# Patient Record
Sex: Male | Born: 1960 | Race: Black or African American | Hispanic: No | Marital: Single | Smoking: Former smoker
Health system: Southern US, Community
[De-identification: ages and names within clinical notes are randomized; demographics above are authoritative.]

## PROBLEM LIST (undated history)

## (undated) DIAGNOSIS — A159 Respiratory tuberculosis unspecified: Secondary | ICD-10-CM

## (undated) DIAGNOSIS — M1990 Unspecified osteoarthritis, unspecified site: Secondary | ICD-10-CM

## (undated) DIAGNOSIS — E669 Obesity, unspecified: Secondary | ICD-10-CM

## (undated) DIAGNOSIS — F17200 Nicotine dependence, unspecified, uncomplicated: Secondary | ICD-10-CM

## (undated) DIAGNOSIS — F1910 Other psychoactive substance abuse, uncomplicated: Secondary | ICD-10-CM

## (undated) DIAGNOSIS — G709 Myoneural disorder, unspecified: Secondary | ICD-10-CM

## (undated) DIAGNOSIS — R51 Headache: Secondary | ICD-10-CM

## (undated) DIAGNOSIS — E785 Hyperlipidemia, unspecified: Secondary | ICD-10-CM

## (undated) DIAGNOSIS — G63 Polyneuropathy in diseases classified elsewhere: Secondary | ICD-10-CM

## (undated) DIAGNOSIS — J45909 Unspecified asthma, uncomplicated: Secondary | ICD-10-CM

## (undated) DIAGNOSIS — I10 Essential (primary) hypertension: Secondary | ICD-10-CM

## (undated) DIAGNOSIS — K219 Gastro-esophageal reflux disease without esophagitis: Secondary | ICD-10-CM

## (undated) DIAGNOSIS — M6530 Trigger finger, unspecified finger: Secondary | ICD-10-CM

## (undated) HISTORY — DX: Myoneural disorder, unspecified: G70.9

## (undated) HISTORY — DX: Hyperlipidemia, unspecified: E78.5

## (undated) HISTORY — DX: Respiratory tuberculosis unspecified: A15.9

## (undated) HISTORY — DX: Other psychoactive substance abuse, uncomplicated: F19.10

## (undated) HISTORY — DX: Polyneuropathy in diseases classified elsewhere: G63

---

## 1999-07-16 ENCOUNTER — Encounter: Admission: RE | Admit: 1999-07-16 | Discharge: 1999-07-16 | Payer: Self-pay | Admitting: Family Medicine

## 1999-08-07 ENCOUNTER — Encounter: Admission: RE | Admit: 1999-08-07 | Discharge: 1999-08-07 | Payer: Self-pay | Admitting: Sports Medicine

## 1999-08-13 ENCOUNTER — Ambulatory Visit (HOSPITAL_COMMUNITY): Admission: RE | Admit: 1999-08-13 | Discharge: 1999-08-13 | Payer: Self-pay | Admitting: *Deleted

## 1999-08-13 ENCOUNTER — Encounter: Payer: Self-pay | Admitting: *Deleted

## 2004-11-03 ENCOUNTER — Emergency Department (HOSPITAL_COMMUNITY): Admission: EM | Admit: 2004-11-03 | Discharge: 2004-11-03 | Payer: Self-pay | Admitting: Family Medicine

## 2005-03-09 ENCOUNTER — Inpatient Hospital Stay (HOSPITAL_COMMUNITY): Admission: AC | Admit: 2005-03-09 | Discharge: 2005-03-12 | Payer: Self-pay

## 2006-03-11 ENCOUNTER — Emergency Department (HOSPITAL_COMMUNITY): Admission: EM | Admit: 2006-03-11 | Discharge: 2006-03-11 | Payer: Self-pay | Admitting: Family Medicine

## 2007-03-27 ENCOUNTER — Emergency Department (HOSPITAL_COMMUNITY): Admission: EM | Admit: 2007-03-27 | Discharge: 2007-03-27 | Payer: Self-pay | Admitting: Family Medicine

## 2007-03-31 ENCOUNTER — Emergency Department (HOSPITAL_COMMUNITY): Admission: EM | Admit: 2007-03-31 | Discharge: 2007-03-31 | Payer: Self-pay | Admitting: Emergency Medicine

## 2007-05-04 ENCOUNTER — Ambulatory Visit: Payer: Self-pay | Admitting: Family Medicine

## 2007-05-04 ENCOUNTER — Encounter (INDEPENDENT_AMBULATORY_CARE_PROVIDER_SITE_OTHER): Payer: Self-pay | Admitting: Nurse Practitioner

## 2007-05-05 ENCOUNTER — Encounter: Payer: Self-pay | Admitting: Nurse Practitioner

## 2007-05-05 ENCOUNTER — Encounter (INDEPENDENT_AMBULATORY_CARE_PROVIDER_SITE_OTHER): Payer: Self-pay | Admitting: Nurse Practitioner

## 2007-05-05 DIAGNOSIS — Z87448 Personal history of other diseases of urinary system: Secondary | ICD-10-CM | POA: Insufficient documentation

## 2007-05-05 DIAGNOSIS — S6980XA Other specified injuries of unspecified wrist, hand and finger(s), initial encounter: Secondary | ICD-10-CM | POA: Insufficient documentation

## 2007-05-05 DIAGNOSIS — S6990XA Unspecified injury of unspecified wrist, hand and finger(s), initial encounter: Secondary | ICD-10-CM | POA: Insufficient documentation

## 2007-05-05 DIAGNOSIS — K219 Gastro-esophageal reflux disease without esophagitis: Secondary | ICD-10-CM

## 2007-05-05 DIAGNOSIS — M6530 Trigger finger, unspecified finger: Secondary | ICD-10-CM

## 2007-05-05 HISTORY — DX: Gastro-esophageal reflux disease without esophagitis: K21.9

## 2007-05-05 HISTORY — DX: Trigger finger, unspecified finger: M65.30

## 2007-05-05 LAB — CONVERTED CEMR LAB
ALT: 18 units/L (ref 0–53)
AST: 17 units/L (ref 0–37)
BUN: 11 mg/dL (ref 6–23)
CO2: 26 meq/L (ref 19–32)
Eosinophils Absolute: 0.2 10*3/uL (ref 0.0–0.7)
Eosinophils Relative: 2 % (ref 0–5)
Glucose, Bld: 111 mg/dL — ABNORMAL HIGH (ref 70–99)
MCHC: 30.5 g/dL (ref 30.0–36.0)
MCV: 92.6 fL (ref 78.0–100.0)
Monocytes Absolute: 0.9 10*3/uL — ABNORMAL HIGH (ref 0.2–0.7)
Neutro Abs: 4.2 10*3/uL (ref 1.7–7.7)
Neutrophils Relative %: 48 % (ref 43–77)
Platelets: 264 10*3/uL (ref 150–400)
RDW: 15.8 % — ABNORMAL HIGH (ref 11.5–14.0)
Total Bilirubin: 0.5 mg/dL (ref 0.3–1.2)
Total Protein: 7.8 g/dL (ref 6.0–8.3)

## 2007-08-20 ENCOUNTER — Ambulatory Visit: Payer: Self-pay | Admitting: *Deleted

## 2008-06-16 ENCOUNTER — Telehealth (INDEPENDENT_AMBULATORY_CARE_PROVIDER_SITE_OTHER): Payer: Self-pay | Admitting: Nurse Practitioner

## 2008-09-07 ENCOUNTER — Ambulatory Visit: Payer: Self-pay | Admitting: Nurse Practitioner

## 2008-09-15 DIAGNOSIS — E119 Type 2 diabetes mellitus without complications: Secondary | ICD-10-CM | POA: Insufficient documentation

## 2008-09-20 ENCOUNTER — Ambulatory Visit: Payer: Self-pay | Admitting: Nurse Practitioner

## 2008-11-04 ENCOUNTER — Ambulatory Visit: Payer: Self-pay | Admitting: Family Medicine

## 2008-11-29 ENCOUNTER — Telehealth (INDEPENDENT_AMBULATORY_CARE_PROVIDER_SITE_OTHER): Payer: Self-pay | Admitting: *Deleted

## 2008-12-02 ENCOUNTER — Encounter (INDEPENDENT_AMBULATORY_CARE_PROVIDER_SITE_OTHER): Payer: Self-pay | Admitting: *Deleted

## 2008-12-12 ENCOUNTER — Telehealth (INDEPENDENT_AMBULATORY_CARE_PROVIDER_SITE_OTHER): Payer: Self-pay | Admitting: Nurse Practitioner

## 2008-12-23 ENCOUNTER — Ambulatory Visit: Payer: Self-pay | Admitting: Nurse Practitioner

## 2008-12-23 DIAGNOSIS — E669 Obesity, unspecified: Secondary | ICD-10-CM

## 2008-12-23 DIAGNOSIS — J45909 Unspecified asthma, uncomplicated: Secondary | ICD-10-CM | POA: Insufficient documentation

## 2008-12-23 DIAGNOSIS — F17200 Nicotine dependence, unspecified, uncomplicated: Secondary | ICD-10-CM

## 2008-12-23 HISTORY — DX: Unspecified asthma, uncomplicated: J45.909

## 2008-12-23 HISTORY — DX: Nicotine dependence, unspecified, uncomplicated: F17.200

## 2008-12-23 LAB — CONVERTED CEMR LAB: Blood Glucose, Fingerstick: 111

## 2009-01-05 ENCOUNTER — Telehealth (INDEPENDENT_AMBULATORY_CARE_PROVIDER_SITE_OTHER): Payer: Self-pay | Admitting: Nurse Practitioner

## 2009-01-27 ENCOUNTER — Ambulatory Visit: Payer: Self-pay | Admitting: Nurse Practitioner

## 2009-01-27 LAB — CONVERTED CEMR LAB
Blood Glucose, AC Bkfst: 91 mg/dL
Glucose, Urine, Semiquant: NEGATIVE
Hgb A1c MFr Bld: 6.9 %
Specific Gravity, Urine: 1.015

## 2009-01-30 ENCOUNTER — Encounter (INDEPENDENT_AMBULATORY_CARE_PROVIDER_SITE_OTHER): Payer: Self-pay | Admitting: Nurse Practitioner

## 2009-01-30 DIAGNOSIS — E785 Hyperlipidemia, unspecified: Secondary | ICD-10-CM | POA: Insufficient documentation

## 2009-01-30 LAB — CONVERTED CEMR LAB
Cholesterol: 159 mg/dL (ref 0–200)
LDL Cholesterol: 98 mg/dL (ref 0–99)
Microalb, Ur: 0.77 mg/dL (ref 0.00–1.89)
PSA: 0.33 ng/mL (ref 0.10–4.00)
Total CHOL/HDL Ratio: 3.6

## 2009-02-21 ENCOUNTER — Telehealth (INDEPENDENT_AMBULATORY_CARE_PROVIDER_SITE_OTHER): Payer: Self-pay | Admitting: Nurse Practitioner

## 2009-02-24 ENCOUNTER — Telehealth (INDEPENDENT_AMBULATORY_CARE_PROVIDER_SITE_OTHER): Payer: Self-pay | Admitting: Nurse Practitioner

## 2009-06-28 ENCOUNTER — Observation Stay (HOSPITAL_COMMUNITY): Admission: EM | Admit: 2009-06-28 | Discharge: 2009-06-29 | Payer: Self-pay | Admitting: Emergency Medicine

## 2009-06-28 ENCOUNTER — Emergency Department (HOSPITAL_COMMUNITY): Admission: EM | Admit: 2009-06-28 | Discharge: 2009-06-28 | Payer: Self-pay | Admitting: Emergency Medicine

## 2009-06-28 ENCOUNTER — Ambulatory Visit: Payer: Self-pay | Admitting: Cardiovascular Disease

## 2009-06-28 ENCOUNTER — Telehealth (INDEPENDENT_AMBULATORY_CARE_PROVIDER_SITE_OTHER): Payer: Self-pay | Admitting: Nurse Practitioner

## 2009-06-29 ENCOUNTER — Encounter (INDEPENDENT_AMBULATORY_CARE_PROVIDER_SITE_OTHER): Payer: Self-pay | Admitting: Emergency Medicine

## 2010-01-17 ENCOUNTER — Emergency Department (HOSPITAL_COMMUNITY): Admission: EM | Admit: 2010-01-17 | Discharge: 2010-01-17 | Payer: Self-pay | Admitting: Emergency Medicine

## 2010-02-07 ENCOUNTER — Ambulatory Visit: Payer: Self-pay | Admitting: Nurse Practitioner

## 2010-02-07 DIAGNOSIS — R51 Headache: Secondary | ICD-10-CM | POA: Insufficient documentation

## 2010-02-07 DIAGNOSIS — R519 Headache, unspecified: Secondary | ICD-10-CM | POA: Insufficient documentation

## 2010-02-07 DIAGNOSIS — K029 Dental caries, unspecified: Secondary | ICD-10-CM | POA: Insufficient documentation

## 2010-02-07 LAB — CONVERTED CEMR LAB
AST: 14 units/L (ref 0–37)
Albumin: 3.7 g/dL (ref 3.5–5.2)
Basophils Absolute: 0 10*3/uL (ref 0.0–0.1)
Basophils Relative: 0 % (ref 0–1)
Blood Glucose, Fingerstick: 121
Chloride: 107 meq/L (ref 96–112)
Glucose, Bld: 83 mg/dL (ref 70–99)
HCT: 41.2 % (ref 39.0–52.0)
Hgb A1c MFr Bld: 6.2 %
LDL Cholesterol: 69 mg/dL (ref 0–99)
Lymphocytes Relative: 35 % (ref 12–46)
Monocytes Absolute: 1.1 10*3/uL — ABNORMAL HIGH (ref 0.1–1.0)
PSA: 0.22 ng/mL (ref 0.10–4.00)
Potassium: 3.7 meq/L (ref 3.5–5.3)
Rapid HIV Screen: NEGATIVE
Sed Rate: 15 mm/hr (ref 0–16)
TSH: 0.714 microintl units/mL (ref 0.350–4.500)
Total CHOL/HDL Ratio: 2.8
Triglycerides: 137 mg/dL (ref <150)
VLDL: 27 mg/dL (ref 0–40)
WBC: 9.7 10*3/uL (ref 4.0–10.5)

## 2010-02-08 ENCOUNTER — Encounter (INDEPENDENT_AMBULATORY_CARE_PROVIDER_SITE_OTHER): Payer: Self-pay | Admitting: Nurse Practitioner

## 2010-03-02 ENCOUNTER — Ambulatory Visit: Payer: Self-pay | Admitting: Nurse Practitioner

## 2010-03-02 DIAGNOSIS — L989 Disorder of the skin and subcutaneous tissue, unspecified: Secondary | ICD-10-CM | POA: Insufficient documentation

## 2010-03-24 ENCOUNTER — Emergency Department (HOSPITAL_COMMUNITY): Admission: EM | Admit: 2010-03-24 | Discharge: 2010-03-24 | Payer: Self-pay | Admitting: Family Medicine

## 2010-05-07 ENCOUNTER — Encounter (INDEPENDENT_AMBULATORY_CARE_PROVIDER_SITE_OTHER): Payer: Self-pay | Admitting: Nurse Practitioner

## 2010-05-07 ENCOUNTER — Telehealth (INDEPENDENT_AMBULATORY_CARE_PROVIDER_SITE_OTHER): Payer: Self-pay | Admitting: Nurse Practitioner

## 2010-06-06 ENCOUNTER — Ambulatory Visit: Payer: Self-pay | Admitting: Nurse Practitioner

## 2010-06-06 LAB — CONVERTED CEMR LAB
Glucose, Urine, Semiquant: NEGATIVE
Hgb A1c MFr Bld: 6 %
Protein, U semiquant: NEGATIVE
Specific Gravity, Urine: 1.02
pH: 5

## 2010-09-05 ENCOUNTER — Telehealth (INDEPENDENT_AMBULATORY_CARE_PROVIDER_SITE_OTHER): Payer: Self-pay | Admitting: Nurse Practitioner

## 2010-09-05 ENCOUNTER — Telehealth (INDEPENDENT_AMBULATORY_CARE_PROVIDER_SITE_OTHER): Payer: Self-pay | Admitting: *Deleted

## 2010-10-14 LAB — CONVERTED CEMR LAB
ALT: 11 units/L (ref 0–53)
AST: 11 units/L (ref 0–37)
Alkaline Phosphatase: 109 units/L (ref 39–117)
Basophils Absolute: 0 10*3/uL (ref 0.0–0.1)
Basophils Relative: 0 % (ref 0–1)
Chloride: 100 meq/L (ref 96–112)
Hgb A1c MFr Bld: 7.1 % — ABNORMAL HIGH (ref 4.6–6.1)
Lymphs Abs: 4.4 10*3/uL — ABNORMAL HIGH (ref 0.7–4.0)
MCHC: 32 g/dL (ref 30.0–36.0)
Monocytes Relative: 9 % (ref 3–12)
Total Bilirubin: 0.5 mg/dL (ref 0.3–1.2)

## 2010-10-16 NOTE — Letter (Signed)
Summary: *HSN Results Follow up  [facility]  [address] [place], [place] [address]   Phone: [phone]  Fax: [phone]      02/08/2010   [facility] RAWL [address] [place], [place]  [address]   Dear  Mr. Lucan Carranco,                            ____S.Drinkard,FNP   ____D. Gore,FNP       ____B. McPherson,MD   ____V. Rankins,MD    ____E. Mulberry,MD    _X___N. Daphine Deutscher, FNP  ____D. Reche Dixon, MD    ____K. Philipp Deputy, MD    ____Other     This letter is to inform you that your recent test(s):  _______Pap Smear    ___X____Lab Test     _______X-ray    ___X____ is within acceptable limits  _______ requires a medication change  _______ requires a follow-up lab visit  _______ requires a follow-up visit with your provider   Comments: labs done during recent office visit are normal       _________________________________________________________ If you have any questions, please contact our office [phone].                    Sincerely,    Lehman Prom FNP [facility]

## 2010-10-16 NOTE — Letter (Signed)
Summary: *Referral Letter  [facility]  [address] [place], [place] [address]   Phone: [phone]  Fax: [phone]    05/07/2010  [name] [address] [place], [place]  [address]  Phone: [phone]  To whom it may concern:  See below for Miguel Brady's medical history.  Of note, is reactive airway disease which may be triggered by prolonged and continuous physical activity.  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) 13)  INJURY NOS, FINGER (ICD-959.5) 14)  TRIGGER FINGER (ICD-727.03)   Current Medications: 1)  LISINOPRIL 20 MG  TABS (LISINOPRIL) 1 tablet by mouth daily 2)  PROTONIX 40 MG  TBEC (PANTOPRAZOLE SODIUM) 1 tablet by mouth daily 3)  METFORMIN HCL 500 MG TABS (METFORMIN HCL) 1 tablet by mouth two times a day with meals 4)  PROAIR HFA 108 (90 BASE) MCG/ACT AERS (ALBUTEROL SULFATE) 2 puffs every 6 hours as needed for shortness of breath 5)  PRAVASTATIN SODIUM 10 MG TABS (PRAVASTATIN SODIUM) HOLD 6)  ASPIRIN EC LOW STRENGTH 81 MG TBEC (ASPIRIN) One tablet by mouth daily for circulation 7)  NAPROXEN 500 MG TABS (NAPROXEN) One tablet by mouth two times a day as needed for headache  Please contact us if you have any further questions or need additional information.  Sincerely,    Lehman Prom FNP [facility]

## 2010-10-16 NOTE — Assessment & Plan Note (Signed)
Summary: Complete Physical Exam   Vital Signs:  Patient profile:   50 year old male Weight:      256.9 pounds Temp:     97.9 degrees F oral Pulse rate:   88 / minute Pulse rhythm:   regular Resp:     20 per minute BP sitting:   122 / 88  (left arm) Cuff size:   large  Vitals Entered By: Levon Hedger (June 06, 2010 3:55 PM) CC: CPE....pt has increased his HCTZ twice a day., Lipid Management, Abdominal Pain Pain Assessment Patient in pain? no      CBG Result 124 CBG Device ID B  Does patient need assistance? Functional Status Self care Ambulation Normal Comments pt did not bring medication, but did bring medication list   CC:  CPE....pt has increased his HCTZ twice a day., Lipid Management, and Abdominal Pain.  History of Present Illness:  Pt into the office for a complete physical exam  PT DID NOT BRING HIS MEDICATIONS INTO THE OFFICE - advised pt to bring to the office pt reports that he is taking HCTZ although it was d/c in 02/2010.  Pt reports "I feel better when I take 2 tablets daily.  Optho - no recent eye exam. Pt does wear glasses  Dental - no recent dental exam  Dyspepsia History:      He has no alarm features of dyspepsia including no history of melena, hematochezia, dysphagia, persistent vomiting, or involuntary weight loss > 5%.  There is a prior history of GERD.  The patient does not have a prior history of documented ulcer disease.  The dominant symptom is heartburn or acid reflux.  An H-2 blocker medication is not currently being taken.    Lipid Management History:      Positive NCEP/ATP III risk factors include male age 17 years old or older, diabetes, current tobacco user, and hypertension.  Negative NCEP/ATP III risk factors include no ASHD (atherosclerotic heart disease), no prior stroke/TIA, no peripheral vascular disease, and no history of aortic aneurysm.        The patient states that he does not know about the "Therapeutic Lifestyle  Change" diet.  Comments include: Pt is not taking the pravachol as ordered.  he reports some side effects.    Diabetic Foot Exam Foot Inspection Is there a history of a foot ulcer?              No Is there a foot ulcer now?              No Can the patient see the bottom of their feet?          No Are the shoes appropriate in style and fit?          Yes Is there swelling or an abnormal foot shape?          No Are the toenails long?                No Are the toenails thick?                No Are the toenails ingrown?              No Is there heavy callous build-up?              No Is there pain in the calf muscle (Intermittent claudication) when walking?    NoIs there a claw toe deformity?  No Is there elevated skin temperature?            No Is there limited ankle dorsiflexion?            No Is there foot or ankle muscle weakness?            No  Diabetic Foot Care Education Patient educated on appropriate care of diabetic feet.  Pulse Check          Right Foot          Left Foot Dorsalis Pedis:        normal            normal     Habits & Providers  Alcohol-Tobacco-Diet     Alcohol drinks/day: 0     Alcohol Counseling: not indicated; patient does not drink     Tobacco Status: current     Tobacco Counseling: to quit use of tobacco products     Cigarette Packs/Day: 0.5     Year Started: age 30  Exercise-Depression-Behavior     Does Patient Exercise: no     Drug Use: never  Allergies (verified): 1)  ! * Clonidine 2)  ! Penicillin 3)  ! * Bee Stings 4)  ! * Wasps 5)  ! * Shellfish  Review of Systems General:  Denies fever. Eyes:  Denies blurring. ENT:  Denies earache. CV:  Denies chest pain or discomfort. Resp:  Denies cough. GI:  Complains of abdominal pain. GU:  Denies discharge. MS:  Denies joint pain. Derm:  Denies dryness. Neuro:  Denies headaches. Psych:  Denies anxiety and depression. Endo:  Denies excessive urination.  Physical  Exam  General:  alert.   Head:  normocephalic.   Eyes:  glasses left strabismus Ears:  bil tm with bony landmarks present Nose:  no nasal discharge.   Mouth:  fair dentition.   Neck:  supple.   Chest Wall:  no masses.   Breasts:  no gynecomastia.   Lungs:  normal breath sounds.   Heart:  normal rate and regular rhythm.   Abdomen:  soft, non-tender, and normal bowel sounds.   Rectal:  no external abnormalities.   Prostate:  no gland enlargement.   Msk:  up to the exam table Pulses:  R radial normal and L radial normal.   Extremities:  no edema Neurologic:  alert & oriented X3.   Skin:  color normal.   Psych:  Oriented X3.     Impression & Recommendations:  Problem # 1:  PHYSICAL EXAMINATION (ICD-V70.0) rectal and prostate normal advised pt to get an eye exam labs done on previous visit Orders: Hemoccult Guaiac-1 spec.(in office) ([address]) UA Dipstick w/o Micro (manual) ([address])  Problem # 2:  TOBACCO ABUSE (ICD-305.1) advised cessation  Problem # 3:  DIABETES MELLITUS (ICD-250.00)  His updated medication list for this problem includes:    Lisinopril 20 Mg Tabs (Lisinopril) .[name]... 1 tablet by mouth daily    Metformin Hcl 500 Mg Tabs (Metformin hcl) .[name]... 1 tablet by mouth two times a day with meals    Aspirin Ec Low Strength 81 Mg Tbec (Aspirin) ..... One tablet by mouth daily for circulation  Orders: EKG w/ Interpretation (93000) Capillary Blood Glucose/CBG (82948) Hemoglobin A1C (83036) Capillary Blood Glucose/CBG (60454)  Problem # 4:  HYPERLIPIDEMIA (ICD-272.4)  His updated medication list for this problem includes:    Pravastatin Sodium 10 Mg Tabs (Pravastatin sodium) ..... Hold  Complete Medication List: 1)  Lisinopril 20 Mg Tabs (  Lisinopril) .[name].. 1 tablet by mouth daily 2)  Protonix 40 Mg Tbec (Pantoprazole sodium) .[name].. 1 tablet by mouth daily 3)  Metformin Hcl 500 Mg Tabs (Metformin hcl) .[name].. 1 tablet by mouth two times a day with meals 4)  Proair Hfa 108  (90 Base) Mcg/act Aers (Albuterol sulfate) .... 2 puffs every 6 hours as needed for shortness of breath 5)  Pravastatin Sodium 10 Mg Tabs (Pravastatin sodium) .... Hold 6)  Aspirin Ec Low Strength 81 Mg Tbec (Aspirin) .... One tablet by mouth daily for circulation 7)  Naproxen 500 Mg Tabs (Naproxen) .... One tablet by mouth two times a day as needed for headache  Lipid Assessment/Plan:      Based on NCEP/ATP III, the patient's risk factor category is "history of diabetes".  The patient's lipid goals are as follows: Total cholesterol goal is 200; LDL cholesterol goal is 100; HDL cholesterol goal is 40; Triglyceride goal is 150.     Patient Instructions: 1)  Follow up in 1 month for flu vaccine 2)  Follow up with n.martin,fnp for diabetes. 3)  Your medications will be sent to the pharmacy Prescriptions: METFORMIN HCL 500 MG TABS (METFORMIN HCL) 1 tablet by mouth two times a day with meals  #60 x 5   Entered and Authorized by:   Lehman Prom FNP   Signed by:   Lehman Prom FNP on 06/06/2010   Method used:   Faxed to ...       [facility] (retail)       [address] [place], [place]  [address]       Ph: [phone]       Fax: [phone]   RxID:   [id number]   Laboratory Results   Urine Tests  Date/Time Received: June 06, 2010 4:07 PM   Routine Urinalysis   Color: lt. yellow Appearance: Clear Glucose: negative   (Normal Range: Negative) Bilirubin: negative   (Normal Range: Negative) Ketone: negative   (Normal Range: Negative) Spec. Gravity: 1.020   (Normal Range: 1.003-1.035) Blood: small   (Normal Range: Negative) pH: 5.0   (Normal Range: 5.0-8.0) Protein: negative   (Normal Range: Negative) Urobilinogen: 0.2   (Normal Range: 0-1) Nitrite: negative   (Normal Range: Negative) Leukocyte Esterace: negative   (Normal Range: Negative)     Blood Tests   Date/Time Received: June 06, 2010 5:03 PM   HGBA1C:  6.0%   (Normal Range: Non-Diabetic - 3-6%   Control Diabetic - 6-8%) CBG Random:: 124    Stool - Occult Blood Hemmoccult #1: negative Date: 06/07/2010    Laboratory Results   Urine Tests    Routine Urinalysis   Color: lt. yellow Appearance: Clear Glucose: negative   (Normal Range: Negative) Bilirubin: negative   (Normal Range: Negative) Ketone: negative   (Normal Range: Negative) Spec. Gravity: 1.020   (Normal Range: 1.003-1.035) Blood: small   (Normal Range: Negative) pH: 5.0   (Normal Range: 5.0-8.0) Protein: negative   (Normal Range: Negative) Urobilinogen: 0.2   (Normal Range: 0-1) Nitrite: negative   (Normal Range: Negative) Leukocyte Esterace: negative   (Normal Range: Negative)     Blood Tests     HGBA1C: 6.0%   (Normal Range: Non-Diabetic - 3-6%   Control Diabetic - 6-8%) CBG Random:: 124mg /dL

## 2010-10-16 NOTE — Progress Notes (Signed)
Summary: physical limitations letter for work  Phone Note Call from Patient   Summary of Call: NEED A NOTE STATING HIS MEDICAL HISTORY FOR SCHOOL WILL HAVE TO JOIN [employer] AND THEY NEED TO KNOW HE GETS SHORT OF BREATH///NEED BEFORE WED. PHONE [phone]   IIF HE DODESN'T GET A NOTE BY WED ,HE LOSES ALL HIS MONEY Initial call taken by: Arta Bruce,  May 07, 2010 3:49 PM  Follow-up for Phone Call        forward to N. Daphine Deutscher, fnp Follow-up by: Levon Hedger,  May 07, 2010 4:39 PM  Additional Follow-up for Phone Call Additional follow up Details #1::        letter stating pt's medical history in basket Additional Follow-up by: Lehman Prom FNP,  May 07, 2010 5:12 PM    Additional Follow-up for Phone Call Additional follow up Details #2::    Pt. notified letter ready to be picked up. Gaylyn Cheers RN  May 08, 2010 12:48 PM

## 2010-10-16 NOTE — Assessment & Plan Note (Signed)
Summary: Headaches   Vital Signs:  Patient profile:   50 year old male Weight:      256.5 pounds BMI:     36.67 Temp:     97.9 degrees F oral Pulse rate:   83 / minute Pulse rhythm:   regular Resp:     20 per minute BP sitting:   133 / 94  (left arm) Cuff size:   large  Vitals Entered By: Levon Hedger (March 02, 2010 2:15 PM) CC: pt has bumps on lower abdomen x 8 months...pt is complaining of cramps in his legs and feet, Lipid Management, Headache, Hypertension Management Is Patient Diabetic? Yes Pain Assessment Patient in pain? no      CBG Result 99 CBG Device ID B  Does patient need assistance? Functional Status Self care Ambulation Normal   CC:  pt has bumps on lower abdomen x 8 months...pt is complaining of cramps in his legs and feet, Lipid Management, Headache, and Hypertension Management.  History of Present Illness:  Pt into the office for  4 week f/u - headache Pt reports that his headache have improved Pt was given naprosyn as needed for headache.   Pt did not decrease his tobacco abuse. No episodes of headaches as described in last visit   Abominal problems - Sores present on his stomach for the past 8 months Pt admits that he spontaneously bursts that makes them spontanously report  Headache HPI:      The location of the headaches are bilateral.  Headache quality is sharp (knife-like).         Hypertension History:      Pt STOPPED taking HCTZ due to cramping.        Positive major cardiovascular risk factors include male age 45 years old or older, diabetes, hyperlipidemia, hypertension, and current tobacco user.        Further assessment for target organ damage reveals no history of ASHD, stroke/TIA, or peripheral vascular disease.    Lipid Management History:      Positive NCEP/ATP III risk factors include male age 38 years old or older, diabetes, current tobacco user, and hypertension.  Negative NCEP/ATP III risk factors include no ASHD  (atherosclerotic heart disease), no prior stroke/TIA, no peripheral vascular disease, and no history of aortic aneurysm.        The patient states that he does not know about the "Therapeutic Lifestyle Change" diet.  Adjunctive measures started by the patient include ASA.  Comments include: Pt is NOT taking medications as ordered.       Habits & Providers  Alcohol-Tobacco-Diet     Alcohol drinks/day: 0     Alcohol Counseling: not indicated; patient does not drink     Tobacco Status: current     Tobacco Counseling: to quit use of tobacco products     Cigarette Packs/Day: 0.5     Year Started: age 26  Exercise-Depression-Behavior     Does Patient Exercise: no     Drug Use: never  Allergies: 1)  ! * Clonidine 2)  ! Penicillin 3)  ! * Bee Stings 4)  ! * Wasps 5)  ! * Shellfish  Social History: Packs/Day:  0.5  Review of Systems CV:  Denies chest pain or discomfort. Resp:  Denies cough. GI:  Denies abdominal pain, nausea, and vomiting. GU:  Complains of erectile dysfunction. Derm:  Complains of lesion(s).  Physical Exam  General:  alert.   Head:  normocephalic.   Lungs:  normal breath  sounds.   Heart:  normal rate and regular rhythm.   Abdomen:  normal bowel sounds.   Msk:  up to the  Neurologic:  alert & oriented X3.   Skin:  abdomen  - under pannus, multiple hyperpigmented lesions Psych:  Oriented X3.    Diabetes Management Exam:    Foot Exam (with socks and/or shoes not present):       Sensory-Monofilament:          Left foot: normal          Right foot: normal   Impression & Recommendations:  Problem # 1:  HEADACHE (ICD-784.0) Headaches have improved His updated medication list for this problem includes:    Aspirin Ec Low Strength 81 Mg Tbec (Aspirin) ..... One tablet by mouth daily for circulation    Naproxen 500 Mg Tabs (Naproxen) ..... One tablet by mouth two times a day as needed for headache  Problem # 2:  TOBACCO ABUSE (ICD-305.1) advised pt to  quit smoking  Problem # 3:  HYPERLIPIDEMIA (ICD-272.4) no current medications, stable His updated medication list for this problem includes:    Pravastatin Sodium 10 Mg Tabs (Pravastatin sodium) ..... Hold  Problem # 4:  DIABETES MELLITUS (ICD-250.00) controlled at this time His updated medication list for this problem includes:    Lisinopril 20 Mg Tabs (Lisinopril) .[name]... 1 tablet by mouth daily    Metformin Hcl 500 Mg Tabs (Metformin hcl) .[name]... 1 tablet by mouth two times a day with meals    Aspirin Ec Low Strength 81 Mg Tbec (Aspirin) ..... One tablet by mouth daily for circulation  Orders: Capillary Blood Glucose/CBG (08657)  Problem # 5:  SKIN LESION (ICD-709.9) advised pt to apply hibiclens to affected area  Complete Medication List: 1)  Lisinopril 20 Mg Tabs (Lisinopril) .[name].. 1 tablet by mouth daily 2)  Protonix 40 Mg Tbec (Pantoprazole sodium) .[name].. 1 tablet by mouth daily 3)  Metformin Hcl 500 Mg Tabs (Metformin hcl) .[name].. 1 tablet by mouth two times a day with meals 4)  Proair Hfa 108 (90 Base) Mcg/act Aers (Albuterol sulfate) .... 2 puffs every 6 hours as needed for shortness of breath 5)  Pravastatin Sodium 10 Mg Tabs (Pravastatin sodium) .... Hold 6)  Aspirin Ec Low Strength 81 Mg Tbec (Aspirin) .... One tablet by mouth daily for circulation 7)  Naproxen 500 Mg Tabs (Naproxen) .... One tablet by mouth two times a day as needed for headache 8)  Hibiclens 4 % Liqd (Chlorhexidine gluconate) .... One application topically three weekly to affected 9)  Bactroban 2 % Oint (Mupirocin) .... One application topically two times a day to affected area  Hypertension Assessment/Plan:      The patient's hypertensive risk group is category C: Target organ damage and/or diabetes.  His calculated 10 year risk of coronary heart disease is 11 %.  Today's blood pressure is 133/94.  His blood pressure goal is < 130/80.  Lipid Assessment/Plan:      Based on NCEP/ATP III, the patient's risk  factor category is "history of diabetes".  The patient's lipid goals are as follows: Total cholesterol goal is 200; LDL cholesterol goal is 100; HDL cholesterol goal is 40; Triglyceride goal is 150.    Patient Instructions: 1)  Keep up efforts to quit smoking. 2)  Headaches - glad headaches are improved 3)  Sores on stomach - most likely from overgrowth staph 4)  Use the HIbilens to affected area - apply and let sit for 5  minutes then rinse. 5)  Apply bactroban ointment to affected areas two times a day  6)  Apply warm compresses to the area when inflammed - DO NOT POP Prescriptions: BACTROBAN 2 % OINT (MUPIROCIN) One application topically two times a day to affected area  #60gm x 1   Entered and Authorized by:   Lehman Prom FNP   Signed by:   Lehman Prom FNP on 03/02/2010   Method used:   Faxed to ...       [facility] - [facility] (retail)       [address] [place], [place]  [address]       Ph: [phone]       Fax: [phone]   RxID:   [id number] HIBICLENS 4 % LIQD (CHLORHEXIDINE GLUCONATE) One application topically three weekly to affected  #259ml x 0   Entered and Authorized by:   Lehman Prom FNP   Signed by:   Lehman Prom FNP on 03/02/2010   Method used:   Faxed to ...       [facility] - [facility] (retail)       [address] [place], [place]  [address]       Ph: [phone]       Fax: [phone]   RxID:   [id number]   Last LDL:                                                 69 (02/07/2010 9:35:00 PM)        Diabetic Foot Exam    10-g (5.07) Semmes-Weinstein Monofilament Test Performed by: Levon Hedger          Right Foot          Left Foot Visual Inspection               Test Control      normal         normal Site 1         normal         normal Site 2         normal         normal Site 3         normal         normal Site 4         normal          normal Site 5         normal         normal Site 6         normal         normal Site 7         normal         normal Site 8         normal         normal Site 9         normal         normal Site 10         normal         normal  Impression      normal         normal  Prevention & Chronic Care Immunizations   Influenza vaccine:  advised yearly flu vaccine in the fall  (12/23/2008)    Tetanus booster: 01/27/2009: Tdap    Pneumococcal vaccine: Pneumovax  (12/23/2008)  Other Screening   PSA: 0.22  (02/07/2010)   Smoking status: current  (03/02/2010)  Diabetes Mellitus   HgbA1C: 6.2  (02/07/2010)    Eye exam: Not documented    Foot exam: yes  (03/02/2010)   High risk foot: No  (01/27/2009)   Foot care education: Done  (02/07/2010)    Urine microalbumin/creatinine ratio: Not documented  Lipids   Total Cholesterol: 148  (02/07/2010)   LDL: 69  (02/07/2010)   LDL Direct: Not documented   HDL: 52  (02/07/2010)   Triglycerides: 137  (02/07/2010)    SGOT (AST): 14  (02/07/2010)   SGPT (ALT): 12  (02/07/2010)   Alkaline phosphatase: 89  (02/07/2010)   Total bilirubin: 0.4  (02/07/2010)  Self-Management Support :    Diabetes self-management support: Not documented    Lipid self-management support: Not documented    Laboratory Results  Comments: Pt unable to void  Blood Tests     CBG Random:: 99mg /dL

## 2010-10-16 NOTE — Assessment & Plan Note (Signed)
Summary: Diabetes/HTN   Vital Signs:  Patient profile:   50 year old male Height:      70.25 inches Weight:      256.8 pounds BMI:     36.72 BSA:     2.33 Temp:     97.4 degrees F oral Pulse rate:   78 / minute Pulse rhythm:   regular Resp:     20 per minute BP sitting:   136 / 91  (left arm) Cuff size:   large  Vitals Entered By: Levon Hedger (Feb 07, 2010 8:37 AM)  Nutrition Counseling: Patient's BMI is greater than 25 and therefore counseled on weight management options. CC: Lipid Management, Headache CBG Result 121 CBG Device ID B  Does patient need assistance? Functional Status Self care Ambulation Normal   CC:  Lipid Management and Headache.  History of Present Illness:  Pt into the office with complaints of headache Current headaches started 2 weeks ago. Noted that during 2 separate occsasions of intimacy and climax he got a shooting headache in the center of his head, lasted for about 5 minute then resolved. Vision is not affected during the episodes.  Currently wears glasses but no recent eye exam +dizziness Rest resolves the symptoms Also notices that symptoms occur when he gets mad   Obesity - pt has lost 13 pounds since his last visit pt takes a nutrition class at school and he is learning lots about nutrition He is also taking fitness class in school  Social - pt was still in school and plans to graduate next semester.   Diabetes Management History:      The patient is a 50 years old male who comes in for evaluation of DM Type 2.  He states lack of understanding of dietary principles and is not following his diet appropriately.  No sensory loss is reported.  Self foot exams are not being performed.  He is not checking home blood sugars.  He says that he is not exercising regularly.        Hypoglycemic symptoms are not occurring.  No hyperglycemic symptoms are reported.    Headache HPI:      The location of the headaches are bilateral.  Headache  quality is sharp (knife-like).        Positive alarm features include change in severity from prior H/A's.        Additional history: wears glasses but no recent eye exam.    Lipid Management History:      Positive NCEP/ATP III risk factors include male age 23 years old or older, diabetes, current tobacco user, and hypertension.  Negative NCEP/ATP III risk factors include no ASHD (atherosclerotic heart disease) and no prior stroke/TIA.        The patient states that he does not know about the "Therapeutic Lifestyle Change" diet.  The patient does not know about adjunctive measures for cholesterol lowering.  Comments include: Pt is not taking his cholesterol medications .  The patient denies any symptoms to suggest myopathy or liver disease.       Habits & Providers  Alcohol-Tobacco-Diet     Alcohol drinks/day: 0     Alcohol Counseling: not indicated; patient does not drink     Tobacco Status: current     Tobacco Counseling: to quit use of tobacco products     Year Started: age 8  Exercise-Depression-Behavior     Does Patient Exercise: no     Drug Use: never  Allergies: 1)  ! *  Clonidine 2)  ! Penicillin 3)  ! * Bee Stings 4)  ! * Wasps 5)  ! * Shellfish  Review of Systems General:  mouth pain. CV:  Denies chest pain or discomfort. Resp:  Denies cough. GI:  Denies abdominal pain, nausea, and vomiting. Neuro:  Complains of headaches.  Physical Exam  General:  alert.  obese Head:  normocephalic.   Eyes:  glasses Mouth:  left lower molar and upper molar broken and carried teeth Lungs:  normal breath sounds.   Heart:  normal rate and regular rhythm.   Abdomen:  obese Msk:  up to the exam table Neurologic:  alert & oriented X3.   Skin:  color normal.   Psych:  Oriented X3.    Diabetes Management Exam:       Nails:          Left foot: normal          Right foot: normal   Impression & Recommendations:  Problem # 1:  DIABETES MELLITUS (ICD-250.00) well  controlled continue current meds will add ASA to regimen His updated medication list for this problem includes:    Lisinopril 20 Mg Tabs (Lisinopril) .[name]... 1 tablet by mouth daily    Metformin Hcl 500 Mg Tabs (Metformin hcl) .[name]... 1 tablet by mouth two times a day with meals    Aspirin Ec Low Strength 81 Mg Tbec (Aspirin) ..... One tablet by mouth daily for circulation  Orders: Capillary Blood Glucose/CBG (51025) T-Lipid Profile (85277-82423) T-Comprehensive Metabolic Panel [phone]) T-PSA (00867-61950) T-CBC w/Diff (93267-12458) T-TSH (09983-38250)  Problem # 2:  OBESITY (ICD-278.00)  pt has lost 13 pounds since his last visit advised to keep up the good work at weight loss efforts Orders: Rapid HIV  (92370)  Problem # 3:  HYPERLIPIDEMIA (ICD-272.4) pt is not taking cholesterol meds advised pt that he will likely still need cholesterol meds but will check today His updated medication list for this problem includes:    Pravastatin Sodium 10 Mg Tabs (Pravastatin sodium) ..... Hold  Orders: Hemoglobin A1C (83036)  Problem # 4:  TOBACCO ABUSE (ICD-305.1) advised cessation The following medications were removed from the medication list:    Chantix 1 Mg Tabs (Varenicline tartrate) .[name]... 1 tablet by mouth two times a day  Problem # 5:  DENTAL CARIES (ICD-521.00) pt needs a dental appt will treat with clindamycin  Problem # 6:  HEADACHE (ICD-784.0)  ? tension will order nsaid pt advised to quit smoking His updated medication list for this problem includes:    Aspirin Ec Low Strength 81 Mg Tbec (Aspirin) ..... One tablet by mouth daily for circulation    Naproxen 500 Mg Tabs (Naproxen) ..... One tablet by mouth two times a day as needed for headache  Orders: T-Sed Rate (Automated) (53976-73419)  Complete Medication List: 1)  Hydrochlorothiazide 50 Mg Tabs (hydrochlorothiazide)  .... Take one-half by mouth daily 2)  Lisinopril 20 Mg Tabs (Lisinopril) .[name].. 1 tablet by  mouth daily 3)  Protonix 40 Mg Tbec (Pantoprazole sodium) .[name].. 1 tablet by mouth daily 4)  Metformin Hcl 500 Mg Tabs (Metformin hcl) .[name].. 1 tablet by mouth two times a day with meals 5)  Proair Hfa 108 (90 Base) Mcg/act Aers (Albuterol sulfate) .... 2 puffs every 6 hours as needed for shortness of breath 6)  Pravastatin Sodium 10 Mg Tabs (Pravastatin sodium) .... Hold 7)  Clindamycin Hcl 300 Mg Caps (Clindamycin hcl) .... One capsule by mouth four times per day for infection  8)  Aspirin Ec Low Strength 81 Mg Tbec (Aspirin) .... One tablet by mouth daily for circulation 9)  Naproxen 500 Mg Tabs (Naproxen) .... One tablet by mouth two times a day as needed for headache  Diabetes Management Assessment/Plan:      The following lipid goals have been established for the patient: Total cholesterol goal of 200; LDL cholesterol goal of 100; HDL cholesterol goal of 40; Triglyceride goal of 150.  His blood pressure goal is < 130/80.    Lipid Assessment/Plan:      Based on NCEP/ATP III, the patient's risk factor category is "history of diabetes".  The patient's lipid goals are as follows: Total cholesterol goal is 200; LDL cholesterol goal is 100; HDL cholesterol goal is 40; Triglyceride goal is 150.    Diabetic Foot Exam Foot Inspection Is there a history of a foot ulcer?              Yes Is there a foot ulcer now?              No Can the patient see the bottom of their feet?          No Are the shoes appropriate in style and fit?          Yes Is there swelling or an abnormal foot shape?          No Are the toenails long?                No Are the toenails thick?                No Are the toenails ingrown?              No Is there heavy callous build-up?              No Is there pain in the calf muscle (Intermittent claudication) when walking?    NoIs there a claw toe deformity?              No Is there elevated skin temperature?            No Is there limited ankle dorsiflexion?            No Is  there foot or ankle muscle weakness?            No  Diabetic Foot Care Education Patient educated on appropriate care of diabetic feet.  Pulse Check          Right Foot          Left Foot Dorsalis Pedis:        normal            normal   Patient Instructions: 1)  Diabetes - well control 2)  continue current meds 3)  High blood pressure - remember to avoid salty foods 4)  Cholesterol - will recheck labs today.  If still elevated will need to consider another medication 5)  Headache - may be due to constriction of vessels in head 6)  -Decrease smoking 7)  -Start enteric coated baby aspirin daily 8)  -will see what cholesterol level is and adjust medications if needed 9)  Take naprosyn 500mg  by mouth two times a day as needed for headache 10)  Tooth pain - you need to schedule a dental appt to get teeth removed as this may cause abscess. 11)  Will send clindamycin (since you have an allergy to penicillin you can't take amoxil) to [facility] 12)  Follow up in 1 month for headache. 13)  You ill need u/a, microalbumin, prostate and rectal exam Prescriptions: NAPROXEN 500 MG TABS (NAPROXEN) One tablet by mouth two times a day as needed for headache  #50 x 0   Entered and Authorized by:   Lehman Prom FNP   Signed by:   Lehman Prom FNP on 02/07/2010   Method used:   Faxed to ...       [facility] (retail)       [address] [place], [place]  [address]       Ph: [phone]       Fax: [phone]   RxID:   [id number] CLINDAMYCIN HCL 300 MG CAPS (CLINDAMYCIN HCL) One capsule by mouth four times per day for infection  #40 x 0   Entered and Authorized by:   Lehman Prom FNP   Signed by:   Lehman Prom FNP on 02/07/2010   Method used:   Faxed to ...       [facility] (retail)       [address] [place], [place]  [address]       Ph: [phone]       Fax:  [phone]   RxID:   [id number]   Laboratory Results   Blood Tests   Date/Time Received: Feb 07, 2010 9:15 AM   HGBA1C: 6.2%   (Normal Range: Non-Diabetic - 3-6%   Control Diabetic - 6-8%) CBG Random:: 121mg /dL  Date/Time Received: Feb 07, 2010   Other Tests  Rapid HIV: negative

## 2010-10-18 NOTE — Progress Notes (Signed)
Summary: meds refill  Phone Note Call from Patient   Summary of Call: please call patien he did't get his medicine orange card exp. he wants to to if you can faxed to [facility] [address]  please call him at [phone] Initial call taken by: Domenic Polite,  September 05, 2010 3:54 PM     Appended Document: Needs generic meds Prescriptions: RANITIDINE HCL 150 MG TABS (RANITIDINE HCL) One tablet by mouth two times a day for stomach  #60 x 3   Entered and Authorized by:   Lehman Prom FNP   Signed by:   Lehman Prom FNP on 09/05/2010   Method used:   Printed then faxed to ...       [facility] [address] [phone]* (retail)       [address]       [place], [place]  [address]       Ph: [phone]       Fax: [phone]   RxID:   [id number]  Pt. called [place] [facility] and was told his card expired in September -- unable to get meds at that pharmacy.  Refills cancelled at [facility] pharmacy.  Needs Protonix changed to something generic on the $4.00 list at [facility] since he cannot afford more than that.  Dutch Quint RN  September 05, 2010 4:09 PM  Rx printed and in basket - unsure what [facility] to use n.martin,fnp September 05, 2010  4:53 PM  Pt. advised of new Rx - faxed to [facility] [address] per pt. request.  Dutch Quint RN  September 06, 2010 11:52 AM     Appended Document: meds refill Lisinopril also sent to [facility] on [address]  Dutch Quint RN  September 06, 2010 4:33 PM

## 2010-10-18 NOTE — Progress Notes (Signed)
Summary: NEEDIS REFILLS ON LISINOPRIL & PROTONIX  Phone Note Call from Patient Call back at Home Phone [phone]   Reason for Call: Refill Medication Summary of Call: Miguel Brady pt. Miguel Brady called and says that he needs a refill on his Lisinopril and Protonix to be called into [facility]. His card has expired and is going to call and make a recert appt. and he is going to ask the pharm. if he can get his refills with them.  Initial call taken by: Leodis Rains,  September 05, 2010 2:30 PM  Follow-up for Phone Call        Pt. notified of refills completed -- advised to make eligibility appointment as soon as possible.  Dutch Quint RN  September 05, 2010 3:35 PM     Prescriptions: PROTONIX 40 MG  TBEC (PANTOPRAZOLE SODIUM) 1 tablet by mouth daily  #30 x 3   Entered by:   Dutch Quint RN   Authorized by:   Lehman Prom FNP   Signed by:   Dutch Quint RN on 09/05/2010   Method used:   Faxed to ...       [facility] (retail)       [address] [place], [place]  [address]       Ph: [phone]       Fax: [phone]   RxID:   [id number] LISINOPRIL 20 MG  TABS (LISINOPRIL) 1 tablet by mouth daily  #30 x 3   Entered by:   Dutch Quint RN   Authorized by:   Lehman Prom FNP   Signed by:   Dutch Quint RN on 09/05/2010   Method used:   Faxed to ...       [facility] (retail)       [address] [place], [place]  [address]       Ph: [phone]       Fax: [phone]   RxID:   [id number]

## 2010-12-20 LAB — RAPID URINE DRUG SCREEN, HOSP PERFORMED
Amphetamines: NOT DETECTED
Barbiturates: NOT DETECTED
Cocaine: NOT DETECTED
Opiates: POSITIVE — AB
Tetrahydrocannabinol: NOT DETECTED

## 2010-12-20 LAB — DIFFERENTIAL
Basophils Absolute: 0 10*3/uL (ref 0.0–0.1)
Basophils Relative: 0 % (ref 0–1)
Eosinophils Absolute: 0.1 10*3/uL (ref 0.0–0.7)
Eosinophils Relative: 1 % (ref 0–5)
Lymphs Abs: 3.4 10*3/uL (ref 0.7–4.0)
Monocytes Relative: 8 % (ref 3–12)
Neutro Abs: 9.3 10*3/uL — ABNORMAL HIGH (ref 1.7–7.7)

## 2010-12-20 LAB — BASIC METABOLIC PANEL
BUN: 11 mg/dL (ref 6–23)
CO2: 31 mEq/L (ref 19–32)
GFR calc Af Amer: >60 mL/min (ref >60)
Glucose, Bld: 114 mg/dL — ABNORMAL HIGH (ref 70–99)
Potassium: 3.4 mEq/L — ABNORMAL LOW (ref 3.5–5.1)
Sodium: 137 mEq/L (ref 135–145)

## 2010-12-20 LAB — CK TOTAL AND CKMB (NOT AT ARMC)
CK, MB: 1.6 ng/mL (ref 0.3–4.0)
Relative Index: 0.8 (ref 0.0–2.5)
Total CK: 199 U/L (ref 7–232)
Total CK: 212 U/L (ref 7–232)

## 2010-12-20 LAB — CBC
MCHC: 33.2 g/dL (ref 30.0–36.0)
Platelets: 245 10*3/uL (ref 150–400)
RDW: 16.2 % — ABNORMAL HIGH (ref 11.5–15.5)

## 2010-12-20 LAB — POCT CARDIAC MARKERS: Myoglobin, poc: 178 ng/mL (ref 12–200)

## 2010-12-20 LAB — BRAIN NATRIURETIC PEPTIDE: Pro B Natriuretic peptide (BNP): <30 pg/mL (ref 0.0–100.0)
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
## 2011-08-02 ENCOUNTER — Emergency Department (HOSPITAL_COMMUNITY): Payer: Self-pay

## 2011-08-02 ENCOUNTER — Encounter: Payer: Self-pay | Admitting: Emergency Medicine

## 2011-08-02 ENCOUNTER — Emergency Department (HOSPITAL_COMMUNITY)
Admission: EM | Admit: 2011-08-02 | Discharge: 2011-08-02 | Disposition: A | Discharge status: Home / Self Care | Payer: Self-pay | Attending: Emergency Medicine | Admitting: Emergency Medicine

## 2011-08-02 DIAGNOSIS — E119 Type 2 diabetes mellitus without complications: Secondary | ICD-10-CM | POA: Insufficient documentation

## 2011-08-02 DIAGNOSIS — E669 Obesity, unspecified: Secondary | ICD-10-CM | POA: Insufficient documentation

## 2011-08-02 DIAGNOSIS — R209 Unspecified disturbances of skin sensation: Secondary | ICD-10-CM | POA: Insufficient documentation

## 2011-08-02 DIAGNOSIS — S92919A Unspecified fracture of unspecified toe(s), initial encounter for closed fracture: Secondary | ICD-10-CM | POA: Insufficient documentation

## 2011-08-02 DIAGNOSIS — I10 Essential (primary) hypertension: Secondary | ICD-10-CM | POA: Insufficient documentation

## 2011-08-02 DIAGNOSIS — W208XXA Other cause of strike by thrown, projected or falling object, initial encounter: Secondary | ICD-10-CM | POA: Insufficient documentation

## 2011-08-02 DIAGNOSIS — M79609 Pain in unspecified limb: Secondary | ICD-10-CM | POA: Insufficient documentation

## 2011-08-02 HISTORY — DX: Obesity, unspecified: E66.9

## 2011-08-02 HISTORY — DX: Essential (primary) hypertension: I10

## 2011-08-02 LAB — GLUCOSE, CAPILLARY: Glucose-Capillary: 97 mg/dL (ref 70–99)

## 2011-08-02 MED ORDER — IBUPROFEN 800 MG PO TABS: 800.0000 mg | ORAL_TABLET | Freq: Three times a day (TID) | ORAL | Status: AC

## 2011-08-02 MED ORDER — OXYCODONE-ACETAMINOPHEN 5-325 MG PO TABS
1.0000 | ORAL_TABLET | Freq: Once | ORAL | Status: AC
  Administered 2011-08-02: 1 via ORAL
  Filled 2011-08-02: qty 1

## 2011-08-02 MED ORDER — HYDROCODONE-ACETAMINOPHEN 5-325 MG PO TABS: 1.0000 | ORAL_TABLET | Freq: Four times a day (QID) | ORAL | Status: AC | PRN

## 2011-08-02 MED ORDER — DIPHENHYDRAMINE HCL 25 MG PO CAPS
50.0000 mg | ORAL_CAPSULE | Freq: Once | ORAL | Status: AC
  Administered 2011-08-02: 50 mg via ORAL
  Filled 2011-08-02: qty 2

## 2011-08-02 NOTE — ED Provider Notes (Signed)
Medical screening examination/treatment/procedure(s) were performed by non-physician practitioner and as supervising physician I was immediately available for consultation/collaboration.  Ethelda Chick, MD 08/02/11 [phone]

## 2011-08-02 NOTE — Progress Notes (Signed)
Orthopedic Tech Progress Note Patient Details:  Miguel Brady [birth year] [id number]  Other Ortho Devices Type of Ortho Device: Buddy tape;Postop boot   Shawnie Pons 08/02/2011, 9:40 AM

## 2011-08-02 NOTE — ED Notes (Signed)
Ortho on the way to buddy tape toes and bring post op boot

## 2011-08-02 NOTE — ED Notes (Signed)
cbg 97 

## 2011-08-02 NOTE — ED Notes (Signed)
PT. REPORTS BILATERAL FEET NUMBNESS / PAINFUL X1 WEEK  - STATES  DROPPED A REFRIGERATOR ON IT. AMBULATORY.

## 2011-08-02 NOTE — ED Notes (Signed)
Pt complaining of itching due to the pain medications given, thinks that he is having a allergic reaction.  No rash or redness noted, assured pt of such.  Pt given benadryl to help with the itching.

## 2011-08-02 NOTE — ED Provider Notes (Signed)
History     CSN: [id number] Arrival date & time: 08/02/2011  6:38 AM   First MD Initiated Contact with Patient 08/02/11 [phone]      Chief Complaint  Patient presents with  . Numbness   HPI Patient presents to emergency room with complaint of bilateral foot pain and left hand pain. Reports that he was attempting to move a refrigerator when it dropped on his feet. Patient denies any other injuries. Patient denies hitting his head. Patient denies any loss of consciousness. Patient denies seeking any medical attention. Patient reports that he came in today because the pain had not improved. Patient states that he feels that he is on bilateral feet numbness. States that he has had this from his diabetes in the past. Patient has a CBG of 97. No other complaints at this time.  Past Medical History  Diagnosis Date  . Hypertension   . Diabetes mellitus   . Obesity     History reviewed. No pertinent past surgical history.  No family history on file.  History  Substance Use Topics  . Smoking status: Current Everyday Smoker  . Smokeless tobacco: Not on file  . Alcohol Use: Yes     OCCASIONAL      Review of Systems  Constitutional: Negative for fever, chills, diaphoresis and appetite change.  HENT: Negative for neck pain.   Eyes: Negative for photophobia and visual disturbance.  Respiratory: Negative for cough, chest tightness and shortness of breath.   Cardiovascular: Negative for chest pain.  Gastrointestinal: Negative for nausea, vomiting and abdominal pain.  Genitourinary: Negative for flank pain.  Musculoskeletal: Negative for back pain and gait problem.  Skin: Negative for color change, rash and wound.  Neurological: Negative for weakness.  All other systems reviewed and are negative.    Allergies  Penicillins  Home Medications   Current Outpatient Rx  Name Route Sig Dispense Refill  . ASPIRIN EC 81 MG PO TBEC Oral Take 81 mg by mouth daily.      [name] METFORMIN HCL 500  MG PO TABS Oral Take 500 mg by mouth 2 (two) times daily with a meal.      . OVER THE COUNTER MEDICATION  [employer] antacid     . LISINOPRIL 40 MG PO TABS Oral Take 40 mg by mouth daily.        BP 159/91  Pulse 72  Temp(Src) 97.9 F (36.6 C) (Oral)  Resp 15  SpO2 98%  Physical Exam  Nursing note and vitals reviewed. Constitutional: He is oriented to person, place, and time. He appears well-developed and well-nourished.  HENT:  Head: Normocephalic and atraumatic.  Eyes: EOM are normal. Pupils are equal, round, and reactive to light.  Neck: Normal range of motion. Neck supple. No JVD present. No tracheal deviation present.  Cardiovascular: Normal rate, regular rhythm and normal heart sounds.  Exam reveals no gallop and no friction rub.   No murmur heard. Pulmonary/Chest: Effort normal and breath sounds normal. No stridor. No respiratory distress. He has no wheezes. He has no rales. He exhibits no tenderness.  Abdominal: Soft. Bowel sounds are normal. There is no tenderness.  Musculoskeletal: Normal range of motion. He exhibits tenderness. He exhibits no edema.       Tenderness to palpation to the first right toe DIP. Dorsalis pedis and tibialis posterior pulses intact no proximal fifth metatarsal tenderness. No lateral or medial malleolus tenderness. Minimal tenderness of the left foot.   Lymphadenopathy:    He has  no cervical adenopathy.  Neurological: He is alert and oriented to person, place, and time.  Skin: Skin is warm and dry. No rash noted. No erythema. No pallor.  Psychiatric: He has a normal mood and affect. Judgment and thought content normal.    ED Course  Procedures (including critical care time)  Patient seen and evaluated.  VSS reviewed. . Nursing notes reviewed. Discussed with attending physician. Initial testing ordered. Will monitor the patient closely. They agree with the treatment plan and diagnosis.   9:37 AM Patient seen and re-evaluated. Resting  comfortably. VSS stable. NAD. Patient notified of testing results. Stated agreement and understanding. Patient stated understanding to treatment plan and diagnosis. Post op shoe applied for nondisplaced fracture of first distal phalanx of right firs toe.   Results for orders placed during the hospital encounter of 08/02/11  GLUCOSE, CAPILLARY      Component Value Range   Glucose-Capillary 97  70 - 99 (mg/dL)   Dg Hand Complete Left  08/02/2011  *RADIOLOGY REPORT*  Clinical Data: Heavy object fell on hand with pain in the fingers  LEFT HAND - COMPLETE 3+ VIEW  Comparison: None.  Findings: No acute fracture is seen.  Alignment is normal.  Joint spaces appear normal.  IMPRESSION: Negative.  Original Report Authenticated By: Juline Patch, M.D.   Dg Foot Complete Left  08/02/2011  *RADIOLOGY REPORT*  Clinical Data: Trauma, pain  LEFT FOOT - COMPLETE 3+ VIEW  Comparison: 08/02/2011  Findings: Normal alignment.  No fracture or radiographic swelling. No foreign body identified.  Preserved joint spaces.  IMPRESSION: No acute osseous finding  Original Report Authenticated By: Judie Petit. Ruel Favors, M.D.   Dg Foot Complete Right  08/02/2011  *RADIOLOGY REPORT*  Clinical Data: Heavy object fell on foot with pain in the toes  RIGHT FOOT COMPLETE - 3+ VIEW  Comparison: None.  Findings: Tarsal - metatarsal alignment is normal. There is a nondisplaced fracture through the base of the distal phalanx of the right great toe laterally.  Joint spaces appear normal.  IMPRESSION:   Nondisplaced fracture of the base of the distal phalanx of the right great toe.  Original Report Authenticated By: Juline Patch, M.D.    MDM  Nondisplaced fracture of fifth DIP        Demetrius Charity, PA 08/02/11 [phone]

## 2012-04-02 ENCOUNTER — Emergency Department (HOSPITAL_COMMUNITY): Payer: No Typology Code available for payment source

## 2012-04-02 ENCOUNTER — Inpatient Hospital Stay (HOSPITAL_COMMUNITY)
Admission: EM | Admit: 2012-04-02 | Discharge: 2012-04-22 | DRG: 956 | Disposition: A | Discharge status: Rehabilitation Facility | Payer: No Typology Code available for payment source | Attending: General Surgery | Admitting: General Surgery

## 2012-04-02 ENCOUNTER — Encounter (HOSPITAL_COMMUNITY): Payer: Self-pay | Admitting: Radiology

## 2012-04-02 DIAGNOSIS — J9600 Acute respiratory failure, unspecified whether with hypoxia or hypercapnia: Secondary | ICD-10-CM | POA: Diagnosis present

## 2012-04-02 DIAGNOSIS — R4182 Altered mental status, unspecified: Secondary | ICD-10-CM

## 2012-04-02 DIAGNOSIS — Y849 Medical procedure, unspecified as the cause of abnormal reaction of the patient, or of later complication, without mention of misadventure at the time of the procedure: Secondary | ICD-10-CM | POA: Diagnosis not present

## 2012-04-02 DIAGNOSIS — S020XXB Fracture of vault of skull, initial encounter for open fracture: Secondary | ICD-10-CM | POA: Diagnosis present

## 2012-04-02 DIAGNOSIS — D638 Anemia in other chronic diseases classified elsewhere: Secondary | ICD-10-CM | POA: Diagnosis present

## 2012-04-02 DIAGNOSIS — I10 Essential (primary) hypertension: Secondary | ICD-10-CM | POA: Diagnosis present

## 2012-04-02 DIAGNOSIS — E119 Type 2 diabetes mellitus without complications: Secondary | ICD-10-CM | POA: Diagnosis present

## 2012-04-02 DIAGNOSIS — Z79899 Other long term (current) drug therapy: Secondary | ICD-10-CM

## 2012-04-02 DIAGNOSIS — J942 Hemothorax: Secondary | ICD-10-CM

## 2012-04-02 DIAGNOSIS — Z6841 Body Mass Index (BMI) 40.0 and over, adult: Secondary | ICD-10-CM

## 2012-04-02 DIAGNOSIS — S2249XA Multiple fractures of ribs, unspecified side, initial encounter for closed fracture: Secondary | ICD-10-CM | POA: Diagnosis present

## 2012-04-02 DIAGNOSIS — B9689 Other specified bacterial agents as the cause of diseases classified elsewhere: Secondary | ICD-10-CM | POA: Diagnosis not present

## 2012-04-02 DIAGNOSIS — S7292XB Unspecified fracture of left femur, initial encounter for open fracture type I or II: Secondary | ICD-10-CM

## 2012-04-02 DIAGNOSIS — S0191XA Laceration without foreign body of unspecified part of head, initial encounter: Secondary | ICD-10-CM

## 2012-04-02 DIAGNOSIS — Y998 Other external cause status: Secondary | ICD-10-CM

## 2012-04-02 DIAGNOSIS — J95851 Ventilator associated pneumonia: Secondary | ICD-10-CM | POA: Diagnosis not present

## 2012-04-02 DIAGNOSIS — S82009A Unspecified fracture of unspecified patella, initial encounter for closed fracture: Secondary | ICD-10-CM | POA: Diagnosis present

## 2012-04-02 DIAGNOSIS — S27329A Contusion of lung, unspecified, initial encounter: Secondary | ICD-10-CM | POA: Diagnosis present

## 2012-04-02 DIAGNOSIS — S82409B Unspecified fracture of shaft of unspecified fibula, initial encounter for open fracture type I or II: Secondary | ICD-10-CM | POA: Diagnosis present

## 2012-04-02 DIAGNOSIS — S0291XB Unspecified fracture of skull, initial encounter for open fracture: Secondary | ICD-10-CM

## 2012-04-02 DIAGNOSIS — S32009A Unspecified fracture of unspecified lumbar vertebra, initial encounter for closed fracture: Secondary | ICD-10-CM

## 2012-04-02 DIAGNOSIS — S272XXA Traumatic hemopneumothorax, initial encounter: Secondary | ICD-10-CM | POA: Diagnosis present

## 2012-04-02 DIAGNOSIS — S82202B Unspecified fracture of shaft of left tibia, initial encounter for open fracture type I or II: Secondary | ICD-10-CM

## 2012-04-02 DIAGNOSIS — S2239XA Fracture of one rib, unspecified side, initial encounter for closed fracture: Secondary | ICD-10-CM

## 2012-04-02 DIAGNOSIS — S82209B Unspecified fracture of shaft of unspecified tibia, initial encounter for open fracture type I or II: Secondary | ICD-10-CM | POA: Diagnosis present

## 2012-04-02 DIAGNOSIS — S329XXA Fracture of unspecified parts of lumbosacral spine and pelvis, initial encounter for closed fracture: Secondary | ICD-10-CM

## 2012-04-02 DIAGNOSIS — I498 Other specified cardiac arrhythmias: Secondary | ICD-10-CM | POA: Diagnosis not present

## 2012-04-02 DIAGNOSIS — S7292XA Unspecified fracture of left femur, initial encounter for closed fracture: Secondary | ICD-10-CM

## 2012-04-02 DIAGNOSIS — S32509A Unspecified fracture of unspecified pubis, initial encounter for closed fracture: Secondary | ICD-10-CM | POA: Diagnosis present

## 2012-04-02 DIAGNOSIS — Y92410 Unspecified street and highway as the place of occurrence of the external cause: Secondary | ICD-10-CM

## 2012-04-02 DIAGNOSIS — J9819 Other pulmonary collapse: Secondary | ICD-10-CM | POA: Diagnosis present

## 2012-04-02 DIAGNOSIS — S020XXA Fracture of vault of skull, initial encounter for closed fracture: Secondary | ICD-10-CM

## 2012-04-02 DIAGNOSIS — D62 Acute posthemorrhagic anemia: Secondary | ICD-10-CM | POA: Diagnosis present

## 2012-04-02 DIAGNOSIS — S72009B Fracture of unspecified part of neck of unspecified femur, initial encounter for open fracture type I or II: Principal | ICD-10-CM | POA: Diagnosis present

## 2012-04-02 HISTORY — DX: Headache: R51

## 2012-04-02 LAB — CBC
HCT: 38.6 % — ABNORMAL LOW (ref 39.0–52.0)
MCV: 87.1 fL (ref 78.0–100.0)
RDW: 15 % (ref 11.5–15.5)
WBC: 12.5 10*3/uL — ABNORMAL HIGH (ref 4.0–10.5)

## 2012-04-02 LAB — PROTIME-INR: INR: 1.22 (ref 0.00–1.49)

## 2012-04-02 LAB — POCT I-STAT, CHEM 8
BUN: 9 mg/dL (ref 6–23)
Chloride: 106 mEq/L (ref 96–112)
Creatinine, Ser: 1.4 mg/dL — ABNORMAL HIGH (ref 0.50–1.35)
Sodium: 142 mEq/L (ref 135–145)

## 2012-04-02 LAB — COMPREHENSIVE METABOLIC PANEL
ALT: 51 U/L (ref 0–53)
Albumin: 3.3 g/dL — ABNORMAL LOW (ref 3.5–5.2)
Alkaline Phosphatase: 104 U/L (ref 39–117)
BUN: 9 mg/dL (ref 6–23)
Potassium: 3.3 mEq/L — ABNORMAL LOW (ref 3.5–5.1)
Sodium: 139 mEq/L (ref 135–145)
Total Protein: 7.6 g/dL (ref 6.0–8.3)

## 2012-04-02 LAB — ABO/RH: ABO/RH(D): A POS

## 2012-04-02 MED ORDER — LIDOCAINE HCL (CARDIAC) 20 MG/ML IV SOLN
INTRAVENOUS | Status: AC | PRN
  Administered 2012-04-02: 100 mg via INTRAVENOUS

## 2012-04-02 MED ORDER — ETOMIDATE 2 MG/ML IV SOLN
INTRAVENOUS | Status: AC | PRN
  Administered 2012-04-02: 30 mg via INTRAVENOUS

## 2012-04-02 MED ORDER — SUCCINYLCHOLINE CHLORIDE 20 MG/ML IJ SOLN
INTRAMUSCULAR | Status: AC
  Filled 2012-04-02: qty 10

## 2012-04-02 MED ORDER — LIDOCAINE HCL (CARDIAC) 20 MG/ML IV SOLN
INTRAVENOUS | Status: AC
  Filled 2012-04-02: qty 5

## 2012-04-02 MED ORDER — CEFAZOLIN SODIUM 1-5 GM-% IV SOLN
1.0000 g | Freq: Once | INTRAVENOUS | Status: AC
  Administered 2012-04-03: 1 g via INTRAVENOUS
  Administered 2012-04-03: 2 g via INTRAVENOUS
  Filled 2012-04-02: qty 50

## 2012-04-02 MED ORDER — VECURONIUM BROMIDE 10 MG IV SOLR
INTRAVENOUS | Status: AC
  Filled 2012-04-02: qty 20

## 2012-04-02 MED ORDER — MIDAZOLAM HCL 2 MG/2ML IJ SOLN
INTRAMUSCULAR | Status: AC
  Filled 2012-04-02: qty 2

## 2012-04-02 MED ORDER — ETOMIDATE 2 MG/ML IV SOLN
INTRAVENOUS | Status: AC
  Filled 2012-04-02: qty 20

## 2012-04-02 MED ORDER — TETANUS-DIPHTH-ACELL PERTUSSIS 5-2.5-18.5 LF-MCG/0.5 IM SUSP
0.5000 mL | Freq: Once | INTRAMUSCULAR | Status: AC
  Administered 2012-04-03: 0.5 mL via INTRAMUSCULAR
  Filled 2012-04-02: qty 0.5

## 2012-04-02 MED ORDER — SODIUM CHLORIDE 0.9 % IV SOLN
INTRAVENOUS | Status: AC | PRN
  Administered 2012-04-02 (×3): 1000 mL/h via INTRAVENOUS

## 2012-04-02 MED ORDER — ROCURONIUM BROMIDE 50 MG/5ML IV SOLN
INTRAVENOUS | Status: AC
  Filled 2012-04-02: qty 2

## 2012-04-02 MED ORDER — SUCCINYLCHOLINE CHLORIDE 20 MG/ML IJ SOLN
INTRAMUSCULAR | Status: AC | PRN
  Administered 2012-04-02: 150 mg via INTRAVENOUS

## 2012-04-02 MED ORDER — ROCURONIUM BROMIDE 50 MG/5ML IV SOLN
INTRAVENOUS | Status: AC | PRN
  Administered 2012-04-02: 100 mg via INTRAVENOUS
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
## 2012-04-03 ENCOUNTER — Encounter (HOSPITAL_COMMUNITY): Payer: Self-pay | Admitting: Emergency Medicine

## 2012-04-03 ENCOUNTER — Inpatient Hospital Stay (HOSPITAL_COMMUNITY): Payer: No Typology Code available for payment source

## 2012-04-03 ENCOUNTER — Emergency Department (HOSPITAL_COMMUNITY): Payer: No Typology Code available for payment source | Admitting: Certified Registered"

## 2012-04-03 ENCOUNTER — Encounter (HOSPITAL_COMMUNITY): Admission: EM | Disposition: A | Discharge status: Rehabilitation Facility | Payer: Self-pay | Source: Home / Self Care

## 2012-04-03 ENCOUNTER — Encounter (HOSPITAL_COMMUNITY): Payer: Self-pay | Admitting: Certified Registered"

## 2012-04-03 DIAGNOSIS — S329XXB Fracture of unspecified parts of lumbosacral spine and pelvis, initial encounter for open fracture: Secondary | ICD-10-CM

## 2012-04-03 DIAGNOSIS — S32009A Unspecified fracture of unspecified lumbar vertebra, initial encounter for closed fracture: Secondary | ICD-10-CM

## 2012-04-03 DIAGNOSIS — J942 Hemothorax: Secondary | ICD-10-CM

## 2012-04-03 DIAGNOSIS — S82009A Unspecified fracture of unspecified patella, initial encounter for closed fracture: Secondary | ICD-10-CM

## 2012-04-03 DIAGNOSIS — J95821 Acute postprocedural respiratory failure: Secondary | ICD-10-CM

## 2012-04-03 DIAGNOSIS — S329XXA Fracture of unspecified parts of lumbosacral spine and pelvis, initial encounter for closed fracture: Secondary | ICD-10-CM | POA: Insufficient documentation

## 2012-04-03 DIAGNOSIS — S271XXA Traumatic hemothorax, initial encounter: Secondary | ICD-10-CM

## 2012-04-03 DIAGNOSIS — S7290XB Unspecified fracture of unspecified femur, initial encounter for open fracture type I or II: Secondary | ICD-10-CM

## 2012-04-03 DIAGNOSIS — S82202B Unspecified fracture of shaft of left tibia, initial encounter for open fracture type I or II: Secondary | ICD-10-CM

## 2012-04-03 DIAGNOSIS — S7292XB Unspecified fracture of left femur, initial encounter for open fracture type I or II: Secondary | ICD-10-CM

## 2012-04-03 DIAGNOSIS — S82209B Unspecified fracture of shaft of unspecified tibia, initial encounter for open fracture type I or II: Secondary | ICD-10-CM

## 2012-04-03 DIAGNOSIS — T794XXA Traumatic shock, initial encounter: Secondary | ICD-10-CM

## 2012-04-03 DIAGNOSIS — S2249XA Multiple fractures of ribs, unspecified side, initial encounter for closed fracture: Secondary | ICD-10-CM

## 2012-04-03 DIAGNOSIS — D62 Acute posthemorrhagic anemia: Secondary | ICD-10-CM

## 2012-04-03 DIAGNOSIS — S0280XB Fracture of other specified skull and facial bones, unspecified side, initial encounter for open fracture: Secondary | ICD-10-CM

## 2012-04-03 DIAGNOSIS — S82409B Unspecified fracture of shaft of unspecified fibula, initial encounter for open fracture type I or II: Secondary | ICD-10-CM

## 2012-04-03 DIAGNOSIS — S020XXA Fracture of vault of skull, initial encounter for closed fracture: Secondary | ICD-10-CM

## 2012-04-03 HISTORY — PX: INCISION AND DRAINAGE OF WOUND: SHX1803

## 2012-04-03 HISTORY — PX: EXTERNAL FIXATION LEG: SHX1549

## 2012-04-03 HISTORY — PX: CHEST TUBE INSERTION: SHX231

## 2012-04-03 HISTORY — PX: EXTERNAL FIXATION PELVIS: SHX1551

## 2012-04-03 LAB — POCT I-STAT 3, ART BLOOD GAS (G3+)
Bicarbonate: 16.3 mEq/L — ABNORMAL LOW (ref 20.0–24.0)
Bicarbonate: 23.5 meq/L (ref 20.0–24.0)
Patient temperature: 35.9
Patient temperature: 98.7
TCO2: 24 mmol/L (ref 0–100)
pCO2 arterial: 42.3 mmHg (ref 35.0–45.0)
pCO2 arterial: 42.9 mmHg (ref 35.0–45.0)
pH, Arterial: 7.189 — CL (ref 7.350–7.450)
pH, Arterial: 7.313 — ABNORMAL LOW (ref 7.350–7.450)
pH, Arterial: 7.349 — ABNORMAL LOW (ref 7.350–7.450)
pO2, Arterial: 115 mmHg — ABNORMAL HIGH (ref 80.0–100.0)

## 2012-04-03 LAB — POCT I-STAT 7, (LYTES, BLD GAS, ICA,H+H)
Acid-base deficit: 10 mmol/L — ABNORMAL HIGH (ref 0.0–2.0)
Acid-base deficit: 7 mmol/L — ABNORMAL HIGH (ref 0.0–2.0)
Bicarbonate: 17.1 meq/L — ABNORMAL LOW (ref 20.0–24.0)
Bicarbonate: 18.6 meq/L — ABNORMAL LOW (ref 20.0–24.0)
Calcium, Ion: 1.01 mmol/L — ABNORMAL LOW (ref 1.12–1.23)
Calcium, Ion: 0.95 mmol/L — ABNORMAL LOW (ref 1.12–1.23)
Calcium, Ion: 0.99 mmol/L — ABNORMAL LOW (ref 1.12–1.23)
HCT: 25 % — ABNORMAL LOW (ref 39.0–52.0)
HCT: 28 % — ABNORMAL LOW (ref 39.0–52.0)
HCT: 27 % — ABNORMAL LOW (ref 39.0–52.0)
Hemoglobin: 8.5 g/dL — ABNORMAL LOW (ref 13.0–17.0)
Hemoglobin: 9.2 g/dL — ABNORMAL LOW (ref 13.0–17.0)
O2 Saturation: 99 %
Patient temperature: 35.7
Sodium: 146 meq/L — ABNORMAL HIGH (ref 135–145)
pCO2 arterial: 40.7 mmHg (ref 35.0–45.0)
pCO2 arterial: 43.9 mmHg (ref 35.0–45.0)
pH, Arterial: 7.224 — ABNORMAL LOW (ref 7.350–7.450)
pH, Arterial: 7.241 — ABNORMAL LOW (ref 7.350–7.450)
pO2, Arterial: 125 mmHg — ABNORMAL HIGH (ref 80.0–100.0)
pO2, Arterial: 133 mmHg — ABNORMAL HIGH (ref 80.0–100.0)
pO2, Arterial: 135 mmHg — ABNORMAL HIGH (ref 80.0–100.0)

## 2012-04-03 LAB — POCT I-STAT 4, (NA,K, GLUC, HGB,HCT)
HCT: 29 % — ABNORMAL LOW (ref 39.0–52.0)
Sodium: 147 meq/L — ABNORMAL HIGH (ref 135–145)

## 2012-04-03 LAB — PREPARE FRESH FROZEN PLASMA: Unit division: 0

## 2012-04-03 LAB — CBC WITH DIFFERENTIAL/PLATELET
Basophils Absolute: 0 10*3/uL (ref 0.0–0.1)
Basophils Relative: 0 % (ref 0–1)
Eosinophils Absolute: 0 10*3/uL (ref 0.0–0.7)
Eosinophils Relative: 0 % (ref 0–5)
HCT: 23.4 % — ABNORMAL LOW (ref 39.0–52.0)
MCHC: 34.2 g/dL (ref 30.0–36.0)
MCV: 84.8 fL (ref 78.0–100.0)
Monocytes Absolute: 1 10*3/uL (ref 0.1–1.0)
Neutro Abs: 5 10*3/uL (ref 1.7–7.7)
RDW: 14.9 % (ref 11.5–15.5)

## 2012-04-03 LAB — COMPREHENSIVE METABOLIC PANEL
ALT: 39 U/L (ref 0–53)
AST: 107 U/L — ABNORMAL HIGH (ref 0–37)
CO2: 22 mEq/L (ref 19–32)
Calcium: 6.6 mg/dL — ABNORMAL LOW (ref 8.4–10.5)
GFR calc non Af Amer: >90 mL/min (ref >90)
Sodium: 145 mEq/L (ref 135–145)
Total Protein: 4.6 g/dL — ABNORMAL LOW (ref 6.0–8.3)

## 2012-04-03 LAB — URINALYSIS, MICROSCOPIC ONLY
Ketones, ur: NEGATIVE mg/dL
Leukocytes, UA: NEGATIVE
Nitrite: NEGATIVE
Protein, ur: NEGATIVE mg/dL
Specific Gravity, Urine: 1.01 (ref 1.005–1.030)

## 2012-04-03 LAB — MRSA PCR SCREENING: MRSA by PCR: POSITIVE — AB

## 2012-04-03 LAB — POCT I-STAT, CHEM 8
Creatinine, Ser: 1 mg/dL (ref 0.50–1.35)
Hemoglobin: 8.2 g/dL — ABNORMAL LOW (ref 13.0–17.0)
Sodium: 145 meq/L (ref 135–145)
TCO2: 22 mmol/L (ref 0–100)

## 2012-04-03 LAB — PROTIME-INR: INR: 1.44 (ref 0.00–1.49)

## 2012-04-03 LAB — CBC
MCH: 29.4 pg (ref 26.0–34.0)
Platelets: 84 10*3/uL — ABNORMAL LOW (ref 150–400)
RBC: 3.13 MIL/uL — ABNORMAL LOW (ref 4.22–5.81)

## 2012-04-03 SURGERY — EXTERNAL FIXATION, PELVIS
Anesthesia: General | Site: Scalp | Wound class: Dirty or Infected

## 2012-04-03 MED ORDER — MIDAZOLAM HCL 5 MG/5ML IJ SOLN
INTRAMUSCULAR | Status: AC | PRN
  Administered 2012-04-02 – 2012-04-03 (×2): 2 mg via INTRAVENOUS

## 2012-04-03 MED ORDER — CHLORHEXIDINE GLUCONATE 0.12 % MT SOLN
15.0000 mL | Freq: Two times a day (BID) | OROMUCOSAL | Status: DC
  Administered 2012-04-03 – 2012-04-22 (×39): 15 mL via OROMUCOSAL
  Filled 2012-04-03 (×41): qty 15

## 2012-04-03 MED ORDER — MUPIROCIN 2 % EX OINT
1.0000 "application " | TOPICAL_OINTMENT | Freq: Two times a day (BID) | CUTANEOUS | Status: AC
  Administered 2012-04-03 – 2012-04-07 (×9): 1 via NASAL
  Filled 2012-04-03: qty 22

## 2012-04-03 MED ORDER — SODIUM CHLORIDE 0.9 % IR SOLN
Status: DC | PRN
  Administered 2012-04-03: 12000 mL

## 2012-04-03 MED ORDER — MIDAZOLAM BOLUS VIA INFUSION
1.0000 mg | INTRAVENOUS | Status: DC | PRN
  Administered 2012-04-10 – 2012-04-11 (×2): 2 mg via INTRAVENOUS
  Filled 2012-04-03 (×2): qty 2

## 2012-04-03 MED ORDER — PHENYLEPHRINE HCL 10 MG/ML IJ SOLN
INTRAMUSCULAR | Status: DC | PRN
  Administered 2012-04-03 (×3): 80 ug via INTRAVENOUS
  Administered 2012-04-03: 160 ug via INTRAVENOUS
  Administered 2012-04-03: 80 ug via INTRAVENOUS

## 2012-04-03 MED ORDER — IOHEXOL 300 MG/ML  SOLN: 50.0000 mL | Freq: Once | INTRAMUSCULAR | Status: AC | PRN

## 2012-04-03 MED ORDER — FENTANYL CITRATE 0.05 MG/ML IJ SOLN
INTRAMUSCULAR | Status: AC
  Administered 2012-04-03: 100 ug
  Filled 2012-04-03: qty 2

## 2012-04-03 MED ORDER — BACITRACIN ZINC 500 UNIT/GM EX OINT
TOPICAL_OINTMENT | CUTANEOUS | Status: DC | PRN
  Administered 2012-04-03: 1 via TOPICAL

## 2012-04-03 MED ORDER — SODIUM CHLORIDE 0.9 % IV SOLN
INTRAVENOUS | Status: DC | PRN
  Administered 2012-04-03: 01:00:00 via INTRAVENOUS

## 2012-04-03 MED ORDER — ONDANSETRON HCL 4 MG/2ML IJ SOLN: 4.0000 mg | Freq: Four times a day (QID) | INTRAMUSCULAR | Status: DC | PRN

## 2012-04-03 MED ORDER — IOHEXOL 300 MG/ML  SOLN
100.0000 mL | Freq: Once | INTRAMUSCULAR | Status: AC | PRN
  Administered 2012-04-03: 100 mL via INTRAVENOUS

## 2012-04-03 MED ORDER — CHLORHEXIDINE GLUCONATE CLOTH 2 % EX PADS
6.0000 | MEDICATED_PAD | Freq: Every day | CUTANEOUS | Status: AC
  Administered 2012-04-03 – 2012-04-07 (×5): 6 via TOPICAL

## 2012-04-03 MED ORDER — PHENYLEPHRINE HCL 10 MG/ML IJ SOLN
20.0000 mg | INTRAVENOUS | Status: DC | PRN
  Administered 2012-04-03: 50 ug/min via INTRAVENOUS

## 2012-04-03 MED ORDER — SODIUM CHLORIDE 0.9 % IR SOLN
Status: DC | PRN
  Administered 2012-04-03: 03:00:00

## 2012-04-03 MED ORDER — SUFENTANIL CITRATE 50 MCG/ML IV SOLN
INTRAVENOUS | Status: DC | PRN
  Administered 2012-04-03: 10 ug via INTRAVENOUS

## 2012-04-03 MED ORDER — 0.9 % SODIUM CHLORIDE (POUR BTL) OPTIME
TOPICAL | Status: DC | PRN
  Administered 2012-04-03: 1000 mL

## 2012-04-03 MED ORDER — CHLORHEXIDINE GLUCONATE 0.12 % MT SOLN
OROMUCOSAL | Status: AC
  Administered 2012-04-03: 15 mL via OROMUCOSAL
  Filled 2012-04-03: qty 15

## 2012-04-03 MED ORDER — PANTOPRAZOLE SODIUM 40 MG PO TBEC
40.0000 mg | DELAYED_RELEASE_TABLET | Freq: Every day | ORAL | Status: DC
  Administered 2012-04-22: 40 mg via ORAL
  Filled 2012-04-03 (×3): qty 1

## 2012-04-03 MED ORDER — POTASSIUM CHLORIDE IN NACL 20-0.9 MEQ/L-% IV SOLN
INTRAVENOUS | Status: DC
  Administered 2012-04-03 – 2012-04-04 (×4): via INTRAVENOUS
  Administered 2012-04-05: 100 mL via INTRAVENOUS
  Filled 2012-04-03 (×11): qty 1000

## 2012-04-03 MED ORDER — MIDAZOLAM HCL 2 MG/2ML IJ SOLN
INTRAMUSCULAR | Status: AC
  Administered 2012-04-03: 2 mg via INTRAVENOUS
  Filled 2012-04-03: qty 2

## 2012-04-03 MED ORDER — ONDANSETRON HCL 4 MG PO TABS: 4.0000 mg | ORAL_TABLET | Freq: Four times a day (QID) | ORAL | Status: DC | PRN

## 2012-04-03 MED ORDER — HETASTARCH-ELECTROLYTES 6 % IV SOLN
INTRAVENOUS | Status: DC | PRN
  Administered 2012-04-03: 04:00:00 via INTRAVENOUS

## 2012-04-03 MED ORDER — SODIUM BICARBONATE 8.4 % IV SOLN
INTRAVENOUS | Status: DC | PRN
  Administered 2012-04-03 (×2): 50 meq via INTRAVENOUS

## 2012-04-03 MED ORDER — CEFAZOLIN SODIUM 1-5 GM-% IV SOLN
1.0000 g | Freq: Three times a day (TID) | INTRAVENOUS | Status: AC
  Administered 2012-04-03 – 2012-04-07 (×11): 1 g via INTRAVENOUS
  Filled 2012-04-03 (×13): qty 50

## 2012-04-03 MED ORDER — VECURONIUM BROMIDE 10 MG IV SOLR
INTRAVENOUS | Status: AC
  Administered 2012-04-03: 10 mg
  Filled 2012-04-03: qty 10

## 2012-04-03 MED ORDER — CEFAZOLIN SODIUM 1-5 GM-% IV SOLN
1.0000 g | Freq: Three times a day (TID) | INTRAVENOUS | Status: DC
  Administered 2012-04-03: 1 g via INTRAVENOUS
  Filled 2012-04-03 (×3): qty 50

## 2012-04-03 MED ORDER — PANTOPRAZOLE SODIUM 40 MG IV SOLR
40.0000 mg | Freq: Every day | INTRAVENOUS | Status: DC
  Administered 2012-04-03 – 2012-04-20 (×17): 40 mg via INTRAVENOUS
  Filled 2012-04-03 (×24): qty 40

## 2012-04-03 MED ORDER — BIOTENE DRY MOUTH MT LIQD
15.0000 mL | Freq: Four times a day (QID) | OROMUCOSAL | Status: DC
  Administered 2012-04-03 – 2012-04-22 (×72): 15 mL via OROMUCOSAL

## 2012-04-03 MED ORDER — FENTANYL BOLUS VIA INFUSION
50.0000 ug | Freq: Four times a day (QID) | INTRAVENOUS | Status: DC | PRN
  Administered 2012-04-10 – 2012-04-11 (×2): 100 ug via INTRAVENOUS
  Filled 2012-04-03: qty 100

## 2012-04-03 MED ORDER — ROCURONIUM BROMIDE 100 MG/10ML IV SOLN
INTRAVENOUS | Status: DC | PRN
  Administered 2012-04-03: 50 mg via INTRAVENOUS

## 2012-04-03 MED ORDER — SODIUM CHLORIDE 0.9 % IV SOLN
2.0000 mg/h | INTRAVENOUS | Status: DC
  Administered 2012-04-03: 5 mg/h via INTRAVENOUS
  Administered 2012-04-03: 4 mg/h via INTRAVENOUS
  Administered 2012-04-04 – 2012-04-05 (×3): 6 mg/h via INTRAVENOUS
  Administered 2012-04-06: 10 mg/h via INTRAVENOUS
  Administered 2012-04-06: 8 mg/h via INTRAVENOUS
  Administered 2012-04-06: 10 mg/h via INTRAVENOUS
  Administered 2012-04-06: 5 mg/h via INTRAVENOUS
  Administered 2012-04-07: 8 mg/h via INTRAVENOUS
  Administered 2012-04-07: 10 mg/h via INTRAVENOUS
  Administered 2012-04-08 (×2): 5 mg/h via INTRAVENOUS
  Administered 2012-04-08 (×2): 10 mg/h via INTRAVENOUS
  Administered 2012-04-08 – 2012-04-10 (×5): 7 mg/h via INTRAVENOUS
  Administered 2012-04-10: 2 mg/h via INTRAVENOUS
  Administered 2012-04-10 (×2): 5 mg/h via INTRAVENOUS
  Administered 2012-04-11 (×4): 7 mg/h via INTRAVENOUS
  Administered 2012-04-12 (×2): 9 mg/h via INTRAVENOUS
  Filled 2012-04-03 (×31): qty 10

## 2012-04-03 MED ORDER — LACTATED RINGERS IV SOLN
INTRAVENOUS | Status: DC | PRN
  Administered 2012-04-03: 02:00:00 via INTRAVENOUS

## 2012-04-03 MED ORDER — SODIUM CHLORIDE 0.9 % IV SOLN
50.0000 ug/h | INTRAVENOUS | Status: DC
  Administered 2012-04-03 (×2): 150 ug/h via INTRAVENOUS
  Administered 2012-04-04: 200 ug/h via INTRAVENOUS
  Administered 2012-04-05: 150 ug/h via INTRAVENOUS
  Administered 2012-04-06 (×2): 300 ug/h via INTRAVENOUS
  Administered 2012-04-07: 400 ug/h via INTRAVENOUS
  Administered 2012-04-07: 300 ug/h via INTRAVENOUS
  Administered 2012-04-08: 400 ug/h via INTRAVENOUS
  Administered 2012-04-08 – 2012-04-09 (×3): 200 ug/h via INTRAVENOUS
  Administered 2012-04-10: 150 ug/h via INTRAVENOUS
  Administered 2012-04-10: 200 ug/h via INTRAVENOUS
  Administered 2012-04-11: 250 ug/h via INTRAVENOUS
  Administered 2012-04-11: 200 ug/h via INTRAVENOUS
  Administered 2012-04-12 – 2012-04-13 (×4): 350 ug/h via INTRAVENOUS
  Administered 2012-04-13: 400 ug/h via INTRAVENOUS
  Administered 2012-04-13 – 2012-04-15 (×5): 350 ug/h via INTRAVENOUS
  Administered 2012-04-15: 75 ug/h via INTRAVENOUS
  Administered 2012-04-16 – 2012-04-17 (×4): 300 ug/h via INTRAVENOUS
  Administered 2012-04-17 – 2012-04-18 (×2): 225 ug/h via INTRAVENOUS
  Administered 2012-04-18: 300 ug/h via INTRAVENOUS
  Filled 2012-04-03 (×44): qty 50

## 2012-04-03 MED ORDER — DEXTROSE 5 % IV SOLN
700.0000 mg | INTRAVENOUS | Status: AC
  Administered 2012-04-03 – 2012-04-04 (×2): 700 mg via INTRAVENOUS
  Filled 2012-04-03 (×2): qty 17.5

## 2012-04-03 SURGICAL SUPPLY — 76 items
BANDAGE ELASTIC 4 VELCRO ST LF (GAUZE/BANDAGES/DRESSINGS) ×7 IMPLANT
BANDAGE ELASTIC 6 VELCRO ST LF (GAUZE/BANDAGES/DRESSINGS) ×7 IMPLANT
BANDAGE GAUZE ELAST BULKY 4 IN (GAUZE/BANDAGES/DRESSINGS) ×7 IMPLANT
BIT DRILL CANN LRG QC 5X300 (BIT) ×2 IMPLANT
BLADE SURG 15 STRL LF DISP TIS (BLADE) ×6 IMPLANT
BLADE SURG 15 STRL SS (BLADE) ×7
BLADE SURG ROTATE 9660 (MISCELLANEOUS) IMPLANT
BNDG COHESIVE 6X5 TAN STRL LF (GAUZE/BANDAGES/DRESSINGS) ×7 IMPLANT
CARBON FIBER ROD ×6 IMPLANT
CARBON ROD ×4 IMPLANT
CARBON ROD 650 ×2 IMPLANT
CATH THORACIC 28FR (CATHETERS) ×2 IMPLANT
CATH TROCAR 28FR (CATHETERS) ×2 IMPLANT
CLAMP LG COMBINATION (Clamp) ×14 IMPLANT
CLAMP LG MULTI PIN (Clamp) ×4 IMPLANT
CLOTH BEACON ORANGE TIMEOUT ST (SAFETY) ×7 IMPLANT
CUFF TOURNIQUET SINGLE 34IN LL (TOURNIQUET CUFF) IMPLANT
CUFF TOURNIQUET SINGLE 44IN (TOURNIQUET CUFF) IMPLANT
DRAPE C-ARM 42X72 X-RAY (DRAPES) ×9 IMPLANT
DRAPE LAPAROTOMY TRNSV 102X78 (DRAPE) ×4 IMPLANT
DRAPE ORTHO SPLIT 77X108 STRL (DRAPES) ×14
DRAPE PROXIMA HALF (DRAPES) ×16 IMPLANT
DRAPE SURG ORHT 6 SPLT 77X108 (DRAPES) ×12 IMPLANT
DRAPE U-SHAPE 47X51 STRL (DRAPES) ×7 IMPLANT
DRESSING TELFA 8X3 (GAUZE/BANDAGES/DRESSINGS) ×2 IMPLANT
DURAPREP 26ML APPLICATOR (WOUND CARE) ×7 IMPLANT
ELECT REM PT RETURN 9FT ADLT (ELECTROSURGICAL) ×7
ELECTRODE REM PT RTRN 9FT ADLT (ELECTROSURGICAL) ×6 IMPLANT
GAUZE XEROFORM 1X8 LF (GAUZE/BANDAGES/DRESSINGS) ×7 IMPLANT
GLOVE BIOGEL PI IND STRL 8 (GLOVE) ×12 IMPLANT
GLOVE BIOGEL PI INDICATOR 8 (GLOVE) ×2
GLOVE ECLIPSE 7.5 STRL STRAW (GLOVE) ×14 IMPLANT
GOWN PREVENTION PLUS XLARGE (GOWN DISPOSABLE) ×7 IMPLANT
GOWN SRG XL XLNG 56XLVL 4 (GOWN DISPOSABLE) ×6 IMPLANT
GOWN STRL NON-REIN LRG LVL3 (GOWN DISPOSABLE) ×7 IMPLANT
GOWN STRL NON-REIN XL XLG LVL4 (GOWN DISPOSABLE) ×7
GOWN STRL REIN XL XLG (GOWN DISPOSABLE) ×6 IMPLANT
GUIDEWIRE THREADED 2.8 (WIRE) ×6 IMPLANT
HANDPIECE INTERPULSE COAX TIP (DISPOSABLE) ×14
KIT BASIN OR (CUSTOM PROCEDURE TRAY) ×7 IMPLANT
KIT ROOM TURNOVER OR (KITS) ×7 IMPLANT
LARGE COMBINATION CLAMP ×14 IMPLANT
LARGE MULTI PIN CLAMP ×4 IMPLANT
MANIFOLD NEPTUNE II (INSTRUMENTS) ×7 IMPLANT
NEEDLE 22X1 1/2 (OR ONLY) (NEEDLE) ×7 IMPLANT
NS IRRIG 1000ML POUR BTL (IV SOLUTION) ×7 IMPLANT
PACK GENERAL/GYN (CUSTOM PROCEDURE TRAY) ×11 IMPLANT
PAD ARMBOARD 7.5X6 YLW CONV (MISCELLANEOUS) ×14 IMPLANT
ROD CARBON FIBER 650MM (Rod) ×2 IMPLANT
ROD CRBN FBR LRG EX-FX 11X200 (Rod) ×4 IMPLANT
ROD CRBN FBR LRG EX-FX 11X350 (Rod) ×2 IMPLANT
ROD CRBN FBR LRG EX-FX 11X400 (Rod) ×4 IMPLANT
SCREW CANN 32 THRD/100 7.3 (Screw) ×2 IMPLANT
SCREW CANN 32 THRD/95 7.3 (Screw) ×2 IMPLANT
SCREW CANN THREADED 7.3X85 (Screw) ×2 IMPLANT
SCREW SCHNZ SD 5.0 60 THRD/150 (Screw) ×4 IMPLANT
SCREW SCHNZ SD 5.0 80 THRD/200 (Screw) ×5 IMPLANT
SCRW SCHANZ SD 5.0 60 THRD/150 (Screw) ×28 IMPLANT
SCRW SCHANZ SD 5.0 80 THRD/200 (Screw) ×35 IMPLANT
SET HNDPC FAN SPRY TIP SCT (DISPOSABLE) ×2 IMPLANT
SHANTZ PIN 5X150 ×8 IMPLANT
SHANTZ PIN5X200 ×10 IMPLANT
SPONGE GAUZE 4X4 12PLY (GAUZE/BANDAGES/DRESSINGS) ×12 IMPLANT
STAPLER VISISTAT 35W (STAPLE) ×7 IMPLANT
STOCKINETTE IMPERVIOUS LG (DRAPES) ×7 IMPLANT
SUT ETHILON 2 0 FS 18 (SUTURE) ×4 IMPLANT
SUT ETHILON 4 0 PS 2 18 (SUTURE) ×4 IMPLANT
SUT VIC AB 0 CTB1 27 (SUTURE) ×5 IMPLANT
SUT VIC AB 2-0 CTB1 (SUTURE) ×5 IMPLANT
SUT VICRYL 2 0 J607H (SUTURE) ×2 IMPLANT
SYR CONTROL 10ML LL (SYRINGE) ×7 IMPLANT
TAPE HY-TAPE 1X5Y PINK NS LF (GAUZE/BANDAGES/DRESSINGS) ×2 IMPLANT
TOWEL OR 17X24 6PK STRL BLUE (TOWEL DISPOSABLE) ×7 IMPLANT
TOWEL OR 17X26 10 PK STRL BLUE (TOWEL DISPOSABLE) ×7 IMPLANT
VAC CANISTER ×2 IMPLANT
WATER STERILE IRR 1000ML POUR (IV SOLUTION) ×7 IMPLANT
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
## 2012-04-03 NOTE — Progress Notes (Signed)
Patient's mouth swabbed with chorhexidine gluconate and subglottic suction port hooked up to LIWS upon arrival to unit.

## 2012-04-03 NOTE — Progress Notes (Signed)
ANTIBIOTIC CONSULT NOTE - INITIAL  Pharmacy Consult for Gentamicin Indication: open fracture prophylaxis  Allergies  Allergen Reactions  . Shellfish Allergy Anaphylaxis    Patient Measurements: Height: 5\' 11"  (180.3 cm) Weight: 301 lb 9.4 oz (136.8 kg) IBW/kg (Calculated) : 75.3  Adjusted Body Weight: 100 kg  Vital Signs: Temp: 99.9 F (37.7 C) (07/19 1000) Temp src: Axillary (07/18 2223) BP: 142/84 mmHg (07/19 1000) Pulse Rate: 116  (07/19 1000) Intake/Output from previous day: 07/18 0701 - 07/19 0700 In: 4882 [I.V.:3000; Blood:1302; NG/GT:30; IV Piggyback:550] Out: 1535 [Urine:1485; Drains:50] Intake/Output from this shift: Total I/O In: 152.1 [I.V.:139.6; Blood:12.5] Out: 290 [Urine:265; Chest Tube:25]  Labs:  [facility] 04/03/12 0602 04/03/12 0550 04/03/12 0430 04/02/12 2234 04/02/12 2225  WBC -- 9.8 -- -- 12.5*  HGB 8.2* 9.2* 9.2* -- --  PLT -- 84* -- -- 219  LABCREA -- -- -- -- --  CREATININE 1.00 0.98 -- 1.40* --   Estimated Creatinine Clearance: 124.9 ml/min (by C-G formula based on Cr of 1). No results found for this basename: VANCOTROUGH:2,VANCOPEAK:2,VANCORANDOM:2,GENTTROUGH:2,GENTPEAK:2,GENTRANDOM:2,TOBRATROUGH:2,TOBRAPEAK:2,TOBRARND:2,AMIKACINPEAK:2,AMIKACINTROU:2,AMIKACIN:2, in the last 72 hours   Microbiology: Recent Results (from the past 720 hour(s))  MRSA PCR SCREENING     Status: Abnormal   Collection Time   04/03/12  5:45 AM      Component Value Range Status Comment   MRSA by PCR POSITIVE (*) NEGATIVE Final     Medical History: Past Medical History  Diagnosis Date  . Hypertension   . Diabetes mellitus   . Headache     Medications:  Anti-infectives     Start     Dose/Rate Route Frequency Ordered Stop   04/03/12 1400   ceFAZolin (ANCEF) IVPB 1 g/50 mL premix        1 g 100 mL/hr over 30 Minutes Intravenous 3 times per day 04/03/12 0930 04/07/12 1359   04/03/12 0600   ceFAZolin (ANCEF) IVPB 1 g/50 mL premix  Status:  Discontinued       1 g 100 mL/hr over 30 Minutes Intravenous 3 times per day 04/03/12 0550 04/03/12 0930   04/03/12 0249   polymyxin B 500,000 Units, bacitracin 50,000 Units in sodium chloride irrigation 0.9 % 500 mL irrigation  Status:  Discontinued          As needed 04/03/12 0249 04/03/12 0528   04/02/12 2315   ceFAZolin (ANCEF) IVPB 1 g/50 mL premix        1 g 100 mL/hr over 30 Minutes Intravenous  Once 04/02/12 2304 04/03/12 0125         Assessment: Open Fracture:  To broaden antimicrobial coverage with Gentamicin x 48 hours.  Will use an adjusted body weight for dosing due to obesity and will use extended-interval dosing to lessen the risk of nephrotoxicity.  Given he is critically ill and has received contrast this admission will monitor closely for any potential for nephrotoxicity.  Goal of Therapy:  Appropriate Gentamicin dosing for renal function  Plan:  Gentamicin 700mg  IV q24h x 2 doses Check random Gentamicin level in 8 hours and plot on [place] nomogram to evaluate if dosage adjustment is needed.  Estella Husk, Pharm.D., BCPS Clinical Pharmacist  Phone [phone] Pager [phone] 04/03/2012, 10:31 AM
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
## 2012-04-03 NOTE — ED Notes (Signed)
Once mother arrived, asked if pt on any medicines or allergic to any meds; mother states pt is on BP and DM medications and is allergic to shellfish; notified CRNA when arrived to OR d/t pt receiving contrast dye
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
## 2012-04-03 NOTE — Anesthesia Postprocedure Evaluation (Signed)
  Anesthesia Post-op Note  Patient: Miguel Brady  Procedure(s) Performed: Procedure(s) (LRB): EXTERNAL FIXATION PELVIS () EXTERNAL FIXATION LEG (Left) CHEST TUBE INSERTION (Left) IRRIGATION AND DEBRIDEMENT WOUND (N/A) SCALP LACERATION REPAIR (N/A)  Patient Location: SICU  Anesthesia Type: General  Level of Consciousness: sedated and unresponsive  Airway and Oxygen Therapy: Patient remains intubated per anesthesia plan  Post-op Pain: none  Post-op Assessment: Post-op Vital signs reviewed, Patient's Cardiovascular Status Stable and Respiratory Function Stable  Post-op Vital Signs: Reviewed and stable  Complications: No apparent anesthesia complications
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
## 2012-04-03 NOTE — Procedures (Signed)
Successful IVC FILTER PLACEMENT VIA RT FEMORAL VEIN NO COMP STABLE FULL REPORT IN PACS
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
## 2012-04-03 NOTE — Brief Op Note (Signed)
04/02/2012 - 04/03/2012  5:30 AM  PATIENT:  [name]  51 y.o. male  PRE-OPERATIVE DIAGNOSIS:  Open Tib/Fib Fracture- left, Pelvic fracture, Scalp Laceration.  POST-OPERATIVE DIAGNOSIS:  Open Tib/Fib Fracture- left, Pelvic fracture, Scalp Laceration.  PROCEDURE:  Procedure(s) (LRB): EXTERNAL FIXATION PELVIS () EXTERNAL FIXATION LEG (Left) femur External fixation left tibia fracture ORIF left femoral neck fracture I&D open grade 3A femur I&D open grade 3A tibia Application of medium wound vac  CHEST TUBE INSERTION (Left) IRRIGATION AND DEBRIDEMENT WOUND (N/A) SCALP LACERATION REPAIR (N/A)  SURGEON:  Surgeon(s) and Role: Panel 1:    * Budd Palmer, MD - Primary  *Harvie Junior, MD - Assisting     Panel 2:    * Liz Malady, MD - Primary  Panel 3:    * Clydene Fake, MD - Primary  PHYSICIAN ASSISTANT: Shirl Harris  ANESTHESIA:   general  EBL:  Total I/O In: 4302 [I.V.:2500; Blood:1302; IV Piggyback:500] Out: 610 [Urine:610]  BLOOD ADMINISTERED: 2 uPRBC, 2 FFP  DRAINS: wound vac   LOCAL MEDICATIONS USED:  NONE  SPECIMEN:  No Specimen  DISPOSITION OF SPECIMEN:  N/A  COUNTS:  YES  TOURNIQUET:  * No tourniquets in log *  DICTATION: .Other Dictation: Dictation Number [id number]  PLAN OF CARE: Admit to inpatient   PATIENT DISPOSITION:  ICU, serious   Delay start of Pharmacological VTE agent (>24hrs) due to surgical blood loss or risk of bleeding: no
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
## 2012-04-03 NOTE — Initial Assessments (Signed)
I saw and evaluated the patient, reviewed the resident's note and I agree with the findings and plan.   CRITICAL CARE Performed by: Nelia Shi   Total critical care time: 30 min  Critical care time was exclusive of separately billable procedures and treating other patients.  Critical care was necessary to treat or prevent imminent or life-threatening deterioration.  Critical care was time spent personally by me on the following activities: development of treatment plan with patient and/or surrogate as well as nursing, discussions with consultants, evaluation of patient's response to treatment, examination of patient, obtaining history from patient or surrogate, ordering and performing treatments and interventions, ordering and review of laboratory studies, ordering and review of radiographic studies, pulse oximetry and re-evaluation of patient's condition.

## 2012-04-03 NOTE — Procedures (Signed)
Bronchoscopy Procedure Note Miguel Brady [id number] 1961/08/28  Procedure: Bronchoscopy Indications: Diagnostic evaluation of the airways, Remove secretions and RUL collapse on AM CXR  Procedure Details Consent: Unable to obtain consent because of emergent medical necessity. Time Out: Verified patient identification, verified procedure, site/side was marked, verified correct patient position, special equipment/implants available, medications/allergies/relevent history reviewed, required imaging and test results available.  Performed  In preparation for procedure, patient was given 100% FiO2, bronchoscope lubricated and paralyzed and sedated. Sedation: Benzodiazepines, Muscle relaxants and fentanyl  Airway entered and the following bronchi were examined: RUL, RML, RLL, LUL, LLL and Bronchi.   Procedures performed: Brushings performed Bronchoscope removed.  , patient placed back on FIO2 60%  Evaluation Hemodynamic Status: BP stable throughout; O2 sats: stable throughout and 100% Patient's Current Condition: stable Specimens:  None Complications: No apparent complications Patient did tolerate procedure well. Will repeat CXR at noon to assess patency of RUL airway.  Cherylynn Ridges 04/03/2012
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
## 2012-04-03 NOTE — OR Nursing (Signed)
Times for procedure are as follows: Chest tube from 0125 to 0135, Scalp laceration repair/I&D from 0151 to 0205, and External fixation from 0212 to the time noted on the chart for procedure end.
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
## 2012-04-03 NOTE — Progress Notes (Signed)
Follow up - Trauma and Critical Care  Patient Details:    Miguel Brady is an 51 y.o. male.  Lines/tubes : AIRWAYS 7.5 mm (Active)  Secured at (cm) 23 cm 04/02/2012 12:00 AM     Airway 7.5 mm (Active)  Secured at (cm) 21 cm 04/03/2012  7:51 AM  Measured From Lips 04/03/2012  7:51 AM  Secured Location Right 04/03/2012  7:51 AM  Secured By [employer] 04/03/2012  7:51 AM  Tube Holder Repositioned Yes 04/03/2012  7:51 AM  Site Condition Erythema 04/03/2012 12:34 AM     CVC Double Lumen 04/03/12 Left Subclavian (Active)  Site Assessment Clean;Dry;Intact 04/03/2012  6:00 AM  Proximal Lumen Status Infusing 04/03/2012  6:00 AM  Distal Lumen Status Capped (Central line) 04/03/2012  6:00 AM  Dressing Type Transparent;Occlusive 04/03/2012  6:00 AM  Dressing Status Clean;Dry;Intact 04/03/2012  6:00 AM     Arterial Line 04/03/12 Right Radial (Active)  Site Assessment Clean;Dry;Intact 04/03/2012  6:00 AM  Line Status Pulsatile blood flow 04/03/2012  6:00 AM  Art Line Waveform Appropriate 04/03/2012  6:00 AM  Art Line Interventions Zeroed and calibrated;Leveled 04/03/2012  6:00 AM  Color/Movement/Sensation Capillary refill less than 3 sec 04/03/2012  6:00 AM  Dressing Type Transparent;Occlusive;Non-occlusive 04/03/2012  6:00 AM  Dressing Status Clean;Dry;Intact 04/03/2012  6:00 AM     Negative Pressure Wound Therapy Leg Left (Active)  Output (mL) 50 mL 04/03/2012  7:00 AM     NG/OG Tube Orogastric 16 Fr. Center mouth (Active)  Placement Verification Auscultation 04/03/2012  6:00 AM  Site Assessment Clean;Dry;Intact 04/03/2012  6:00 AM  Status Irrigated;Suction-low intermittent 04/03/2012  6:00 AM  Drainage Appearance Bile 04/03/2012  6:00 AM  Intake (mL) 30 mL 04/03/2012  6:00 AM     Urethral Catheter Non-latex;Temperature probe (Active)  Indication for Insertion or Continuance of Catheter Urinary output monitoring 04/03/2012  6:00 AM  Site Assessment Clean;Intact 04/03/2012  6:00 AM  Collection  Container Leg bag 04/03/2012  6:00 AM  Securement Method Leg strap 04/03/2012  6:00 AM  Output (mL) 175 mL 04/03/2012  8:00 AM    Microbiology/Sepsis markers: Results for orders placed during the hospital encounter of 04/02/12  MRSA PCR SCREENING     Status: Abnormal   Collection Time   04/03/12  5:45 AM      Component Value Range Status Comment   MRSA by PCR POSITIVE (*) NEGATIVE Final     Anti-infectives:  Anti-infectives     Start     Dose/Rate Route Frequency Ordered Stop   04/03/12 0600   ceFAZolin (ANCEF) IVPB 1 g/50 mL premix        1 g 100 mL/hr over 30 Minutes Intravenous 3 times per day 04/03/12 0550 04/05/12 0559   04/03/12 0249   polymyxin B 500,000 Units, bacitracin 50,000 Units in sodium chloride irrigation 0.9 % 500 mL irrigation  Status:  Discontinued          As needed 04/03/12 0249 04/03/12 0528   04/02/12 2315   ceFAZolin (ANCEF) IVPB 1 g/50 mL premix        1 g 100 mL/hr over 30 Minutes Intravenous  Once 04/02/12 2304 04/03/12 0125          Best Practice/Protocols:  Has neither PAS hose or pharmacological prophylaxis Continous Sedation may need ARDS protocol soon   Sats are okay, but airway pressures are high  Consults: Treatment Team:  Harvie Junior, MD Clydene Fake, MD Budd Palmer, MD  Events:  Subjective:    Overnight Issues: Just got to the ICU this AM about 6  Objective:  Vital signs for last 24 hours: Temp:  [96.4 F (35.8 C)-98.8 F (37.1 C)] 98.8 F (37.1 C) (07/19 0800) Pulse Rate:  [94-131] 124  (07/19 0800) Resp:  [14-32] 19  (07/19 0800) BP: (67-179)/(30-107) 122/72 mmHg (07/19 0800) SpO2:  [77 %-100 %] 100 % (07/19 0800) Arterial Line BP: (143-228)/(70-111) 146/71 mmHg (07/19 0800) FiO2 (%):  [60 %-100 %] 60.1 % (07/19 0800)  Hemodynamic parameters for last 24 hours:    Intake/Output from previous day: 07/18 0701 - 07/19 0700 In: 4882 [I.V.:3000; Blood:1302; NG/GT:30; IV Piggyback:550] Out: 1535 [Urine:1485;  Drains:50]  Intake/Output this shift: Total I/O In: 152.1 [I.V.:139.6; Blood:12.5] Out: 175 [Urine:175]  Vent settings for last 24 hours: Vent Mode:  [-] PRVC FiO2 (%):  [60 %-100 %] 60.1 % Set Rate:  [18 bmp-22 bmp] 20 bmp Vt Set:  [600 mL] 600 mL PEEP:  [5 cmH20-8 cmH20] 8 cmH20 Plateau Pressure:  [33 cmH20-35 cmH20] 35 cmH20  Physical Exam:  General: On ventilator, sedated, not moving at all Neuro: RASS -3 or deeper Resp: diminished breath sounds bilaterally, LLL and RLL and wheezes bilaterally, LUL and RUL CVS: Tachycardia GI: hypoactive BS Extremities: no edema, no erythema, pulses WNL and External pelvic stabilizer, Spanning external fixator of the LLE  Results for orders placed during the hospital encounter of 04/02/12 (from the past 24 hour(s))  TYPE AND SCREEN     Status: Normal (Preliminary result)   Collection Time   04/02/12 10:25 PM      Component Value Range   ABO/RH(D) A POS     Antibody Screen NEG     Sample Expiration 04/05/2012     Unit Number [id number]     Blood Component Type RED CELLS,LR     Unit division 00     Status of Unit ISSUED     Unit tag comment VERBAL ORDERS PER DR BEATON     Transfusion Status OK TO TRANSFUSE     Crossmatch Result COMPATIBLE     Unit Number [id number]     Blood Component Type RED CELLS,LR     Unit division 00     Status of Unit ISSUED     Unit tag comment VERBAL ORDERS PER DR BEATON     Transfusion Status OK TO TRANSFUSE     Crossmatch Result COMPATIBLE     Unit Number [id number]     Blood Component Type RED CELLS,LR     Unit division 00     Status of Unit ISSUED     Unit tag comment VERBAL ORDERS PER DR BEATON     Transfusion Status OK TO TRANSFUSE     Crossmatch Result COMPATIBLE     Unit Number [id number]     Blood Component Type RED CELLS,LR     Unit division 00     Status of Unit ISSUED     Unit tag comment VERBAL ORDERS PER DR BEATON     Transfusion Status OK TO TRANSFUSE     Crossmatch Result COMPATIBLE      Unit Number [id number]     Blood Component Type RED CELLS,LR     Unit division 00     Status of Unit ISSUED     Transfusion Status OK TO TRANSFUSE     Crossmatch Result Compatible     Unit Number [id number]     Blood Component Type RED CELLS,LR  Unit division 00     Status of Unit ISSUED     Transfusion Status OK TO TRANSFUSE     Crossmatch Result Compatible     Unit Number [id number]     Blood Component Type RED CELLS,LR     Unit division 00     Status of Unit REL FROM [facility]     Transfusion Status OK TO TRANSFUSE     Crossmatch Result Compatible     Unit Number [id number]     Blood Component Type RED CELLS,LR     Unit division 00     Status of Unit ALLOCATED     Transfusion Status OK TO TRANSFUSE     Crossmatch Result Compatible     Unit Number [id number]     Blood Component Type RED CELLS,LR     Unit division 00     Status of Unit ALLOCATED     Transfusion Status OK TO TRANSFUSE     Crossmatch Result Compatible     Unit Number [id number]     Blood Component Type RED CELLS,LR     Unit division 00     Status of Unit ALLOCATED     Transfusion Status OK TO TRANSFUSE     Crossmatch Result Compatible    COMPREHENSIVE METABOLIC PANEL     Status: Abnormal   Collection Time   04/02/12 10:25 PM      Component Value Range   Sodium 139  135 - 145 mEq/L   Potassium 3.3 (*) 3.5 - 5.1 mEq/L   Chloride 103  96 - 112 mEq/L   CO2 21  19 - 32 mEq/L   Glucose, Bld 146 (*) 70 - 99 mg/dL   BUN 9  6 - 23 mg/dL   Creatinine, Ser 0.10  0.50 - 1.35 mg/dL   Calcium 8.4  8.4 - 27.2 mg/dL   Total Protein 7.6  6.0 - 8.3 g/dL   Albumin 3.3 (*) 3.5 - 5.2 g/dL   AST 86 (*) 0 - 37 U/L   ALT 51  0 - 53 U/L   Alkaline Phosphatase 104  39 - 117 U/L   Total Bilirubin 0.4  0.3 - 1.2 mg/dL   GFR calc non Af Amer 81 (*) >90 mL/min   GFR calc Af Amer >90  >90 mL/min  CBC     Status: Abnormal   Collection Time   04/02/12 10:25 PM      Component Value Range   WBC 12.5 (*) 4.0 - 10.5 K/uL   RBC 4.43   4.22 - 5.81 MIL/uL   Hemoglobin 12.6 (*) 13.0 - 17.0 g/dL   HCT 53.6 (*) 64.4 - 03.4 %   MCV 87.1  78.0 - 100.0 fL   MCH 28.4  26.0 - 34.0 pg   MCHC 32.6  30.0 - 36.0 g/dL   RDW 74.2  59.5 - 63.8 %   Platelets 219  150 - 400 K/uL  PROTIME-INR     Status: Abnormal   Collection Time   04/02/12 10:25 PM      Component Value Range   Prothrombin Time 15.7 (*) 11.6 - 15.2 seconds   INR 1.22  0.00 - 1.49  ABO/RH     Status: Normal   Collection Time   04/02/12 10:25 PM      Component Value Range   ABO/RH(D) A POS    LACTIC ACID, PLASMA     Status: Abnormal   Collection Time   04/02/12 10:29 PM  Component Value Range   Lactic Acid, Venous 3.8 (*) 0.5 - 2.2 mmol/L  POCT I-STAT, CHEM 8     Status: Abnormal   Collection Time   04/02/12 10:34 PM      Component Value Range   Sodium 142  135 - 145 mEq/L   Potassium 3.4 (*) 3.5 - 5.1 mEq/L   Chloride 106  96 - 112 mEq/L   BUN 9  6 - 23 mg/dL   Creatinine, Ser 1.61 (*) 0.50 - 1.35 mg/dL   Glucose, Bld 096 (*) 70 - 99 mg/dL   Calcium, Ion 0.45 (*) 1.12 - 1.23 mmol/L   TCO2 21  0 - 100 mmol/L   Hemoglobin 14.3  13.0 - 17.0 g/dL   HCT 40.9  81.1 - 91.4 %  PREPARE FRESH FROZEN PLASMA     Status: Normal (Preliminary result)   Collection Time   04/02/12 11:14 PM      Component Value Range   Unit Number [id number]     Blood Component Type THAWED PLASMA     Unit division 00     Status of Unit ISSUED     Transfusion Status OK TO TRANSFUSE     Unit Number [id number]     Blood Component Type THAWED PLASMA     Unit division 00     Status of Unit ISSUED     Transfusion Status OK TO TRANSFUSE    POCT I-STAT 3, BLOOD GAS (G3+)     Status: Abnormal   Collection Time   04/03/12 12:20 AM      Component Value Range   pH, Arterial 7.189 (*) 7.350 - 7.450   pCO2 arterial 42.9  35.0 - 45.0 mmHg   pO2, Arterial 72.0 (*) 80.0 - 100.0 mmHg   Bicarbonate 16.3 (*) 20.0 - 24.0 mEq/L   TCO2 18  0 - 100 mmol/L   O2 Saturation 90.0     Acid-base deficit  11.0 (*) 0.0 - 2.0 mmol/L   Patient temperature 98.7 F     Collection site RADIAL, ALLEN'S TEST ACCEPTABLE     Drawn by RT     Sample type ARTERIAL     Comment NOTIFIED PHYSICIAN    PREPARE FRESH FROZEN PLASMA     Status: Normal (Preliminary result)   Collection Time   04/03/12 12:30 AM      Component Value Range   Unit Number [id number]     Blood Component Type THAWED PLASMA     Unit division 00     Status of Unit ALLOCATED     Transfusion Status OK TO TRANSFUSE     Unit Number [id number]     Blood Component Type THAWED PLASMA     Unit division 00     Status of Unit ALLOCATED     Transfusion Status OK TO TRANSFUSE     Unit Number [id number]     Blood Component Type THAWED PLASMA     Unit division 00     Status of Unit ISSUED     Transfusion Status OK TO TRANSFUSE     Unit Number [id number]     Blood Component Type THAWED PLASMA     Unit division 00     Status of Unit ISSUED     Transfusion Status OK TO TRANSFUSE    POCT I-STAT 7, (LYTES, BLD GAS, ICA,H+H)     Status: Abnormal   Collection Time   04/03/12  2:27 AM      Component Value  Range   pH, Arterial 7.224 (*) 7.350 - 7.450   pCO2 arterial 40.7  35.0 - 45.0 mmHg   pO2, Arterial 125.0 (*) 80.0 - 100.0 mmHg   Bicarbonate 17.1 (*) 20.0 - 24.0 mEq/L   TCO2 18  0 - 100 mmol/L   O2 Saturation 98.0     Acid-base deficit 10.0 (*) 0.0 - 2.0 mmol/L   Sodium 144  135 - 145 mEq/L   Potassium 3.0 (*) 3.5 - 5.1 mEq/L   Calcium, Ion 1.01 (*) 1.12 - 1.23 mmol/L   HCT 25.0 (*) 39.0 - 52.0 %   Hemoglobin 8.5 (*) 13.0 - 17.0 g/dL   Patient temperature 21.3 C     Sample type ARTERIAL    POCT I-STAT 4, (NA,K, GLUC, HGB,HCT)     Status: Abnormal   Collection Time   04/03/12  3:35 AM      Component Value Range   Sodium 147 (*) 135 - 145 mEq/L   Potassium 3.2 (*) 3.5 - 5.1 mEq/L   Glucose, Bld 149 (*) 70 - 99 mg/dL   HCT 08.6 (*) 57.8 - 46.9 %   Hemoglobin 9.9 (*) 13.0 - 17.0 g/dL  POCT I-STAT 7, (LYTES, BLD GAS, ICA,H+H)     Status:  Abnormal   Collection Time   04/03/12  3:49 AM      Component Value Range   pH, Arterial 7.229 (*) 7.350 - 7.450   pCO2 arterial 43.9  35.0 - 45.0 mmHg   pO2, Arterial 133.0 (*) 80.0 - 100.0 mmHg   Bicarbonate 18.6 (*) 20.0 - 24.0 mEq/L   TCO2 20  0 - 100 mmol/L   O2 Saturation 99.0     Acid-base deficit 9.0 (*) 0.0 - 2.0 mmol/L   Sodium 145  135 - 145 mEq/L   Potassium 3.0 (*) 3.5 - 5.1 mEq/L   Calcium, Ion 0.95 (*) 1.12 - 1.23 mmol/L   HCT 28.0 (*) 39.0 - 52.0 %   Hemoglobin 9.5 (*) 13.0 - 17.0 g/dL   Patient temperature 62.9 C     Sample type ARTERIAL    LACTIC ACID, PLASMA     Status: Abnormal   Collection Time   04/03/12  3:55 AM      Component Value Range   Lactic Acid, Venous 6.1 (*) 0.5 - 2.2 mmol/L  POCT I-STAT 7, (LYTES, BLD GAS, ICA,H+H)     Status: Abnormal   Collection Time   04/03/12  4:30 AM      Component Value Range   pH, Arterial 7.241 (*) 7.350 - 7.450   pCO2 arterial 47.4 (*) 35.0 - 45.0 mmHg   pO2, Arterial 135.0 (*) 80.0 - 100.0 mmHg   Bicarbonate 20.7  20.0 - 24.0 mEq/L   TCO2 22  0 - 100 mmol/L   O2 Saturation 99.0     Acid-base deficit 7.0 (*) 0.0 - 2.0 mmol/L   Sodium 146 (*) 135 - 145 mEq/L   Potassium 3.2 (*) 3.5 - 5.1 mEq/L   Calcium, Ion 0.99 (*) 1.12 - 1.23 mmol/L   HCT 27.0 (*) 39.0 - 52.0 %   Hemoglobin 9.2 (*) 13.0 - 17.0 g/dL   Patient temperature 52.8 C     Sample type ARTERIAL    PREPARE FRESH FROZEN PLASMA     Status: Normal (Preliminary result)   Collection Time   04/03/12  5:12 AM      Component Value Range   Unit Number [id number]     Blood Component Type  THAWED PLASMA     Unit division 00     Status of Unit ISSUED     Transfusion Status OK TO TRANSFUSE     Unit Number [id number]     Blood Component Type THAWED PLASMA     Unit division 00     Status of Unit ISSUED     Transfusion Status OK TO TRANSFUSE    MRSA PCR SCREENING     Status: Abnormal   Collection Time   04/03/12  5:45 AM      Component Value Range   MRSA by PCR  POSITIVE (*) NEGATIVE  CBC     Status: Abnormal   Collection Time   04/03/12  5:50 AM      Component Value Range   WBC 9.8  4.0 - 10.5 K/uL   RBC 3.13 (*) 4.22 - 5.81 MIL/uL   Hemoglobin 9.2 (*) 13.0 - 17.0 g/dL   HCT 40.9 (*) 81.1 - 91.4 %   MCV 85.6  78.0 - 100.0 fL   MCH 29.4  26.0 - 34.0 pg   MCHC 34.3  30.0 - 36.0 g/dL   RDW 78.2  95.6 - 21.3 %   Platelets 84 (*) 150 - 400 K/uL  COMPREHENSIVE METABOLIC PANEL     Status: Abnormal   Collection Time   04/03/12  5:50 AM      Component Value Range   Sodium 145  135 - 145 mEq/L   Potassium 3.2 (*) 3.5 - 5.1 mEq/L   Chloride 110  96 - 112 mEq/L   CO2 22  19 - 32 mEq/L   Glucose, Bld 138 (*) 70 - 99 mg/dL   BUN 9  6 - 23 mg/dL   Creatinine, Ser 0.86  0.50 - 1.35 mg/dL   Calcium 6.6 (*) 8.4 - 10.5 mg/dL   Total Protein 4.6 (*) 6.0 - 8.3 g/dL   Albumin 2.2 (*) 3.5 - 5.2 g/dL   AST 578 (*) 0 - 37 U/L   ALT 39  0 - 53 U/L   Alkaline Phosphatase 57  39 - 117 U/L   Total Bilirubin 0.6  0.3 - 1.2 mg/dL   GFR calc non Af Amer >90  >90 mL/min   GFR calc Af Amer >90  >90 mL/min  APTT     Status: Normal   Collection Time   04/03/12  5:50 AM      Component Value Range   aPTT 31  24 - 37 seconds  PROTIME-INR     Status: Abnormal   Collection Time   04/03/12  5:50 AM      Component Value Range   Prothrombin Time 17.8 (*) 11.6 - 15.2 seconds   INR 1.44  0.00 - 1.49  POCT I-STAT 3, BLOOD GAS (G3+)     Status: Abnormal   Collection Time   04/03/12  5:55 AM      Component Value Range   pH, Arterial 7.313 (*) 7.350 - 7.450   pCO2 arterial 44.0  35.0 - 45.0 mmHg   pO2, Arterial 139.0 (*) 80.0 - 100.0 mmHg   Bicarbonate 22.6  20.0 - 24.0 mEq/L   TCO2 24  0 - 100 mmol/L   O2 Saturation 99.0     Acid-base deficit 4.0 (*) 0.0 - 2.0 mmol/L   Patient temperature 35.9 C     Collection site ARTERIAL LINE     Drawn by Nurse     Sample type ARTERIAL    POCT I-STAT, CHEM  8     Status: Abnormal   Collection Time   04/03/12  6:02 AM       Component Value Range   Sodium 145  135 - 145 mEq/L   Potassium 3.3 (*) 3.5 - 5.1 mEq/L   Chloride 109  96 - 112 mEq/L   BUN 8  6 - 23 mg/dL   Creatinine, Ser 4.09  0.50 - 1.35 mg/dL   Glucose, Bld 811 (*) 70 - 99 mg/dL   Calcium, Ion 9.14 (*) 1.12 - 1.23 mmol/L   TCO2 22  0 - 100 mmol/L   Hemoglobin 8.2 (*) 13.0 - 17.0 g/dL   HCT 78.2 (*) 95.6 - 21.3 %  POCT I-STAT 3, BLOOD GAS (G3+)     Status: Abnormal   Collection Time   04/03/12  6:59 AM      Component Value Range   pH, Arterial 7.349 (*) 7.350 - 7.450   pCO2 arterial 42.3  35.0 - 45.0 mmHg   pO2, Arterial 115.0 (*) 80.0 - 100.0 mmHg   Bicarbonate 23.5  20.0 - 24.0 mEq/L   TCO2 25  0 - 100 mmol/L   O2 Saturation 98.0     Acid-base deficit 2.0  0.0 - 2.0 mmol/L   Patient temperature 36.3 C     Collection site ARTERIAL LINE     Drawn by Nurse     Sample type ARTERIAL    URINALYSIS, WITH MICROSCOPIC     Status: Abnormal   Collection Time   04/03/12  7:04 AM      Component Value Range   Color, Urine YELLOW  YELLOW   APPearance CLEAR  CLEAR   Specific Gravity, Urine 1.010  1.005 - 1.030   pH 6.5  5.0 - 8.0   Glucose, UA NEGATIVE  NEGATIVE mg/dL   Hgb urine dipstick LARGE (*) NEGATIVE   Bilirubin Urine NEGATIVE  NEGATIVE   Ketones, ur NEGATIVE  NEGATIVE mg/dL   Protein, ur NEGATIVE  NEGATIVE mg/dL   Urobilinogen, UA 0.2  0.0 - 1.0 mg/dL   Nitrite NEGATIVE  NEGATIVE   Leukocytes, UA NEGATIVE  NEGATIVE   WBC, UA 0-2  <3 WBC/hpf   RBC / HPF 11-20  <3 RBC/hpf   Bacteria, UA RARE  RARE   Squamous Epithelial / LPF RARE  RARE     Assessment/Plan:   NEURO  Altered Mental Status:  sedation   Plan: Try to increase sedation because the patient appears to be hypertensive because of being on the vent.   PULM  Atelectasis/collapse (focal and RUL) Lung Trauma (left and with contusion of lung) and Pneumothorax (traumatic)   Plan: Needs bronchoscopy this AM because of collapsed RUL  CARDIO  Sinus Tachycardia   Plan:  Physiologic, needs volume resuscitation.  RENAL  Adequated urine output, but will need to monitor CVP to know if more blood or fluid is necessary.   Plan: CHeck CVP, check Hgb  GI  Soft with hypoactive bowel sounds.   Plan: No plans for enteral nutrition currently.  ID  Had open fractures, on prohylactic antibiotic currently   Plan: Continue antibiotics and add Gentamicin.  HEME  Anemia acute blood loss anemia)   Plan: Hgb 8.2, no blood to be given currently.  Will monitor.  Platelets low.  ENDO No specific issues   Plan: No changes  Global Issues  Multiple injuries orthopedically that are being addressed.  Had mucous plugging this AM based on CXR.  Needs DVT prophylaxis.  Cannot give  Lovenox currently.      LOS: 1 day   Additional comments:I reviewed the patient's new clinical lab test results. cbc/abg/bmet and I reviewed the patients new imaging test results. cxr  Critical Care Total Time*: 45 Minutes  Clary Boulais O 04/03/2012  *Care during the described time interval was provided by me and/or other providers on the critical care team.  I have reviewed this patient's available data, including medical history, events of note, physical examination and test results as part of my evaluation.

## 2012-04-03 NOTE — Progress Notes (Signed)
UR complete 
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
## 2012-04-03 NOTE — Transfer of Care (Signed)
Immediate Anesthesia Transfer of Care Note  Patient: Miguel Brady  Procedure(s) Performed: Procedure(s) (LRB): EXTERNAL FIXATION PELVIS () EXTERNAL FIXATION LEG (Left) CHEST TUBE INSERTION (Left) IRRIGATION AND DEBRIDEMENT WOUND (N/A) SCALP LACERATION REPAIR (N/A)  Patient Location: SICU  Anesthesia Type: General  Level of Consciousness: sedated, unresponsive and Patient remains intubated per anesthesia plan  Airway & Oxygen Therapy: Patient remains intubated per anesthesia plan and Patient placed on Ventilator (see vital sign flow sheet for setting)  Post-op Assessment: Report given to SICU RN and Post -op Vital signs reviewed and stable  Post vital signs: Reviewed and stable  Complications: No apparent anesthesia complications
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
## 2012-04-04 ENCOUNTER — Inpatient Hospital Stay (HOSPITAL_COMMUNITY): Payer: No Typology Code available for payment source

## 2012-04-04 LAB — BLOOD GAS, ARTERIAL
Acid-Base Excess: 0.8 mmol/L (ref 0.0–2.0)
Drawn by: 34513
FIO2: 0.4 %
MECHVT: 600 mL
O2 Saturation: 97.6 %
PEEP: 8 cmH2O
Patient temperature: 98.6
RATE: 20 resp/min

## 2012-04-04 LAB — COMPREHENSIVE METABOLIC PANEL
CO2: 24 mEq/L (ref 19–32)
Calcium: 6.5 mg/dL — ABNORMAL LOW (ref 8.4–10.5)
Creatinine, Ser: 1.47 mg/dL — ABNORMAL HIGH (ref 0.50–1.35)
GFR calc Af Amer: 63 mL/min — ABNORMAL LOW (ref >90)
GFR calc non Af Amer: 54 mL/min — ABNORMAL LOW (ref >90)
Glucose, Bld: 130 mg/dL — ABNORMAL HIGH (ref 70–99)

## 2012-04-04 LAB — APTT: aPTT: 36 seconds (ref 24–37)

## 2012-04-04 LAB — PREPARE FRESH FROZEN PLASMA
Unit division: 0
Unit division: 0
Unit division: 0
Unit division: 0

## 2012-04-04 LAB — CBC WITH DIFFERENTIAL/PLATELET
Hemoglobin: 7.3 g/dL — ABNORMAL LOW (ref 13.0–17.0)
Lymphocytes Relative: 19 % (ref 12–46)
Lymphs Abs: 2 10*3/uL (ref 0.7–4.0)
MCH: 28.9 pg (ref 26.0–34.0)
Monocytes Relative: 11 % (ref 3–12)
Neutrophils Relative %: 70 % (ref 43–77)
Platelets: 73 10*3/uL — ABNORMAL LOW (ref 150–400)
RBC: 2.53 MIL/uL — ABNORMAL LOW (ref 4.22–5.81)
WBC: 10.9 10*3/uL — ABNORMAL HIGH (ref 4.0–10.5)
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
## 2012-04-05 ENCOUNTER — Inpatient Hospital Stay (HOSPITAL_COMMUNITY): Payer: No Typology Code available for payment source

## 2012-04-05 LAB — CBC
HCT: 23.4 % — ABNORMAL LOW (ref 39.0–52.0)
MCH: 28.7 pg (ref 26.0–34.0)
MCHC: 33.8 g/dL (ref 30.0–36.0)
MCV: 85.1 fL (ref 78.0–100.0)
Platelets: 74 10*3/uL — ABNORMAL LOW (ref 150–400)
RDW: 15.9 % — ABNORMAL HIGH (ref 11.5–15.5)
WBC: 11.9 10*3/uL — ABNORMAL HIGH (ref 4.0–10.5)

## 2012-04-05 LAB — BASIC METABOLIC PANEL
BUN: 18 mg/dL (ref 6–23)
Calcium: 6.7 mg/dL — ABNORMAL LOW (ref 8.4–10.5)
Chloride: 116 mEq/L — ABNORMAL HIGH (ref 96–112)
Creatinine, Ser: 1.46 mg/dL — ABNORMAL HIGH (ref 0.50–1.35)
GFR calc Af Amer: 63 mL/min — ABNORMAL LOW (ref >90)
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
## 2012-04-06 ENCOUNTER — Inpatient Hospital Stay (HOSPITAL_COMMUNITY): Payer: No Typology Code available for payment source

## 2012-04-06 LAB — TYPE AND SCREEN
Unit division: 0
Unit division: 0
Unit division: 0
Unit division: 0
Unit division: 0

## 2012-04-06 LAB — PREPARE PLATELET PHERESIS: Unit division: 0

## 2012-04-06 LAB — BASIC METABOLIC PANEL
CO2: 23 mEq/L (ref 19–32)
Calcium: 7 mg/dL — ABNORMAL LOW (ref 8.4–10.5)
Chloride: 114 mEq/L — ABNORMAL HIGH (ref 96–112)
Creatinine, Ser: 1.21 mg/dL (ref 0.50–1.35)
GFR calc Af Amer: 79 mL/min — ABNORMAL LOW (ref >90)
Sodium: 147 mEq/L — ABNORMAL HIGH (ref 135–145)

## 2012-04-06 LAB — CBC
MCV: 85.5 fL (ref 78.0–100.0)
Platelets: 97 10*3/uL — ABNORMAL LOW (ref 150–400)
RBC: 3.03 MIL/uL — ABNORMAL LOW (ref 4.22–5.81)
RDW: 16.6 % — ABNORMAL HIGH (ref 11.5–15.5)
WBC: 13.1 10*3/uL — ABNORMAL HIGH (ref 4.0–10.5)

## 2012-04-06 MED ORDER — CEFAZOLIN SODIUM 10 G IJ SOLR
3.0000 g | Freq: Once | INTRAMUSCULAR | Status: AC
  Administered 2012-04-06 – 2012-04-07 (×2): 3 g via INTRAVENOUS
  Administered 2012-04-07: 2 g via INTRAVENOUS
  Filled 2012-04-06: qty 3000

## 2012-04-06 MED ORDER — VECURONIUM BROMIDE 10 MG IV SOLR
INTRAVENOUS | Status: AC
  Filled 2012-04-06: qty 10

## 2012-04-06 MED ORDER — ALBUTEROL SULFATE HFA 108 (90 BASE) MCG/ACT IN AERS
6.0000 | INHALATION_SPRAY | Freq: Four times a day (QID) | RESPIRATORY_TRACT | Status: DC
  Administered 2012-04-06 – 2012-04-19 (×51): 6 via RESPIRATORY_TRACT
  Filled 2012-04-06 (×5): qty 6.7

## 2012-04-06 MED ORDER — ALBUTEROL SULFATE HFA 108 (90 BASE) MCG/ACT IN AERS
6.0000 | INHALATION_SPRAY | Freq: Four times a day (QID) | RESPIRATORY_TRACT | Status: DC
  Filled 2012-04-06: qty 6.7

## 2012-04-06 MED ORDER — PIVOT 1.5 CAL PO LIQD
1000.0000 mL | ORAL | Status: DC
Start: 2012-04-06 — End: 2012-04-08
  Administered 2012-04-06 – 2012-04-07 (×2): 1000 mL
  Filled 2012-04-06 (×2): qty 1000

## 2012-04-06 MED ORDER — KCL IN DEXTROSE-NACL 10-5-0.45 MEQ/L-%-% IV SOLN
INTRAVENOUS | Status: DC
  Administered 2012-04-06: 09:00:00 via INTRAVENOUS
  Administered 2012-04-06: 100 mL via INTRAVENOUS
  Administered 2012-04-08 – 2012-04-11 (×3): via INTRAVENOUS
  Administered 2012-04-13 – 2012-04-15 (×2): 20 mL/h via INTRAVENOUS
  Administered 2012-04-18: 03:00:00 via INTRAVENOUS
  Filled 2012-04-06 (×15): qty 1000

## 2012-04-06 MED ORDER — FUROSEMIDE 10 MG/ML IJ SOLN
20.0000 mg | Freq: Once | INTRAMUSCULAR | Status: AC
  Administered 2012-04-06: 20 mg via INTRAVENOUS
  Filled 2012-04-06: qty 2

## 2012-04-06 MED ORDER — FUROSEMIDE 10 MG/ML IJ SOLN
20.0000 mg | Freq: Once | INTRAMUSCULAR | Status: AC
  Administered 2012-04-06: 20 mg via INTRAVENOUS
  Filled 2012-04-06 (×2): qty 2
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
## 2012-04-06 NOTE — Progress Notes (Signed)
Returned pt from CT.CT trip done with no complications. Pt tol well. Pt remains on same full support settings PRVC/RR20/VT 600/Peep 8/40%.
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
## 2012-04-06 NOTE — Progress Notes (Signed)
Follow up - Trauma and Critical Care  Patient Details:    Miguel Brady is an 51 y.o. male.  Lines/tubes : AIRWAYS 7.5 mm (Active)  Secured at (cm) 23 cm 04/02/2012 12:00 AM     Airway 7.5 mm (Active)  Secured at (cm) 21 cm 04/06/2012  3:30 AM  Measured From Lips 04/06/2012  3:30 AM  Secured Location Right 04/06/2012  3:30 AM  Secured By [employer] 04/06/2012  3:30 AM  Tube Holder Repositioned Yes 04/06/2012  3:30 AM  Cuff Pressure (cm H2O) 22 cm H2O 04/06/2012  3:30 AM  Site Condition Erythema 04/03/2012 12:34 AM     CVC Double Lumen 04/03/12 Left Subclavian (Active)  Site Assessment Clean;Intact;Dry 04/05/2012  8:00 AM  Proximal Lumen Status Infusing 04/05/2012  2:00 AM  Distal Lumen Status Infusing 04/05/2012  2:00 AM  Dressing Type Transparent;Occlusive 04/05/2012  8:00 AM  Dressing Status Clean;Dry;Intact 04/05/2012  8:00 AM     Arterial Line 04/03/12 Right Radial (Active)  Site Assessment Clean;Dry;Intact 04/05/2012  8:00 PM  Line Status Pulsatile blood flow 04/05/2012  8:00 PM  Art Line Waveform Appropriate 04/05/2012  8:00 PM  Art Line Interventions Connections checked and tightened 04/05/2012  8:00 PM  Color/Movement/Sensation Capillary refill less than 3 sec 04/05/2012  8:00 PM  Dressing Type Transparent;Occlusive;Non-occlusive 04/05/2012  8:00 PM  Dressing Status Clean;Dry;Intact 04/05/2012  8:00 PM     Chest Tube 1 Left Pleural (Active)  Suction -20 cm H2O 04/05/2012  8:00 PM  Chest Tube Air Leak None 04/05/2012  8:00 PM  Patency Intervention Tip/tilt 04/05/2012  8:00 PM  Drainage Description Serosanguineous 04/05/2012  8:00 PM  Dressing Status Clean;Dry;Intact 04/05/2012  8:00 PM  Dressing Intervention New dressing 04/03/2012  4:00 PM  Site Assessment Clean;Dry;Intact 04/05/2012  8:00 PM  Surrounding Skin Unable to view 04/05/2012  8:00 PM  Output (mL) 25 mL 04/06/2012  4:00 AM     Negative Pressure Wound Therapy Leg Left (Active)  Dressing Status Intact 04/03/2012  8:00  AM  Drainage Amount Minimal 04/03/2012  8:00 AM  Drainage Description Serosanguineous 04/05/2012  8:00 PM  Output (mL) 25 mL 04/06/2012  4:00 AM     NG/OG Tube Orogastric 16 Fr. Center mouth (Active)  Placement Verification Auscultation 04/05/2012  8:00 PM  Site Assessment Clean;Dry;Intact 04/05/2012  8:00 PM  Status Irrigated;Suction-low intermittent 04/05/2012  8:00 PM  Drainage Appearance Bile 04/05/2012  8:00 PM  Intake (mL) 20 mL 04/05/2012 12:00 PM  Output (mL) 150 mL 04/06/2012  6:26 AM     Urethral Catheter Non-latex;Temperature probe (Active)  Indication for Insertion or Continuance of Catheter Urinary output monitoring 04/05/2012  8:00 PM  Site Assessment Clean;Intact 04/05/2012  8:00 PM  Collection Container Standard drainage bag 04/05/2012  8:00 PM  Securement Method Leg strap 04/05/2012  8:00 PM  Urinary Catheter Interventions Unclamped 04/05/2012  8:00 PM  Output (mL) 50 mL 04/06/2012  7:00 AM    Microbiology/Sepsis markers: Results for orders placed during the hospital encounter of 04/02/12  MRSA PCR SCREENING     Status: Abnormal   Collection Time   04/03/12  5:45 AM      Component Value Range Status Comment   MRSA by PCR POSITIVE (*) NEGATIVE Final     Anti-infectives:  Anti-infectives     Start     Dose/Rate Route Frequency Ordered Stop   04/03/12 1400   ceFAZolin (ANCEF) IVPB 1 g/50 mL premix        1 g 100  mL/hr over 30 Minutes Intravenous 3 times per day 04/03/12 0930 04/07/12 1359   04/03/12 1200   gentamicin (GARAMYCIN) 700 mg in dextrose 5 % 100 mL IVPB        700 mg 117.5 mL/hr over 60 Minutes Intravenous Every 24 hours 04/03/12 1035 04/04/12 1340   04/03/12 0600   ceFAZolin (ANCEF) IVPB 1 g/50 mL premix  Status:  Discontinued        1 g 100 mL/hr over 30 Minutes Intravenous 3 times per day 04/03/12 0550 04/03/12 0930   04/03/12 0249   polymyxin B 500,000 Units, bacitracin 50,000 Units in sodium chloride irrigation 0.9 % 500 mL irrigation  Status:  Discontinued           As needed 04/03/12 0249 04/03/12 0528   04/02/12 2315   ceFAZolin (ANCEF) IVPB 1 g/50 mL premix        1 g 100 mL/hr over 30 Minutes Intravenous  Once 04/02/12 2304 04/03/12 0125          Best Practice/Protocols:  VTE Prophylaxis: Mechanical GI Prophylaxis: Proton Pump Inhibitor Continous Sedation  Consults: Treatment Team:  Harvie Junior, MD Clydene Fake, MD Budd Palmer, MD    Events:  Subjective:    Overnight Issues: No specific issues overnight.  Objective:  Vital signs for last 24 hours: Temp:  [98.2 F (36.8 C)-99.7 F (37.6 C)] 98.4 F (36.9 C) (07/22 0700) Pulse Rate:  [100-124] 100  (07/22 0700) Resp:  [0-21] 19  (07/22 0700) BP: (118-169)/(70-104) 120/71 mmHg (07/22 0700) SpO2:  [98 %-100 %] 100 % (07/22 0700) Arterial Line BP: (123-195)/(68-98) 128/75 mmHg (07/22 0700) FiO2 (%):  [39.8 %-40.2 %] 40 % (07/22 0700)  Hemodynamic parameters for last 24 hours: CVP:  [8 mmHg-9 mmHg] 9 mmHg  Intake/Output from previous day: 07/21 0701 - 07/22 0700 In: 2435.3 [I.V.:1675.3; Blood:620; NG/GT:40; IV Piggyback:100] Out: 1240 [Urine:890; Emesis/NG output:190; Drains:50; Chest Tube:110]  Intake/Output this shift:    Vent settings for last 24 hours: Vent Mode:  [-] PRVC FiO2 (%):  [39.8 %-40.2 %] 40 % Set Rate:  [20 bmp] 20 bmp Vt Set:  [600 mL] 600 mL PEEP:  [8 cmH20] 8 cmH20 Plateau Pressure:  [30 cmH20-35 cmH20] 35 cmH20  Physical Exam:  General: Peak airway pressures are increased, but patient is not fighting the ventilator Neuro: RASS -2 and Will grimaces and open eyes to tactile stimulus Resp: diminished breath sounds bilaterally, LLL and RLL and wheezes apex - bilateral and bilaterally CVS: Mild tachycardia GI: hypoactive BS and soft, not getting any nutrition. Extremities: Splinted and wrapped in Ace.  External fixator in place on the pelvis and LLE  Results for orders placed during the hospital encounter of 04/02/12 (from the  past 24 hour(s))  PREPARE PLATELET PHERESIS     Status: Normal   Collection Time   04/05/12 10:33 AM      Component Value Range   Unit Number [id number]     Blood Component Type PLTPHER LR1     Unit division 00     Status of Unit ISSUED,FINAL     Transfusion Status OK TO TRANSFUSE    CBC     Status: Abnormal   Collection Time   04/06/12  4:00 AM      Component Value Range   WBC 13.1 (*) 4.0 - 10.5 K/uL   RBC 3.03 (*) 4.22 - 5.81 MIL/uL   Hemoglobin 8.8 (*) 13.0 - 17.0 g/dL   HCT 86.5 (*)  39.0 - 52.0 %   MCV 85.5  78.0 - 100.0 fL   MCH 29.0  26.0 - 34.0 pg   MCHC 34.0  30.0 - 36.0 g/dL   RDW 09.8 (*) 11.9 - 14.7 %   Platelets 97 (*) 150 - 400 K/uL  BASIC METABOLIC PANEL     Status: Abnormal   Collection Time   04/06/12  4:00 AM      Component Value Range   Sodium 147 (*) 135 - 145 mEq/L   Potassium 4.0  3.5 - 5.1 mEq/L   Chloride 114 (*) 96 - 112 mEq/L   CO2 23  19 - 32 mEq/L   Glucose, Bld 93  70 - 99 mg/dL   BUN 15  6 - 23 mg/dL   Creatinine, Ser 8.29  0.50 - 1.35 mg/dL   Calcium 7.0 (*) 8.4 - 10.5 mg/dL   GFR calc non Af Amer 68 (*) >90 mL/min   GFR calc Af Amer 79 (*) >90 mL/min     Assessment/Plan:   NEURO  Altered Mental Status:  sedation   Plan: Continue sedation.  PULM  Atelectasis/collapse (focal and in the RUL, same as previously)   Plan: Bronchoscopy again today to try to open up RUL  CARDIO  Sinus Tachycardia   Plan: No specific treatment  RENAL  Oliguria (etiology unknown)   Plan: Scheduled to get lasix today at 10 although creatinine is up to 1.27  GI  No specific abnormality, but not getting any enteral nutrition   Plan: Start trickle tube feedings  ID  No specific infection noted, but will get pulmonary BAL today.   Plan: Bronchoscopy today with BAL  HEME  Anemia acute blood loss anemia and anemia of critical illness) Leukocytosis (neutrophilia)   Plan: Track blood loss, look for infectious source.  ENDO No specific issues   Plan: CPM    Global Issues  The patient has low grade fevers, increased WBC.  Possible sources for infection are his left subclavian central line and pulmonary.  Will DC central line and have PICC placed, and will scope patient and send BAL.  Due to go to the OR tomorrow.  No bronchodilators have been given.  Will start albuterol for wheezing, and it may assist [place] in opening up his RUL     LOS: 4 days   Additional comments:I reviewed the patient's new clinical lab test results. cbc/bmet and I reviewed the patients new imaging test results. cxr  Critical Care Total Time*: 45 Minutes  Daniyal Tabor O 04/06/2012  *Care during the described time interval was provided by me and/or other providers on the critical care team.  I have reviewed this patient's available data, including medical history, events of note, physical examination and test results as part of my evaluation.

## 2012-04-06 NOTE — Progress Notes (Addendum)
Nutrition Consult/Follow-up  Intervention:    Pivot 1.5 formula at 20 ml/hr per MD order  Once medically appropriate, increase by 10 ml every 4 hours to goal rate of 40 ml/hr with Prostat liquid protein 30 ml 3 times daily via tube to provide 1740 kcals (67% of estimated kcals), 135 gm protein (90% of estimated protein needs), 729 ml of free water RD to follow for nutrition care plan  Assessment:   RD consult for trickle feeding initiation. Pivot 1.5 formula ordered at 20 ml/hr. Initial assessment completed 7/19. Patient remains intubated & sedated. OGT in place. Fentanyl drip. For OR tomorrow.  Diet Order:  NPO  Meds: Scheduled Meds:   . albuterol  6 puff Inhalation Q6H  . antiseptic oral rinse  15 mL Mouth Rinse QID  .  ceFAZolin (ANCEF) IV  3 g Intravenous Once  .  ceFAZolin (ANCEF) IV  1 g Intravenous Q8H  . chlorhexidine  15 mL Mouth Rinse BID  . Chlorhexidine Gluconate Cloth  6 each Topical Q0600  . furosemide  20 mg Intravenous Once  . furosemide  20 mg Intravenous Once  . mupirocin ointment  1 application Nasal BID  . pantoprazole  40 mg Oral Q1200   Or  . pantoprazole (PROTONIX) IV  40 mg Intravenous Q1200  . DISCONTD: albuterol  6 puff Inhalation Q6H   Continuous Infusions:   . dextrose 5 % and 0.45 % NaCl with KCl 10 mEq/L 100 mL/hr at 04/06/12 1200  . feeding supplement (PIVOT 1.5 CAL) 1,000 mL (04/06/12 1129)  . fentaNYL infusion INTRAVENOUS 150 mcg/hr (04/06/12 1200)  . midazolam (VERSED) infusion 5 mg/hr (04/06/12 1200)  . DISCONTD: 0.9 % NaCl with KCl 20 mEq / L 100 mL/hr at 04/06/12 0700   PRN Meds:.fentaNYL, midazolam, ondansetron (ZOFRAN) IV, ondansetron  Labs:  CMP     Component Value Date/Time   NA 147* 04/06/2012 0400   K 4.0 04/06/2012 0400   CL 114* 04/06/2012 0400   CO2 23 04/06/2012 0400   GLUCOSE 93 04/06/2012 0400   BUN 15 04/06/2012 0400   CREATININE 1.21 04/06/2012 0400   CALCIUM 7.0* 04/06/2012 0400   PROT 4.9* 04/04/2012 0400   ALBUMIN 2.1*  04/04/2012 0400   AST 118* 04/04/2012 0400   ALT 34 04/04/2012 0400   ALKPHOS 53 04/04/2012 0400   BILITOT 0.9 04/04/2012 0400   GFRNONAA 68* 04/06/2012 0400   GFRAA 79* 04/06/2012 0400     Intake/Output Summary (Last 24 hours) at 04/06/12 1218 Last data filed at 04/06/12 1200  Gross per 24 hour  Intake 2491.16 ml  Output   1340 ml  Net 1151.16 ml    Weight Status:  141.1 kg (7/21) -- trending up  Re-estimated needs:  2600 kcals, 150-160 gm protein  Nutrition Dx:  Inadequate Oral Intake, ongoing  Goal: EN to provide 60-70% of estimated calorie needs (22-25 kcals/kg ideal body weight) and >/= 90% of estimated protein needs, based on ASPEN guidelines for permissive underfeeding in critically ill obese individuals, progressing  Monitor:  EN regimen & tolerance, respiratory status, weight, labs, I/O's  K[place]land Hun, RD, LDN Pager #: [phone] After-Hours Pager #: [phone]

## 2012-04-06 NOTE — Progress Notes (Signed)
No wean with high Plat. MD aware, bronch scheduled at 1000

## 2012-04-07 ENCOUNTER — Inpatient Hospital Stay (HOSPITAL_COMMUNITY): Payer: No Typology Code available for payment source

## 2012-04-07 ENCOUNTER — Encounter (HOSPITAL_COMMUNITY): Payer: Self-pay | Admitting: Anesthesiology

## 2012-04-07 ENCOUNTER — Inpatient Hospital Stay (HOSPITAL_COMMUNITY): Payer: No Typology Code available for payment source | Admitting: Anesthesiology

## 2012-04-07 ENCOUNTER — Encounter (HOSPITAL_COMMUNITY): Admission: EM | Disposition: A | Discharge status: Rehabilitation Facility | Payer: Self-pay | Source: Home / Self Care

## 2012-04-07 HISTORY — PX: ORIF PELVIC FRACTURE: SHX2128

## 2012-04-07 HISTORY — PX: FLEXIBLE BRONCHOSCOPY: SHX5094

## 2012-04-07 HISTORY — PX: ORIF PATELLA: SHX5033

## 2012-04-07 HISTORY — PX: TIBIA IM NAIL INSERTION: SHX2516

## 2012-04-07 HISTORY — PX: FEMUR IM NAIL: SHX1597

## 2012-04-07 LAB — CBC
Hemoglobin: 9 g/dL — ABNORMAL LOW (ref 13.0–17.0)
MCHC: 34.1 g/dL (ref 30.0–36.0)
Platelets: 84 10*3/uL — ABNORMAL LOW (ref 150–400)

## 2012-04-07 LAB — CBC WITH DIFFERENTIAL/PLATELET
Basophils Relative: 0 % (ref 0–1)
Eosinophils Absolute: 0.2 10*3/uL (ref 0.0–0.7)
MCH: 27.8 pg (ref 26.0–34.0)
MCHC: 33.8 g/dL (ref 30.0–36.0)
Monocytes Relative: 11 % (ref 3–12)
Neutrophils Relative %: 67 % (ref 43–77)
Platelets: 99 10*3/uL — ABNORMAL LOW (ref 150–400)

## 2012-04-07 LAB — BASIC METABOLIC PANEL
BUN: 14 mg/dL (ref 6–23)
Calcium: 7.6 mg/dL — ABNORMAL LOW (ref 8.4–10.5)
Creatinine, Ser: 1.16 mg/dL (ref 0.50–1.35)
GFR calc non Af Amer: 72 mL/min — ABNORMAL LOW (ref >90)
Glucose, Bld: 129 mg/dL — ABNORMAL HIGH (ref 70–99)

## 2012-04-07 LAB — POCT I-STAT 3, ART BLOOD GAS (G3+)
O2 Saturation: 94 %
O2 Saturation: 92 %
pCO2 arterial: 54.1 mmHg — ABNORMAL HIGH (ref 35.0–45.0)
pCO2 arterial: 37.3 mmHg (ref 35.0–45.0)
pH, Arterial: 7.306 — ABNORMAL LOW (ref 7.350–7.450)
pH, Arterial: 7.424 (ref 7.350–7.450)

## 2012-04-07 SURGERY — INSERTION, INTRAMEDULLARY ROD, TIBIA
Anesthesia: General | Site: Pelvis | Wound class: Clean

## 2012-04-07 MED ORDER — LACTATED RINGERS IV SOLN
INTRAVENOUS | Status: DC | PRN
  Administered 2012-04-07 (×3): via INTRAVENOUS

## 2012-04-07 MED ORDER — LABETALOL HCL 5 MG/ML IV SOLN: 20.0000 mg | Freq: Once | INTRAVENOUS | Status: DC

## 2012-04-07 MED ORDER — ROCURONIUM BROMIDE 100 MG/10ML IV SOLN
INTRAVENOUS | Status: DC | PRN
  Administered 2012-04-07: 50 mg via INTRAVENOUS

## 2012-04-07 MED ORDER — PROPOFOL 10 MG/ML IV EMUL
INTRAVENOUS | Status: DC | PRN
  Administered 2012-04-07: 50 mg via INTRAVENOUS

## 2012-04-07 MED ORDER — SODIUM CHLORIDE 0.9 % IR SOLN
Status: DC | PRN
  Administered 2012-04-07: 6000 mL

## 2012-04-07 MED ORDER — ALBUTEROL SULFATE HFA 108 (90 BASE) MCG/ACT IN AERS
INHALATION_SPRAY | RESPIRATORY_TRACT | Status: DC | PRN
  Administered 2012-04-07: 5 via RESPIRATORY_TRACT
  Administered 2012-04-07: 2 via RESPIRATORY_TRACT

## 2012-04-07 MED ORDER — MIDAZOLAM HCL 5 MG/5ML IJ SOLN
INTRAMUSCULAR | Status: DC | PRN
  Administered 2012-04-07: 2 mg via INTRAVENOUS

## 2012-04-07 MED ORDER — 0.9 % SODIUM CHLORIDE (POUR BTL) OPTIME
TOPICAL | Status: DC | PRN
  Administered 2012-04-07: 1000 mL

## 2012-04-07 MED ORDER — FENTANYL CITRATE 0.05 MG/ML IJ SOLN
INTRAMUSCULAR | Status: DC | PRN
  Administered 2012-04-07 (×2): 100 ug via INTRAVENOUS
  Administered 2012-04-07: 50 ug via INTRAVENOUS

## 2012-04-07 MED ORDER — HETASTARCH-ELECTROLYTES 6 % IV SOLN
INTRAVENOUS | Status: DC | PRN
  Administered 2012-04-07: 15:00:00 via INTRAVENOUS

## 2012-04-07 MED ORDER — VECURONIUM BROMIDE 10 MG IV SOLR
INTRAVENOUS | Status: DC | PRN
  Administered 2012-04-07 (×3): 5 mg via INTRAVENOUS
  Administered 2012-04-07: 2 mg via INTRAVENOUS
  Administered 2012-04-07: 4 mg via INTRAVENOUS
  Administered 2012-04-07: 5 mg via INTRAVENOUS
  Administered 2012-04-07: 2 mg via INTRAVENOUS

## 2012-04-07 MED ORDER — LABETALOL HCL 5 MG/ML IV SOLN
20.0000 mg | Freq: Once | INTRAVENOUS | Status: AC
  Administered 2012-04-07: 20 mg via INTRAVENOUS
  Filled 2012-04-07: qty 4

## 2012-04-07 MED ORDER — CEFAZOLIN SODIUM-DEXTROSE 2-3 GM-% IV SOLR
2.0000 g | Freq: Three times a day (TID) | INTRAVENOUS | Status: AC
  Administered 2012-04-07 – 2012-04-08 (×3): 2 g via INTRAVENOUS
  Filled 2012-04-07 (×3): qty 50

## 2012-04-07 MED ORDER — CEFAZOLIN SODIUM 1-5 GM-% IV SOLN
INTRAVENOUS | Status: AC
  Filled 2012-04-07: qty 150

## 2012-04-07 SURGICAL SUPPLY — 142 items
4.3X365MM CALIBRATED DRILL BIT ×1 IMPLANT
500ML WOUND VAC CANISTER ×1 IMPLANT
5x48mm screw (Screw) ×1 IMPLANT
BANDAGE ELASTIC 4 VELCRO ST LF (GAUZE/BANDAGES/DRESSINGS) ×5 IMPLANT
BANDAGE ELASTIC 6 VELCRO ST LF (GAUZE/BANDAGES/DRESSINGS) ×5 IMPLANT
BANDAGE GAUZE ELAST BULKY 4 IN (GAUZE/BANDAGES/DRESSINGS) ×10 IMPLANT
BIT DRILL 2.5X2.75 QC CALB (BIT) ×1 IMPLANT
BIT DRILL 3.8X6 NS (BIT) ×1 IMPLANT
BIT DRILL 4.4 NS (BIT) ×1 IMPLANT
BIT DRILL AO MATTA 2.5MX230M (BIT) IMPLANT
BIT DRILL CROWE PNT TWST 4.5MM (DRILL) IMPLANT
BLADE SURG 10 STRL SS (BLADE) ×8 IMPLANT
BLADE SURG ROTATE 9660 (MISCELLANEOUS) ×5 IMPLANT
BNDG COHESIVE 4X5 TAN STRL (GAUZE/BANDAGES/DRESSINGS) ×5 IMPLANT
BNDG COHESIVE 6X5 TAN STRL LF (GAUZE/BANDAGES/DRESSINGS) IMPLANT
BRUSH SCRUB DISP (MISCELLANEOUS) ×14 IMPLANT
CLOTH BEACON ORANGE TIMEOUT ST (SAFETY) ×5 IMPLANT
COVER SURGICAL LIGHT HANDLE (MISCELLANEOUS) ×10 IMPLANT
CUFF TOURNIQUET SINGLE 34IN LL (TOURNIQUET CUFF) IMPLANT
CUFF TOURNIQUET SINGLE 44IN (TOURNIQUET CUFF) IMPLANT
DECANTER SPIKE VIAL GLASS SM (MISCELLANEOUS) IMPLANT
DRAIN CHANNEL 15F RND FF W/TCR (WOUND CARE) ×4 IMPLANT
DRAPE C-ARM 42X72 X-RAY (DRAPES) ×6 IMPLANT
DRAPE C-ARMOR (DRAPES) ×10 IMPLANT
DRAPE INCISE IOBAN 66X45 STRL (DRAPES) ×10 IMPLANT
DRAPE ORTHO SPLIT 77X108 STRL (DRAPES) ×20
DRAPE PROXIMA HALF (DRAPES) IMPLANT
DRAPE STERI IOBAN 125X83 (DRAPES) ×5 IMPLANT
DRAPE SURG ORHT 6 SPLT 77X108 (DRAPES) ×8 IMPLANT
DRAPE U-SHAPE 47X51 STRL (DRAPES) ×10 IMPLANT
DRILL BIT AO MATTA 2.5MX230M (BIT) ×5
DRILL CROWE POINT TWIST 4.5MM (DRILL) ×5
DRSG ADAPTIC 3X8 NADH LF (GAUZE/BANDAGES/DRESSINGS) ×5 IMPLANT
DRSG EMULSION OIL 3X3 NADH (GAUZE/BANDAGES/DRESSINGS) ×5 IMPLANT
DRSG MEPILEX BORDER 4X4 (GAUZE/BANDAGES/DRESSINGS) ×9 IMPLANT
DRSG MEPILEX BORDER 4X8 (GAUZE/BANDAGES/DRESSINGS) ×5 IMPLANT
DRSG MEPITEL 4X7.2 (GAUZE/BANDAGES/DRESSINGS) ×1 IMPLANT
DRSG PAD ABDOMINAL 8X10 ST (GAUZE/BANDAGES/DRESSINGS) ×10 IMPLANT
DRSG VAC ATS SM SENSATRAC (GAUZE/BANDAGES/DRESSINGS) ×1 IMPLANT
ELECT BLADE 6.5 EXT (BLADE) ×1 IMPLANT
ELECT CAUTERY BLADE 6.4 (BLADE) ×2 IMPLANT
ELECT REM PT RETURN 9FT ADLT (ELECTROSURGICAL) ×5
ELECTRODE REM PT RTRN 9FT ADLT (ELECTROSURGICAL) ×4 IMPLANT
EVACUATOR 1/8 PVC DRAIN (DRAIN) IMPLANT
EVACUATOR SILICONE 100CC (DRAIN) ×4 IMPLANT
GLOVE BIO SURGEON STRL SZ 6.5 (GLOVE) ×1 IMPLANT
GLOVE BIO SURGEON STRL SZ7 (GLOVE) ×1 IMPLANT
GLOVE BIO SURGEON STRL SZ7.5 (GLOVE) ×7 IMPLANT
GLOVE BIO SURGEON STRL SZ8 (GLOVE) ×7 IMPLANT
GLOVE BIO SURGEON STRL SZ8.5 (GLOVE) ×5 IMPLANT
GLOVE BIOGEL PI IND STRL 7.0 (GLOVE) IMPLANT
GLOVE BIOGEL PI IND STRL 7.5 (GLOVE) ×4 IMPLANT
GLOVE BIOGEL PI IND STRL 8 (GLOVE) ×4 IMPLANT
GLOVE BIOGEL PI INDICATOR 7.0 (GLOVE) ×2
GLOVE BIOGEL PI INDICATOR 7.5 (GLOVE) ×2
GLOVE BIOGEL PI INDICATOR 8 (GLOVE) ×2
GOWN PREVENTION PLUS XLARGE (GOWN DISPOSABLE) ×6 IMPLANT
GOWN PREVENTION PLUS XXLARGE (GOWN DISPOSABLE) ×10 IMPLANT
GOWN STRL NON-REIN LRG LVL3 (GOWN DISPOSABLE) ×12 IMPLANT
GUIDEPIN 2.8 X 300 THRD TIP OIC ×2 IMPLANT
GUIDEPIN 3.2X17.5 THRD DISP (PIN) ×1 IMPLANT
GUIDEWIRE BALL NOSE 80CM (WIRE) ×1 IMPLANT
GUIDEWIRE BEAD TIP (WIRE) ×1 IMPLANT
HANDPIECE INTERPULSE COAX TIP (DISPOSABLE) ×5
KIT BASIN OR (CUSTOM PROCEDURE TRAY) ×5 IMPLANT
KIT INFUSE LRG II (Orthopedic Implant) ×1 IMPLANT
KIT ROOM TURNOVER OR (KITS) ×5 IMPLANT
MANIFOLD NEPTUNE II (INSTRUMENTS) ×5 IMPLANT
NAIL FEM RETRO 12X420 (Nail) ×1 IMPLANT
NAIL TIBIAL 10X39M (Nail) ×1 IMPLANT
NEEDLE 22X1 1/2 (OR ONLY) (NEEDLE) IMPLANT
NS IRRIG 1000ML POUR BTL (IV SOLUTION) ×10 IMPLANT
PACK GENERAL/GYN (CUSTOM PROCEDURE TRAY) ×5 IMPLANT
PACK ORTHO EXTREMITY (CUSTOM PROCEDURE TRAY) ×4 IMPLANT
PACK TOTAL JOINT (CUSTOM PROCEDURE TRAY) ×5 IMPLANT
PAD ARMBOARD 7.5X6 YLW CONV (MISCELLANEOUS) ×10 IMPLANT
PAD CAST 4YDX4 CTTN HI CHSV (CAST SUPPLIES) ×8 IMPLANT
PADDING CAST COTTON 4X4 STRL (CAST SUPPLIES) ×10
PASSER SUT SWANSON 36MM LOOP (INSTRUMENTS) IMPLANT
PLATE ACE 3.5MM 4HOLE (Plate) ×1 IMPLANT
PLATE SYMPHYSIS 92.5M 6H (Plate) ×1 IMPLANT
SCREW ACECAP 36MM (Screw) ×2 IMPLANT
SCREW ACECAP 38MM (Screw) ×1 IMPLANT
SCREW ACECAP 40MM (Screw) ×1 IMPLANT
SCREW ACECAP 44MM (Screw) ×1 IMPLANT
SCREW CANN 7.3X100 32MM THRD (Screw) ×1 IMPLANT
SCREW CANN 7.3X65 32MM THRD (Screw) ×1 IMPLANT
SCREW CANN 7.3X95 32MM THRD (Screw) ×1 IMPLANT
SCREW CORT TI DBL LEAD 5X40 (Screw) ×2 IMPLANT
SCREW CORT TI DBL LEAD 5X60 (Screw) ×1 IMPLANT
SCREW CORT TI DBL LEAD 5X65 (Screw) ×1 IMPLANT
SCREW CORT TI DBLE LEAD 5X54 (Screw) ×1 IMPLANT
SCREW CORTEX ST MATTA 3.5X24 (Screw) ×1 IMPLANT
SCREW CORTEX ST MATTA 3.5X30MM (Screw) ×1 IMPLANT
SCREW CORTEX ST MATTA 3.5X34MM (Screw) ×2 IMPLANT
SCREW CORTEX ST MATTA 3.5X45MM (Screw) ×1 IMPLANT
SCREW CORTEX ST MATTA 3.5X50MM (Screw) ×1 IMPLANT
SCREW CORTICAL 3.5MM  10MM (Screw) ×1 IMPLANT
SCREW CORTICAL 3.5MM  12MM (Screw) ×1 IMPLANT
SCREW CORTICAL 3.5MM 10MM (Screw) IMPLANT
SCREW CORTICAL 3.5MM 12MM (Screw) IMPLANT
SCREW CORTICAL 3.5MM 24MM (Screw) ×1 IMPLANT
SCREW PROXIMAL DEPUY (Screw) ×10 IMPLANT
SCREW PRXML FT 60X5.5XNS LF (Screw) IMPLANT
SCREW PRXML FT 65X5.5XNS CORT (Screw) IMPLANT
SET HNDPC FAN SPRY TIP SCT (DISPOSABLE) IMPLANT
SPONGE GAUZE 4X4 12PLY (GAUZE/BANDAGES/DRESSINGS) ×10 IMPLANT
SPONGE LAP 18X18 X RAY DECT (DISPOSABLE) ×2 IMPLANT
SPONGE SCRUB IODOPHOR (GAUZE/BANDAGES/DRESSINGS) ×5 IMPLANT
STAPLER VISISTAT 35W (STAPLE) ×6 IMPLANT
STOCKINETTE IMPERVIOUS LG (DRAPES) ×2 IMPLANT
STRIP CLOSURE SKIN 1/2X4 (GAUZE/BANDAGES/DRESSINGS) ×5 IMPLANT
SUCTION FRAZIER TIP 10 FR DISP (SUCTIONS) ×5 IMPLANT
SUT ETHILON 1 TP 1 60 (SUTURE) ×1 IMPLANT
SUT ETHILON 2 0 FS 18 (SUTURE) ×5 IMPLANT
SUT ETHILON 3 0 PS 1 (SUTURE) ×9 IMPLANT
SUT FIBERWIRE #2 38 T-5 BLUE (SUTURE) ×5
SUT FIBERWIRE #5 38 CONV NDL (SUTURE) ×5
SUT PDS AB 0 CT 36 (SUTURE) ×2 IMPLANT
SUT PDS AB 2-0 CT1 27 (SUTURE) ×2 IMPLANT
SUT PROLENE 3 0 PS 2 (SUTURE) IMPLANT
SUT STEEL 5 V 56 M (SUTURE) ×4 IMPLANT
SUT VIC AB 0 CT1 27 (SUTURE) ×5
SUT VIC AB 0 CT1 27XBRD ANBCTR (SUTURE) ×16 IMPLANT
SUT VIC AB 1 CT1 18XCR BRD 8 (SUTURE) ×8 IMPLANT
SUT VIC AB 1 CT1 27 (SUTURE)
SUT VIC AB 1 CT1 27XBRD ANBCTR (SUTURE) ×4 IMPLANT
SUT VIC AB 1 CT1 8-18 (SUTURE) ×5
SUT VIC AB 2-0 CT1 27 (SUTURE) ×5
SUT VIC AB 2-0 CT1 TAPERPNT 27 (SUTURE) ×16 IMPLANT
SUT VIC AB 2-0 CT3 27 (SUTURE) IMPLANT
SUTURE FIBERWR #2 38 T-5 BLUE (SUTURE) IMPLANT
SUTURE FIBERWR #5 38 CONV NDL (SUTURE) IMPLANT
SYR CONTROL 10ML LL (SYRINGE) IMPLANT
TOWEL OR 17X24 6PK STRL BLUE (TOWEL DISPOSABLE) ×5 IMPLANT
TOWEL OR 17X26 10 PK STRL BLUE (TOWEL DISPOSABLE) ×10 IMPLANT
TRAY FOLEY CATH 14FR (SET/KITS/TRAYS/PACK) ×4 IMPLANT
TUBE CONNECTING 12X1/4 (SUCTIONS) ×4 IMPLANT
UNDERPAD 30X30 INCONTINENT (UNDERPADS AND DIAPERS) ×6 IMPLANT
WASHER OIC 13MM 6 PACK (Screw) ×2 IMPLANT
WATER STERILE IRR 1000ML POUR (IV SOLUTION) ×20 IMPLANT
YANKAUER SUCT BULB TIP NO VENT (SUCTIONS) ×4 IMPLANT

## 2012-04-07 NOTE — Progress Notes (Signed)
UR complete 

## 2012-04-07 NOTE — Progress Notes (Signed)
Pt going to OR this morning, all proper suctioning done prior to going.
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
## 2012-04-07 NOTE — Progress Notes (Signed)
Patient has remained in the OR all day.

## 2012-04-07 NOTE — Transfer of Care (Signed)
Immediate Anesthesia Transfer of Care Note  Patient: Miguel Brady  Procedure(s) Performed: Procedure(s) (LRB): INTRAMEDULLARY (IM) NAIL TIBIAL (Left) INTRAMEDULLARY (IM) NAIL FEMORAL (Left) OPEN REDUCTION INTERNAL (ORIF) FIXATION PATELLA (Left) OPEN REDUCTION INTERNAL FIXATION (ORIF) PELVIC FRACTURE (N/A) FLEXIBLE BRONCHOSCOPY ()  Patient Location: SICU  Anesthesia Type: General  Level of Consciousness: sedated and unresponsive  Airway & Oxygen Therapy: Patient remains intubated per anesthesia plan  Post-op Assessment: Report given to PACU RN and Post -op Vital signs reviewed and stable  Post vital signs: Reviewed and stable  Complications: No apparent anesthesia complications

## 2012-04-07 NOTE — Anesthesia Postprocedure Evaluation (Signed)
  Anesthesia Post-op Note  Patient: Miguel Brady  Procedure(s) Performed: Procedure(s) (LRB): INTRAMEDULLARY (IM) NAIL TIBIAL (Left) INTRAMEDULLARY (IM) NAIL FEMORAL (Left) OPEN REDUCTION INTERNAL (ORIF) FIXATION PATELLA (Left) OPEN REDUCTION INTERNAL FIXATION (ORIF) PELVIC FRACTURE (N/A) FLEXIBLE BRONCHOSCOPY ()  Patient Location: SICU  Anesthesia Type: General  Level of Consciousness: Patient remains intubated per anesthesia plan  Airway and Oxygen Therapy: Patient remains intubated per anesthesia plan  Post-op Pain: mild  Post-op Assessment: Post-op Vital signs reviewed  Post-op Vital Signs: Reviewed  Complications: No apparent anesthesia complications

## 2012-04-07 NOTE — Anesthesia Preprocedure Evaluation (Addendum)
Anesthesia Evaluation  Patient identified by MRN, date of birth, ID band Patient unresponsive    Reviewed: Unable to perform ROS - Chart review only  Airway       Dental   Pulmonary          Cardiovascular     Neuro/Psych    GI/Hepatic   Endo/Other    Renal/GU      Musculoskeletal   Abdominal   Peds  Hematology   Anesthesia Other Findings   Reproductive/Obstetrics                          Anesthesia Physical Anesthesia Plan  ASA: IV  Anesthesia Plan: General   Post-op Pain Management:    Induction:   Airway Management Planned: Oral ETT  Additional Equipment: Arterial line  Intra-op Plan:   Post-operative Plan: Post-operative intubation/ventilation  Informed Consent:   Plan Discussed with:   Anesthesia Plan Comments:         Anesthesia Quick Evaluation

## 2012-04-07 NOTE — Preoperative (Signed)
Beta Blockers   Reason not to administer Beta Blockers:Not Applicable 

## 2012-04-07 NOTE — Brief Op Note (Signed)
04/02/2012 - 04/07/2012  5:40 PM  PATIENT:  Miguel Brady  51 y.o. male  PRE-OPERATIVE DIAGNOSIS:  OPEN LEFT TIBIA FRACTURE, OPEN LEFT FEMORAL SHAFT FRACTURE, LEFT PATELLA FRACTURE, PELVIC RING FRACTURE  POST-OPERATIVE DIAGNOSIS:  open left tibia/femur fractures, left patella fracture, pelvis fracture  PROCEDURE:  Procedure(s) (LRB): INTRAMEDULLARY (IM) NAIL TIBIAL (Left) w [employer] Versanail 10 x statically locked INTRAMEDULLARY (IM) NAIL FEMORAL (Left) retrograde w [employer] [place] 12 x OPEN REDUCTION INTERNAL (ORIF) FIXATION PATELLA (Left) OPEN REDUCTION INTERNAL FIXATION (ORIF) PELVIC FRACTURE (N/A) Removal external fixator I&D open tibia I&D open femur FLEXIBLE BRONCHOSCOPY ()  SURGEON:  Surgeon(s) and Role: Panel 1:    * Budd Palmer, MD - Primary  Panel 2:    * Liz Malady, MD - Primary  PHYSICIAN ASSISTANT: Montez Morita, [facility]  ASSISTANTS: student  ANESTHESIA:   general  EBL:  Total I/O In: 2280 [I.V.:2280] Out: 435 [Urine:310; Blood:125]  BLOOD ADMINISTERED:none  DRAINS: none   LOCAL MEDICATIONS USED:  NONE  SPECIMEN:  No Specimen  DISPOSITION OF SPECIMEN:  N/A  COUNTS:  YES  TOURNIQUET:  * No tourniquets in log *  DICTATION: .Other Dictation: Dictation Number [id number]  PLAN OF CARE: Admit to inpatient   PATIENT DISPOSITION: ICU, hemodynamically stable   Delay start of Pharmacological VTE agent (>24hrs) due to surgical blood loss or risk of bleeding: no

## 2012-04-08 ENCOUNTER — Encounter (HOSPITAL_COMMUNITY): Payer: Self-pay | Admitting: Orthopedic Surgery

## 2012-04-08 ENCOUNTER — Inpatient Hospital Stay (HOSPITAL_COMMUNITY): Payer: No Typology Code available for payment source

## 2012-04-08 LAB — POCT I-STAT 7, (LYTES, BLD GAS, ICA,H+H)
Acid-Base Excess: 2 mmol/L (ref 0.0–2.0)
Bicarbonate: 27.4 meq/L — ABNORMAL HIGH (ref 20.0–24.0)
Calcium, Ion: 1.07 mmol/L — ABNORMAL LOW (ref 1.12–1.23)
HCT: 25 % — ABNORMAL LOW (ref 39.0–52.0)
Hemoglobin: 8.5 g/dL — ABNORMAL LOW (ref 13.0–17.0)
Sodium: 147 meq/L — ABNORMAL HIGH (ref 135–145)
pH, Arterial: 7.417 (ref 7.350–7.450)
pO2, Arterial: 197 mmHg — ABNORMAL HIGH (ref 80.0–100.0)

## 2012-04-08 LAB — BASIC METABOLIC PANEL
CO2: 25 mEq/L (ref 19–32)
Chloride: 111 mEq/L (ref 96–112)
Creatinine, Ser: 1.06 mg/dL (ref 0.50–1.35)
Potassium: 3.8 mEq/L (ref 3.5–5.1)
Sodium: 145 mEq/L (ref 135–145)

## 2012-04-08 LAB — POCT I-STAT 3, ART BLOOD GAS (G3+)
Acid-Base Excess: 2 mmol/L (ref 0.0–2.0)
Bicarbonate: 27 meq/L — ABNORMAL HIGH (ref 20.0–24.0)
Bicarbonate: 27.9 meq/L — ABNORMAL HIGH (ref 20.0–24.0)
TCO2: 28 mmol/L (ref 0–100)
pCO2 arterial: 50.9 mmHg — ABNORMAL HIGH (ref 35.0–45.0)
pH, Arterial: 7.334 — ABNORMAL LOW (ref 7.350–7.450)
pH, Arterial: 7.372 (ref 7.350–7.450)
pO2, Arterial: 102 mmHg — ABNORMAL HIGH (ref 80.0–100.0)

## 2012-04-08 LAB — GLUCOSE, CAPILLARY: Glucose-Capillary: 141 mg/dL — ABNORMAL HIGH (ref 70–99)

## 2012-04-08 LAB — POCT I-STAT 4, (NA,K, GLUC, HGB,HCT)
HCT: 29 % — ABNORMAL LOW (ref 39.0–52.0)
Hemoglobin: 9.9 g/dL — ABNORMAL LOW (ref 13.0–17.0)
Sodium: 146 meq/L — ABNORMAL HIGH (ref 135–145)

## 2012-04-08 LAB — CBC
MCV: 83.8 fL (ref 78.0–100.0)
Platelets: 93 10*3/uL — ABNORMAL LOW (ref 150–400)
RBC: 3.2 MIL/uL — ABNORMAL LOW (ref 4.22–5.81)
WBC: 11.1 10*3/uL — ABNORMAL HIGH (ref 4.0–10.5)

## 2012-04-08 LAB — POCT I-STAT GLUCOSE: Glucose, Bld: 112 mg/dL — ABNORMAL HIGH (ref 70–99)

## 2012-04-08 MED ORDER — PIVOT 1.5 CAL PO LIQD
1000.0000 mL | ORAL | Status: DC
  Administered 2012-04-10 – 2012-04-19 (×8): 1000 mL
  Filled 2012-04-08 (×16): qty 1000

## 2012-04-08 MED ORDER — INSULIN GLARGINE 100 UNIT/ML ~~LOC~~ SOLN
5.0000 [IU] | Freq: Every day | SUBCUTANEOUS | Status: DC
  Administered 2012-04-08 – 2012-04-15 (×8): 5 [IU] via SUBCUTANEOUS

## 2012-04-08 MED ORDER — INSULIN ASPART 100 UNIT/ML ~~LOC~~ SOLN
0.0000 [IU] | SUBCUTANEOUS | Status: DC
  Administered 2012-04-08 – 2012-04-15 (×29): 1 [IU] via SUBCUTANEOUS
  Administered 2012-04-15: 2 [IU] via SUBCUTANEOUS
  Administered 2012-04-15 (×3): 1 [IU] via SUBCUTANEOUS
  Administered 2012-04-15 – 2012-04-16 (×3): 2 [IU] via SUBCUTANEOUS
  Administered 2012-04-16 – 2012-04-17 (×6): 1 [IU] via SUBCUTANEOUS
  Administered 2012-04-17: 2 [IU] via SUBCUTANEOUS
  Administered 2012-04-17: 1 [IU] via SUBCUTANEOUS
  Administered 2012-04-17: 2 [IU] via SUBCUTANEOUS
  Administered 2012-04-18 (×2): 1 [IU] via SUBCUTANEOUS
  Administered 2012-04-18: 2 [IU] via SUBCUTANEOUS
  Administered 2012-04-18 – 2012-04-19 (×5): 1 [IU] via SUBCUTANEOUS
  Administered 2012-04-19 – 2012-04-20 (×2): via SUBCUTANEOUS
  Administered 2012-04-20: 1 [IU] via SUBCUTANEOUS
  Administered 2012-04-21: 2 [IU] via SUBCUTANEOUS
  Administered 2012-04-21: 1 [IU] via SUBCUTANEOUS

## 2012-04-08 MED ORDER — SODIUM CHLORIDE 0.9 % IJ SOLN
10.0000 mL | Freq: Two times a day (BID) | INTRAMUSCULAR | Status: DC
  Administered 2012-04-10 – 2012-04-12 (×3): 10 mL
  Administered 2012-04-13: 50 mL
  Administered 2012-04-15: 30 mL
  Administered 2012-04-16 – 2012-04-19 (×6): 10 mL
  Administered 2012-04-20: 20 mL
  Administered 2012-04-21 – 2012-04-22 (×3): 10 mL

## 2012-04-08 MED ORDER — PRO-STAT SUGAR FREE PO LIQD
30.0000 mL | Freq: Three times a day (TID) | ORAL | Status: DC
  Administered 2012-04-08 – 2012-04-19 (×34): 30 mL
  Filled 2012-04-08 (×42): qty 30

## 2012-04-08 MED ORDER — SODIUM CHLORIDE 0.9 % IJ SOLN: 10.0000 mL | INTRAMUSCULAR | Status: DC | PRN

## 2012-04-08 MED ORDER — ADULT MULTIVITAMIN LIQUID CH
5.0000 mL | Freq: Every day | ORAL | Status: DC
  Administered 2012-04-08 – 2012-04-19 (×12): 5 mL
  Filled 2012-04-08 (×14): qty 5

## 2012-04-08 NOTE — Progress Notes (Addendum)
Nutrition Follow-up  Intervention:    Pivot 1.5 formula at goal rate of 40 ml/hr; add Prostat liquid protein 30 ml 3 times daily via tube to provide 1740 total kcals (67% of estimated kcals), 135 gm protein (90% of estimated protein needs), 729 ml of free water  Liquid MVI daily via tube RD to follow for nutrition care plan  Assessment:   Patient is currently intubated on ventilator support.  MV: 12.1 Temp: 37.4 C  Pivot 1.5 formula infusing at 20 ml/hr via OGT, orders per MD to advance to goal rate of 40 ml/hr. S/p several procedures 7/23:  INTRAMEDULLARY (IM) NAIL TIBIAL (Left)  INTRAMEDULLARY (IM) NAIL FEMORAL (Left)  OPEN REDUCTION INTERNAL (ORIF) FIXATION PATELLA (Left)  OPEN REDUCTION INTERNAL FIXATION (ORIF) PELVIC FRACTURE  Removal external fixator  I&D open tibia  I&D open femur  FLEXIBLE BRONCHOSCOPY   Diet Order:  NPO  Meds: Scheduled Meds:   . albuterol  6 puff Inhalation Q6H  . antiseptic oral rinse  15 mL Mouth Rinse QID  .  ceFAZolin (ANCEF) IV  3 g Intravenous Once  .  ceFAZolin (ANCEF) IV  1 g Intravenous Q8H  .  ceFAZolin (ANCEF) IV  2 g Intravenous Q8H  . chlorhexidine  15 mL Mouth Rinse BID  . insulin aspart  0-9 Units Subcutaneous Q4H  . insulin glargine  5 Units Subcutaneous QHS  . labetalol  20 mg Intravenous Once  . labetalol  20 mg Intravenous Once  . mupirocin ointment  1 application Nasal BID  . pantoprazole  40 mg Oral Q1200   Or  . pantoprazole (PROTONIX) IV  40 mg Intravenous Q1200  . sodium chloride  10-40 mL Intracatheter Q12H   Continuous Infusions:   . dextrose 5 % and 0.45 % NaCl with KCl 10 mEq/L 75 mL/hr at 04/08/12 1000  . feeding supplement (PIVOT 1.5 CAL) 1,000 mL (04/08/12 0900)  . fentaNYL infusion INTRAVENOUS 200 mcg/hr (04/08/12 1013)  . midazolam (VERSED) infusion 5 mg/hr (04/08/12 1000)  . DISCONTD: feeding supplement (PIVOT 1.5 CAL) 1,000 mL (04/07/12 1946)   PRN Meds:.fentaNYL, midazolam, ondansetron (ZOFRAN) IV,  ondansetron, sodium chloride, DISCONTD: 0.9 % irrigation (POUR BTL), DISCONTD: sodium chloride irrigation  Labs:  CMP     Component Value Date/Time   NA 145 04/08/2012 0455   K 3.8 04/08/2012 0455   CL 111 04/08/2012 0455   CO2 25 04/08/2012 0455   GLUCOSE 126* 04/08/2012 0455   BUN 12 04/08/2012 0455   CREATININE 1.06 04/08/2012 0455   CALCIUM 7.0* 04/08/2012 0455   PROT 4.9* 04/04/2012 0400   ALBUMIN 2.1* 04/04/2012 0400   AST 118* 04/04/2012 0400   ALT 34 04/04/2012 0400   ALKPHOS 53 04/04/2012 0400   BILITOT 0.9 04/04/2012 0400   GFRNONAA 80* 04/08/2012 0455   GFRAA >90 04/08/2012 0455     Intake/Output Summary (Last 24 hours) at 04/08/12 1023 Last data filed at 04/08/12 1000  Gross per 24 hour  Intake 5042.76 ml  Output   2505 ml  Net 2537.76 ml    Weight Status:  143.3 kg (7/23) -- trending up  Re-estimated needs:  2600 kcals, 150-160 gm protein  Nutrition Dx: Inadequate Oral Intake, ongoing   Goal: EN to provide 60-70% of estimated calorie needs (22-25 kcals/kg ideal body weight) and >/= 90% of estimated protein needs, based on ASPEN guidelines for permissive underfeeding in critically ill obese individuals, progressing  Monitor: EN regimen & tolerance, respiratory status, weight, labs, I/O's   Miguel Brady  Miguel Brady, RD, LDN Pager #: [phone] After-Hours Pager #: [phone]
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
## 2012-04-08 NOTE — Progress Notes (Signed)
UR completed 

## 2012-04-08 NOTE — Progress Notes (Signed)
Orthopedic Tech Progress Note Patient Details:  Cuyler Vandyken 23-Apr-1961 [id number]  Patient ID: [name], male   DOB: 1961/03/18, 51 y.o.   MRN: [id number]   Shawnie Pons 04/08/2012, 9:38 AM CALLED BIO TECH FOR BILATERAL PRAFO BRACES
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
## 2012-04-08 NOTE — Progress Notes (Signed)
Brief Nursing Progress:  Evening shift 04/08/12;  Miguel Brady has rested fairly well tonight with intermittent periods of mild agitation (biting at ETT and moving about in bed "bucking" ventilator).  These periods of agitation are often precipitated by increased secretions in ETT.  Patient required suction more than 2 times per hour throughout the evening.  Will continue to sedate as per orders in order to prevent these outburst of biting the ETT and asynchrony with ventilator support.

## 2012-04-08 NOTE — Progress Notes (Signed)
Attempted right arm PICC without success.  Very swollen.  It was difficult to see veins due to multiple fluid pockets visible.  The cephalic vein was very small so did not attempt it.   Basilic and brachial veins difficult to see.  Primary RN made aware.   Still has a left subclavian CVC in place functioning without problems.

## 2012-04-08 NOTE — Progress Notes (Signed)
Called to patient's room by nursing staff, patient desatting with low tidal volumes.  Tube noted to be secured essentially in same position as prior, out slightly.  Cuff deflated and tube advanced 1 cm.  Tidal volumes returned to normal, sats returning to normal.

## 2012-04-08 NOTE — Progress Notes (Signed)
Follow up - Trauma and Critical Care  Patient Details:    Miguel Brady is an 51 y.o. male.  Lines/tubes : AIRWAYS 7.5 mm (Active)  Secured at (cm) 23 cm 04/02/2012 12:00 AM     Airway 7.5 mm (Active)  Secured at (cm) 21 cm 04/08/2012  3:30 AM  Measured From Lips 04/08/2012  3:30 AM  Secured Location Right 04/08/2012  3:30 AM  Secured By [employer] 04/08/2012  3:30 AM  Tube Holder Repositioned Yes 04/08/2012  3:30 AM  Cuff Pressure (cm H2O) 22 cm H2O 04/07/2012 11:33 PM  Site Condition Erythema 04/03/2012 12:34 AM     CVC Double Lumen 04/03/12 Left Subclavian (Active)  Site Assessment Clean;Intact;Dry 04/07/2012  8:00 PM  Proximal Lumen Status Infusing 04/07/2012  8:00 PM  Distal Lumen Status Infusing 04/07/2012  8:00 PM  Dressing Type Transparent;Occlusive 04/07/2012  8:00 PM  Dressing Status Clean;Dry;Intact 04/07/2012  8:00 PM     Arterial Line 04/03/12 Right Radial (Active)  Site Assessment Clean;Dry;Intact 04/07/2012  8:00 PM  Line Status Pulsatile blood flow 04/07/2012  8:00 PM  Art Line Waveform Whip 04/07/2012  8:00 PM  Art Line Interventions Connections checked and tightened 04/07/2012  8:00 AM  Color/Movement/Sensation Capillary refill less than 3 sec 04/07/2012  8:00 PM  Dressing Type Occlusive;Transparent 04/07/2012  8:00 PM  Dressing Status Clean;Dry;Intact 04/07/2012  8:00 PM     Chest Tube 1 Left Pleural (Active)  Suction -20 cm H2O 04/07/2012  8:00 PM  Chest Tube Air Leak None 04/07/2012  8:00 PM  Patency Intervention Tip/tilt 04/06/2012  8:00 PM  Drainage Description Serosanguineous 04/07/2012  8:00 PM  Dressing Status Dry;Intact;Clean 04/07/2012  8:00 PM  Dressing Intervention Dressing changed 04/08/2012  6:00 AM  Site Assessment Clean;Dry;Intact 04/07/2012  8:00 PM  Surrounding Skin Unable to view 04/07/2012  8:00 PM  Output (mL) 25 mL 04/08/2012  6:00 AM     Negative Pressure Wound Therapy Leg Left (Active)  Dressing Status Intact 04/07/2012  8:00 PM  Drainage  Amount Minimal 04/06/2012  7:00 AM  Drainage Description Serosanguineous 04/07/2012  8:00 PM  Output (mL) 100 mL 04/08/2012  7:00 AM     NG/OG Tube Orogastric 16 Fr. Center mouth (Active)  Placement Verification Auscultation 04/07/2012  8:00 PM  Site Assessment Clean;Dry;Intact 04/07/2012  8:00 PM  Status Irrigated;Suction-low intermittent 04/07/2012  8:00 PM  Drainage Appearance Bile 04/07/2012  8:00 PM  Gastric Residual 20 mL 04/08/2012  6:00 AM  Intake (mL) 20 mL 04/06/2012 11:00 PM  Output (mL) 150 mL 04/06/2012  6:26 AM     Urethral Catheter Non-latex;Temperature probe (Active)  Indication for Insertion or Continuance of Catheter Urinary output monitoring 04/07/2012  8:00 PM  Site Assessment Clean;Intact 04/07/2012  8:00 PM  Collection Container Standard drainage bag 04/07/2012  8:00 PM  Securement Method Leg strap 04/07/2012  8:00 PM  Urinary Catheter Interventions Unclamped 04/07/2012  8:00 PM  Output (mL) 80 mL 04/08/2012  6:00 AM    Microbiology/Sepsis markers: Results for orders placed during the hospital encounter of 04/02/12  MRSA PCR SCREENING     Status: Abnormal   Collection Time   04/03/12  5:45 AM      Component Value Range Status Comment   MRSA by PCR POSITIVE (*) NEGATIVE Final   CULTURE, BAL-QUANTITATIVE     Status: Normal (Preliminary result)   Collection Time   04/07/12  5:00 PM      Component Value Range Status Comment   Specimen Description  BRONCHIAL ALVEOLAR LAVAGE   Final    Special Requests NONE   Final    Gram Stain     Final    Value: NO WBC SEEN     NO SQUAMOUS EPITHELIAL CELLS SEEN     NO ORGANISMS SEEN   Colony Count PENDING   Incomplete    Culture PENDING   Incomplete    Report Status PENDING   Incomplete     Anti-infectives:  Anti-infectives     Start     Dose/Rate Route Frequency Ordered Stop   04/07/12 2200   ceFAZolin (ANCEF) IVPB 2 g/50 mL premix        2 g 100 mL/hr over 30 Minutes Intravenous 3 times per day 04/07/12 1818 04/08/12 2159    04/06/12 0830   ceFAZolin (ANCEF) 3 g in dextrose 5 % 50 mL IVPB        3 g 160 mL/hr over 30 Minutes Intravenous  Once 04/06/12 0809 04/07/12 1403   04/03/12 1400   ceFAZolin (ANCEF) IVPB 1 g/50 mL premix        1 g 100 mL/hr over 30 Minutes Intravenous 3 times per day 04/03/12 0930 04/07/12 1359   04/03/12 1200   gentamicin (GARAMYCIN) 700 mg in dextrose 5 % 100 mL IVPB        700 mg 117.5 mL/hr over 60 Minutes Intravenous Every 24 hours 04/03/12 1035 04/04/12 1340   04/03/12 0600   ceFAZolin (ANCEF) IVPB 1 g/50 mL premix  Status:  Discontinued        1 g 100 mL/hr over 30 Minutes Intravenous 3 times per day 04/03/12 0550 04/03/12 0930   04/03/12 0249   polymyxin B 500,000 Units, bacitracin 50,000 Units in sodium chloride irrigation 0.9 % 500 mL irrigation  Status:  Discontinued          As needed 04/03/12 0249 04/03/12 0528   04/02/12 2315   ceFAZolin (ANCEF) IVPB 1 g/50 mL premix        1 g 100 mL/hr over 30 Minutes Intravenous  Once 04/02/12 2304 04/03/12 0125          Best Practice/Protocols:  GI Prophylaxis: Proton Pump Inhibitor Has IVC filter Continous Sedation  Consults: Treatment Team:  Harvie Junior, MD Clydene Fake, MD Budd Palmer, MD    Events:  Subjective:    Overnight Issues: ETT had significant airleak this AM.  This was not reported overnight.  PACO2 was 51 this AM, may have been due to leak.  Peak airway pressures were higher after repositioning the ETT.  Objective:  Vital signs for last 24 hours: Temp:  [94.5 F (34.7 C)-99.5 F (37.5 C)] 99 F (37.2 C) (07/24 0700) Pulse Rate:  [81-117] 116  (07/24 0700) Resp:  [18-21] 20  (07/24 0700) BP: (123-214)/(65-113) 140/71 mmHg (07/24 0700) SpO2:  [93 %-100 %] 97 % (07/24 0700) Arterial Line BP: (142-256)/(70-127) 151/77 mmHg (07/24 0700) FiO2 (%):  [40.7 %-50.4 %] 49.6 % (07/24 0700)  Hemodynamic parameters for last 24 hours: CVP:  [24 mmHg-29 mmHg] 24 mmHg  Intake/Output from  previous day: 07/23 0701 - 07/24 0700 In: 4947.8 [I.V.:4817.8; IV Piggyback:100] Out: 2430 [Urine:2015; Drains:100; Blood:200; Chest Tube:115]  Intake/Output this shift:    Vent settings for last 24 hours: Vent Mode:  [-] PRVC FiO2 (%):  [40.7 %-50.4 %] 49.6 % Set Rate:  [20 bmp-22 bmp] 20 bmp Vt Set:  [600 mL] 600 mL PEEP:  [8 cmH20] 8 cmH20 Plateau  Pressure:  [30 cmH20-34 cmH20] 30 cmH20  Physical Exam:  General: no respiratory distress and not responding, no distress Neuro: nonfocal exam, RASS -3 or deeper and No agitation. Resp: diminished breath sounds bilaterally, rhonchi bilaterally and wheezes bilaterally CVS: Tachycardic GI: distended, hypoactive BS and but tolerating tube feedings well.  Results for orders placed during the hospital encounter of 04/02/12 (from the past 24 hour(s))  CULTURE, BAL-QUANTITATIVE     Status: Normal (Preliminary result)   Collection Time   04/07/12  5:00 PM      Component Value Range   Specimen Description BRONCHIAL ALVEOLAR LAVAGE     Special Requests NONE     Gram Stain       Value: NO WBC SEEN     NO SQUAMOUS EPITHELIAL CELLS SEEN     NO ORGANISMS SEEN   Colony Count PENDING     Culture PENDING     Report Status PENDING    POCT I-STAT 3, BLOOD GAS (G3+)     Status: Abnormal   Collection Time   04/07/12  7:56 PM      Component Value Range   pH, Arterial 7.424  7.350 - 7.450   pCO2 arterial 37.3  35.0 - 45.0 mmHg   pO2, Arterial 57.0 (*) 80.0 - 100.0 mmHg   Bicarbonate 24.8 (*) 20.0 - 24.0 mEq/L   TCO2 26  0 - 100 mmol/L   O2 Saturation 92.0     Patient temperature 35.4 C     Sample type ARTERIAL    CBC     Status: Abnormal   Collection Time   04/07/12  8:10 PM      Component Value Range   WBC 10.4  4.0 - 10.5 K/uL   RBC 3.18 (*) 4.22 - 5.81 MIL/uL   Hemoglobin 9.0 (*) 13.0 - 17.0 g/dL   HCT 96.0 (*) 45.4 - 09.8 %   MCV 83.0  78.0 - 100.0 fL   MCH 28.3  26.0 - 34.0 pg   MCHC 34.1  30.0 - 36.0 g/dL   RDW 11.9 (*) 14.7 - 82.9  %   Platelets 84 (*) 150 - 400 K/uL  POCT I-STAT 3, BLOOD GAS (G3+)     Status: Abnormal   Collection Time   04/08/12  4:25 AM      Component Value Range   pH, Arterial 7.334 (*) 7.350 - 7.450   pCO2 arterial 50.9 (*) 35.0 - 45.0 mmHg   pO2, Arterial 82.0  80.0 - 100.0 mmHg   Bicarbonate 27.0 (*) 20.0 - 24.0 mEq/L   TCO2 28  0 - 100 mmol/L   O2 Saturation 95.0     Acid-Base Excess 1.0  0.0 - 2.0 mmol/L   Patient temperature 37.4 C     Sample type ARTERIAL    CBC     Status: Abnormal   Collection Time   04/08/12  4:55 AM      Component Value Range   WBC 11.1 (*) 4.0 - 10.5 K/uL   RBC 3.20 (*) 4.22 - 5.81 MIL/uL   Hemoglobin 8.9 (*) 13.0 - 17.0 g/dL   HCT 56.2 (*) 13.0 - 86.5 %   MCV 83.8  78.0 - 100.0 fL   MCH 27.8  26.0 - 34.0 pg   MCHC 33.2  30.0 - 36.0 g/dL   RDW 78.4 (*) 69.6 - 29.5 %   Platelets 93 (*) 150 - 400 K/uL  BASIC METABOLIC PANEL     Status: Abnormal  Collection Time   04/08/12  4:55 AM      Component Value Range   Sodium 145  135 - 145 mEq/L   Potassium 3.8  3.5 - 5.1 mEq/L   Chloride 111  96 - 112 mEq/L   CO2 25  19 - 32 mEq/L   Glucose, Bld 126 (*) 70 - 99 mg/dL   BUN 12  6 - 23 mg/dL   Creatinine, Ser 1.91  0.50 - 1.35 mg/dL   Calcium 7.0 (*) 8.4 - 10.5 mg/dL   GFR calc non Af Amer 80 (*) >90 mL/min   GFR calc Af Amer >90  >90 mL/min     Assessment/Plan:   NEURO  Altered Mental Status:  sedation and on continuous sedation, not able to tolerate ventilator without it   Plan: CPM  PULM  Atelectasis/collapse (focal and RUL) Lung Trauma (right and with contusion of lung)   Plan: Will consider repeat CT chest with possible thoracic surgical consultation.  No airleak, will put on waterseal.  CARDIO  Sinus Tachycardia   Plan: Physiologic from fevers and pain.  RENAL  No currentl issues.  Output is okay, and BUN/creatinine   Plan: No change in management  GI  No problem currently and starting tube feedings   Plan: CM  ID  No proven infectious  source   Plan: CPM.  Prophylactic antibiotics only.  HEME  Anemia acute blood loss anemia and anemia of critical illness)   Plan: CPM. monitor  ENDO Hyperglycemia (stress related and restarted tube feedings.)   Plan: Insulin sliding scale  Global Issues  This patient has multiple issues including respiratory failure requiring ventilator dependence.  His ETT was not properly positioned initially, and he desaturated easily when he was disconnected for repositioning.  He is on trickle tube feedings which we will increase.    LOS: 6 days   Additional comments:I reviewed the patient's new clinical lab test results. cbc/bmet and I reviewed the patients new imaging test results. CXR  Critical Care Total Time*: 45 Minutes  Alyson Ki O 04/08/2012  *Care during the described time interval was provided by me and/or other providers on the critical care team.  I have reviewed this patient's available data, including medical history, events of note, physical examination and test results as part of my evaluation.

## 2012-04-08 NOTE — Progress Notes (Signed)
Throughout the morning Miguel Brady has been restless and had periods of desaturation with copious oral, nasal and OGT secretions.

## 2012-04-08 NOTE — Progress Notes (Signed)
Peripherally Inserted Central Catheter/Midline Placement  The IV Nurse has discussed with the patient and/or persons authorized to consent for the patient, the purpose of this procedure and the potential benefits and risks involved with this procedure.  The benefits include less needle sticks, lab draws from the catheter and patient may be discharged home with the catheter.  Risks include, but not limited to, infection, bleeding, blood clot (thrombus formation), and puncture of an artery; nerve damage and irregular heat beat.  Alternatives to this procedure were also discussed.  PICC/Midline Placement Documentation     This information was explained to a family member due to patient intubated.   Miguel Brady 04/08/2012, 9:24 AM
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
## 2012-04-08 NOTE — Clinical Social Work Note (Signed)
Clinical Social Worker spoke with patient mother in 2300 waiting area to offer continued support.  Patient remains intubated and sedated at this time.  Patient mother has a large support system of friends and family who are present here in the waiting area.  Patient mother is in good spirits and very hopeful for patient full recovery.    Clinical Social Worker will continue to follow up with patient and patient mother to offer support and facilitate patient discharge needs once medically stable.  [address] [place], [place] [phone]

## 2012-04-09 ENCOUNTER — Encounter (HOSPITAL_COMMUNITY): Payer: Self-pay | Admitting: Orthopedic Surgery

## 2012-04-09 ENCOUNTER — Inpatient Hospital Stay (HOSPITAL_COMMUNITY): Payer: No Typology Code available for payment source

## 2012-04-09 LAB — POCT I-STAT 3, ART BLOOD GAS (G3+)
O2 Saturation: 97 %
TCO2: 31 mmol/L (ref 0–100)
pCO2 arterial: 60.5 mmHg (ref 35.0–45.0)
pCO2 arterial: 44.4 mmHg (ref 35.0–45.0)
pO2, Arterial: 80 mmHg (ref 80.0–100.0)
pO2, Arterial: 94 mmHg (ref 80.0–100.0)

## 2012-04-09 LAB — CBC WITH DIFFERENTIAL/PLATELET
Basophils Absolute: 0 10*3/uL (ref 0.0–0.1)
Basophils Relative: 0 % (ref 0–1)
Eosinophils Absolute: 0.2 10*3/uL (ref 0.0–0.7)
Eosinophils Relative: 1 % (ref 0–5)
MCH: 28 pg (ref 26.0–34.0)
MCV: 85.7 fL (ref 78.0–100.0)
Neutrophils Relative %: 73 % (ref 43–77)
Platelets: 115 10*3/uL — ABNORMAL LOW (ref 150–400)
RDW: 18.9 % — ABNORMAL HIGH (ref 11.5–15.5)

## 2012-04-09 LAB — GLUCOSE, CAPILLARY
Glucose-Capillary: 119 mg/dL — ABNORMAL HIGH (ref 70–99)
Glucose-Capillary: 134 mg/dL — ABNORMAL HIGH (ref 70–99)
Glucose-Capillary: 135 mg/dL — ABNORMAL HIGH (ref 70–99)
Glucose-Capillary: 136 mg/dL — ABNORMAL HIGH (ref 70–99)

## 2012-04-09 LAB — BLOOD GAS, ARTERIAL
Bicarbonate: 28.2 mEq/L — ABNORMAL HIGH (ref 20.0–24.0)
FIO2: 0.5 %
O2 Saturation: 99.8 %
PEEP: 8 cmH2O
TCO2: 29.6 mmol/L (ref 0–100)
pO2, Arterial: 117 mmHg — ABNORMAL HIGH (ref 80.0–100.0)

## 2012-04-09 LAB — BASIC METABOLIC PANEL
Calcium: 7 mg/dL — ABNORMAL LOW (ref 8.4–10.5)
GFR calc Af Amer: >90 mL/min (ref >90)
GFR calc non Af Amer: 81 mL/min — ABNORMAL LOW (ref >90)
Potassium: 3.6 mEq/L (ref 3.5–5.1)
Sodium: 146 mEq/L — ABNORMAL HIGH (ref 135–145)

## 2012-04-09 MED ORDER — POTASSIUM CHLORIDE 10 MEQ/50ML IV SOLN
10.0000 meq | INTRAVENOUS | Status: AC
  Administered 2012-04-09 (×2): 10 meq via INTRAVENOUS
  Filled 2012-04-09: qty 100

## 2012-04-09 MED ORDER — POTASSIUM CHLORIDE 10 MEQ/100ML IV SOLN: 10.0000 meq | INTRAVENOUS | Status: DC

## 2012-04-09 MED ORDER — PIPERACILLIN-TAZOBACTAM 3.375 G IVPB
3.3750 g | Freq: Three times a day (TID) | INTRAVENOUS | Status: DC
  Administered 2012-04-09 – 2012-04-13 (×12): 3.375 g via INTRAVENOUS
  Filled 2012-04-09 (×14): qty 50

## 2012-04-09 MED ORDER — ACETAMINOPHEN 160 MG/5ML PO SOLN
650.0000 mg | Freq: Four times a day (QID) | ORAL | Status: DC | PRN
  Administered 2012-04-09 – 2012-04-10 (×3): 650 mg
  Filled 2012-04-09 (×4): qty 20.3

## 2012-04-09 MED ORDER — VANCOMYCIN HCL 1000 MG IV SOLR
2500.0000 mg | Freq: Once | INTRAVENOUS | Status: AC
  Administered 2012-04-09: 2500 mg via INTRAVENOUS
  Filled 2012-04-09: qty 2500

## 2012-04-09 MED ORDER — FUROSEMIDE 10 MG/ML IJ SOLN
20.0000 mg | Freq: Once | INTRAMUSCULAR | Status: AC
  Administered 2012-04-09: 20 mg via INTRAVENOUS
  Filled 2012-04-09: qty 2

## 2012-04-09 MED ORDER — VANCOMYCIN HCL 1000 MG IV SOLR
1500.0000 mg | Freq: Two times a day (BID) | INTRAVENOUS | Status: DC
  Administered 2012-04-10 – 2012-04-13 (×7): 1500 mg via INTRAVENOUS
  Filled 2012-04-09 (×8): qty 1500

## 2012-04-09 NOTE — Progress Notes (Signed)
Patient ID: Miguel Brady, male   DOB: August 23, 1961, 51 y.o.   MRN: [id number] I spoke to the patient's mother by phone and gave her an update on his condition and the plan of care.  Patient now has fever.  BAL 60K GNR.  Will check urine and blood cultures and start empiric Vanc/Zosyn.  F/U ABG P also. Violeta Gelinas, MD, MPH, FACS Pager: [phone]
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
## 2012-04-09 NOTE — Progress Notes (Signed)
Pt discussed in hospital LOS meeting this am.  

## 2012-04-09 NOTE — Progress Notes (Signed)
Per ABG, pt is being underventilated. RT increased Vt back to 8cc/kg. RT to recheck ABG in one hour. RT will continue to monitor.

## 2012-04-09 NOTE — Progress Notes (Signed)
Subjective: Patient sedated intubated  Objective: Vital signs in last 24 hours: Temp:  [99.7 F (37.6 C)-102.4 F (39.1 C)] 102.4 F (39.1 C) (07/25 1500) Pulse Rate:  [112-130] 127  (07/25 1547) Resp:  [20-24] 24  (07/25 1500) BP: (100-163)/(47-90) 107/57 mmHg (07/25 1547) SpO2:  [95 %-100 %] 97 % (07/25 1547) Arterial Line BP: (101-195)/(54-89) 188/81 mmHg (07/25 1500) FiO2 (%):  [39.8 %-50.3 %] 50 % (07/25 1547) Weight:  [147.2 kg (324 lb 8.3 oz)] 147.2 kg (324 lb 8.3 oz) (07/25 0600)  Intake/Output from previous day: 07/24 0701 - 07/25 0700 In: 3540 [I.V.:2500; NG/GT:990; IV Piggyback:50] Out: 2020 [Urine:1920; Drains:50; Chest Tube:50] Intake/Output this shift: Total I/O In: 884 [I.V.:404; NG/GT:380; IV Piggyback:100] Out: 1800 [Urine:1800]  PE - sedated, intubated -  Opens eyes to stim - pupils equal and reactive - no movement of ext.  Lab Results:  Basename 04/09/12 0500 04/08/12 0455  WBC 13.7* 11.1*  HGB 8.0* 8.9*  HCT 24.5* 26.8*  PLT 115* 93*   BMET  Basename 04/09/12 0500 04/08/12 0455  NA 146* 145  K 3.6 3.8  CL 111 111  CO2 29 25  GLUCOSE 146* 126*  BUN 15 12  CREATININE 1.05 1.06  CALCIUM 7.0* 7.0*    Studies/Results: Dg Femur Left  04/07/2012  *RADIOLOGY REPORT*  Clinical Data: Femur fracture.  LEFT FEMUR - 2 VIEW  Comparison: 04/03/2012.  Findings: Six intraoperative C-arm views submitted for review at surgery. 3 femoral screws transfix the base of the left femoral neck fracture.  Femoral rod placed across comminuted left femoral shaft fracture with proximal and distal fixation.  Fracture fragments better aligned although still slightly separated.  Removal of external fixation device  IMPRESSION: Open reduction and internal fixation of the left femoral shaft fracture.  Original Report Authenticated By: Fuller Canada, M.D.   Dg Tibia/fibula Left  04/07/2012  *RADIOLOGY REPORT*  Clinical Data: Tibial fracture.  LEFT TIBIA AND FIBULA - 2 VIEW   Comparison: 04/03/2012.  Findings: Eight intraoperative C-arm views submitted for review at surgery.  Tibial rod placed across tibial fracture with proximal and distal fixation screws.  Side plate and screws utilized at the fracture site.  Fracture fragments better aligned.  Mid and distal left fibular fracture noted with separation of fracture fragments.  IMPRESSION: Open reduction and internal fixation of left tibial fracture as noted above.  Original Report Authenticated By: Fuller Canada, M.D.   Dg Pelvis Comp Min 3v  04/07/2012  *RADIOLOGY REPORT*  Clinical Data: Trauma and status post ORIF of the pelvis and left lower extremity.  JUDET PELVIS - 3+ VIEW  Comparison: Multiple prior examinations including intraoperative imaging earlier today  Findings: Alignment at the level of the sacroiliac joints and pubic symphysis appear improved after ORIF.  Transversely oriented screws present across the upper sacroiliac joints and superior reconstruction plate noted across the pubic symphysis.  There remains some probable widening of the inferior sacroiliac joints bilaterally based on radiographic appearance.  The pubic symphysis shows normal alignment.  Alignment is improved at the level of a left femoral neck fracture.  IMPRESSION: After ORIF, alignment is improved at the level of symphysial diastasis.  Screws are present across the sacroiliac joints with some persistent widening of the inferior SI joints radiographically.  Original Report Authenticated By: Reola Calkins, M.D.   Dg Pelvis Comp Min 3v  04/07/2012  *RADIOLOGY REPORT*  Clinical Data: Pelvic separation.  JUDET PELVIS - 3+ VIEW  Comparison: 04/02/2012.  Findings:  Seven intraoperative C-arm views submitted for review at surgery.  Plate and screw transfix the superior pubic ramus and body which are now better aligned.  One of the screws may traverse the lateral border of the left pubic body.  Screws traverse the sacroiliac joint bilaterally.   Distal aspect of the screw at the S1 level.  IMPRESSION: Open reduction and internal fixation of pelvic separation.  Original Report Authenticated By: Fuller Canada, M.D.   Dg Chest Port 1 View  04/09/2012  *RADIOLOGY REPORT*  Clinical Data: Right upper lobe atelectasis  PORTABLE CHEST - 1 VIEW  Comparison: 04/08/2012  Findings: There is a nasogastric tube with tip in the stomach. There is a ET tube with tip above the carina.  Left-sided chest tube is noted.  Heart size is normal.  Right pleural effusion and interstitial edema is again noted.  Bilateral areas of atelectasis are unchanged.  There are multiple left posterior rib fracture deformities.  IMPRESSION:  1.  Stable exam. 2.  No significant change in aeration to the lungs.  Original Report Authenticated By: Rosealee Albee, M.D.   Dg Chest Port 1 View  04/08/2012  *RADIOLOGY REPORT*  Clinical Data: Evaluate endotracheal tube  PORTABLE CHEST - 1 VIEW  Comparison: 04/08/2012; 04/07/2012; 04/07/2012  Findings:  Grossly unchanged cardiac silhouette and mediastinal contours given patient rotation.  Interval advancement of endotracheal tube with tip now approximately 3 cm superior to the carina.  Otherwise, stable positioning of support apparatus.  No definite pneumothorax. There is persistent atelectasis/collapse of the right upper lobe. Interval increase in small right-sided pleural effusion. Interval worsening of bibasilar heterogeneous / consolidative opacities with new retrocardiac air bronchograms.  Grossly unchanged bones including multiple displaced left-sided posterior lateral rib fractures.  IMPRESSION: 1.  Interval advancement endotracheal tube with tip now 3 cm from the carina.  Otherwise, stable position of support apparatus.  No definite pneumothorax. 2.  Persistent atelectasis/collapse of the right upper lobe with minimal increase in right-sided pleural effusion. 3.  Worsening of bilateral mid and lower lung predominant heterogeneous  opacities, atelectasis versus infiltrate.  Original Report Authenticated By: Waynard Reeds, M.D.   Dg Chest Port 1 View  04/08/2012  *RADIOLOGY REPORT*  Clinical Data: Ventilator dependent respiratory failure.  PORTABLE CHEST - 1 VIEW  Comparison: 04/07/2012  Findings: Endotracheal tube tip is 7.0 cm above the carina. Nasogastric tube enters the stomach, with side port now within the stomach.  Left subclavian line tip projects over the SVC.  Left-sided chest tube noted with multiple left-sided rib fractures. Currently no pneumothorax is identified.  Complete atelectasis of the right upper lobe persists.  Mild patchy airspace opacity in the right lung is essentially stable, with faint left perihilar opacity as well.  Heart size remains within normal limits.  IMPRESSION:  1.  Similar appearance to the prior exam, with persistent complete atelectasis of the right upper lobe, patchy airspace opacities in the right lung and the left perihilar region, and left-sided chest tube in place. 2.  Multiple left-sided rib fractures.  Original Report Authenticated By: Dellia Cloud, M.D.   Dg Chest Port 1 View  04/07/2012  *RADIOLOGY REPORT*  Clinical Data: Postop.  Evaluate pneumothorax.  PORTABLE CHEST - 1 VIEW  Comparison: 04/07/2012 6:18 a.m.  Findings: Endotracheal tube tip 5.8 cm above the carina.  Left central line tip has been retracted slightly projecting at the expected level of the left bracheocephalic vein.  Nasogastric tube courses below the diaphragm.  The  tip is not included on this exam. This has been advanced as the nasogastric tube side hole is now not visualized.  Multiple left rib fractures.  Left-sided chest is in place. Questionable tiny left lateral pneumothorax.  Consolidation right upper lobe unchanged suggestive of atelectasis.  Patchy opacity peripheral aspect right lower lobe stable.  Prominence of hila structures more notable on the right.  Left base medially located subsegmental  atelectasis.  IMPRESSION: Endotracheal tube tip 5.8 cm above the carina.  Left central line tip has been retracted slightly projecting at the expected level of the left bracheocephalic vein.  Nasogastric tube courses below the diaphragm.  The tip is not included on this exam. This has been advanced as the nasogastric tube side hole is now not visualized.  Multiple left rib fractures.  Left-sided chest is in place. Questionable tiny left lateral pneumothorax.  Consolidation right upper lobe unchanged suggestive of atelectasis.  Patchy opacity peripheral aspect right lower lobe stable.  Prominence of hila structures more notable on the right.  Left base medially located subsegmental atelectasis.  Original Report Authenticated By: Fuller Canada, M.D.   Dg Femur Left Port  04/07/2012  *RADIOLOGY REPORT*  Clinical Data: Left femur fixation with intramedullary nail and hip pinning.  PORTABLE LEFT FEMUR - 2 VIEW  Comparison: 04/03/2012  Findings: Technical factors related to patient body habitus reduce diagnostic sensitivity and specificity.  Three cannulated screws extend across the basicervical femoral neck fracture.  Bony detail is obscured by body habitus and pannus.  Retrograde intramedullary rod noted with to proximal and three distal interlocking screws.  The IM nail traverses the comminuted segmental proximal fibular fracture.  There is 4 degrees of apex anterior angulation of the distal fracture margin along with 9 mm of posterior displacement of the distal fracture fragment with respect to the proximal.  IMPRESSION:  1.  Retrograde intramedullary femoral rod noted traversing the comminuted fracture.  Minimal residual displacement and angulation as noted above. 2.  Cannulated screw fixation of the femoral neck fracture. 3.  The external fixator has been removed.  Original Report Authenticated By: Dellia Cloud, M.D.   Dg Tibia/fibula Left Port  04/07/2012  *RADIOLOGY REPORT*  Clinical Data:  Postoperative for intramedullary nail in the left tibia.  PORTABLE LEFT TIBIA AND FIBULA - 2 VIEW  Comparison: Multiple exams, including 04/07/2010  Findings: Dedicated radiographs of the right tibia / fibula demonstrate an antegrade intramedullary nail with to proximal orthogonal screws and three distal screws for fixation.  The nail traverses the distal tibial diaphyseal fracture.  There is also a comminuted segmental distal fibular fracture.  Alignment is satisfactory.  There may be a fragment absent anteriorly at the distal tibial fracture site.  Also, distal half of the patella appears to have been resected.  Gas noted in the knee joint.  IMPRESSION:  1.  Antegrade intramedullary rod traverses the distal tibial fracture site, with good alignment.  There may be an absent fragment anteriorly. 2.  Comminuted distal fibular fracture. 3.  Apparent resection of the distal half of the patella.  Original Report Authenticated By: Dellia Cloud, M.D.   Dg C-arm Gt 120 Min  04/07/2012  *RADIOLOGY REPORT*  Clinical Data: Fracture.  DG C-ARM GT 120 MIN  Technique: Six intraoperative C-arm views submitted for review after surgery.  Comparison:  04/03/2012.  Findings: Three femoral neck screws are in place transfixing the base of the femoral neck fracture.  Femoral rod placed across a markedly comminuted left femur  fracture with distal and proximal fixation screws.  Major fracture fragments better aligned although still slightly separated.  External fixation screws/pins removed.  IMPRESSION: Open reduction and internal fixation of comminuted left femoral shaft fracture.  Please see above.  Original Report Authenticated By: Fuller Canada, M.D.   Dg Knee 2 Views Left  04/07/2012  *RADIOLOGY REPORT*  Clinical Data: Patellar fracture.  LEFT KNEE - 3 VIEW  Comparison: 04/03/2012.  Findings: Two view of the left knee reveals femoral and tibial rods in place with fixation screws.  Fracture of the patella.  Inferior  fracture fragment may have been surgically removed or reduced but incompletely assessed.  Clinical correlation recommended.  IMPRESSION: Patellar fracture.  Please see above.  Original Report Authenticated By: Fuller Canada, M.D.    Assessment/Plan: Febrile - CPM - will follow  LOS: 7 days     Mj Willis R, MD 04/09/2012, 5:53 PM

## 2012-04-09 NOTE — Progress Notes (Addendum)
Patient ID: Miguel Brady, male   DOB: [birth year], 51 y.o.   MRN: [id number] Follow up - Trauma Critical Care  Patient Details:    Miguel Brady is an 51 y.o. male.  Lines/tubes : AIRWAYS 7.5 mm (Active)  Secured at (cm) 23 cm 04/02/2012 12:00 AM     Airway 7.5 mm (Active)  Secured at (cm) 24 cm 04/09/2012  3:38 AM  Measured From Lips 04/09/2012  3:38 AM  Secured Location Center 04/09/2012  3:38 AM  Secured By [employer] 04/09/2012  3:38 AM  Tube Holder Repositioned Yes 04/09/2012  3:38 AM  Cuff Pressure (cm H2O) 28 cm H2O 04/09/2012  3:38 AM  Site Condition Dry 04/09/2012  3:38 AM     CVC Double Lumen 04/03/12 Left Subclavian (Active)  Site Assessment Clean;Intact;Dry 04/08/2012  8:00 PM  Proximal Lumen Status Infusing 04/08/2012  8:00 AM  Distal Lumen Status Infusing 04/08/2012  8:00 AM  Dressing Type Transparent;Occlusive 04/08/2012  8:00 PM  Dressing Status Clean;Dry;Intact 04/08/2012  8:00 PM  Dressing Intervention Dressing changed;New dressing;Antimicrobial disc changed 04/08/2012  4:00 PM  Dressing Change Due 04/15/12 04/08/2012  4:00 PM     Arterial Line 04/03/12 Right Radial (Active)  Site Assessment Clean;Dry;Intact 04/08/2012  8:00 PM  Line Status Pulsatile blood flow 04/08/2012  8:00 PM  Art Line Waveform Whip 04/08/2012  8:00 PM  Art Line Interventions Zeroed and calibrated;Leveled;Tubing changed;Connections checked and tightened;Flushed per protocol 04/08/2012 10:36 AM  Color/Movement/Sensation Capillary refill less than 3 sec 04/08/2012  8:00 PM  Dressing Type Occlusive;Transparent 04/08/2012  8:00 PM  Dressing Status Clean;Dry;Intact 04/08/2012  8:00 PM  Interventions New dressing;Tubing changed 04/08/2012 10:36 AM  Dressing Change Due 04/11/12 04/08/2012 10:36 AM     Chest Tube 1 Left Pleural (Active)  Suction To water seal 04/08/2012  8:00 PM  Chest Tube Air Leak None 04/08/2012  8:00 PM  Patency Intervention Tip/tilt 04/06/2012  8:00 PM  Drainage Description  Serosanguineous 04/08/2012  8:00 PM  Dressing Status Dry;Intact;Clean 04/08/2012  8:00 PM  Dressing Intervention Dressing changed 04/08/2012  6:00 AM  Site Assessment Clean;Dry;Intact 04/08/2012  8:00 AM  Surrounding Skin Unable to view 04/08/2012  8:00 AM  Output (mL) 50 mL 04/08/2012 12:00 PM     Negative Pressure Wound Therapy Leg Left (Active)  Cycle Continuous 04/08/2012  8:00 PM  Target Pressure (mmHg) 100 04/08/2012  8:00 PM  Canister Changed No 04/08/2012  8:00 PM  Dressing Status Intact 04/08/2012  8:00 PM  Drainage Amount Minimal 04/08/2012  8:00 PM  Drainage Description Serosanguineous 04/08/2012  8:00 PM  Output (mL) 50 mL 04/09/2012  4:00 AM     NG/OG Tube Orogastric 16 Fr. Center mouth (Active)  Placement Verification Auscultation 04/08/2012  8:00 AM  Site Assessment Clean;Dry;Intact 04/08/2012  8:00 AM  Status Irrigated 04/08/2012  8:00 AM  Drainage Appearance Bile 04/07/2012  8:00 PM  Gastric Residual 20 mL 04/08/2012  6:00 AM  Intake (mL) 40 mL 04/09/2012  7:00 AM  Output (mL) 150 mL 04/06/2012  6:26 AM     Urethral Catheter Non-latex;Temperature probe (Active)  Indication for Insertion or Continuance of Catheter Urinary output monitoring 04/08/2012  8:00 AM  Site Assessment Clean;Intact 04/08/2012  8:00 PM  Collection Container Standard drainage bag 04/08/2012  8:00 PM  Securement Method Leg strap 04/08/2012  8:00 PM  Urinary Catheter Interventions Unclamped 04/08/2012  8:00 PM  Output (mL) 200 mL 04/09/2012  6:00 AM    Microbiology/Sepsis markers: Results for orders placed during  the hospital encounter of 04/02/12  MRSA PCR SCREENING     Status: Abnormal   Collection Time   04/03/12  5:45 AM      Component Value Range Status Comment   MRSA by PCR POSITIVE (*) NEGATIVE Final   CULTURE, BAL-QUANTITATIVE     Status: Normal (Preliminary result)   Collection Time   04/07/12  5:00 PM      Component Value Range Status Comment   Specimen Description BRONCHIAL ALVEOLAR LAVAGE   Final     Special Requests NONE   Final    Gram Stain     Final    Value: NO WBC SEEN     NO SQUAMOUS EPITHELIAL CELLS SEEN     NO ORGANISMS SEEN   Colony Count PENDING   Incomplete    Culture Culture reincubated for better growth   Final    Report Status PENDING   Incomplete     Anti-infectives:  Anti-infectives     Start     Dose/Rate Route Frequency Ordered Stop   04/07/12 2200   ceFAZolin (ANCEF) IVPB 2 g/50 mL premix        2 g 100 mL/hr over 30 Minutes Intravenous 3 times per day 04/07/12 1818 04/08/12 1459   04/06/12 0830   ceFAZolin (ANCEF) 3 g in dextrose 5 % 50 mL IVPB        3 g 160 mL/hr over 30 Minutes Intravenous  Once 04/06/12 0809 04/07/12 1403   04/03/12 1400   ceFAZolin (ANCEF) IVPB 1 g/50 mL premix        1 g 100 mL/hr over 30 Minutes Intravenous 3 times per day 04/03/12 0930 04/07/12 1359   04/03/12 1200   gentamicin (GARAMYCIN) 700 mg in dextrose 5 % 100 mL IVPB        700 mg 117.5 mL/hr over 60 Minutes Intravenous Every 24 hours 04/03/12 1035 04/04/12 1340   04/03/12 0600   ceFAZolin (ANCEF) IVPB 1 g/50 mL premix  Status:  Discontinued        1 g 100 mL/hr over 30 Minutes Intravenous 3 times per day 04/03/12 0550 04/03/12 0930   04/03/12 0249   polymyxin B 500,000 Units, bacitracin 50,000 Units in sodium chloride irrigation 0.9 % 500 mL irrigation  Status:  Discontinued          As needed 04/03/12 0249 04/03/12 0528   04/02/12 2315   ceFAZolin (ANCEF) IVPB 1 g/50 mL premix        1 g 100 mL/hr over 30 Minutes Intravenous  Once 04/02/12 2304 04/03/12 0125          Best Practice/Protocols:  VTE Prophylaxis: Lovenox (prophylaxtic dose) and Mechanical Continous Sedation  Consults: Treatment Team:  Harvie Junior, MD Clydene Fake, MD Budd Palmer, MD    Studies:   Dg Pelvis Comp Min 3v  04/07/2012  *RADIOLOGY REPORT*  Clinical Data: Trauma and status post ORIF of the pelvis and left lower extremity.  JUDET PELVIS - 3+ VIEW  Comparison: Multiple  prior examinations including intraoperative imaging earlier today  Findings: Alignment at the level of the sacroiliac joints and pubic symphysis appear improved after ORIF.  Transversely oriented screws present across the upper sacroiliac joints and superior reconstruction plate noted across the pubic symphysis.  There remains some probable widening of the inferior sacroiliac joints bilaterally based on radiographic appearance.  The pubic symphysis shows normal alignment.  Alignment is improved at the level of a left femoral neck  fracture.  IMPRESSION: After ORIF, alignment is improved at the level of symphysial diastasis.  Screws are present across the sacroiliac joints with some persistent widening of the inferior SI joints radiographically.  Original Report Authenticated By: Reola Calkins, M.D.   Dg Pelvis Comp Min 3v  04/07/2012  *RADIOLOGY REPORT*  Clinical Data: Pelvic separation.  JUDET PELVIS - 3+ VIEW  Comparison: 04/02/2012.  Findings: Seven intraoperative C-arm views submitted for review at surgery.  Plate and screw transfix the superior pubic ramus and body which are now better aligned.  One of the screws may traverse the lateral border of the left pubic body.  Screws traverse the sacroiliac joint bilaterally.  Distal aspect of the screw at the S1 level.  IMPRESSION: Open reduction and internal fixation of pelvic separation.  Original Report Authenticated By: Fuller Canada, M.D.   Dg Chest Port 1 View  04/08/2012  *RADIOLOGY REPORT*  Clinical Data: Evaluate endotracheal tube  PORTABLE CHEST - 1 VIEW  Comparison: 04/08/2012; 04/07/2012; 04/07/2012  Findings:  Grossly unchanged cardiac silhouette and mediastinal contours given patient rotation.  Interval advancement of endotracheal tube with tip now approximately 3 cm superior to the carina.  Otherwise, stable positioning of support apparatus.  No definite pneumothorax. There is persistent atelectasis/collapse of the right upper lobe. Interval  increase in small right-sided pleural effusion. Interval worsening of bibasilar heterogeneous / consolidative opacities with new retrocardiac air bronchograms.  Grossly unchanged bones including multiple displaced left-sided posterior lateral rib fractures.  IMPRESSION: 1.  Interval advancement endotracheal tube with tip now 3 cm from the carina.  Otherwise, stable position of support apparatus.  No definite pneumothorax. 2.  Persistent atelectasis/collapse of the right upper lobe with minimal increase in right-sided pleural effusion. 3.  Worsening of bilateral mid and lower lung predominant heterogeneous opacities, atelectasis versus infiltrate.  Original Report Authenticated By: Waynard Reeds, M.D.   Dg Chest Port 1 View  04/08/2012  *RADIOLOGY REPORT*  Clinical Data: Ventilator dependent respiratory failure.  PORTABLE CHEST - 1 VIEW  Comparison: 04/07/2012  Findings: Endotracheal tube tip is 7.0 cm above the carina. Nasogastric tube enters the stomach, with side port now within the stomach.  Left subclavian line tip projects over the SVC.  Left-sided chest tube noted with multiple left-sided rib fractures. Currently no pneumothorax is identified.  Complete atelectasis of the right upper lobe persists.  Mild patchy airspace opacity in the right lung is essentially stable, with faint left perihilar opacity as well.  Heart size remains within normal limits.  IMPRESSION:  1.  Similar appearance to the prior exam, with persistent complete atelectasis of the right upper lobe, patchy airspace opacities in the right lung and the left perihilar region, and left-sided chest tube in place. 2.  Multiple left-sided rib fractures.  Original Report Authenticated By: Dellia Cloud, M.D.      Events:  Subjective:    Overnight Issues: stable, RN noted spontaneous movement of extremities  Objective:  Vital signs for last 24 hours: Temp:  [99 F (37.2 C)-100.4 F (38 C)] 100.4 F (38 C) (07/25  0700) Pulse Rate:  [109-126] 119  (07/25 0700) Resp:  [20] 20  (07/25 0700) BP: (100-163)/(47-90) 163/77 mmHg (07/25 0338) SpO2:  [96 %-100 %] 96 % (07/25 0700) Arterial Line BP: (101-195)/(55-89) 133/69 mmHg (07/25 0700) FiO2 (%):  [39.8 %-50.3 %] 40.1 % (07/25 0700) Weight:  [147.2 kg (324 lb 8.3 oz)] 147.2 kg (324 lb 8.3 oz) (07/25 0600)  Hemodynamic parameters for  last 24 hours: CVP:  [20 mmHg-22 mmHg] 20 mmHg  Intake/Output from previous day: 07/24 0701 - 07/25 0700 In: 3540 [I.V.:2500; NG/GT:990; IV Piggyback:50] Out: 2020 [Urine:1920; Drains:50; Chest Tube:50]  Intake/Output this shift:    Vent settings for last 24 hours: Vent Mode:  [-] PRVC FiO2 (%):  [39.8 %-50.3 %] 40.1 % Set Rate:  [20 bmp] 20 bmp Vt Set:  [600 mL] 600 mL PEEP:  [8 cmH20] 8 cmH20 Plateau Pressure:  [26 cmH20-40 cmH20] 36 cmH20  Physical Exam:  General: on vent HEENT/Neck: collar Resp: few rhonchi R>L CVS: RRR GI: soft, NT, +BS Extremities: some edema hands and feet, VAC LLE Neuro: opens eyes to voice, not F/C but sedation on  Results for orders placed during the hospital encounter of 04/02/12 (from the past 24 hour(s))  GLUCOSE, CAPILLARY     Status: Abnormal   Collection Time   04/08/12  8:43 AM      Component Value Range   Glucose-Capillary 119 (*) 70 - 99 mg/dL  GLUCOSE, CAPILLARY     Status: Abnormal   Collection Time   04/08/12 12:02 PM      Component Value Range   Glucose-Capillary 132 (*) 70 - 99 mg/dL   Comment 1 Notify RN     Comment 2 Documented in Chart    POCT I-STAT 3, BLOOD GAS (G3+)     Status: Abnormal   Collection Time   04/08/12 12:11 PM      Component Value Range   pH, Arterial 7.372  7.350 - 7.450   pCO2 arterial 48.3 (*) 35.0 - 45.0 mmHg   pO2, Arterial 102.0 (*) 80.0 - 100.0 mmHg   Bicarbonate 27.9 (*) 20.0 - 24.0 mEq/L   TCO2 29  0 - 100 mmol/L   O2 Saturation 97.0     Acid-Base Excess 2.0  0.0 - 2.0 mmol/L   Patient temperature 37.6 C     Collection site  RADIAL, ALLEN'S TEST ACCEPTABLE     Drawn by Operator     Sample type ARTERIAL    GLUCOSE, CAPILLARY     Status: Abnormal   Collection Time   04/08/12  4:10 PM      Component Value Range   Glucose-Capillary 141 (*) 70 - 99 mg/dL   Comment 1 Documented in Chart     Comment 2 Notify RN    GLUCOSE, CAPILLARY     Status: Abnormal   Collection Time   04/08/12  8:18 PM      Component Value Range   Glucose-Capillary 147 (*) 70 - 99 mg/dL   Comment 1 Documented in Chart     Comment 2 Notify RN    GLUCOSE, CAPILLARY     Status: Abnormal   Collection Time   04/08/12 11:59 PM      Component Value Range   Glucose-Capillary 134 (*) 70 - 99 mg/dL  GLUCOSE, CAPILLARY     Status: Abnormal   Collection Time   04/09/12  4:04 AM      Component Value Range   Glucose-Capillary 145 (*) 70 - 99 mg/dL  CBC WITH DIFFERENTIAL     Status: Abnormal   Collection Time   04/09/12  5:00 AM      Component Value Range   WBC 13.7 (*) 4.0 - 10.5 K/uL   RBC 2.86 (*) 4.22 - 5.81 MIL/uL   Hemoglobin 8.0 (*) 13.0 - 17.0 g/dL   HCT 16.1 (*) 09.6 - 04.5 %   MCV 85.7  78.0 - 100.0 fL   MCH 28.0  26.0 - 34.0 pg   MCHC 32.7  30.0 - 36.0 g/dL   RDW 45.4 (*) 09.8 - 11.9 %   Platelets 115 (*) 150 - 400 K/uL   Neutrophils Relative 73  43 - 77 %   Neutro Abs 10.0 (*) 1.7 - 7.7 K/uL   Lymphocytes Relative 14  12 - 46 %   Lymphs Abs 2.0  0.7 - 4.0 K/uL   Monocytes Relative 11  3 - 12 %   Monocytes Absolute 1.5 (*) 0.1 - 1.0 K/uL   Eosinophils Relative 1  0 - 5 %   Eosinophils Absolute 0.2  0.0 - 0.7 K/uL   Basophils Relative 0  0 - 1 %   Basophils Absolute 0.0  0.0 - 0.1 K/uL  BASIC METABOLIC PANEL     Status: Abnormal   Collection Time   04/09/12  5:00 AM      Component Value Range   Sodium 146 (*) 135 - 145 mEq/L   Potassium 3.6  3.5 - 5.1 mEq/L   Chloride 111  96 - 112 mEq/L   CO2 29  19 - 32 mEq/L   Glucose, Bld 146 (*) 70 - 99 mg/dL   BUN 15  6 - 23 mg/dL   Creatinine, Ser 1.47  0.50 - 1.35 mg/dL   Calcium  7.0 (*) 8.4 - 10.5 mg/dL   GFR calc non Af Amer 81 (*) >90 mL/min   GFR calc Af Amer >90  >90 mL/min  BLOOD GAS, ARTERIAL     Status: Abnormal   Collection Time   04/09/12  5:08 AM      Component Value Range   FIO2 0.50     Delivery systems VENTILATOR     Mode PRESSURE REGULATED VOLUME CONTROL     VT 600     Rate 20.0     Peep/cpap 8.0     pH, Arterial 7.410  7.350 - 7.450   pCO2 arterial 46.0 (*) 35.0 - 45.0 mmHg   pO2, Arterial 117.0 (*) 80.0 - 100.0 mmHg   Bicarbonate 28.2 (*) 20.0 - 24.0 mEq/L   TCO2 29.6  0 - 100 mmol/L   Acid-Base Excess 4.0 (*) 0.0 - 2.0 mmol/L   O2 Saturation 99.8     Patient temperature 100.4     Collection site A-LINE     Drawn by [id number]     Sample type ARTERIAL DRAW      Assessment & Plan: Present on Admission:  **None**   LOS: 7 days   Additional comments:I reviewed the patient's new clinical lab test results.  [facility] Open frontal bone fracture - S/P repair by Dr. Phoebe Perch, neuro status re-eval with wake-up assesment B rib Fx, L HTX - CXR P, CT only put out 50cc so if no PTX will D/C VDRF/RUL ATX - bronchoscopy in OR 7/23, CT chest if RUL ATX does not improve, plateau pressures 35 so will decrease TV and increase RR, F/U ABG ID - BAL P, off ABX, low grade temp and WBC 13.7 APC 2 pelvic ring Fx, open L segmental and proximal femur Fx, open L tib fib Fx - S/P ant plate and B SI screws by Dr. Carola Frost FEN - tol TF, supplement K, Lasix 20mg  now ABL anemia - down slightly but + on fluids VTE - filter, start Lovenox Critical Care Total Time*: 41 Minutes  Violeta Gelinas, MD, MPH, FACS Pager: [phone]  04/09/2012  *Care during the  described time interval was provided by me and/or other providers on the critical care team.  I have reviewed this patient's available data, including medical history, events of note, physical examination and test results as part of my evaluation.
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
## 2012-04-10 ENCOUNTER — Inpatient Hospital Stay (HOSPITAL_COMMUNITY): Payer: No Typology Code available for payment source

## 2012-04-10 LAB — TYPE AND SCREEN
ABO/RH(D): A POS
Antibody Screen: NEGATIVE
Unit division: 0
Unit division: 0

## 2012-04-10 LAB — GLUCOSE, CAPILLARY
Glucose-Capillary: 117 mg/dL — ABNORMAL HIGH (ref 70–99)
Glucose-Capillary: 123 mg/dL — ABNORMAL HIGH (ref 70–99)
Glucose-Capillary: 125 mg/dL — ABNORMAL HIGH (ref 70–99)
Glucose-Capillary: 138 mg/dL — ABNORMAL HIGH (ref 70–99)
Glucose-Capillary: 140 mg/dL — ABNORMAL HIGH (ref 70–99)

## 2012-04-10 LAB — CULTURE, BAL-QUANTITATIVE W GRAM STAIN
Colony Count: 60000
Gram Stain: NONE SEEN

## 2012-04-10 LAB — POCT I-STAT 3, ART BLOOD GAS (G3+)
Acid-Base Excess: 5 mmol/L — ABNORMAL HIGH (ref 0.0–2.0)
O2 Saturation: 97 %
TCO2: 30 mmol/L (ref 0–100)

## 2012-04-10 LAB — URINE CULTURE: Special Requests: NORMAL

## 2012-04-10 LAB — BASIC METABOLIC PANEL
CO2: 29 mEq/L (ref 19–32)
Chloride: 111 mEq/L (ref 96–112)
Creatinine, Ser: 1.33 mg/dL (ref 0.50–1.35)
Potassium: 3.2 mEq/L — ABNORMAL LOW (ref 3.5–5.1)

## 2012-04-10 LAB — CBC
Hemoglobin: 7.6 g/dL — ABNORMAL LOW (ref 13.0–17.0)
MCH: 27.9 pg (ref 26.0–34.0)
MCV: 86.8 fL (ref 78.0–100.0)
RBC: 2.72 MIL/uL — ABNORMAL LOW (ref 4.22–5.81)

## 2012-04-10 LAB — MAGNESIUM: Magnesium: 1.8 mg/dL (ref 1.5–2.5)

## 2012-04-10 MED ORDER — FUROSEMIDE 10 MG/ML IJ SOLN
40.0000 mg | Freq: Once | INTRAMUSCULAR | Status: AC
  Administered 2012-04-10: 40 mg via INTRAVENOUS
  Filled 2012-04-10: qty 4

## 2012-04-10 MED ORDER — POTASSIUM CHLORIDE 10 MEQ/50ML IV SOLN
10.0000 meq | INTRAVENOUS | Status: AC
  Administered 2012-04-10 (×4): 10 meq via INTRAVENOUS
  Filled 2012-04-10: qty 200

## 2012-04-10 NOTE — Progress Notes (Signed)
BP Cuff off due to arm weeping and causing agitation upon inflation, patient has arterial line for monitoring bp.

## 2012-04-10 NOTE — Progress Notes (Signed)
Subjective: Patient sedated intubated  Objective: Vital signs in last 24 hours: Temp:  [100.2 F (37.9 C)-103.1 F (39.5 C)] 100.2 F (37.9 C) (07/26 0823) Pulse Rate:  [102-130] 104  (07/26 0823) Resp:  [0-24] 24  (07/26 0823) BP: (106-177)/(53-83) 151/78 mmHg (07/26 0700) SpO2:  [95 %-100 %] 98 % (07/26 0823) Arterial Line BP: (111-188)/(50-83) 147/79 mmHg (07/26 0700) FiO2 (%):  [40 %-97.4 %] 40 % (07/26 0823) Weight:  [146 kg (321 lb 14 oz)] 146 kg (321 lb 14 oz) (07/26 0447)  Intake/Output from previous day: 07/25 0701 - 07/26 0700 In: 4102.3 [I.V.:1100.3; NG/GT:1750; IV Piggyback:1252] Out: 3490 [Urine:3415; Chest Tube:75] Intake/Output this shift:    Wound:forehead - healing neuro - intubated , sedated - opens eyes to stim - occasional sl. Movement in UE to stim  -   Lab Results:  Basename 04/10/12 0500 04/09/12 0500  WBC 13.3* 13.7*  HGB 7.6* 8.0*  HCT 23.6* 24.5*  PLT 153 115*   BMET  Basename 04/10/12 0500 04/09/12 0500  NA 147* 146*  K 3.2* 3.6  CL 111 111  CO2 29 29  GLUCOSE 142* 146*  BUN 22 15  CREATININE 1.33 1.05  CALCIUM 7.5* 7.0*    Studies/Results: Dg Chest Port 1 View  04/10/2012  *RADIOLOGY REPORT*  Clinical Data: Ventilator dependence.  PORTABLE CHEST - 1 VIEW  Comparison: 04/09/2012  Findings: 0614 hours.  Endotracheal tube tip is approximately 4.0 cm above the base of the carina. The NG tube passes into the stomach although the distal tip position is not included on the film.  Left subclavian central line tip projects at the innominate vein confluence.  Left chest tube has been removed in the interval. The bibasilar atelectasis persist.  There is patchy airspace disease in the right upper lung with some probable fluid in the minor fissure.  IMPRESSION: Interval removal of left chest tube without evidence for residual pneumothorax.  Slight increase in patchy airspace disease in the right upper lobe.  Original Report Authenticated By: ERIC A.  MANSELL, M.D.   Dg Chest Port 1 View  04/09/2012  *RADIOLOGY REPORT*  Clinical Data: Right upper lobe atelectasis  PORTABLE CHEST - 1 VIEW  Comparison: 04/08/2012  Findings: There is a nasogastric tube with tip in the stomach. There is a ET tube with tip above the carina.  Left-sided chest tube is noted.  Heart size is normal.  Right pleural effusion and interstitial edema is again noted.  Bilateral areas of atelectasis are unchanged.  There are multiple left posterior rib fracture deformities.  IMPRESSION:  1.  Stable exam. 2.  No significant change in aeration to the lungs.  Original Report Authenticated By: Rosealee Albee, M.D.   Dg Chest Port 1 View  04/08/2012  *RADIOLOGY REPORT*  Clinical Data: Evaluate endotracheal tube  PORTABLE CHEST - 1 VIEW  Comparison: 04/08/2012; 04/07/2012; 04/07/2012  Findings:  Grossly unchanged cardiac silhouette and mediastinal contours given patient rotation.  Interval advancement of endotracheal tube with tip now approximately 3 cm superior to the carina.  Otherwise, stable positioning of support apparatus.  No definite pneumothorax. There is persistent atelectasis/collapse of the right upper lobe. Interval increase in small right-sided pleural effusion. Interval worsening of bibasilar heterogeneous / consolidative opacities with new retrocardiac air bronchograms.  Grossly unchanged bones including multiple displaced left-sided posterior lateral rib fractures.  IMPRESSION: 1.  Interval advancement endotracheal tube with tip now 3 cm from the carina.  Otherwise, stable position of support apparatus.  No  definite pneumothorax. 2.  Persistent atelectasis/collapse of the right upper lobe with minimal increase in right-sided pleural effusion. 3.  Worsening of bilateral mid and lower lung predominant heterogeneous opacities, atelectasis versus infiltrate.  Original Report Authenticated By: Waynard Reeds, M.D.    Assessment/Plan: No sig change - will follow - may need  sutures out of forehead lac over next 2-4 days??  LOS: 8 days     Luvia Orzechowski R, MD 04/10/2012, 8:46 AM

## 2012-04-10 NOTE — Progress Notes (Signed)
Follow up - Trauma and Critical Care  Patient Details:    Miguel Brady is an 51 y.o. male.  Lines/tubes : AIRWAYS 7.5 mm (Active)  Secured at (cm) 23 cm 04/02/2012 12:00 AM     Airway 7.5 mm (Active)  Secured at (cm) 24 cm 04/10/2012  7:25 AM  Measured From Lips 04/10/2012  7:25 AM  Secured Location Center 04/10/2012  7:25 AM  Secured By [employer] 04/10/2012  7:25 AM  Tube Holder Repositioned Yes 04/10/2012  7:25 AM  Cuff Pressure (cm H2O) 28 cm H2O 04/10/2012  4:40 AM  Site Condition Dry 04/10/2012  4:40 AM     CVC Double Lumen 04/03/12 Left Subclavian (Active)  Indication for Insertion or Continuance of Line Prolonged intravenous therapies;Limited venous access - need for IV therapy >5 days (PICC only) 04/09/2012  8:00 PM  Site Assessment Clean;Intact;Dry 04/09/2012  8:00 PM  Proximal Lumen Status Infusing 04/09/2012  8:00 PM  Distal Lumen Status Infusing 04/09/2012  8:00 PM  Dressing Type Transparent;Occlusive 04/09/2012  8:00 PM  Dressing Status Clean;Dry;Intact 04/09/2012  8:00 PM  Dressing Intervention Dressing changed;New dressing;Antimicrobial disc changed 04/08/2012  4:00 PM  Dressing Change Due 04/15/12 04/08/2012  4:00 PM     Arterial Line 04/03/12 Right Radial (Active)  Site Assessment Clean;Dry;Intact 04/09/2012  8:00 PM  Line Status Pulsatile blood flow 04/09/2012  8:00 PM  Art Line Waveform Appropriate 04/09/2012  8:00 PM  Art Line Interventions Flushed per protocol 04/09/2012  8:00 PM  Color/Movement/Sensation Capillary refill less than 3 sec 04/09/2012  8:00 PM  Dressing Type Occlusive;Transparent 04/09/2012  8:00 PM  Dressing Status Clean;Dry;Intact 04/09/2012  8:00 PM  Interventions New dressing;Tubing changed 04/08/2012 10:36 AM  Dressing Change Due 04/11/12 04/08/2012 10:36 AM     Negative Pressure Wound Therapy Leg Left (Active)  Cycle Continuous 04/08/2012  8:00 PM  Target Pressure (mmHg) 100 04/08/2012  8:00 PM  Canister Changed No 04/08/2012  8:00 PM    Dressing Status Intact 04/09/2012  8:00 PM  Drainage Amount Minimal 04/09/2012  8:00 PM  Drainage Description Serous 04/09/2012  8:00 PM  Output (mL) 50 mL 04/09/2012  4:00 AM     NG/OG Tube Orogastric 16 Fr. Center mouth (Active)  Placement Verification Auscultation 04/09/2012  8:00 PM  Site Assessment Clean;Dry;Intact 04/09/2012  8:00 PM  Status Irrigated 04/09/2012  8:00 PM  Drainage Appearance Bile 04/09/2012  8:00 PM  Gastric Residual 20 mL 04/09/2012  8:00 AM  Intake (mL) 40 mL 04/10/2012  6:00 AM  Output (mL) 150 mL 04/06/2012  6:26 AM     Urethral Catheter Non-latex;Temperature probe (Active)  Indication for Insertion or Continuance of Catheter Urinary output monitoring 04/09/2012  8:00 PM  Site Assessment Clean;Intact 04/09/2012  8:00 PM  Collection Container Standard drainage bag 04/09/2012  8:00 PM  Securement Method Leg strap 04/09/2012  8:00 PM  Urinary Catheter Interventions Unclamped 04/09/2012  8:00 PM  Output (mL) 80 mL 04/10/2012  6:00 AM    Microbiology/Sepsis markers: Results for orders placed during the hospital encounter of 04/02/12  MRSA PCR SCREENING     Status: Abnormal   Collection Time   04/03/12  5:45 AM      Component Value Range Status Comment   MRSA by PCR POSITIVE (*) NEGATIVE Final   CULTURE, BAL-QUANTITATIVE     Status: Normal (Preliminary result)   Collection Time   04/07/12  5:00 PM      Component Value Range Status Comment   Specimen Description  BRONCHIAL ALVEOLAR LAVAGE   Final    Special Requests NONE   Final    Gram Stain     Final    Value: NO WBC SEEN     NO SQUAMOUS EPITHELIAL CELLS SEEN     NO ORGANISMS SEEN   Colony Count 60,000 COLONIES/ML   Final    Culture GRAM NEGATIVE RODS   Final    Report Status PENDING   Incomplete     Anti-infectives:  Anti-infectives     Start     Dose/Rate Route Frequency Ordered Stop   04/10/12 0600   vancomycin (VANCOCIN) 1,500 mg in sodium chloride 0.9 % 500 mL IVPB        1,500 mg 250 mL/hr over 120 Minutes  Intravenous Every 12 hours 04/09/12 1553     04/09/12 1800   vancomycin (VANCOCIN) 2,500 mg in sodium chloride 0.9 % 500 mL IVPB        2,500 mg 250 mL/hr over 120 Minutes Intravenous  Once 04/09/12 1553 04/09/12 2031   04/09/12 1530  piperacillin-tazobactam (ZOSYN) IVPB 3.375 g       3.375 g 12.5 mL/hr over 240 Minutes Intravenous 3 times per day 04/09/12 1516     04/07/12 2200   ceFAZolin (ANCEF) IVPB 2 g/50 mL premix        2 g 100 mL/hr over 30 Minutes Intravenous 3 times per day 04/07/12 1818 04/08/12 1459   04/06/12 0830   ceFAZolin (ANCEF) 3 g in dextrose 5 % 50 mL IVPB        3 g 160 mL/hr over 30 Minutes Intravenous  Once 04/06/12 0809 04/07/12 1403   04/03/12 1400   ceFAZolin (ANCEF) IVPB 1 g/50 mL premix        1 g 100 mL/hr over 30 Minutes Intravenous 3 times per day 04/03/12 0930 04/07/12 1359   04/03/12 1200   gentamicin (GARAMYCIN) 700 mg in dextrose 5 % 100 mL IVPB        700 mg 117.5 mL/hr over 60 Minutes Intravenous Every 24 hours 04/03/12 1035 04/04/12 1340   04/03/12 0600   ceFAZolin (ANCEF) IVPB 1 g/50 mL premix  Status:  Discontinued        1 g 100 mL/hr over 30 Minutes Intravenous 3 times per day 04/03/12 0550 04/03/12 0930   04/03/12 0249   polymyxin B 500,000 Units, bacitracin 50,000 Units in sodium chloride irrigation 0.9 % 500 mL irrigation  Status:  Discontinued          As needed 04/03/12 0249 04/03/12 0528   04/02/12 2315   ceFAZolin (ANCEF) IVPB 1 g/50 mL premix        1 g 100 mL/hr over 30 Minutes Intravenous  Once 04/02/12 2304 04/03/12 0125          Best Practice/Protocols:  GI Prophylaxis: Proton Pump Inhibitor Has IVC filter.  Not on any blood thinners Continous Sedation low level of IV sedation continuously  Consults: Treatment Team:  Harvie Junior, MD Clydene Fake, MD Budd Palmer, MD    Events:  Subjective:    Overnight Issues: Peak airway pressures have increased a bit.  No other overnight issues  Objective:    Vital signs for last 24 hours: Temp:  [100.4 F (38 C)-103.1 F (39.5 C)] 100.4 F (38 C) (07/26 0700) Pulse Rate:  [102-130] 105  (07/26 0700) Resp:  [0-24] 0  (07/26 0700) BP: (106-177)/(53-83) 151/78 mmHg (07/26 0700) SpO2:  [95 %-100 %] 100 % (  07/26 0725) Arterial Line BP: (108-188)/(50-83) 147/79 mmHg (07/26 0700) FiO2 (%):  [40 %-97.4 %] 50 % (07/26 0725) Weight:  [146 kg (321 lb 14 oz)] 146 kg (321 lb 14 oz) (07/26 0447)  Hemodynamic parameters for last 24 hours: CVP:  [17 mmHg-23 mmHg] 20 mmHg  Intake/Output from previous day: 07/25 0701 - 07/26 0700 In: 4015.3 [I.V.:1053.3; NG/GT:1710; IV Piggyback:1252] Out: 3370 [Urine:3295; Chest Tube:75]  Intake/Output this shift:    Vent settings for last 24 hours: Vent Mode:  [-] PRVC FiO2 (%):  [40 %-97.4 %] 50 % Set Rate:  [24 bmp] 24 bmp Vt Set:  [530 mL-600 mL] 600 mL PEEP:  [8 cmH20] 8 cmH20 Plateau Pressure:  [29 cmH20-36 cmH20] 36 cmH20  Physical Exam:  General: no respiratory distress and Opens eyes to stimulus.  Not fighting the ventilator Neuro: RASS -2 and Not following commands. Resp: clear to auscultation bilaterally and In spite of clear breath sounds, the RT  had just suctioned the patient..  CXR shows bilateral pulmonary edema, but RUL collapse is better. GI: soft, nontender, BS WNL, no r/g and No bowel movement, but seems to be tolerating tube feedings well Extremities: edema 2+ and Palpable pulses.  Less edema in LE than 2 days ago.  Results for orders placed during the hospital encounter of 04/02/12 (from the past 24 hour(s))  GLUCOSE, CAPILLARY     Status: Abnormal   Collection Time   04/09/12  8:19 AM      Component Value Range   Glucose-Capillary 136 (*) 70 - 99 mg/dL   Comment 1 Documented in Chart     Comment 2 Notify RN    GLUCOSE, CAPILLARY     Status: Abnormal   Collection Time   04/09/12 11:47 AM      Component Value Range   Glucose-Capillary 126 (*) 70 - 99 mg/dL   Comment 1 Documented in  Chart     Comment 2 Notify RN    POCT I-STAT 3, BLOOD GAS (G3+)     Status: Abnormal   Collection Time   04/09/12 12:55 PM      Component Value Range   pH, Arterial 7.324 (*) 7.350 - 7.450   pCO2 arterial 60.5 (*) 35.0 - 45.0 mmHg   pO2, Arterial 80.0  80.0 - 100.0 mmHg   Bicarbonate 30.8 (*) 20.0 - 24.0 mEq/L   TCO2 32  0 - 100 mmol/L   O2 Saturation 92.0     Acid-Base Excess 4.0 (*) 0.0 - 2.0 mmol/L   Patient temperature 39.0 C     Collection site RADIAL, ALLEN'S TEST ACCEPTABLE     Drawn by Operator     Sample type ARTERIAL     Comment NOTIFIED PHYSICIAN    GLUCOSE, CAPILLARY     Status: Abnormal   Collection Time   04/09/12  3:25 PM      Component Value Range   Glucose-Capillary 124 (*) 70 - 99 mg/dL   Comment 1 Notify RN    POCT I-STAT 3, BLOOD GAS (G3+)     Status: Abnormal   Collection Time   04/09/12  3:54 PM      Component Value Range   pH, Arterial 7.439  7.350 - 7.450   pCO2 arterial 44.4  35.0 - 45.0 mmHg   pO2, Arterial 94.0  80.0 - 100.0 mmHg   Bicarbonate 29.6 (*) 20.0 - 24.0 mEq/L   TCO2 31  0 - 100 mmol/L   O2 Saturation 97.0  Acid-Base Excess 5.0 (*) 0.0 - 2.0 mmol/L   Patient temperature 102.4 F     Collection site ARTERIAL LINE     Drawn by Operator     Sample type ARTERIAL    GLUCOSE, CAPILLARY     Status: Abnormal   Collection Time   04/09/12  7:34 PM      Component Value Range   Glucose-Capillary 135 (*) 70 - 99 mg/dL   Comment 1 Documented in Chart     Comment 2 Notify RN    GLUCOSE, CAPILLARY     Status: Abnormal   Collection Time   04/09/12 11:58 PM      Component Value Range   Glucose-Capillary 117 (*) 70 - 99 mg/dL  GLUCOSE, CAPILLARY     Status: Abnormal   Collection Time   04/10/12  3:51 AM      Component Value Range   Glucose-Capillary 129 (*) 70 - 99 mg/dL  CBC     Status: Abnormal   Collection Time   04/10/12  5:00 AM      Component Value Range   WBC 13.3 (*) 4.0 - 10.5 K/uL   RBC 2.72 (*) 4.22 - 5.81 MIL/uL   Hemoglobin 7.6  (*) 13.0 - 17.0 g/dL   HCT 95.6 (*) 21.3 - 08.6 %   MCV 86.8  78.0 - 100.0 fL   MCH 27.9  26.0 - 34.0 pg   MCHC 32.2  30.0 - 36.0 g/dL   RDW 57.8 (*) 46.9 - 62.9 %   Platelets 153  150 - 400 K/uL  MAGNESIUM     Status: Normal   Collection Time   04/10/12  5:00 AM      Component Value Range   Magnesium 1.8  1.5 - 2.5 mg/dL  BASIC METABOLIC PANEL     Status: Abnormal   Collection Time   04/10/12  5:00 AM      Component Value Range   Sodium 147 (*) 135 - 145 mEq/L   Potassium 3.2 (*) 3.5 - 5.1 mEq/L   Chloride 111  96 - 112 mEq/L   CO2 29  19 - 32 mEq/L   Glucose, Bld 142 (*) 70 - 99 mg/dL   BUN 22  6 - 23 mg/dL   Creatinine, Ser 5.28  0.50 - 1.35 mg/dL   Calcium 7.5 (*) 8.4 - 10.5 mg/dL   GFR calc non Af Amer 61 (*) >90 mL/min   GFR calc Af Amer 71 (*) >90 mL/min  POCT I-STAT 3, BLOOD GAS (G3+)     Status: Abnormal   Collection Time   04/10/12  5:24 AM      Component Value Range   pH, Arterial 7.452 (*) 7.350 - 7.450   pCO2 arterial 42.1  35.0 - 45.0 mmHg   pO2, Arterial 93.0  80.0 - 100.0 mmHg   Bicarbonate 29.1 (*) 20.0 - 24.0 mEq/L   TCO2 30  0 - 100 mmol/L   O2 Saturation 97.0     Acid-Base Excess 5.0 (*) 0.0 - 2.0 mmol/L   Patient temperature 38.2 C     Sample type ARTERIAL       Assessment/Plan:   NEURO  Altered Mental Status:  sedation   Plan: Seems to be improving.  May improve significantly once we are able to wean from the ventilator.  PULM  Atelectasis/collapse (focal and RUL) and Interstitial Lung Disease: pulmonary edema. Pneumothorax (traumatic and resolved and okay with left chest tube out.)   Plan: CPM.  Has a GNR growing from the BAL done a couple of days ago.  On empiric antibiotics currently.  CARDIO  Sinus Tachycardia   Plan: Physiologic.  No treatment necessary  RENAL  Improved output.  Creatinine up a bit   Plan: No specific treatment.  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  ENDO No specific problem   Plan: CPM  Global Issues  Overall the patient is slowly improving.  He is doing okay on the ventilator, and we may be able to decrease his FIO2.  Fluid overload by over 13 liters.  Will diurese again today.  Probably will need trach/PEG next week.    LOS: 8 days   Additional comments:I reviewed the patient's new clinical lab test results. cbc/bmet and I reviewed the patients new imaging test results. CXR]  Critical Care Total Time*: 30 Minutes  Irma Delancey O 04/10/2012  *Care during the described time interval was provided by me and/or other providers on the critical care team.  I have reviewed this patient's available data, including medical history, events of note, physical examination and test results as part of my evaluation.

## 2012-04-10 NOTE — Progress Notes (Signed)
UR complete 
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
## 2012-04-11 ENCOUNTER — Inpatient Hospital Stay (HOSPITAL_COMMUNITY): Payer: No Typology Code available for payment source

## 2012-04-11 LAB — CBC WITH DIFFERENTIAL/PLATELET
Eosinophils Absolute: 0.3 10*3/uL (ref 0.0–0.7)
Eosinophils Relative: 2 % (ref 0–5)
HCT: 23.6 % — ABNORMAL LOW (ref 39.0–52.0)
Lymphocytes Relative: 17 % (ref 12–46)
Lymphs Abs: 2.4 10*3/uL (ref 0.7–4.0)
MCH: 27.7 pg (ref 26.0–34.0)
MCV: 86.1 fL (ref 78.0–100.0)
Monocytes Absolute: 1.3 10*3/uL — ABNORMAL HIGH (ref 0.1–1.0)
Monocytes Relative: 9 % (ref 3–12)
RBC: 2.74 MIL/uL — ABNORMAL LOW (ref 4.22–5.81)
WBC: 14.1 10*3/uL — ABNORMAL HIGH (ref 4.0–10.5)

## 2012-04-11 LAB — BLOOD GAS, ARTERIAL
Acid-Base Excess: 6.3 mmol/L — ABNORMAL HIGH (ref 0.0–2.0)
Bicarbonate: 29.9 mEq/L — ABNORMAL HIGH (ref 20.0–24.0)
FIO2: 0.4 %
MECHVT: 600 mL
TCO2: 31.1 mmol/L (ref 0–100)
pCO2 arterial: 42 mmHg (ref 35.0–45.0)
pH, Arterial: 7.471 — ABNORMAL HIGH (ref 7.350–7.450)

## 2012-04-11 LAB — BASIC METABOLIC PANEL
BUN: 31 mg/dL — ABNORMAL HIGH (ref 6–23)
Chloride: 108 mEq/L (ref 96–112)
Creatinine, Ser: 1.38 mg/dL — ABNORMAL HIGH (ref 0.50–1.35)
GFR calc Af Amer: 67 mL/min — ABNORMAL LOW (ref >90)
GFR calc non Af Amer: 58 mL/min — ABNORMAL LOW (ref >90)
Potassium: 3.3 mEq/L — ABNORMAL LOW (ref 3.5–5.1)

## 2012-04-11 LAB — GLUCOSE, CAPILLARY
Glucose-Capillary: 111 mg/dL — ABNORMAL HIGH (ref 70–99)
Glucose-Capillary: 135 mg/dL — ABNORMAL HIGH (ref 70–99)
Glucose-Capillary: 136 mg/dL — ABNORMAL HIGH (ref 70–99)
Glucose-Capillary: 148 mg/dL — ABNORMAL HIGH (ref 70–99)

## 2012-04-11 LAB — VANCOMYCIN, TROUGH: Vancomycin Tr: 18.3 ug/mL (ref 10.0–20.0)

## 2012-04-11 MED ORDER — POTASSIUM CHLORIDE 10 MEQ/50ML IV SOLN
10.0000 meq | INTRAVENOUS | Status: AC
  Administered 2012-04-11 (×2): 10 meq via INTRAVENOUS
  Filled 2012-04-11: qty 100

## 2012-04-11 MED ORDER — POTASSIUM CHLORIDE 10 MEQ/100ML IV SOLN: 10.0000 meq | INTRAVENOUS | Status: DC
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
## 2012-04-11 NOTE — Progress Notes (Signed)
Subjective: Patient continues on ventilator via endotracheal tube. Continues to be sedated with Versed and fentanyl.  Objective: Vital signs in last 24 hours: Filed Vitals:   04/11/12 0454 04/11/12 0500 04/11/12 0600 04/11/12 0700  BP:  141/74 124/78 142/75  Pulse:  106 102 107  Temp:  99.7 F (37.6 C) 99.7 F (37.6 C) 99.7 F (37.6 C)  TempSrc:      Resp:  24 24 24   Height:      Weight: 141.114 kg (311 lb 1.6 oz)     SpO2:  100% 100% 99%    Intake/Output from previous day: 07/26 0701 - 07/27 0700 In: 3311.6 [I.V.:932.8; NG/GT:1050; IV Piggyback:1328.8] Out: 3960 [Urine:3960] Intake/Output this shift:    Physical Exam:  Opens eyes to stimulation, question of whether he held up 2 fingers with the right hand to command, but cannot get the patient to repeat that or any other following of commands. Basic motor response seems to be that of flexion to flexion withdrawal.  CBC  Basename 04/11/12 0420 04/10/12 0500  WBC 14.1* 13.3*  HGB 7.6* 7.6*  HCT 23.6* 23.6*  PLT 185 153   BMET  Basename 04/11/12 0420 04/10/12 0500  NA 147* 147*  K 3.3* 3.2*  CL 108 111  CO2 31 29  GLUCOSE 150* 142*  BUN 31* 22  CREATININE 1.38* 1.33  CALCIUM 8.0* 7.5*   ABG    Component Value Date/Time   PHART 7.471* 04/11/2012 0417   PCO2ART 42.0 04/11/2012 0417   PO2ART 88.0 04/11/2012 0417   HCO3 29.9* 04/11/2012 0417   TCO2 31.1 04/11/2012 0417   ACIDBASEDEF 2.0 04/03/2012 0659   O2SAT 98.4 04/11/2012 0417    Studies/Results: Dg Chest Port 1 View  04/11/2012  *RADIOLOGY REPORT*  Clinical Data: Respiratory failure  PORTABLE CHEST - 1 VIEW  Comparison: 04/10/2012  Findings: Cardiomediastinal silhouette is stable.  Stable endotracheal and NG tube position.  There is no diagnostic pneumothorax.  Patchy airspace disease in the right upper lobe again noted.  Central vascular congestion without convincing pulmonary edema.  Stable left subclavian central line position. There is new nodular patchy  airspace disease left midlung and lingula.  IMPRESSION: Stable endotracheal and NG tube position.  There is no diagnostic pneumothorax.  Patchy airspace disease in the right upper lobe again noted.  Central vascular congestion without convincing pulmonary edema.  Stable left subclavian central line position. There is new nodular patchy airspace disease left midlung and lingula.  Original Report Authenticated By: Natasha Mead, M.D.   Dg Chest Port 1 View  04/10/2012  *RADIOLOGY REPORT*  Clinical Data: Ventilator dependence.  PORTABLE CHEST - 1 VIEW  Comparison: 04/09/2012  Findings: 0614 hours.  Endotracheal tube tip is approximately 4.0 cm above the base of the carina. The NG tube passes into the stomach although the distal tip position is not included on the film.  Left subclavian central line tip projects at the innominate vein confluence.  Left chest tube has been removed in the interval. The bibasilar atelectasis persist.  There is patchy airspace disease in the right upper lung with some probable fluid in the minor fissure.  IMPRESSION: Interval removal of left chest tube without evidence for residual pneumothorax.  Slight increase in patchy airspace disease in the right upper lobe.  Original Report Authenticated By: ERIC A. MANSELL, M.D.    Assessment/Plan: Significant neurologic dysfunction despite CT scans admission and a few days later essentially being unremarkable. Prognosis uncertain.   Hewitt Shorts, MD 04/11/2012,  9:25 AM
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
## 2012-04-12 LAB — GLUCOSE, CAPILLARY
Glucose-Capillary: 105 mg/dL — ABNORMAL HIGH (ref 70–99)
Glucose-Capillary: 112 mg/dL — ABNORMAL HIGH (ref 70–99)
Glucose-Capillary: 138 mg/dL — ABNORMAL HIGH (ref 70–99)

## 2012-04-12 MED ORDER — SODIUM CHLORIDE 0.9 % IV SOLN
2.0000 mg/h | INTRAVENOUS | Status: DC
  Administered 2012-04-12 – 2012-04-13 (×3): 9 mg/h via INTRAVENOUS
  Administered 2012-04-13: 10 mg/h via INTRAVENOUS
  Administered 2012-04-14: 9 mg/h via INTRAVENOUS
  Administered 2012-04-14: 5 mg/h via INTRAVENOUS
  Filled 2012-04-12 (×8): qty 20

## 2012-04-12 NOTE — Progress Notes (Signed)
Patient ID: Miguel Brady, male   DOB: 15-Oct-1960, 51 y.o.   MRN: [id number] Follow up - Trauma and Critical Care  Patient Details:    Miguel Brady is an 51 y.o. male. No new changes over night.  Good UOP.  +BMs.  Tolerating tube feeds Lines/tubes : AIRWAYS 7.5 mm (Active)  Secured at (cm) 23 cm 04/02/2012 12:00 AM     Airway 7.5 mm (Active)  Secured at (cm) 23 cm 04/12/2012  4:35 AM  Measured From Lips 04/12/2012  4:35 AM  Secured Location Center 04/12/2012  4:35 AM  Secured By [employer] 04/12/2012  4:35 AM  Tube Holder Repositioned Yes 04/12/2012  4:35 AM  Cuff Pressure (cm H2O) 26 cm H2O 04/12/2012 12:15 AM  Site Condition Dry 04/12/2012  4:35 AM     CVC Double Lumen 04/03/12 Left Subclavian (Active)  Indication for Insertion or Continuance of Line Prolonged intravenous therapies 04/11/2012  9:00 PM  Site Assessment Clean;Dry;Intact 04/11/2012  9:00 PM  Proximal Lumen Status Infusing 04/11/2012  9:00 PM  Distal Lumen Status Infusing 04/11/2012  9:00 PM  Dressing Type Transparent 04/11/2012  9:00 PM  Dressing Status Clean;Dry;Intact;Antimicrobial disc in place 04/11/2012  9:00 PM  Line Care Connections checked and tightened 04/11/2012  9:00 PM  Dressing Intervention Dressing changed;New dressing;Antimicrobial disc changed 04/08/2012  4:00 PM  Dressing Change Due 04/15/12 04/08/2012  4:00 PM     Arterial Line 04/03/12 Right Radial (Active)  Site Assessment Clean;Dry;Intact 04/11/2012  9:00 PM  Line Status Positional 04/11/2012  9:00 PM  Art Line Waveform Whip 04/11/2012  9:00 PM  Art Line Interventions Leveled;Zeroed and calibrated;Line pulled back;Flushed per protocol 04/11/2012  9:00 PM  Color/Movement/Sensation Capillary refill less than 3 sec 04/11/2012  9:00 PM  Dressing Type Transparent;Occlusive 04/11/2012  9:00 PM  Dressing Status Clean;Dry;Intact 04/11/2012  9:00 PM  Interventions New dressing;Tubing changed 04/08/2012 10:36 AM  Dressing Change Due 04/11/12 04/08/2012 10:36  AM     NG/OG Tube Orogastric 16 Fr. Center mouth (Active)  Placement Verification Auscultation 04/11/2012  8:00 PM  Site Assessment Clean;Dry;Intact 04/11/2012  8:00 PM  Status Infusing tube feed 04/11/2012  8:00 PM  Drainage Appearance None;Other (Comment) 04/11/2012  8:00 AM  Gastric Residual 10 mL 04/12/2012 12:00 AM  Intake (mL) 40 mL 04/12/2012  6:00 AM  Output (mL) 150 mL 04/06/2012  6:26 AM     Urethral Catheter Non-latex;Temperature probe (Active)  Indication for Insertion or Continuance of Catheter Prolonged immobilization;Urinary output monitoring 04/11/2012  8:00 PM  Site Assessment Clean;Intact 04/11/2012  8:00 PM  Collection Container Standard drainage bag 04/11/2012  8:00 PM  Securement Method Leg strap 04/11/2012  8:00 PM  Urinary Catheter Interventions Unclamped 04/11/2012  8:00 AM  Output (mL) 100 mL 04/12/2012  6:00 AM    Microbiology/Sepsis markers: Results for orders placed during the hospital encounter of 04/02/12  MRSA PCR SCREENING     Status: Abnormal   Collection Time   04/03/12  5:45 AM      Component Value Range Status Comment   MRSA by PCR POSITIVE (*) NEGATIVE Final   CULTURE, BAL-QUANTITATIVE     Status: Normal   Collection Time   04/07/12  5:00 PM      Component Value Range Status Comment   Specimen Description BRONCHIAL ALVEOLAR LAVAGE   Final    Special Requests NONE   Final    Gram Stain     Final    Value: NO WBC SEEN  NO SQUAMOUS EPITHELIAL CELLS SEEN     NO ORGANISMS SEEN   Colony Count 60,000 COLONIES/ML   Final    Culture ENTEROBACTER CLOACAE   Final    Report Status 04/10/2012 FINAL   Final    Organism ID, Bacteria ENTEROBACTER CLOACAE   Final   CULTURE, BLOOD (ROUTINE X 2)     Status: Normal (Preliminary result)   Collection Time   04/09/12  3:43 PM      Component Value Range Status Comment   Specimen Description BLOOD LEFT HAND   Final    Special Requests BOTTLES DRAWN AEROBIC ONLY 2CC   Final    Culture  Setup Time 04/09/2012 22:20   Final     Culture     Final    Value:        BLOOD CULTURE RECEIVED NO GROWTH TO DATE CULTURE WILL BE HELD FOR 5 DAYS BEFORE ISSUING A FINAL NEGATIVE REPORT   Report Status PENDING   Incomplete   CULTURE, BLOOD (ROUTINE X 2)     Status: Normal (Preliminary result)   Collection Time   04/09/12  3:52 PM      Component Value Range Status Comment   Specimen Description BLOOD LEFT HAND   Final    Special Requests BOTTLES DRAWN AEROBIC ONLY 2CC   Final    Culture  Setup Time 04/09/2012 22:20   Final    Culture     Final    Value:        BLOOD CULTURE RECEIVED NO GROWTH TO DATE CULTURE WILL BE HELD FOR 5 DAYS BEFORE ISSUING A FINAL NEGATIVE REPORT   Report Status PENDING   Incomplete   URINE CULTURE     Status: Normal   Collection Time   04/09/12  6:30 PM      Component Value Range Status Comment   Specimen Description URINE, CATHETERIZED   Final    Special Requests Normal   Final    Culture  Setup Time 04/10/2012 01:16   Final    Colony Count NO GROWTH   Final    Culture NO GROWTH   Final    Report Status 04/10/2012 FINAL   Final     Anti-infectives:  Anti-infectives     Start     Dose/Rate Route Frequency Ordered Stop   04/10/12 0600   vancomycin (VANCOCIN) 1,500 mg in sodium chloride 0.9 % 500 mL IVPB        1,500 mg 250 mL/hr over 120 Minutes Intravenous Every 12 hours 04/09/12 1553     04/09/12 1800   vancomycin (VANCOCIN) 2,500 mg in sodium chloride 0.9 % 500 mL IVPB        2,500 mg 250 mL/hr over 120 Minutes Intravenous  Once 04/09/12 1553 04/09/12 2031   04/09/12 1530  piperacillin-tazobactam (ZOSYN) IVPB 3.375 g       3.375 g 12.5 mL/hr over 240 Minutes Intravenous 3 times per day 04/09/12 1516     04/07/12 2200   ceFAZolin (ANCEF) IVPB 2 g/50 mL premix        2 g 100 mL/hr over 30 Minutes Intravenous 3 times per day 04/07/12 1818 04/08/12 1459   04/06/12 0830   ceFAZolin (ANCEF) 3 g in dextrose 5 % 50 mL IVPB        3 g 160 mL/hr over 30 Minutes Intravenous  Once 04/06/12  0809 04/07/12 1403   04/03/12 1400   ceFAZolin (ANCEF) IVPB 1 g/50 mL premix  1 g 100 mL/hr over 30 Minutes Intravenous 3 times per day 04/03/12 0930 04/07/12 1359   04/03/12 1200   gentamicin (GARAMYCIN) 700 mg in dextrose 5 % 100 mL IVPB        700 mg 117.5 mL/hr over 60 Minutes Intravenous Every 24 hours 04/03/12 1035 04/04/12 1340   04/03/12 0600   ceFAZolin (ANCEF) IVPB 1 g/50 mL premix  Status:  Discontinued        1 g 100 mL/hr over 30 Minutes Intravenous 3 times per day 04/03/12 0550 04/03/12 0930   04/03/12 0249   polymyxin B 500,000 Units, bacitracin 50,000 Units in sodium chloride irrigation 0.9 % 500 mL irrigation  Status:  Discontinued          As needed 04/03/12 0249 04/03/12 0528   04/02/12 2315   ceFAZolin (ANCEF) IVPB 1 g/50 mL premix        1 g 100 mL/hr over 30 Minutes Intravenous  Once 04/02/12 2304 04/03/12 0125          Best Practice/Protocols:    Consults: Treatment Team:  Harvie Junior, MD Clydene Fake, MD Budd Palmer, MD    Events:  Subjective:    Overnight Issues: No changes  Objective:  Vital signs for last 24 hours: Temp:  [99 F (37.2 C)-100.2 F (37.9 C)] 99.1 F (37.3 C) (07/28 0700) Pulse Rate:  [94-113] 98  (07/28 0700) Resp:  [18-24] 24  (07/28 0700) BP: (110-149)/(56-87) 127/79 mmHg (07/28 0700) SpO2:  [97 %-100 %] 99 % (07/28 0700) Arterial Line BP: (107-189)/(56-92) 125/68 mmHg (07/28 0600) FiO2 (%):  [39.2 %-40.3 %] 39.9 % (07/28 0700) Weight:  [316 lb 5.8 oz (143.5 kg)] 316 lb 5.8 oz (143.5 kg) (07/28 0500)  Hemodynamic parameters for last 24 hours: CVP:  [17 mmHg-18 mmHg] 17 mmHg  Intake/Output from previous day: 07/27 0701 - 07/28 0700 In: 2747.8 [I.V.:1150.3; NG/GT:950; IV Piggyback:647.5] Out: 2450 [Urine:2450]  Intake/Output this shift:    Vent settings for last 24 hours: Vent Mode:  [-] PRVC FiO2 (%):  [39.2 %-40.3 %] 39.9 % Set Rate:  [24 bmp] 24 bmp Vt Set:  [600 mL] 600 mL PEEP:  [8  cmH20-8.1 cmH20] 8 cmH20 Plateau Pressure:  [26 cmH20-38 cmH20] 34 cmH20  Physical Exam:  Neuro:  Sedated, on vent Lungs:  Clear bilat, secretions thick Abdomen:  Soft, non distended  Results for orders placed during the hospital encounter of 04/02/12 (from the past 24 hour(s))  GLUCOSE, CAPILLARY     Status: Abnormal   Collection Time   04/11/12  8:31 AM      Component Value Range   Glucose-Capillary 148 (*) 70 - 99 mg/dL  GLUCOSE, CAPILLARY     Status: Abnormal   Collection Time   04/11/12 12:25 PM      Component Value Range   Glucose-Capillary 147 (*) 70 - 99 mg/dL  VANCOMYCIN, TROUGH     Status: Normal   Collection Time   04/11/12  4:35 PM      Component Value Range   Vancomycin Tr 18.3  10.0 - 20.0 ug/mL  GLUCOSE, CAPILLARY     Status: Abnormal   Collection Time   04/11/12  4:42 PM      Component Value Range   Glucose-Capillary 127 (*) 70 - 99 mg/dL   Comment 1 Documented in Chart     Comment 2 Notify RN    GLUCOSE, CAPILLARY     Status: Abnormal   Collection Time  04/11/12  7:41 PM      Component Value Range   Glucose-Capillary 111 (*) 70 - 99 mg/dL  GLUCOSE, CAPILLARY     Status: Abnormal   Collection Time   04/12/12 12:19 AM      Component Value Range   Glucose-Capillary 112 (*) 70 - 99 mg/dL  GLUCOSE, CAPILLARY     Status: Abnormal   Collection Time   04/12/12  4:20 AM      Component Value Range   Glucose-Capillary 107 (*) 70 - 99 mg/dL  GLUCOSE, CAPILLARY     Status: Abnormal   Collection Time   04/12/12  7:28 AM      Component Value Range   Glucose-Capillary 138 (*) 70 - 99 mg/dL   Comment 1 Notify RN       Assessment/Plan:   NEURO  Trauma-CNS:  depressed level of consciousness   Plan: continue current care  PULM  VDRL     Plan: continue current vent settings  CARDIO       RENAL  good uop   :   GI  tolerating tube feeds   Plan: continue tube feeds at goal  ID     Plan: continue antibiotics              Global Issues      LOS: 10  days     Critical Care Total Time*: 15 Minutes  Kalel Harty A 04/12/2012  *Care during the described time interval was provided by me and/or other providers on the critical care team.  I have reviewed this patient's available data, including medical history, events of note, physical examination and test results as part of my evaluation.

## 2012-04-12 NOTE — Progress Notes (Signed)
Patient ID: Miguel Brady, male   DOB: 10/03/1960, 51 y.o.   MRN: [id number] BP 142/76  Pulse 98  Temp 98.4 F (36.9 C) (Core (Comment))  Resp 24  Ht 5\' 11"  (1.803 m)  Wt 143.5 kg (316 lb 5.8 oz)  BMI 44.12 kg/m2  SpO2 97% Will open eyes to voice Not following commands Perrl, not tracking +cough, corneals Nurse reports seeing purposeful movements in upper extremities with sedation off. Continues to be intubated, sedated. No new recs

## 2012-04-13 ENCOUNTER — Encounter (HOSPITAL_COMMUNITY): Payer: Self-pay | Admitting: *Deleted

## 2012-04-13 ENCOUNTER — Inpatient Hospital Stay (HOSPITAL_COMMUNITY): Payer: No Typology Code available for payment source

## 2012-04-13 LAB — GLUCOSE, CAPILLARY
Glucose-Capillary: 120 mg/dL — ABNORMAL HIGH (ref 70–99)
Glucose-Capillary: 131 mg/dL — ABNORMAL HIGH (ref 70–99)

## 2012-04-13 LAB — BASIC METABOLIC PANEL
BUN: 31 mg/dL — ABNORMAL HIGH (ref 6–23)
Creatinine, Ser: 1.17 mg/dL (ref 0.50–1.35)
GFR calc Af Amer: 82 mL/min — ABNORMAL LOW (ref >90)
GFR calc non Af Amer: 71 mL/min — ABNORMAL LOW (ref >90)

## 2012-04-13 LAB — POCT I-STAT 3, ART BLOOD GAS (G3+)
Acid-Base Excess: 5 mmol/L — ABNORMAL HIGH (ref 0.0–2.0)
O2 Saturation: 96 %

## 2012-04-13 MED ORDER — CIPROFLOXACIN IN D5W 400 MG/200ML IV SOLN
400.0000 mg | Freq: Two times a day (BID) | INTRAVENOUS | Status: AC
  Administered 2012-04-13 – 2012-04-18 (×12): 400 mg via INTRAVENOUS
  Filled 2012-04-13 (×12): qty 200

## 2012-04-13 MED ORDER — SODIUM CHLORIDE 0.9 % IJ SOLN
10.0000 mL | Freq: Two times a day (BID) | INTRAMUSCULAR | Status: DC
  Administered 2012-04-14 – 2012-04-22 (×8): 10 mL
  Filled 2012-04-13: qty 30

## 2012-04-13 MED ORDER — FUROSEMIDE 10 MG/ML IJ SOLN
20.0000 mg | Freq: Once | INTRAMUSCULAR | Status: AC
  Administered 2012-04-13: 20 mg via INTRAVENOUS
  Filled 2012-04-13: qty 2

## 2012-04-13 MED ORDER — ENOXAPARIN SODIUM 30 MG/0.3ML ~~LOC~~ SOLN
30.0000 mg | Freq: Two times a day (BID) | SUBCUTANEOUS | Status: DC
  Administered 2012-04-13 – 2012-04-22 (×19): 30 mg via SUBCUTANEOUS
  Filled 2012-04-13 (×22): qty 0.3

## 2012-04-13 MED ORDER — SODIUM CHLORIDE 0.9 % IJ SOLN
10.0000 mL | INTRAMUSCULAR | Status: DC | PRN
  Administered 2012-04-21 (×2): 10 mL

## 2012-04-13 NOTE — Progress Notes (Signed)
Patient ID: Miguel Brady, male   DOB: [birth year], 51 y.o.   MRN: [id number] Follow up - Trauma Critical Care  Patient Details:    Miguel Brady is an 51 y.o. male.  Lines/tubes : AIRWAYS 7.5 mm (Active)  Secured at (cm) 23 cm 04/02/2012 12:00 AM     Airway 7.5 mm (Active)  Secured at (cm) 23 cm 04/13/2012  7:24 AM  Measured From Lips 04/13/2012  7:24 AM  Secured Location Center 04/13/2012  7:24 AM  Secured By [employer] 04/13/2012  7:24 AM  Tube Holder Repositioned Yes 04/13/2012  7:24 AM  Cuff Pressure (cm H2O) 24 cm H2O 04/12/2012 11:15 PM  Site Condition Dry 04/13/2012  4:20 AM     CVC Double Lumen 04/03/12 Left Subclavian (Active)  Indication for Insertion or Continuance of Line Prolonged intravenous therapies 04/12/2012  8:00 PM  Site Assessment Clean;Dry;Intact 04/12/2012  8:00 PM  Proximal Lumen Status Infusing 04/12/2012  8:00 PM  Distal Lumen Status Infusing 04/12/2012  8:00 PM  Dressing Type Transparent 04/12/2012  8:00 PM  Dressing Status Clean;Dry;Intact;Antimicrobial disc in place 04/12/2012  8:00 PM  Line Care Connections checked and tightened 04/12/2012  8:00 PM  Dressing Intervention Dressing changed;New dressing;Antimicrobial disc changed 04/08/2012  4:00 PM  Dressing Change Due 04/15/12 04/08/2012  4:00 PM     Arterial Line 04/03/12 Right Radial (Active)  Site Assessment Clean;Dry;Intact 04/12/2012  8:00 PM  Line Status Positional 04/12/2012  8:00 PM  Art Line Waveform Whip 04/12/2012  8:00 PM  Art Line Interventions Connections checked and tightened;Flushed per protocol 04/12/2012  8:00 PM  Color/Movement/Sensation Capillary refill less than 3 sec 04/12/2012  8:00 PM  Dressing Type Transparent 04/12/2012  8:00 PM  Dressing Status Clean;Dry;Intact 04/12/2012  8:00 PM  Interventions New dressing;Tubing changed 04/08/2012 10:36 AM  Dressing Change Due 04/11/12 04/08/2012 10:36 AM     NG/OG Tube Orogastric 16 Fr. Center mouth (Active)  Placement Verification  Auscultation 04/12/2012  8:00 PM  Site Assessment Clean;Dry;Intact 04/12/2012  8:00 PM  Status Infusing tube feed 04/12/2012  8:00 PM  Drainage Appearance None;Other (Comment) 04/11/2012  8:00 AM  Gastric Residual 10 mL 04/12/2012  8:00 PM  Intake (mL) 40 mL 04/13/2012  6:00 AM  Output (mL) 150 mL 04/06/2012  6:26 AM     Urethral Catheter Non-latex;Temperature probe (Active)  Indication for Insertion or Continuance of Catheter Urinary output monitoring;Prolonged immobilization;Physician order 04/12/2012  8:00 PM  Site Assessment Clean;Intact 04/12/2012  8:00 PM  Collection Container Standard drainage bag 04/12/2012  8:00 PM  Securement Method Leg strap 04/12/2012  8:00 PM  Urinary Catheter Interventions Unclamped 04/11/2012  8:00 AM  Output (mL) 75 mL 04/13/2012  6:00 AM    Microbiology/Sepsis markers: Results for orders placed during the hospital encounter of 04/02/12  MRSA PCR SCREENING     Status: Abnormal   Collection Time   04/03/12  5:45 AM      Component Value Range Status Comment   MRSA by PCR POSITIVE (*) NEGATIVE Final   CULTURE, BAL-QUANTITATIVE     Status: Normal   Collection Time   04/07/12  5:00 PM      Component Value Range Status Comment   Specimen Description BRONCHIAL ALVEOLAR LAVAGE   Final    Special Requests NONE   Final    Gram Stain     Final    Value: NO WBC SEEN     NO SQUAMOUS EPITHELIAL CELLS SEEN     NO ORGANISMS SEEN  Colony Count 60,000 COLONIES/ML   Final    Culture ENTEROBACTER CLOACAE   Final    Report Status 04/10/2012 FINAL   Final    Organism ID, Bacteria ENTEROBACTER CLOACAE   Final   CULTURE, BLOOD (ROUTINE X 2)     Status: Normal (Preliminary result)   Collection Time   04/09/12  3:43 PM      Component Value Range Status Comment   Specimen Description BLOOD LEFT HAND   Final    Special Requests BOTTLES DRAWN AEROBIC ONLY 2CC   Final    Culture  Setup Time 04/09/2012 22:20   Final    Culture     Final    Value:        BLOOD CULTURE RECEIVED NO  GROWTH TO DATE CULTURE WILL BE HELD FOR 5 DAYS BEFORE ISSUING A FINAL NEGATIVE REPORT   Report Status PENDING   Incomplete   CULTURE, BLOOD (ROUTINE X 2)     Status: Normal (Preliminary result)   Collection Time   04/09/12  3:52 PM      Component Value Range Status Comment   Specimen Description BLOOD LEFT HAND   Final    Special Requests BOTTLES DRAWN AEROBIC ONLY 2CC   Final    Culture  Setup Time 04/09/2012 22:20   Final    Culture     Final    Value:        BLOOD CULTURE RECEIVED NO GROWTH TO DATE CULTURE WILL BE HELD FOR 5 DAYS BEFORE ISSUING A FINAL NEGATIVE REPORT   Report Status PENDING   Incomplete   URINE CULTURE     Status: Normal   Collection Time   04/09/12  6:30 PM      Component Value Range Status Comment   Specimen Description URINE, CATHETERIZED   Final    Special Requests Normal   Final    Culture  Setup Time 04/10/2012 01:16   Final    Colony Count NO GROWTH   Final    Culture NO GROWTH   Final    Report Status 04/10/2012 FINAL   Final     Anti-infectives:  Anti-infectives     Start     Dose/Rate Route Frequency Ordered Stop   04/10/12 0600   vancomycin (VANCOCIN) 1,500 mg in sodium chloride 0.9 % 500 mL IVPB        1,500 mg 250 mL/hr over 120 Minutes Intravenous Every 12 hours 04/09/12 1553     04/09/12 1800   vancomycin (VANCOCIN) 2,500 mg in sodium chloride 0.9 % 500 mL IVPB        2,500 mg 250 mL/hr over 120 Minutes Intravenous  Once 04/09/12 1553 04/09/12 2031   04/09/12 1530  piperacillin-tazobactam (ZOSYN) IVPB 3.375 g       3.375 g 12.5 mL/hr over 240 Minutes Intravenous 3 times per day 04/09/12 1516     04/07/12 2200   ceFAZolin (ANCEF) IVPB 2 g/50 mL premix        2 g 100 mL/hr over 30 Minutes Intravenous 3 times per day 04/07/12 1818 04/08/12 1459   04/06/12 0830   ceFAZolin (ANCEF) 3 g in dextrose 5 % 50 mL IVPB        3 g 160 mL/hr over 30 Minutes Intravenous  Once 04/06/12 0809 04/07/12 1403   04/03/12 1400   ceFAZolin (ANCEF) IVPB 1 g/50  mL premix        1 g 100 mL/hr over 30 Minutes Intravenous 3 times  per day 04/03/12 0930 04/07/12 1359   04/03/12 1200   gentamicin (GARAMYCIN) 700 mg in dextrose 5 % 100 mL IVPB        700 mg 117.5 mL/hr over 60 Minutes Intravenous Every 24 hours 04/03/12 1035 04/04/12 1340   04/03/12 0600   ceFAZolin (ANCEF) IVPB 1 g/50 mL premix  Status:  Discontinued        1 g 100 mL/hr over 30 Minutes Intravenous 3 times per day 04/03/12 0550 04/03/12 0930   04/03/12 0249   polymyxin B 500,000 Units, bacitracin 50,000 Units in sodium chloride irrigation 0.9 % 500 mL irrigation  Status:  Discontinued          As needed 04/03/12 0249 04/03/12 0528   04/02/12 2315   ceFAZolin (ANCEF) IVPB 1 g/50 mL premix        1 g 100 mL/hr over 30 Minutes Intravenous  Once 04/02/12 2304 04/03/12 0125          Best Practice/Protocols:  VTE Prophylaxis: Lovenox (prophylaxtic dose) and Mechanical Continous Sedation  Consults: Treatment Team:  Harvie Junior, MD Clydene Fake, MD Budd Palmer, MD    Studies:  Dg Chest Port 1 View  04/11/2012  *RADIOLOGY REPORT*  Clinical Data: Respiratory failure  PORTABLE CHEST - 1 VIEW  Comparison: 04/10/2012  Findings: Cardiomediastinal silhouette is stable.  Stable endotracheal and NG tube position.  There is no diagnostic pneumothorax.  Patchy airspace disease in the right upper lobe again noted.  Central vascular congestion without convincing pulmonary edema.  Stable left subclavian central line position. There is new nodular patchy airspace disease left midlung and lingula.  IMPRESSION: Stable endotracheal and NG tube position.  There is no diagnostic pneumothorax.  Patchy airspace disease in the right upper lobe again noted.  Central vascular congestion without convincing pulmonary edema.  Stable left subclavian central line position. There is new nodular patchy airspace disease left midlung and lingula.  Original Report Authenticated By: Natasha Mead, M.D.   Dg  Chest Port 1 View  04/10/2012  *RADIOLOGY REPORT*  Clinical Data: Ventilator dependence.  PORTABLE CHEST - 1 VIEW  Comparison: 04/09/2012  Findings: 0614 hours.  Endotracheal tube tip is approximately 4.0 cm above the base of the carina. The NG tube passes into the stomach although the distal tip position is not included on the film.  Left subclavian central line tip projects at the innominate vein confluence.  Left chest tube has been removed in the interval. The bibasilar atelectasis persist.  There is patchy airspace disease in the right upper lung with some probable fluid in the minor fissure.  IMPRESSION: Interval removal of left chest tube without evidence for residual pneumothorax.  Slight increase in patchy airspace disease in the right upper lobe.  Original Report Authenticated By: ERIC A. MANSELL, M.D.   Events:  Subjective:    Overnight Issues:  Still lots of secretions, stable Objective:  Vital signs for last 24 hours: Temp:  [98.1 F (36.7 C)-99.7 F (37.6 C)] 98.1 F (36.7 C) (07/29 0724) Pulse Rate:  [88-110] 96  (07/29 0724) Resp:  [13-24] 23  (07/29 0724) BP: (116-153)/(55-83) 153/77 mmHg (07/29 0724) SpO2:  [97 %-100 %] 100 % (07/29 0724) Arterial Line BP: (113-182)/(60-90) 166/77 mmHg (07/29 0600) FiO2 (%):  [39.8 %-40.4 %] 40 % (07/29 0724) Weight:  [142.9 kg (315 lb 0.6 oz)] 142.9 kg (315 lb 0.6 oz) (07/29 0500)  Hemodynamic parameters for last 24 hours: CVP:  [16 mmHg-19 mmHg] 19  mmHg  Intake/Output from previous day: 07/28 0701 - 07/29 0700 In: 3717 [I.V.:1517; NG/GT:1010; IV Piggyback:1190] Out: 2535 [Urine:2535]  Intake/Output this shift:    Vent settings for last 24 hours: Vent Mode:  [-] PRVC FiO2 (%):  [39.8 %-40.4 %] 40 % Set Rate:  [24 bmp] 24 bmp Vt Set:  [600 mL] 600 mL PEEP:  [8 cmH20] 8 cmH20 Plateau Pressure:  [32 cmH20-43 cmH20] 43 cmH20  Physical Exam:  General: arousable on vent HEENT: collar Resp: few rhonchi B CV: RRR, some BLE  edema Abdomen: soft, NT, +BS Neuro: opens eyes to voice, F/C with LUE Ext: ortho dressings on pelvis and LLE, pin sites and small upper thigh wounds CDI with sutures  Results for orders placed during the hospital encounter of 04/02/12 (from the past 24 hour(s))  GLUCOSE, CAPILLARY     Status: Abnormal   Collection Time   04/12/12 11:54 AM      Component Value Range   Glucose-Capillary 105 (*) 70 - 99 mg/dL   Comment 1 Notify RN    GLUCOSE, CAPILLARY     Status: Abnormal   Collection Time   04/12/12  3:27 PM      Component Value Range   Glucose-Capillary 136 (*) 70 - 99 mg/dL   Comment 1 Notify RN    GLUCOSE, CAPILLARY     Status: Abnormal   Collection Time   04/12/12  7:47 PM      Component Value Range   Glucose-Capillary 104 (*) 70 - 99 mg/dL  GLUCOSE, CAPILLARY     Status: Abnormal   Collection Time   04/13/12 12:20 AM      Component Value Range   Glucose-Capillary 135 (*) 70 - 99 mg/dL  GLUCOSE, CAPILLARY     Status: Abnormal   Collection Time   04/13/12  4:09 AM      Component Value Range   Glucose-Capillary 120 (*) 70 - 99 mg/dL  GLUCOSE, CAPILLARY     Status: Abnormal   Collection Time   04/13/12  7:24 AM      Component Value Range   Glucose-Capillary 131 (*) 70 - 99 mg/dL   Comment 1 Documented in Chart     Comment 2 Notify RN      Assessment & Plan: Present on Admission:  **None**   LOS: 11 days   Additional comments:I reviewed the patient's new clinical lab test results.  [facility] Open frontal bone fracture - S/P repair by Dr. Phoebe Perch, neuro status improved B rib Fx, L HTX - CXR P, CT only put out 50cc so if no PTX will D/C VDRF/RUL ATX - check CXR now, gas exchange good, will decrease PEEP to 5, Lasix again once BMET back, likely will need trach ID - enterobacter PNA - change to Cipro as blood and urine cxs neg APC 2 pelvic ring Fx, open L segmental and proximal femur Fx, open L tib fib Fx, patella Fx - S/P ant plate and B SI screws, partial patellectomy by Dr.  Carola Frost FEN - tol TF, supplement K ABL anemia  VTE - filter, Lovenox Critical Care Total Time*: 38 Minutes  Violeta Gelinas, MD, MPH, FACS Pager: [phone]  04/13/2012  *Care during the described time interval was provided by me and/or other providers on the critical care team.  I have reviewed this patient's available data, including medical history, events of note, physical examination and test results as part of my evaluation.

## 2012-04-13 NOTE — Progress Notes (Signed)
UR complete 
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
## 2012-04-13 NOTE — Progress Notes (Signed)
Peripherally Inserted Central Catheter/Midline Placement  The IV Nurse has discussed with the patient and/or persons authorized to consent for the patient, the purpose of this procedure and the potential benefits and risks involved with this procedure.  The benefits include less needle sticks, lab draws from the catheter and patient may be discharged home with the catheter.  Risks include, but not limited to, infection, bleeding, blood clot (thrombus formation), and puncture of an artery; nerve damage and irregular heat beat.  Alternatives to this procedure were also discussed. Current consent from 04/08/2012 on chart.  PICC/Midline Placement Documentation        Miguel Brady, Miguel Brady 04/13/2012, 9:55 AM

## 2012-04-13 NOTE — Progress Notes (Signed)
Patient ID: Miguel Brady, male   DOB: 28-Mar-1961, 51 y.o.   MRN: [id number] BP 129/76  Pulse 105  Temp 99.1 F (37.3 C) (Core (Comment))  Resp 24  Ht 5\' 11"  (1.803 m)  Wt 142.9 kg (315 lb 0.6 oz)  BMI 43.94 kg/m2  SpO2 100% Alert, following some commands today. Moved thumb today per request. Improving neurologically Still with significant systemic problems.

## 2012-04-14 ENCOUNTER — Inpatient Hospital Stay (HOSPITAL_COMMUNITY): Payer: No Typology Code available for payment source

## 2012-04-14 LAB — GLUCOSE, CAPILLARY
Glucose-Capillary: 129 mg/dL — ABNORMAL HIGH (ref 70–99)
Glucose-Capillary: 147 mg/dL — ABNORMAL HIGH (ref 70–99)

## 2012-04-14 LAB — POCT I-STAT 3, ART BLOOD GAS (G3+)
O2 Saturation: 96 %
Patient temperature: 37.4
TCO2: 30 mmol/L (ref 0–100)

## 2012-04-14 LAB — BASIC METABOLIC PANEL
CO2: 28 mEq/L (ref 19–32)
Chloride: 108 mEq/L (ref 96–112)
Creatinine, Ser: 1.06 mg/dL (ref 0.50–1.35)

## 2012-04-14 LAB — CBC
HCT: 22.5 % — ABNORMAL LOW (ref 39.0–52.0)
MCV: 87.5 fL (ref 78.0–100.0)
RDW: 19.3 % — ABNORMAL HIGH (ref 11.5–15.5)
WBC: 15.7 10*3/uL — ABNORMAL HIGH (ref 4.0–10.5)

## 2012-04-14 MED ORDER — METOPROLOL TARTRATE 25 MG/10 ML ORAL SUSPENSION
25.0000 mg | Freq: Two times a day (BID) | ORAL | Status: DC
Start: 2012-04-14 — End: 2012-04-21
  Administered 2012-04-14 – 2012-04-19 (×11): 25 mg
  Filled 2012-04-14 (×16): qty 10

## 2012-04-14 MED ORDER — QUETIAPINE FUMARATE 25 MG PO TABS
25.0000 mg | ORAL_TABLET | Freq: Three times a day (TID) | ORAL | Status: DC
  Administered 2012-04-14 – 2012-04-15 (×4): 25 mg
  Filled 2012-04-14 (×7): qty 1

## 2012-04-14 MED ORDER — POTASSIUM CHLORIDE 10 MEQ/50ML IV SOLN
10.0000 meq | INTRAVENOUS | Status: AC
  Administered 2012-04-14 (×3): 10 meq via INTRAVENOUS
  Filled 2012-04-14: qty 150

## 2012-04-14 MED ORDER — FREE WATER
200.0000 mL | Freq: Three times a day (TID) | Status: DC
  Administered 2012-04-14 – 2012-04-19 (×15): 200 mL

## 2012-04-14 MED ORDER — CLONAZEPAM 0.5 MG PO TABS
0.2500 mg | ORAL_TABLET | Freq: Three times a day (TID) | ORAL | Status: DC
  Administered 2012-04-14 – 2012-04-15 (×4): 0.25 mg
  Filled 2012-04-14 (×4): qty 1

## 2012-04-14 MED ORDER — POTASSIUM CHLORIDE 20 MEQ/15ML (10%) PO LIQD
40.0000 meq | Freq: Every day | ORAL | Status: DC
  Administered 2012-04-14 – 2012-04-22 (×7): 40 meq via ORAL
  Filled 2012-04-14 (×9): qty 30

## 2012-04-14 MED ORDER — CLONAZEPAM 0.1 MG/ML ORAL SUSPENSION: 0.2500 mg | Freq: Three times a day (TID) | ORAL | Status: DC

## 2012-04-14 MED ORDER — FUROSEMIDE 10 MG/ML IJ SOLN
20.0000 mg | Freq: Two times a day (BID) | INTRAMUSCULAR | Status: AC
  Administered 2012-04-14 (×2): 20 mg via INTRAVENOUS
  Filled 2012-04-14 (×2): qty 2

## 2012-04-14 NOTE — Progress Notes (Signed)
Orthopedic Tech Progress Note Patient Details:  Miguel Brady 1961-06-10 [id number]  Patient ID: [name], male   DOB: 1961-08-28, 51 y.o.   MRN: [id number] Called advanced with brace order at 1530  [name], [name] 04/14/2012, 3:35 PM

## 2012-04-14 NOTE — Progress Notes (Signed)
Orthopedic Tech Progress Note Patient Details:  Miguel Brady [birth year] [id number]  Patient ID: [name], male   DOB: 1960-09-27, 51 y.o.   MRN: [id number] Brace order completed by advanced  Nikki Dom 04/14/2012, 5:56 PM

## 2012-04-14 NOTE — Progress Notes (Signed)
Patient ID: Miguel Brady, male   DOB: [birth year], 51 y.o.   MRN: [id number] Low volumes with attempts at weaning today but still periods of agitation.  Likely will need Klonopin and Seroquel increased tomorrow.  BP somewhat improved on Lopressor. Violeta Gelinas, MD, MPH, FACS Pager: [phone]

## 2012-04-14 NOTE — Progress Notes (Signed)
Patient ID: Miguel Brady, male   DOB: 1961-03-16, 51 y.o.   MRN: [id number] BP 131/78  Pulse 103  Temp 99.5 F (37.5 C) (Other (Comment))  Resp 24  Ht 5\' 11"  (1.803 m)  Wt 142.1 kg (313 lb 4.4 oz)  BMI 43.69 kg/m2  SpO2 100% Opens eyes spontaneously Follows commands with upper extremities Remains intubated, tough wean today

## 2012-04-14 NOTE — Progress Notes (Signed)
Right cuspid tooth noted in patients mouth during mouth care. Tooth placed in container in room. Will continue to monitor.

## 2012-04-14 NOTE — Clinical Social Work Note (Signed)
Clinical Social Worker spoke with patient mother over the phone to offer continued support.  Patient currently remains on the ventilator but is attempting to wean today, if not successful plan will be for trach placement later in the week.  Patient mother states that it was wearing on her own health to be at the hospital visiting all day so she has decided to come around 4-5pm and stay through 7pm to visit with patient.  She can be reached at either [phone] or [phone] during the day while she is not present at the hospital.  Patient mother stated her excitement in watching patient open eyes and recognize her in the room.  Clinical Social Worker will continue to be available for support for patient and patient mother and will facilitate patient discharge needs once appropriate.  Patient mother remains agreeable for SNF placement once patient is medically stable.  CSW to begin SNF search process once patient is removed from the ventilator.  CSW available as needed.  [address] [place], [place] [phone]
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
## 2012-04-14 NOTE — Progress Notes (Signed)
Pt discussed in hospital LOS meeting today. 

## 2012-04-15 ENCOUNTER — Inpatient Hospital Stay (HOSPITAL_COMMUNITY): Payer: No Typology Code available for payment source

## 2012-04-15 LAB — CULTURE, BLOOD (ROUTINE X 2): Culture: NO GROWTH

## 2012-04-15 LAB — MAGNESIUM: Magnesium: 2.2 mg/dL (ref 1.5–2.5)

## 2012-04-15 LAB — POCT I-STAT 3, ART BLOOD GAS (G3+)
Acid-Base Excess: 7 mmol/L — ABNORMAL HIGH (ref 0.0–2.0)
Bicarbonate: 30 meq/L — ABNORMAL HIGH (ref 20.0–24.0)
O2 Saturation: 97 %
Patient temperature: 37.6
TCO2: 31 mmol/L (ref 0–100)

## 2012-04-15 LAB — BASIC METABOLIC PANEL
BUN: 30 mg/dL — ABNORMAL HIGH (ref 6–23)
CO2: 29 mEq/L (ref 19–32)
Calcium: 8.4 mg/dL (ref 8.4–10.5)
Chloride: 106 mEq/L (ref 96–112)
Creatinine, Ser: 1.01 mg/dL (ref 0.50–1.35)
GFR calc Af Amer: >90 mL/min (ref >90)
GFR calc non Af Amer: 85 mL/min — ABNORMAL LOW (ref >90)
Glucose, Bld: 148 mg/dL — ABNORMAL HIGH (ref 70–99)
Potassium: 3.9 mEq/L (ref 3.5–5.1)
Sodium: 144 mEq/L (ref 135–145)

## 2012-04-15 LAB — GLUCOSE, CAPILLARY
Glucose-Capillary: 116 mg/dL — ABNORMAL HIGH (ref 70–99)
Glucose-Capillary: 125 mg/dL — ABNORMAL HIGH (ref 70–99)
Glucose-Capillary: 130 mg/dL — ABNORMAL HIGH (ref 70–99)

## 2012-04-15 LAB — PREPARE RBC (CROSSMATCH)

## 2012-04-15 LAB — CBC
HCT: 22.7 % — ABNORMAL LOW (ref 39.0–52.0)
Hemoglobin: 7.2 g/dL — ABNORMAL LOW (ref 13.0–17.0)
MCH: 28.1 pg (ref 26.0–34.0)
MCHC: 31.7 g/dL (ref 30.0–36.0)
MCV: 88.7 fL (ref 78.0–100.0)
Platelets: 402 10*3/uL — ABNORMAL HIGH (ref 150–400)
RBC: 2.56 MIL/uL — ABNORMAL LOW (ref 4.22–5.81)
RDW: 19.4 % — ABNORMAL HIGH (ref 11.5–15.5)
WBC: 15.5 10*3/uL — ABNORMAL HIGH (ref 4.0–10.5)

## 2012-04-15 MED ORDER — DEXMEDETOMIDINE HCL IN NACL 400 MCG/100ML IV SOLN
0.4000 ug/kg/h | INTRAVENOUS | Status: DC
  Administered 2012-04-15: 0.4 ug/kg/h via INTRAVENOUS
  Administered 2012-04-16 (×5): 0.7 ug/kg/h via INTRAVENOUS
  Administered 2012-04-16: 0.6 ug/kg/h via INTRAVENOUS
  Administered 2012-04-17 – 2012-04-19 (×10): 0.7 ug/kg/h via INTRAVENOUS
  Administered 2012-04-20: 0.5 ug/kg/h via INTRAVENOUS
  Filled 2012-04-15 (×24): qty 100

## 2012-04-15 MED ORDER — QUETIAPINE FUMARATE 50 MG PO TABS
50.0000 mg | ORAL_TABLET | Freq: Three times a day (TID) | ORAL | Status: DC
  Administered 2012-04-15 – 2012-04-16 (×3): 50 mg
  Filled 2012-04-15 (×6): qty 1

## 2012-04-15 MED ORDER — CLONAZEPAM 0.5 MG PO TABS
0.5000 mg | ORAL_TABLET | Freq: Three times a day (TID) | ORAL | Status: DC
  Administered 2012-04-15 – 2012-04-16 (×3): 0.5 mg
  Filled 2012-04-15 (×3): qty 1

## 2012-04-15 MED ORDER — FUROSEMIDE 10 MG/ML IJ SOLN
40.0000 mg | Freq: Once | INTRAMUSCULAR | Status: AC
  Administered 2012-04-15: 40 mg via INTRAVENOUS
  Filled 2012-04-15: qty 4

## 2012-04-15 MED ORDER — METHYLPREDNISOLONE SODIUM SUCC 125 MG IJ SOLR
125.0000 mg | Freq: Once | INTRAMUSCULAR | Status: AC
  Administered 2012-04-15: 125 mg via INTRAVENOUS
  Filled 2012-04-15: qty 2

## 2012-04-15 MED ORDER — DEXMEDETOMIDINE HCL IN NACL 200 MCG/50ML IV SOLN
0.4000 ug/kg/h | INTRAVENOUS | Status: DC
  Administered 2012-04-15 (×2): 0.4 ug/kg/h via INTRAVENOUS
  Administered 2012-04-15: 1.2 ug/kg/h via INTRAVENOUS
  Administered 2012-04-15: 0.4 ug/kg/h via INTRAVENOUS
  Filled 2012-04-15 (×6): qty 50

## 2012-04-15 NOTE — Progress Notes (Signed)
Nutrition Follow-up  Intervention:    Continue current EN regimen RD to follow for nutrition care plan  Assessment:   Patient remains intubated and sedated. Pivot 1.5 formula infusing at 40 ml/hr via OGT with Prosat liquid protein 30 ml 3 times daily via tube providing providing 1740 total kcals, 135 gm protein, 729 ml of free water-- tolerating well. + BM's. Patient is near extubation per MD.  Diet Order:  NPO  Meds: Scheduled Meds:   . albuterol  6 puff Inhalation Q6H  . antiseptic oral rinse  15 mL Mouth Rinse QID  . chlorhexidine  15 mL Mouth Rinse BID  . ciprofloxacin  400 mg Intravenous Q12H  . clonazePAM  0.5 mg Per Tube Q8H  . enoxaparin (LOVENOX) injection  30 mg Subcutaneous Q12H  . feeding supplement  30 mL Per Tube TID  . free water  200 mL Per Tube Q8H  . furosemide  20 mg Intravenous Q12H  . furosemide  40 mg Intravenous Once  . insulin aspart  0-9 Units Subcutaneous Q4H  . insulin glargine  5 Units Subcutaneous QHS  . labetalol  20 mg Intravenous Once  . methylPREDNISolone (SOLU-MEDROL) injection  125 mg Intravenous Once  . metoprolol tartrate  25 mg Per Tube Q12H  . multivitamin  5 mL Per Tube Daily  . pantoprazole  40 mg Oral Q1200   Or  . pantoprazole (PROTONIX) IV  40 mg Intravenous Q1200  . potassium chloride  40 mEq Oral Daily  . QUEtiapine  50 mg Per Tube Q8H  . sodium chloride  10-40 mL Intracatheter Q12H  . sodium chloride  10-40 mL Intracatheter Q12H  . DISCONTD: clonazePAM  0.25 mg Per Tube Q8H  . DISCONTD: QUEtiapine  25 mg Per Tube Q8H   Continuous Infusions:   . dexmedetomidine 0.3 mcg/kg/hr (04/15/12 1400)  . dextrose 5 % and 0.45 % NaCl with KCl 10 mEq/L 20 mL/hr (04/13/12 2002)  . feeding supplement (PIVOT 1.5 CAL) 1,000 mL (04/14/12 2300)  . fentaNYL infusion INTRAVENOUS 75 mcg/hr (04/15/12 1400)  . midazolam (VERSED) infusion Stopped (04/15/12 0915)   PRN Meds:.acetaminophen (TYLENOL) oral liquid 160 mg/5 mL, fentaNYL, midazolam,  ondansetron (ZOFRAN) IV, ondansetron, sodium chloride, sodium chloride  Labs:  CMP     Component Value Date/Time   NA 144 04/15/2012 0415   K 3.9 04/15/2012 0415   CL 106 04/15/2012 0415   CO2 29 04/15/2012 0415   GLUCOSE 148* 04/15/2012 0415   BUN 30* 04/15/2012 0415   CREATININE 1.01 04/15/2012 0415   CALCIUM 8.4 04/15/2012 0415   PROT 4.9* 04/04/2012 0400   ALBUMIN 2.1* 04/04/2012 0400   AST 118* 04/04/2012 0400   ALT 34 04/04/2012 0400   ALKPHOS 53 04/04/2012 0400   BILITOT 0.9 04/04/2012 0400   GFRNONAA 85* 04/15/2012 0415   GFRAA >90 04/15/2012 0415     Intake/Output Summary (Last 24 hours) at 04/15/12 1410 Last data filed at 04/15/12 1400  Gross per 24 hour  Intake 3118.9 ml  Output   5195 ml  Net -2076.1 ml    CBG (last 3)   Basename 04/15/12 1144 04/15/12 0753 04/14/12 2356  GLUCAP 142* 130* 132*    Weight Status:  138.5 kg (7/31) -- fluctuating  Re-estimated needs: 2600 kcals, 150-160 gm protein   Nutrition Dx: Inadequate Oral Intake, ongoing   Goal: EN to provide 60-70% of estimated calorie needs (22-25 kcals/kg ideal body weight) and >/= 90% of estimated protein needs, based on ASPEN guidelines for  permissive underfeeding in critically ill obese individuals, met  Monitor: EN regimen & tolerance, respiratory status, weight, labs, I/O's   Kirkland Hun, RD, LDN Pager #: [phone] After-Hours Pager #: [phone]
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
## 2012-04-15 NOTE — Progress Notes (Signed)
Orthopedic Tech Progress Note Patient Details:  Miguel Brady 27-Jun-1961 [id number] Brace order completed by [employer] vendor Roland Earl. Patient ID: Miguel Brady, male   DOB: 18-Nov-1960, 51 y.o.   MRN: [id number]   Miguel Brady 04/15/2012, 1:25 PM

## 2012-04-15 NOTE — Progress Notes (Signed)
Patient ID: Miguel Brady, male   DOB: [birth year], 51 y.o.   MRN: [id number] BP 141/73  Pulse 102  Temp 100.4 F (38 C) (Core (Comment))  Resp 26  Ht 5\' 11"  (1.803 m)  Wt 138.5 kg (305 lb 5.4 oz)  BMI 42.59 kg/m2  SpO2 96% Alert, follows commands Moves upper extremities Mouthing words Await decision regarding extubation.  Neurologically advancing

## 2012-04-16 ENCOUNTER — Inpatient Hospital Stay (HOSPITAL_COMMUNITY): Payer: No Typology Code available for payment source

## 2012-04-16 LAB — BASIC METABOLIC PANEL
BUN: 35 mg/dL — ABNORMAL HIGH (ref 6–23)
Creatinine, Ser: 1.01 mg/dL (ref 0.50–1.35)
GFR calc non Af Amer: 85 mL/min — ABNORMAL LOW (ref >90)
Glucose, Bld: 154 mg/dL — ABNORMAL HIGH (ref 70–99)
Potassium: 3.8 mEq/L (ref 3.5–5.1)

## 2012-04-16 LAB — CBC WITH DIFFERENTIAL/PLATELET
Eosinophils Absolute: 0.1 10*3/uL (ref 0.0–0.7)
HCT: 24.2 % — ABNORMAL LOW (ref 39.0–52.0)
Hemoglobin: 7.8 g/dL — ABNORMAL LOW (ref 13.0–17.0)
Lymphs Abs: 3 10*3/uL (ref 0.7–4.0)
MCH: 28.3 pg (ref 26.0–34.0)
MCV: 87.7 fL (ref 78.0–100.0)
Monocytes Absolute: 1.8 10*3/uL — ABNORMAL HIGH (ref 0.1–1.0)
Monocytes Relative: 11 % (ref 3–12)
Neutrophils Relative %: 71 % (ref 43–77)
RBC: 2.76 MIL/uL — ABNORMAL LOW (ref 4.22–5.81)

## 2012-04-16 LAB — TYPE AND SCREEN: Unit division: 0

## 2012-04-16 LAB — GLUCOSE, CAPILLARY
Glucose-Capillary: 145 mg/dL — ABNORMAL HIGH (ref 70–99)
Glucose-Capillary: 156 mg/dL — ABNORMAL HIGH (ref 70–99)

## 2012-04-16 MED ORDER — MIDAZOLAM HCL 2 MG/2ML IJ SOLN
2.0000 mg | INTRAMUSCULAR | Status: DC | PRN
  Administered 2012-04-16: 2 mg via INTRAVENOUS
  Administered 2012-04-16 (×2): 1 mg via INTRAVENOUS
  Administered 2012-04-17: 4 mg via INTRAVENOUS
  Administered 2012-04-17 (×3): 2 mg via INTRAVENOUS
  Administered 2012-04-17 – 2012-04-18 (×2): 4 mg via INTRAVENOUS
  Administered 2012-04-18 – 2012-04-19 (×4): 2 mg via INTRAVENOUS
  Filled 2012-04-16: qty 2
  Filled 2012-04-16: qty 4
  Filled 2012-04-16 (×2): qty 2
  Filled 2012-04-16: qty 4
  Filled 2012-04-16: qty 2
  Filled 2012-04-16: qty 4
  Filled 2012-04-16: qty 2
  Filled 2012-04-16: qty 4
  Filled 2012-04-16 (×4): qty 2

## 2012-04-16 MED ORDER — QUETIAPINE FUMARATE 100 MG PO TABS
100.0000 mg | ORAL_TABLET | Freq: Three times a day (TID) | ORAL | Status: DC
  Administered 2012-04-16 – 2012-04-22 (×13): 100 mg
  Filled 2012-04-16 (×21): qty 1

## 2012-04-16 MED ORDER — INSULIN GLARGINE 100 UNIT/ML ~~LOC~~ SOLN
10.0000 [IU] | Freq: Every day | SUBCUTANEOUS | Status: DC
  Administered 2012-04-16 – 2012-04-21 (×6): 10 [IU] via SUBCUTANEOUS

## 2012-04-16 MED ORDER — CLONAZEPAM 0.5 MG PO TABS
1.0000 mg | ORAL_TABLET | Freq: Three times a day (TID) | ORAL | Status: DC
  Administered 2012-04-16 – 2012-04-22 (×13): 1 mg
  Filled 2012-04-16 (×2): qty 1
  Filled 2012-04-16: qty 2
  Filled 2012-04-16 (×3): qty 1
  Filled 2012-04-16: qty 2
  Filled 2012-04-16 (×4): qty 1
  Filled 2012-04-16: qty 2
  Filled 2012-04-16: qty 1
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
## 2012-04-16 NOTE — Progress Notes (Signed)
Patient ID: Miguel Brady, male   DOB: [birth year], 51 y.o.   MRN: [id number] I met with patient's mother and updated her on clinical course including plan for trial of extubation over the weekend.  If that fails, trach/PEG next week.  I described the procedure and answered her questions. Violeta Gelinas, MD, MPH, FACS Pager: [phone]

## 2012-04-16 NOTE — Progress Notes (Addendum)
Patient ID: Miguel Brady, male   DOB: 02-23-1961, 51 y.o.   MRN: [id number] Follow up - Trauma Critical Care  Patient Details:    Miguel Brady is an 51 y.o. male.  Lines/tubes : AIRWAYS 7.5 mm (Active)  Secured at (cm) 23 cm 04/02/2012 12:00 AM     Airway 7.5 mm (Active)  Secured at (cm) 26 cm 04/16/2012  7:49 AM  Measured From Lips 04/16/2012  7:49 AM  Secured Location Right 04/16/2012  7:49 AM  Secured By [employer] 04/16/2012  7:49 AM  Tube Holder Repositioned Yes 04/16/2012  7:49 AM  Cuff Pressure (cm H2O) 24 cm H2O 04/15/2012  7:30 PM  Site Condition Dry 04/16/2012  7:49 AM     PICC Triple Lumen 04/13/12 Basilic (Active)  Indication for Insertion or Continuance of Line Prolonged intravenous therapies 04/15/2012  8:00 PM  Length mark (cm) 3 cm 04/13/2012 10:10 AM  Site Assessment Intact;Clean;Dry 04/15/2012  8:00 PM  Lumen #1 Status Infusing;Flushed;Capped (Central line);Blood return noted 04/15/2012  9:00 PM  Lumen #2 Status Infusing;Flushed;Blood return noted 04/15/2012  9:00 PM  Lumen #3 Status Flushed;Capped (Central line);Blood return noted 04/15/2012  9:00 PM  Dressing Type Transparent 04/15/2012  9:00 PM  Dressing Status Clean;Dry;Intact;Antimicrobial disc in place 04/15/2012  9:00 PM  Line Care Connections checked and tightened;Tubing changed;Zeroed and calibrated;Leveled 04/15/2012  9:00 PM  Dressing Intervention Dressing changed;Antimicrobial disc changed 04/15/2012 12:00 PM  Dressing Change Due 04/15/12 04/15/2012  8:00 AM     Arterial Line 04/03/12 Right Radial (Active)  Site Assessment Clean;Dry;Intact 04/15/2012  8:00 PM  Line Status Positional 04/15/2012  8:00 PM  Art Line Waveform Appropriate 04/15/2012  8:00 PM  Art Line Interventions Zeroed and calibrated;Leveled;Connections checked and tightened;Flushed per protocol 04/15/2012  8:00 PM  Color/Movement/Sensation Capillary refill less than 3 sec 04/15/2012  8:00 PM  Dressing Type Transparent 04/15/2012  8:00 PM    Dressing Status Clean;Dry;Intact 04/15/2012  8:00 PM  Interventions New dressing;Tubing changed 04/08/2012 10:36 AM  Dressing Change Due 04/11/12 04/08/2012 10:36 AM     NG/OG Tube Orogastric 16 Fr. Center mouth (Active)  Placement Verification Auscultation 04/16/2012 12:00 AM  Site Assessment Clean;Dry;Intact 04/16/2012 12:00 AM  Status Infusing tube feed 04/16/2012 12:00 AM  Drainage Appearance None 04/16/2012 12:00 AM  Gastric Residual 15 mL 04/13/2012 12:00 PM  Intake (mL) 40 mL 04/16/2012  7:00 AM  Output (mL) 30 mL 04/15/2012  8:00 PM     Urethral Catheter Non-latex;Temperature probe (Active)  Indication for Insertion or Continuance of Catheter Urinary output monitoring;Prolonged immobilization 04/16/2012 12:00 AM  Site Assessment Clean;Intact;Dry 04/16/2012 12:00 AM  Collection Container Standard drainage bag 04/16/2012 12:00 AM  Securement Method Leg strap 04/16/2012 12:00 AM  Urinary Catheter Interventions Unclamped 04/16/2012 12:00 AM  Output (mL) 200 mL 04/16/2012  6:00 AM    Microbiology/Sepsis markers: Results for orders placed during the hospital encounter of 04/02/12  MRSA PCR SCREENING     Status: Abnormal   Collection Time   04/03/12  5:45 AM      Component Value Range Status Comment   MRSA by PCR POSITIVE (*) NEGATIVE Final   CULTURE, BAL-QUANTITATIVE     Status: Normal   Collection Time   04/07/12  5:00 PM      Component Value Range Status Comment   Specimen Description BRONCHIAL ALVEOLAR LAVAGE   Final    Special Requests NONE   Final    Gram Stain     Final    Value:  NO WBC SEEN     NO SQUAMOUS EPITHELIAL CELLS SEEN     NO ORGANISMS SEEN   Colony Count 60,000 COLONIES/ML   Final    Culture ENTEROBACTER CLOACAE   Final    Report Status 04/10/2012 FINAL   Final    Organism ID, Bacteria ENTEROBACTER CLOACAE   Final   CULTURE, BLOOD (ROUTINE X 2)     Status: Normal   Collection Time   04/09/12  3:43 PM      Component Value Range Status Comment   Specimen Description BLOOD LEFT  HAND   Final    Special Requests BOTTLES DRAWN AEROBIC ONLY 2CC   Final    Culture  Setup Time 04/09/2012 22:20   Final    Culture NO GROWTH 5 DAYS   Final    Report Status 04/15/2012 FINAL   Final   CULTURE, BLOOD (ROUTINE X 2)     Status: Normal   Collection Time   04/09/12  3:52 PM      Component Value Range Status Comment   Specimen Description BLOOD LEFT HAND   Final    Special Requests BOTTLES DRAWN AEROBIC ONLY 2CC   Final    Culture  Setup Time 04/09/2012 22:20   Final    Culture NO GROWTH 5 DAYS   Final    Report Status 04/15/2012 FINAL   Final   URINE CULTURE     Status: Normal   Collection Time   04/09/12  6:30 PM      Component Value Range Status Comment   Specimen Description URINE, CATHETERIZED   Final    Special Requests Normal   Final    Culture  Setup Time 04/10/2012 01:16   Final    Colony Count NO GROWTH   Final    Culture NO GROWTH   Final    Report Status 04/10/2012 FINAL   Final     Anti-infectives:  Anti-infectives     Start     Dose/Rate Route Frequency Ordered Stop   04/13/12 0900   ciprofloxacin (CIPRO) IVPB 400 mg        400 mg 200 mL/hr over 60 Minutes Intravenous Every 12 hours 04/13/12 0832 04/19/12 0859   04/10/12 0600   vancomycin (VANCOCIN) 1,500 mg in sodium chloride 0.9 % 500 mL IVPB  Status:  Discontinued        1,500 mg 250 mL/hr over 120 Minutes Intravenous Every 12 hours 04/09/12 1553 04/13/12 0832   04/09/12 1800   vancomycin (VANCOCIN) 2,500 mg in sodium chloride 0.9 % 500 mL IVPB        2,500 mg 250 mL/hr over 120 Minutes Intravenous  Once 04/09/12 1553 04/09/12 2031   04/09/12 1530   piperacillin-tazobactam (ZOSYN) IVPB 3.375 g  Status:  Discontinued        3.375 g 12.5 mL/hr over 240 Minutes Intravenous 3 times per day 04/09/12 1516 04/13/12 0832   04/07/12 2200   ceFAZolin (ANCEF) IVPB 2 g/50 mL premix        2 g 100 mL/hr over 30 Minutes Intravenous 3 times per day 04/07/12 1818 04/08/12 1459   04/06/12 0830   ceFAZolin  (ANCEF) 3 g in dextrose 5 % 50 mL IVPB        3 g 160 mL/hr over 30 Minutes Intravenous  Once 04/06/12 0809 04/07/12 1403   04/03/12 1400   ceFAZolin (ANCEF) IVPB 1 g/50 mL premix        1 g 100 mL/hr  over 30 Minutes Intravenous 3 times per day 04/03/12 0930 04/07/12 1359   04/03/12 1200   gentamicin (GARAMYCIN) 700 mg in dextrose 5 % 100 mL IVPB        700 mg 117.5 mL/hr over 60 Minutes Intravenous Every 24 hours 04/03/12 1035 04/04/12 1340   04/03/12 0600   ceFAZolin (ANCEF) IVPB 1 g/50 mL premix  Status:  Discontinued        1 g 100 mL/hr over 30 Minutes Intravenous 3 times per day 04/03/12 0550 04/03/12 0930   04/03/12 0249   polymyxin B 500,000 Units, bacitracin 50,000 Units in sodium chloride irrigation 0.9 % 500 mL irrigation  Status:  Discontinued          As needed 04/03/12 0249 04/03/12 0528   04/02/12 2315   ceFAZolin (ANCEF) IVPB 1 g/50 mL premix        1 g 100 mL/hr over 30 Minutes Intravenous  Once 04/02/12 2304 04/03/12 0125          Best Practice/Protocols:  VTE Prophylaxis: Lovenox (prophylaxtic dose) Continous Sedation  Consults: Treatment Team:  Harvie Junior, MD Clydene Fake, MD Budd Palmer, MD    Studies:  Dg Chest Port 1 View  04/15/2012  *RADIOLOGY REPORT*  Clinical Data: Respiratory failure, pneumonia  PORTABLE CHEST - 1 VIEW  Comparison: 04/14/2012; 04/13/2012; 04/11/2012  Findings:  The examination is degraded secondary to patient body habitus and respiratory artifact.   Grossly unchanged enlarged cardiac silhouette and mediastinal contours given patient rotation. Grossly stable positioning of support apparatus.  No definite pneumothorax.  No change to minimal improved aeration of the bilateral lungs with persistent bilateral mid and lower lung predominant heterogeneous / consolidative opacities.  Pulmonary vasculature remains indistinct.  Query small left-sided pleural effusion.  Grossly unchanged bones.  IMPRESSION: 1.  Stable positioning of  support apparatus.  No pneumothorax. 2.  No change to minimal improved aeration of bilateral air space opacities worrisome for multifocal infection. 3.  Mild pulmonary edema and possible small left-sided pleural effusion is suspected.  Original Report Authenticated By: Waynard Reeds, M.D.      Events:  Subjective:    Overnight Issues:  Agitation despite dex  Objective:  Vital signs for last 24 hours: Temp:  [99.1 F (37.3 C)-100.6 F (38.1 C)] 99.1 F (37.3 C) (08/01 0700) Pulse Rate:  [71-130] 90  (08/01 0749) Resp:  [19-28] 21  (08/01 0749) BP: (89-175)/(50-100) 143/74 mmHg (08/01 0749) SpO2:  [95 %-100 %] 98 % (08/01 0749) Arterial Line BP: (70-202)/(50-98) 70/62 mmHg (08/01 0700) FiO2 (%):  [39.7 %-40.5 %] 40 % (08/01 0749) Weight:  [139 kg (306 lb 7 oz)] 139 kg (306 lb 7 oz) (08/01 0600)  Hemodynamic parameters for last 24 hours: CVP:  [2 mmHg-23 mmHg] 2 mmHg  Intake/Output from previous day: 07/31 0701 - 08/01 0700 In: 3027.6 [I.V.:752.6; Blood:375; NG/GT:1700; IV Piggyback:200] Out: 4550 [Urine:4500; Emesis/NG output:50]  Intake/Output this shift:    Vent settings for last 24 hours: Vent Mode:  [-] CPAP FiO2 (%):  [39.7 %-40.5 %] 40 % Set Rate:  [20 bmp] 20 bmp Vt Set:  [600 mL] 600 mL PEEP:  [5 cmH20] 5 cmH20 Pressure Support:  [10 cmH20-15 cmH20] 15 cmH20 Plateau Pressure:  [21 cmH20-32 cmH20] 28 cmH20  Physical Exam:  General: on vent Neuro: awake on vent, F/C briskly with LUE and RLE HEENT/Neck: collar Resp: some rhonchi partly cleared after suctioning - thick tan secretions CVS: RRR 80 GI:  soft, NT, active BS Extremities: ortho sressings, less peripheral edema  Results for orders placed during the hospital encounter of 04/02/12 (from the past 24 hour(s))  PREPARE RBC (CROSSMATCH)     Status: Normal   Collection Time   04/15/12  8:50 AM      Component Value Range   Order Confirmation ORDER PROCESSED BY BLOOD BANK    TYPE AND SCREEN     Status:  Normal   Collection Time   04/15/12  8:50 AM      Component Value Range   ABO/RH(D) A POS     Antibody Screen NEG     Sample Expiration 04/18/2012     Unit Number [id number]     Blood Component Type RED CELLS,LR     Unit division 00     Status of Unit ISSUED,FINAL     Transfusion Status OK TO TRANSFUSE     Crossmatch Result Compatible    GLUCOSE, CAPILLARY     Status: Abnormal   Collection Time   04/15/12 11:44 AM      Component Value Range   Glucose-Capillary 142 (*) 70 - 99 mg/dL   Comment 1 Documented in Chart     Comment 2 Notify RN    GLUCOSE, CAPILLARY     Status: Abnormal   Collection Time   04/15/12  4:02 PM      Component Value Range   Glucose-Capillary 175 (*) 70 - 99 mg/dL  GLUCOSE, CAPILLARY     Status: Abnormal   Collection Time   04/15/12  7:41 PM      Component Value Range   Glucose-Capillary 175 (*) 70 - 99 mg/dL  GLUCOSE, CAPILLARY     Status: Abnormal   Collection Time   04/15/12 11:57 PM      Component Value Range   Glucose-Capillary 164 (*) 70 - 99 mg/dL  GLUCOSE, CAPILLARY     Status: Abnormal   Collection Time   04/16/12  4:09 AM      Component Value Range   Glucose-Capillary 130 (*) 70 - 99 mg/dL  CBC WITH DIFFERENTIAL     Status: Abnormal   Collection Time   04/16/12  5:00 AM      Component Value Range   WBC 17.0 (*) 4.0 - 10.5 K/uL   RBC 2.76 (*) 4.22 - 5.81 MIL/uL   Hemoglobin 7.8 (*) 13.0 - 17.0 g/dL   HCT 40.9 (*) 81.1 - 91.4 %   MCV 87.7  78.0 - 100.0 fL   MCH 28.3  26.0 - 34.0 pg   MCHC 32.2  30.0 - 36.0 g/dL   RDW 78.2 (*) 95.6 - 21.3 %   Platelets 475 (*) 150 - 400 K/uL   Neutrophils Relative 71  43 - 77 %   Neutro Abs 12.0 (*) 1.7 - 7.7 K/uL   Lymphocytes Relative 18  12 - 46 %   Lymphs Abs 3.0  0.7 - 4.0 K/uL   Monocytes Relative 11  3 - 12 %   Monocytes Absolute 1.8 (*) 0.1 - 1.0 K/uL   Eosinophils Relative 1  0 - 5 %   Eosinophils Absolute 0.1  0.0 - 0.7 K/uL   Basophils Relative 0  0 - 1 %   Basophils Absolute 0.0  0.0 - 0.1  K/uL  BASIC METABOLIC PANEL     Status: Abnormal   Collection Time   04/16/12  5:00 AM      Component Value Range   Sodium 143  135 - 145 mEq/L   Potassium 3.8  3.5 - 5.1 mEq/L   Chloride 107  96 - 112 mEq/L   CO2 29  19 - 32 mEq/L   Glucose, Bld 154 (*) 70 - 99 mg/dL   BUN 35 (*) 6 - 23 mg/dL   Creatinine, Ser 1.61  0.50 - 1.35 mg/dL   Calcium 8.1 (*) 8.4 - 10.5 mg/dL   GFR calc non Af Amer 85 (*) >90 mL/min   GFR calc Af Amer >90  >90 mL/min    Assessment & Plan: Present on Admission:  **None**   LOS: 14 days   Additional comments:I reviewed the patient's new clinical lab test results. and CXR [facility] Open frontal bone fracture - S/P repair by Dr. Phoebe Perch, neuro status stable B rib Fx, L HTX VDRF/RUL ATX - advanced ETT 3CM and patient tolerated without desat, weaning now on 15/5, 10/5 volumes too low with increased RR and sat low 90s.  Will evaluate OR availability to plan trach/PEG in OR due to neck morphology. Increase Klonopin and Seroquel ID - enterobacter PNA - Cipro (ABX d7 out of 10), WBC up a bit but afebrile, re-Cx if spike or WBC trends higher APC 2 pelvic ring Fx, open L segmental and proximal femur Fx, open L tib fib Fx, patella Fx - S/P ant plate and B SI screws, partial patellectomy by Dr. Carola Frost, WB status and plans for Knee brace noted Hyperglycemia/? Hx DM - increase lantus to 10u QHS FEN - tol TF, hypernatremia better with free water ABL anemia - stable VTE - filter, Lovenox Critical Care Total Time*: 57 Minutes  Violeta Gelinas, MD, MPH, FACS Pager: [phone]  04/16/2012  *Care during the described time interval was provided by me and/or other providers on the critical care team.  I have reviewed this patient's available data, including medical history, events of note, physical examination and test results as part of my evaluation.

## 2012-04-16 NOTE — Progress Notes (Signed)
Pt reviewed in hospital LOS meeting today.   UR completed.

## 2012-04-17 ENCOUNTER — Inpatient Hospital Stay (HOSPITAL_COMMUNITY): Payer: No Typology Code available for payment source

## 2012-04-17 LAB — CBC
MCH: 27.4 pg (ref 26.0–34.0)
Platelets: 548 10*3/uL — ABNORMAL HIGH (ref 150–400)
RBC: 3.14 MIL/uL — ABNORMAL LOW (ref 4.22–5.81)
RDW: 19.5 % — ABNORMAL HIGH (ref 11.5–15.5)
WBC: 18.5 10*3/uL — ABNORMAL HIGH (ref 4.0–10.5)

## 2012-04-17 LAB — URINALYSIS, ROUTINE W REFLEX MICROSCOPIC
Bilirubin Urine: NEGATIVE
Glucose, UA: NEGATIVE mg/dL
Hgb urine dipstick: NEGATIVE
Ketones, ur: NEGATIVE mg/dL
Specific Gravity, Urine: 1.021 (ref 1.005–1.030)
pH: 5 (ref 5.0–8.0)

## 2012-04-17 LAB — GLUCOSE, CAPILLARY
Glucose-Capillary: 117 mg/dL — ABNORMAL HIGH (ref 70–99)
Glucose-Capillary: 139 mg/dL — ABNORMAL HIGH (ref 70–99)
Glucose-Capillary: 174 mg/dL — ABNORMAL HIGH (ref 70–99)

## 2012-04-17 LAB — BASIC METABOLIC PANEL
CO2: 29 mEq/L (ref 19–32)
Calcium: 8.4 mg/dL (ref 8.4–10.5)
GFR calc Af Amer: >90 mL/min (ref >90)
Sodium: 146 mEq/L — ABNORMAL HIGH (ref 135–145)

## 2012-04-17 NOTE — Clinical Social Work Note (Signed)
Clinical Social Worker spoke with patient mother over the phone.  Patient mother is on her way to the hospital to visit with patient briefly.  Patient mother states that she is managing much better and getting some rest now that she is only coming to visit briefly each day.  Patient mother understanding of MD plans to extubate patient versus trach/PEG early next week.  Clinical Social Worker will continue to follow up with patient and patient family to offer support and facilitate patient discharge needs once medically appropriate.  Macario Golds, [place] [phone]

## 2012-04-17 NOTE — Progress Notes (Signed)
Follow up - Trauma and Critical Care  Patient Details:    Miguel Brady is an 51 y.o. male.  Lines/tubes : AIRWAYS 7.5 mm (Active)  Secured at (cm) 23 cm 04/02/2012 12:00 AM     Airway 7.5 mm (Active)  Secured at (cm) 25 cm 04/17/2012  3:36 AM  Measured From Lips 04/17/2012  3:36 AM  Secured Location Center 04/17/2012  3:36 AM  Secured By [employer] 04/17/2012  3:36 AM  Tube Holder Repositioned Yes 04/17/2012  3:36 AM  Cuff Pressure (cm H2O) 22 cm H2O 04/16/2012  8:48 PM  Site Condition Dry 04/17/2012  3:36 AM     PICC Triple Lumen 04/13/12 Basilic (Active)  Indication for Insertion or Continuance of Line Prolonged intravenous therapies 04/16/2012  8:00 PM  Length mark (cm) 3 cm 04/13/2012 10:10 AM  Site Assessment Intact;Clean;Dry 04/16/2012  8:00 PM  Lumen #1 Status Infusing;Flushed;Capped (Central line);Blood return noted 04/16/2012  8:00 PM  Lumen #2 Status Infusing;Flushed;Blood return noted 04/16/2012  8:00 PM  Lumen #3 Status Flushed;Capped (Central line);Blood return noted 04/16/2012  8:00 PM  Dressing Type Transparent 04/16/2012  8:00 PM  Dressing Status Clean;Dry;Intact;Antimicrobial disc in place 04/16/2012  8:00 PM  Line Care Connections checked and tightened;Tubing changed;Zeroed and calibrated;Leveled 04/16/2012  8:00 PM  Dressing Intervention Dressing changed;Antimicrobial disc changed 04/15/2012 12:00 PM  Dressing Change Due 04/15/12 04/15/2012  8:00 AM     Arterial Line 04/03/12 Right Radial (Active)  Site Assessment Clean;Dry;Intact 04/16/2012  8:00 PM  Line Status Positional 04/16/2012  8:00 PM  Art Line Waveform Appropriate 04/16/2012  8:00 PM  Art Line Interventions Leveled 04/16/2012  8:00 AM  Color/Movement/Sensation Capillary refill less than 3 sec 04/16/2012  8:00 PM  Dressing Type Transparent 04/16/2012  8:00 PM  Dressing Status Clean;Dry;Intact 04/16/2012  8:00 PM  Interventions New dressing;Tubing changed 04/08/2012 10:36 AM  Dressing Change Due 04/11/12 04/08/2012 10:36 AM       NG/OG Tube Orogastric 16 Fr. Center mouth (Active)  Placement Verification Auscultation 04/16/2012  8:00 PM  Site Assessment Clean;Dry;Intact 04/16/2012  8:00 PM  Status Infusing tube feed 04/16/2012  8:00 PM  Drainage Appearance None 04/16/2012  8:00 PM  Gastric Residual 0 mL 04/16/2012  4:00 PM  Intake (mL) 40 mL 04/17/2012  6:00 AM  Output (mL) 30 mL 04/15/2012  8:00 PM     Urethral Catheter Non-latex;Temperature probe (Active)  Indication for Insertion or Continuance of Catheter Urinary output monitoring;Prolonged immobilization 04/16/2012  8:00 PM  Site Assessment Clean;Intact;Dry 04/16/2012  8:00 PM  Collection Container Standard drainage bag 04/16/2012  8:00 PM  Securement Method Leg strap 04/16/2012  8:00 PM  Urinary Catheter Interventions Unclamped 04/16/2012  8:00 PM  Output (mL) 225 mL 04/17/2012  6:00 AM    Microbiology/Sepsis markers: Results for orders placed during the hospital encounter of 04/02/12  MRSA PCR SCREENING     Status: Abnormal   Collection Time   04/03/12  5:45 AM      Component Value Range Status Comment   MRSA by PCR POSITIVE (*) NEGATIVE Final   CULTURE, BAL-QUANTITATIVE     Status: Normal   Collection Time   04/07/12  5:00 PM      Component Value Range Status Comment   Specimen Description BRONCHIAL ALVEOLAR LAVAGE   Final    Special Requests NONE   Final    Gram Stain     Final    Value: NO WBC SEEN     NO SQUAMOUS EPITHELIAL CELLS  SEEN     NO ORGANISMS SEEN   Colony Count 60,000 COLONIES/ML   Final    Culture ENTEROBACTER CLOACAE   Final    Report Status 04/10/2012 FINAL   Final    Organism ID, Bacteria ENTEROBACTER CLOACAE   Final   CULTURE, BLOOD (ROUTINE X 2)     Status: Normal   Collection Time   04/09/12  3:43 PM      Component Value Range Status Comment   Specimen Description BLOOD LEFT HAND   Final    Special Requests BOTTLES DRAWN AEROBIC ONLY 2CC   Final    Culture  Setup Time 04/09/2012 22:20   Final    Culture NO GROWTH 5 DAYS   Final    Report Status  04/15/2012 FINAL   Final   CULTURE, BLOOD (ROUTINE X 2)     Status: Normal   Collection Time   04/09/12  3:52 PM      Component Value Range Status Comment   Specimen Description BLOOD LEFT HAND   Final    Special Requests BOTTLES DRAWN AEROBIC ONLY 2CC   Final    Culture  Setup Time 04/09/2012 22:20   Final    Culture NO GROWTH 5 DAYS   Final    Report Status 04/15/2012 FINAL   Final   URINE CULTURE     Status: Normal   Collection Time   04/09/12  6:30 PM      Component Value Range Status Comment   Specimen Description URINE, CATHETERIZED   Final    Special Requests Normal   Final    Culture  Setup Time 04/10/2012 01:16   Final    Colony Count NO GROWTH   Final    Culture NO GROWTH   Final    Report Status 04/10/2012 FINAL   Final     Anti-infectives:  Anti-infectives     Start     Dose/Rate Route Frequency Ordered Stop   04/13/12 0900   ciprofloxacin (CIPRO) IVPB 400 mg        400 mg 200 mL/hr over 60 Minutes Intravenous Every 12 hours 04/13/12 0832 04/19/12 0859   04/10/12 0600   vancomycin (VANCOCIN) 1,500 mg in sodium chloride 0.9 % 500 mL IVPB  Status:  Discontinued        1,500 mg 250 mL/hr over 120 Minutes Intravenous Every 12 hours 04/09/12 1553 04/13/12 0832   04/09/12 1800   vancomycin (VANCOCIN) 2,500 mg in sodium chloride 0.9 % 500 mL IVPB        2,500 mg 250 mL/hr over 120 Minutes Intravenous  Once 04/09/12 1553 04/09/12 2031   04/09/12 1530   piperacillin-tazobactam (ZOSYN) IVPB 3.375 g  Status:  Discontinued        3.375 g 12.5 mL/hr over 240 Minutes Intravenous 3 times per day 04/09/12 1516 04/13/12 0832   04/07/12 2200   ceFAZolin (ANCEF) IVPB 2 g/50 mL premix        2 g 100 mL/hr over 30 Minutes Intravenous 3 times per day 04/07/12 1818 04/08/12 1459   04/06/12 0830   ceFAZolin (ANCEF) 3 g in dextrose 5 % 50 mL IVPB        3 g 160 mL/hr over 30 Minutes Intravenous  Once 04/06/12 0809 04/07/12 1403   04/03/12 1400   ceFAZolin (ANCEF) IVPB 1 g/50 mL  premix        1 g 100 mL/hr over 30 Minutes Intravenous 3 times per day 04/03/12 0930 04/07/12  1359   04/03/12 1200   gentamicin (GARAMYCIN) 700 mg in dextrose 5 % 100 mL IVPB        700 mg 117.5 mL/hr over 60 Minutes Intravenous Every 24 hours 04/03/12 1035 04/04/12 1340   04/03/12 0600   ceFAZolin (ANCEF) IVPB 1 g/50 mL premix  Status:  Discontinued        1 g 100 mL/hr over 30 Minutes Intravenous 3 times per day 04/03/12 0550 04/03/12 0930   04/03/12 0249   polymyxin B 500,000 Units, bacitracin 50,000 Units in sodium chloride irrigation 0.9 % 500 mL irrigation  Status:  Discontinued          As needed 04/03/12 0249 04/03/12 0528   04/02/12 2315   ceFAZolin (ANCEF) IVPB 1 g/50 mL premix        1 g 100 mL/hr over 30 Minutes Intravenous  Once 04/02/12 2304 04/03/12 0125          Best Practice/Protocols:  VTE Prophylaxis: Lovenox (prophylaxtic dose) and Mechanical GI Prophylaxis: Proton Pump Inhibitor Continous Sedation with Precedex and fentanyl  Consults: Treatment Team:  Harvie Junior, MD Clydene Fake, MD Budd Palmer, MD    Events:  Subjective:    Overnight Issues: Dropped BP a couple of times with metoprolol dose, but otherwise okay.  Still has lots of secretions.  Objective:  Vital signs for last 24 hours: Temp:  [99.1 F (37.3 C)-101.1 F (38.4 C)] 99.9 F (37.7 C) (08/02 0700) Pulse Rate:  [71-132] 71  (08/02 0700) Resp:  [17-32] 20  (08/02 0700) BP: (92-173)/(48-121) 125/73 mmHg (08/02 0700) SpO2:  [97 %-100 %] 100 % (08/02 0700) Arterial Line BP: (76-201)/(48-99) 134/70 mmHg (08/02 0700) FiO2 (%):  [39.8 %-100 %] 40.2 % (08/02 0700) Weight:  [140.8 kg (310 lb 6.5 oz)] 140.8 kg (310 lb 6.5 oz) (08/02 0600)  Hemodynamic parameters for last 24 hours: CVP:  [13 mmHg-16 mmHg] 14 mmHg  Intake/Output from previous day: 08/01 0701 - 08/02 0700 In: 3764.9 [I.V.:1494.9; NG/GT:1870; IV Piggyback:400] Out: 2710 [Urine:2710]  Intake/Output this  shift:    Vent settings for last 24 hours: Vent Mode:  [-] PRVC FiO2 (%):  [39.8 %-100 %] 40.2 % Set Rate:  [20 bmp] 20 bmp Vt Set:  [600 mL] 600 mL PEEP:  [5 cmH20] 5 cmH20 Pressure Support:  [15 cmH20] 15 cmH20 Plateau Pressure:  [22 cmH20-29 cmH20] 29 cmH20  Physical Exam:  General: no respiratory distress and arousable, and will follow commands by report Neuro: oriented, nonfocal exam and RASS -1 Resp: clear to auscultation bilaterally and but still has a lot of secretions CVS: regular rate and rhythm, S1, S2 normal, no murmur, click, rub or gallop GI: soft, nontender, BS WNL, no r/g and tolerating tube feedings. Extremities: edema 1+ and Much improved edema  Results for orders placed during the hospital encounter of 04/02/12 (from the past 24 hour(s))  GLUCOSE, CAPILLARY     Status: Abnormal   Collection Time   04/16/12  7:47 AM      Component Value Range   Glucose-Capillary 125 (*) 70 - 99 mg/dL   Comment 1 Notify RN     Comment 2 Documented in Chart    GLUCOSE, CAPILLARY     Status: Abnormal   Collection Time   04/16/12 12:05 PM      Component Value Range   Glucose-Capillary 145 (*) 70 - 99 mg/dL   Comment 1 Documented in Chart     Comment 2 Notify  RN    GLUCOSE, CAPILLARY     Status: Abnormal   Collection Time   04/16/12  4:07 PM      Component Value Range   Glucose-Capillary 139 (*) 70 - 99 mg/dL  GLUCOSE, CAPILLARY     Status: Abnormal   Collection Time   04/16/12  7:27 PM      Component Value Range   Glucose-Capillary 156 (*) 70 - 99 mg/dL   Comment 1 Documented in Chart     Comment 2 Notify RN    GLUCOSE, CAPILLARY     Status: Abnormal   Collection Time   04/16/12 11:59 PM      Component Value Range   Glucose-Capillary 135 (*) 70 - 99 mg/dL  GLUCOSE, CAPILLARY     Status: Abnormal   Collection Time   04/17/12  4:14 AM      Component Value Range   Glucose-Capillary 117 (*) 70 - 99 mg/dL  CBC     Status: Abnormal   Collection Time   04/17/12  5:00 AM       Component Value Range   WBC 18.5 (*) 4.0 - 10.5 K/uL   RBC 3.14 (*) 4.22 - 5.81 MIL/uL   Hemoglobin 8.6 (*) 13.0 - 17.0 g/dL   HCT 16.1 (*) 09.6 - 04.5 %   MCV 89.8  78.0 - 100.0 fL   MCH 27.4  26.0 - 34.0 pg   MCHC 30.5  30.0 - 36.0 g/dL   RDW 40.9 (*) 81.1 - 91.4 %   Platelets 548 (*) 150 - 400 K/uL  BASIC METABOLIC PANEL     Status: Abnormal   Collection Time   04/17/12  5:00 AM      Component Value Range   Sodium 146 (*) 135 - 145 mEq/L   Potassium 4.8  3.5 - 5.1 mEq/L   Chloride 109  96 - 112 mEq/L   CO2 29  19 - 32 mEq/L   Glucose, Bld 148 (*) 70 - 99 mg/dL   BUN 34 (*) 6 - 23 mg/dL   Creatinine, Ser 7.82  0.50 - 1.35 mg/dL   Calcium 8.4  8.4 - 95.6 mg/dL   GFR calc non Af Amer 83 (*) >90 mL/min   GFR calc Af Amer >90  >90 mL/min     Assessment/Plan:   NEURO  Altered Mental Status:  sedation   Plan: Wean sedation  PULM  Atelectasis/collapse (focal)   Plan: Wean to extubation if possible  CARDIO  No issues   Plan: CPM  RENAL  No issues currently   Plan: No specific issues  GI  Tolerating tube feedings,   Plan: CPM  ID  Pneumonia (ventilator-associated Enterobacter)   Plan: Continue antibiotics  HEME  Anemia 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  Look for source of fever and leukocytosis.  ENDO No specific issues   Plan: CPM  Global Issues  Doing well enough to consider extubation today.  He did wean a bit yesterday on 15/5, but secretions have still been a problem..  Neurologically he is doing okay, and he does have a good cuff leak for extubation.  Increasing WBC with fever is concerning.  May be in response to steroid bolus he got the other day, but this would not explain fever.    LOS: 15 days   Additional comments:I reviewed the patient's new clinical lab test results. cbc/bmet and I reviewed the patients  new imaging test results. cxr  Critical Care Total Time*: 30  Minutes  Nneoma Harral O 04/17/2012  *Care during the described time interval was provided by me and/or other providers on the critical care team.  I have reviewed this patient's available data, including medical history, events of note, physical examination and test results as part of my evaluation.

## 2012-04-17 NOTE — Progress Notes (Signed)
Patient ID: Miguel Brady, male   DOB: 07-Apr-1961, 52 y.o.   MRN: [id number] BP 132/72  Pulse 102  Temp 99.7 F (37.6 C) (Core (Comment))  Resp 20  Ht 5\' 11"  (1.803 m)  Wt 140.8 kg (310 lb 6.5 oz)  BMI 43.29 kg/m2  SpO2 100% Remains intubated Follows commands Poor wean today. No new recs

## 2012-04-17 NOTE — Plan of Care (Signed)
Problem: Phase I Progression Outcomes Goal: Tracheostomy by Vent Day 14 Outcome: Not Met (add Reason) Pt still intubated past 14 days on vent no tracheostomy placed

## 2012-04-18 ENCOUNTER — Inpatient Hospital Stay (HOSPITAL_COMMUNITY): Payer: No Typology Code available for payment source

## 2012-04-18 LAB — URINE CULTURE
Colony Count: NO GROWTH
Culture: NO GROWTH

## 2012-04-18 LAB — BASIC METABOLIC PANEL
BUN: 31 mg/dL — ABNORMAL HIGH (ref 6–23)
Chloride: 108 mEq/L (ref 96–112)
Creatinine, Ser: 0.95 mg/dL (ref 0.50–1.35)
GFR calc Af Amer: >90 mL/min (ref >90)
Glucose, Bld: 169 mg/dL — ABNORMAL HIGH (ref 70–99)
Potassium: 3.6 mEq/L (ref 3.5–5.1)

## 2012-04-18 LAB — GLUCOSE, CAPILLARY
Glucose-Capillary: 161 mg/dL — ABNORMAL HIGH (ref 70–99)
Glucose-Capillary: 145 mg/dL — ABNORMAL HIGH (ref 70–99)
Glucose-Capillary: 149 mg/dL — ABNORMAL HIGH (ref 70–99)

## 2012-04-18 LAB — CBC WITH DIFFERENTIAL/PLATELET
Basophils Relative: 0 % (ref 0–1)
HCT: 25.1 % — ABNORMAL LOW (ref 39.0–52.0)
Hemoglobin: 7.8 g/dL — ABNORMAL LOW (ref 13.0–17.0)
Lymphs Abs: 3.3 10*3/uL (ref 0.7–4.0)
MCH: 27.8 pg (ref 26.0–34.0)
MCHC: 31.1 g/dL (ref 30.0–36.0)
Monocytes Absolute: 1.8 10*3/uL — ABNORMAL HIGH (ref 0.1–1.0)
Monocytes Relative: 13 % — ABNORMAL HIGH (ref 3–12)
Neutro Abs: 8.1 10*3/uL — ABNORMAL HIGH (ref 1.7–7.7)
RBC: 2.81 MIL/uL — ABNORMAL LOW (ref 4.22–5.81)

## 2012-04-18 MED ORDER — PHENOL 1.4 % MT LIQD: 1.0000 | OROMUCOSAL | Status: DC | PRN

## 2012-04-18 MED ORDER — GERHARDT'S BUTT CREAM
TOPICAL_CREAM | CUTANEOUS | Status: DC | PRN
  Filled 2012-04-18: qty 1

## 2012-04-18 NOTE — Progress Notes (Signed)
Follow up - Trauma and Critical Care  Patient Details:    Miguel Brady is an 51 y.o. male.  Lines/tubes : AIRWAYS 7.5 mm (Active)  Secured at (cm) 23 cm 04/02/2012 12:00 AM     Airway 7.5 mm (Active)  Secured at (cm) 25 cm 04/17/2012  3:36 AM  Measured From Lips 04/17/2012  3:36 AM  Secured Location Center 04/17/2012  3:36 AM  Secured By [employer] 04/17/2012  3:36 AM  Tube Holder Repositioned Yes 04/17/2012  3:36 AM  Cuff Pressure (cm H2O) 22 cm H2O 04/16/2012  8:48 PM  Site Condition Dry 04/17/2012  3:36 AM     PICC Triple Lumen 04/13/12 Basilic (Active)  Indication for Insertion or Continuance of Line Prolonged intravenous therapies 04/16/2012  8:00 PM  Length mark (cm) 3 cm 04/13/2012 10:10 AM  Site Assessment Intact;Clean;Dry 04/16/2012  8:00 PM  Lumen #1 Status Infusing;Flushed;Capped (Central line);Blood return noted 04/16/2012  8:00 PM  Lumen #2 Status Infusing;Flushed;Blood return noted 04/16/2012  8:00 PM  Lumen #3 Status Flushed;Capped (Central line);Blood return noted 04/16/2012  8:00 PM  Dressing Type Transparent 04/16/2012  8:00 PM  Dressing Status Clean;Dry;Intact;Antimicrobial disc in place 04/16/2012  8:00 PM  Line Care Connections checked and tightened;Tubing changed;Zeroed and calibrated;Leveled 04/16/2012  8:00 PM  Dressing Intervention Dressing changed;Antimicrobial disc changed 04/15/2012 12:00 PM  Dressing Change Due 04/15/12 04/15/2012  8:00 AM     Arterial Line 04/03/12 Right Radial (Active)  Site Assessment Clean;Dry;Intact 04/16/2012  8:00 PM  Line Status Positional 04/16/2012  8:00 PM  Art Line Waveform Appropriate 04/16/2012  8:00 PM  Art Line Interventions Leveled 04/16/2012  8:00 AM  Color/Movement/Sensation Capillary refill less than 3 sec 04/16/2012  8:00 PM  Dressing Type Transparent 04/16/2012  8:00 PM  Dressing Status Clean;Dry;Intact 04/16/2012  8:00 PM  Interventions New dressing;Tubing changed 04/08/2012 10:36 AM  Dressing Change Due 04/11/12 04/08/2012 10:36 AM       NG/OG Tube Orogastric 16 Fr. Center mouth (Active)  Placement Verification Auscultation 04/16/2012  8:00 PM  Site Assessment Clean;Dry;Intact 04/16/2012  8:00 PM  Status Infusing tube feed 04/16/2012  8:00 PM  Drainage Appearance None 04/16/2012  8:00 PM  Gastric Residual 0 mL 04/16/2012  4:00 PM  Intake (mL) 40 mL 04/17/2012  6:00 AM  Output (mL) 30 mL 04/15/2012  8:00 PM     Urethral Catheter Non-latex;Temperature probe (Active)  Indication for Insertion or Continuance of Catheter Urinary output monitoring;Prolonged immobilization 04/16/2012  8:00 PM  Site Assessment Clean;Intact;Dry 04/16/2012  8:00 PM  Collection Container Standard drainage bag 04/16/2012  8:00 PM  Securement Method Leg strap 04/16/2012  8:00 PM  Urinary Catheter Interventions Unclamped 04/16/2012  8:00 PM  Output (mL) 225 mL 04/17/2012  6:00 AM    Microbiology/Sepsis markers: Results for orders placed during the hospital encounter of 04/02/12  MRSA PCR SCREENING     Status: Abnormal   Collection Time   04/03/12  5:45 AM      Component Value Range Status Comment   MRSA by PCR POSITIVE (*) NEGATIVE Final   CULTURE, BAL-QUANTITATIVE     Status: Normal   Collection Time   04/07/12  5:00 PM      Component Value Range Status Comment   Specimen Description BRONCHIAL ALVEOLAR LAVAGE   Final    Special Requests NONE   Final    Gram Stain     Final    Value: NO WBC SEEN     NO SQUAMOUS EPITHELIAL CELLS  SEEN     NO ORGANISMS SEEN   Colony Count 60,000 COLONIES/ML   Final    Culture ENTEROBACTER CLOACAE   Final    Report Status 04/10/2012 FINAL   Final    Organism ID, Bacteria ENTEROBACTER CLOACAE   Final   CULTURE, BLOOD (ROUTINE X 2)     Status: Normal   Collection Time   04/09/12  3:43 PM      Component Value Range Status Comment   Specimen Description BLOOD LEFT HAND   Final    Special Requests BOTTLES DRAWN AEROBIC ONLY 2CC   Final    Culture  Setup Time 04/09/2012 22:20   Final    Culture NO GROWTH 5 DAYS   Final    Report Status  04/15/2012 FINAL   Final   CULTURE, BLOOD (ROUTINE X 2)     Status: Normal   Collection Time   04/09/12  3:52 PM      Component Value Range Status Comment   Specimen Description BLOOD LEFT HAND   Final    Special Requests BOTTLES DRAWN AEROBIC ONLY 2CC   Final    Culture  Setup Time 04/09/2012 22:20   Final    Culture NO GROWTH 5 DAYS   Final    Report Status 04/15/2012 FINAL   Final   URINE CULTURE     Status: Normal   Collection Time   04/09/12  6:30 PM      Component Value Range Status Comment   Specimen Description URINE, CATHETERIZED   Final    Special Requests Normal   Final    Culture  Setup Time 04/10/2012 01:16   Final    Colony Count NO GROWTH   Final    Culture NO GROWTH   Final    Report Status 04/10/2012 FINAL   Final   CULTURE, RESPIRATORY     Status: Normal (Preliminary result)   Collection Time   04/17/12  7:49 PM      Component Value Range Status Comment   Specimen Description TRACHEAL ASPIRATE   Final    Special Requests NONE   Final    Gram Stain     Final    Value: NO WBC SEEN     RARE SQUAMOUS EPITHELIAL CELLS PRESENT     NO ORGANISMS SEEN   Culture PENDING   Incomplete    Report Status PENDING   Incomplete     Anti-infectives:  Anti-infectives     Start     Dose/Rate Route Frequency Ordered Stop   04/13/12 0900   ciprofloxacin (CIPRO) IVPB 400 mg        400 mg 200 mL/hr over 60 Minutes Intravenous Every 12 hours 04/13/12 0832 04/19/12 0859   04/10/12 0600   vancomycin (VANCOCIN) 1,500 mg in sodium chloride 0.9 % 500 mL IVPB  Status:  Discontinued        1,500 mg 250 mL/hr over 120 Minutes Intravenous Every 12 hours 04/09/12 1553 04/13/12 0832   04/09/12 1800   vancomycin (VANCOCIN) 2,500 mg in sodium chloride 0.9 % 500 mL IVPB        2,500 mg 250 mL/hr over 120 Minutes Intravenous  Once 04/09/12 1553 04/09/12 2031   04/09/12 1530   piperacillin-tazobactam (ZOSYN) IVPB 3.375 g  Status:  Discontinued        3.375 g 12.5 mL/hr over 240 Minutes  Intravenous 3 times per day 04/09/12 1516 04/13/12 0832   04/07/12 2200   ceFAZolin (ANCEF) IVPB 2  g/50 mL premix        2 g 100 mL/hr over 30 Minutes Intravenous 3 times per day 04/07/12 1818 04/08/12 1459   04/06/12 0830   ceFAZolin (ANCEF) 3 g in dextrose 5 % 50 mL IVPB        3 g 160 mL/hr over 30 Minutes Intravenous  Once 04/06/12 0809 04/07/12 1403   04/03/12 1400   ceFAZolin (ANCEF) IVPB 1 g/50 mL premix        1 g 100 mL/hr over 30 Minutes Intravenous 3 times per day 04/03/12 0930 04/07/12 1359   04/03/12 1200   gentamicin (GARAMYCIN) 700 mg in dextrose 5 % 100 mL IVPB        700 mg 117.5 mL/hr over 60 Minutes Intravenous Every 24 hours 04/03/12 1035 04/04/12 1340   04/03/12 0600   ceFAZolin (ANCEF) IVPB 1 g/50 mL premix  Status:  Discontinued        1 g 100 mL/hr over 30 Minutes Intravenous 3 times per day 04/03/12 0550 04/03/12 0930   04/03/12 0249   polymyxin B 500,000 Units, bacitracin 50,000 Units in sodium chloride irrigation 0.9 % 500 mL irrigation  Status:  Discontinued          As needed 04/03/12 0249 04/03/12 0528   04/02/12 2315   ceFAZolin (ANCEF) IVPB 1 g/50 mL premix        1 g 100 mL/hr over 30 Minutes Intravenous  Once 04/02/12 2304 04/03/12 0125          Best Practice/Protocols:  VTE Prophylaxis: Lovenox (prophylaxtic dose) and Mechanical GI Prophylaxis: Proton Pump Inhibitor Continous Sedation with Precedex and fentanyl  Consults: Treatment Team:  Harvie Junior, MD Clydene Fake, MD Budd Palmer, MD    Events:  Subjective:    Overnight Issues: Secretions minimal. No major events. Tolerating TFs. Had 2 BM. On PS now. Weaning better.  Objective:  Vital signs for last 24 hours: Temp:  [99.1 F (37.3 C)-99.9 F (37.7 C)] 99.1 F (37.3 C) (08/03 0700) Pulse Rate:  [66-120] 90  (08/03 0825) Resp:  [17-25] 25  (08/03 0825) BP: (95-144)/(50-80) 102/54 mmHg (08/03 0700) SpO2:  [98 %-100 %] 99 % (08/03 0825) Arterial Line BP:  (96-178)/(49-89) 133/78 mmHg (08/02 1700) FiO2 (%):  [39.3 %-40.3 %] 40 % (08/03 0825) Weight:  [312 lb 7.3 oz (141.73 kg)] 312 lb 7.3 oz (141.73 kg) (08/03 0300)  Hemodynamic parameters for last 24 hours: CVP:  [18 mmHg-20 mmHg] 20 mmHg  Intake/Output from previous day: 08/02 0701 - 08/03 0700 In: 3157.9 [I.V.:1047.9; NG/GT:1710; IV Piggyback:400] Out: 2826 [Urine:2825; Stool:1]  Intake/Output this shift:    Vent settings for last 24 hours: Vent Mode:  [-] PSV FiO2 (%):  [39.3 %-40.3 %] 40 % Set Rate:  [20 bmp] 20 bmp Vt Set:  [600 mL] 600 mL PEEP:  [4.7 cmH20-5 cmH20] 5 cmH20 Pressure Support:  [10 cmH20] 10 cmH20 Plateau Pressure:  [26 cmH20-30 cmH20] 26 cmH20  Physical Exam:  Intubated, some sedation (fentanyl, precedex) OE spont. FC x4 cta ant Mild tachy Soft, obese, ND, no grimace with palp No edema except for 1+ in LUE B/l boots Good cap refill  Results for orders placed during the hospital encounter of 04/02/12 (from the past 24 hour(s))  URINALYSIS, ROUTINE W REFLEX MICROSCOPIC     Status: Abnormal   Collection Time   04/17/12  9:32 AM      Component Value Range   Color, Urine AMBER (*)  YELLOW   APPearance CLOUDY (*) CLEAR   Specific Gravity, Urine 1.021  1.005 - 1.030   pH 5.0  5.0 - 8.0   Glucose, UA NEGATIVE  NEGATIVE mg/dL   Hgb urine dipstick NEGATIVE  NEGATIVE   Bilirubin Urine NEGATIVE  NEGATIVE   Ketones, ur NEGATIVE  NEGATIVE mg/dL   Protein, ur NEGATIVE  NEGATIVE mg/dL   Urobilinogen, UA 2.0 (*) 0.0 - 1.0 mg/dL   Nitrite NEGATIVE  NEGATIVE   Leukocytes, UA NEGATIVE  NEGATIVE  GLUCOSE, CAPILLARY     Status: Abnormal   Collection Time   04/17/12 11:45 AM      Component Value Range   Glucose-Capillary 153 (*) 70 - 99 mg/dL   Comment 1 Notify RN    GLUCOSE, CAPILLARY     Status: Abnormal   Collection Time   04/17/12  3:44 PM      Component Value Range   Glucose-Capillary 125 (*) 70 - 99 mg/dL   Comment 1 Notify RN    CULTURE, RESPIRATORY      Status: Normal (Preliminary result)   Collection Time   04/17/12  7:49 PM      Component Value Range   Specimen Description TRACHEAL ASPIRATE     Special Requests NONE     Gram Stain       Value: NO WBC SEEN     RARE SQUAMOUS EPITHELIAL CELLS PRESENT     NO ORGANISMS SEEN   Culture PENDING     Report Status PENDING    GLUCOSE, CAPILLARY     Status: Abnormal   Collection Time   04/17/12  7:50 PM      Component Value Range   Glucose-Capillary 174 (*) 70 - 99 mg/dL   Comment 1 Documented in Chart     Comment 2 Notify RN    GLUCOSE, CAPILLARY     Status: Abnormal   Collection Time   04/18/12 12:06 AM      Component Value Range   Glucose-Capillary 115 (*) 70 - 99 mg/dL  CBC WITH DIFFERENTIAL     Status: Abnormal   Collection Time   04/18/12  4:20 AM      Component Value Range   WBC 13.7 (*) 4.0 - 10.5 K/uL   RBC 2.81 (*) 4.22 - 5.81 MIL/uL   Hemoglobin 7.8 (*) 13.0 - 17.0 g/dL   HCT 45.4 (*) 09.8 - 11.9 %   MCV 89.3  78.0 - 100.0 fL   MCH 27.8  26.0 - 34.0 pg   MCHC 31.1  30.0 - 36.0 g/dL   RDW 14.7 (*) 82.9 - 56.2 %   Platelets 536 (*) 150 - 400 K/uL   Neutrophils Relative 59  43 - 77 %   Neutro Abs 8.1 (*) 1.7 - 7.7 K/uL   Lymphocytes Relative 24  12 - 46 %   Lymphs Abs 3.3  0.7 - 4.0 K/uL   Monocytes Relative 13 (*) 3 - 12 %   Monocytes Absolute 1.8 (*) 0.1 - 1.0 K/uL   Eosinophils Relative 3  0 - 5 %   Eosinophils Absolute 0.5  0.0 - 0.7 K/uL   Basophils Relative 0  0 - 1 %   Basophils Absolute 0.0  0.0 - 0.1 K/uL  BASIC METABOLIC PANEL     Status: Abnormal   Collection Time   04/18/12  4:20 AM      Component Value Range   Sodium 142  135 - 145 mEq/L   Potassium 3.6  3.5 - 5.1 mEq/L   Chloride 108  96 - 112 mEq/L   CO2 28  19 - 32 mEq/L   Glucose, Bld 169 (*) 70 - 99 mg/dL   BUN 31 (*) 6 - 23 mg/dL   Creatinine, Ser 1.61  0.50 - 1.35 mg/dL   Calcium 8.1 (*) 8.4 - 10.5 mg/dL   GFR calc non Af Amer >90  >90 mL/min   GFR calc Af Amer >90  >90 mL/min  GLUCOSE, CAPILLARY      Status: Abnormal   Collection Time   04/18/12  4:20 AM      Component Value Range   Glucose-Capillary 161 (*) 70 - 99 mg/dL  GLUCOSE, CAPILLARY     Status: Abnormal   Collection Time   04/18/12  7:44 AM      Component Value Range   Glucose-Capillary 149 (*) 70 - 99 mg/dL   Comment 1 Notify RN       Assessment/Plan:    LOS: 16 days  [facility]  Open frontal bone fracture - S/P repair by Dr. Phoebe Perch, neuro status stable  B rib Fx, L HTX  VDRF/RUL ATX - cont to wean as tolerated. PS during day, rest on vent at night. Hopeful extubation in am  ID - enterobacter PNA - Cipro , WBC down now, re-Cx if spike or WBC trends higher  APC 2 pelvic ring Fx, open L segmental and proximal femur Fx, open L tib fib Fx, patella Fx - S/P ant plate and B SI screws, partial patellectomy by Dr. Carola Frost, WB status and plans for Knee brace noted  Hyperglycemia/? Hx DM -cont lantus. BS ok  FEN - tol TF,  ABL anemia - hgb down slightly. Repeat in AM VTE - filter, Lovenox  Additional comments:I reviewed the patient's new clinical lab test results. cbc/bmet and I reviewed the patients new imaging test results. cxr  Critical Care Total Time*: 20 Minutes  Atilano Ina 04/18/2012  *Care during the described time interval was provided by me and/or other providers on the critical care team.  I have reviewed this patient's available data, including medical history, events of note, physical examination and test results as part of my evaluation.

## 2012-04-19 ENCOUNTER — Inpatient Hospital Stay (HOSPITAL_COMMUNITY): Payer: No Typology Code available for payment source

## 2012-04-19 LAB — BASIC METABOLIC PANEL
Calcium: 7.9 mg/dL — ABNORMAL LOW (ref 8.4–10.5)
GFR calc Af Amer: >90 mL/min (ref >90)
GFR calc non Af Amer: >90 mL/min (ref >90)
Glucose, Bld: 259 mg/dL — ABNORMAL HIGH (ref 70–99)
Potassium: 4.2 mEq/L (ref 3.5–5.1)
Sodium: 140 mEq/L (ref 135–145)

## 2012-04-19 LAB — CBC WITH DIFFERENTIAL/PLATELET
Basophils Relative: 0 % (ref 0–1)
Eosinophils Absolute: 0.4 10*3/uL (ref 0.0–0.7)
Hemoglobin: 7.7 g/dL — ABNORMAL LOW (ref 13.0–17.0)
Lymphs Abs: 2.9 10*3/uL (ref 0.7–4.0)
MCH: 28.1 pg (ref 26.0–34.0)
Monocytes Relative: 12 % (ref 3–12)
Neutro Abs: 9.3 10*3/uL — ABNORMAL HIGH (ref 1.7–7.7)
Neutrophils Relative %: 65 % (ref 43–77)
Platelets: 510 10*3/uL — ABNORMAL HIGH (ref 150–400)
RBC: 2.74 MIL/uL — ABNORMAL LOW (ref 4.22–5.81)
WBC: 14.3 10*3/uL — ABNORMAL HIGH (ref 4.0–10.5)

## 2012-04-19 LAB — GLUCOSE, CAPILLARY
Glucose-Capillary: 121 mg/dL — ABNORMAL HIGH (ref 70–99)
Glucose-Capillary: 135 mg/dL — ABNORMAL HIGH (ref 70–99)

## 2012-04-19 MED ORDER — METHYLPREDNISOLONE SODIUM SUCC 125 MG IJ SOLR
80.0000 mg | Freq: Two times a day (BID) | INTRAMUSCULAR | Status: DC
  Administered 2012-04-19 – 2012-04-21 (×5): 80 mg via INTRAVENOUS
  Filled 2012-04-19: qty 1.28
  Filled 2012-04-19: qty 2
  Filled 2012-04-19 (×3): qty 1.28

## 2012-04-19 MED ORDER — ALBUTEROL SULFATE (5 MG/ML) 0.5% IN NEBU
2.5000 mg | INHALATION_SOLUTION | Freq: Four times a day (QID) | RESPIRATORY_TRACT | Status: DC
  Administered 2012-04-19 – 2012-04-21 (×9): 2.5 mg via RESPIRATORY_TRACT
  Filled 2012-04-19 (×9): qty 0.5

## 2012-04-19 MED ORDER — RACEPINEPHRINE HCL 2.25 % IN NEBU
0.5000 mL | INHALATION_SOLUTION | RESPIRATORY_TRACT | Status: DC | PRN
  Administered 2012-04-19: 0.5 mL via RESPIRATORY_TRACT

## 2012-04-19 MED ORDER — ALBUTEROL SULFATE (5 MG/ML) 0.5% IN NEBU
INHALATION_SOLUTION | RESPIRATORY_TRACT | Status: AC
  Filled 2012-04-19: qty 0.5

## 2012-04-19 MED ORDER — METOPROLOL TARTRATE 1 MG/ML IV SOLN
10.0000 mg | Freq: Four times a day (QID) | INTRAVENOUS | Status: DC
  Administered 2012-04-20 – 2012-04-21 (×6): 10 mg via INTRAVENOUS
  Filled 2012-04-19 (×10): qty 10

## 2012-04-19 MED ORDER — METHYLPREDNISOLONE SODIUM SUCC 125 MG IJ SOLR
INTRAMUSCULAR | Status: AC
  Filled 2012-04-19: qty 2

## 2012-04-19 MED ORDER — FUROSEMIDE 10 MG/ML IJ SOLN
20.0000 mg | Freq: Once | INTRAMUSCULAR | Status: AC
  Administered 2012-04-19: 20 mg via INTRAVENOUS

## 2012-04-19 MED ORDER — RACEPINEPHRINE HCL 2.25 % IN NEBU
INHALATION_SOLUTION | RESPIRATORY_TRACT | Status: AC
  Filled 2012-04-19: qty 0.5

## 2012-04-19 MED ORDER — ALBUTEROL SULFATE (5 MG/ML) 0.5% IN NEBU
2.5000 mg | INHALATION_SOLUTION | RESPIRATORY_TRACT | Status: DC | PRN
Start: 2012-04-19 — End: 2012-04-22
  Administered 2012-04-19: 2.5 mg via RESPIRATORY_TRACT

## 2012-04-19 NOTE — Progress Notes (Signed)
Follow up - Trauma and Critical Care  Patient Details:    Miguel Brady is an 51 y.o. male.  Lines/tubes : AIRWAYS 7.5 mm (Active)  Secured at (cm) 23 cm 04/02/2012 12:00 AM     Airway 7.5 mm (Active)  Secured at (cm) 25 cm 04/19/2012  3:42 AM  Measured From Lips 04/19/2012  3:42 AM  Secured Location Center 04/19/2012  3:42 AM  Secured By [employer] 04/19/2012  3:42 AM  Tube Holder Repositioned Yes 04/19/2012  3:42 AM  Cuff Pressure (cm H2O) 22 cm H2O 04/18/2012  7:44 PM  Site Condition Dry 04/19/2012  3:42 AM     PICC Triple Lumen 04/13/12 Basilic (Active)  Indication for Insertion or Continuance of Line Prolonged intravenous therapies 04/18/2012  8:00 PM  Length mark (cm) 3 cm 04/13/2012 10:10 AM  Site Assessment Intact;Clean;Dry 04/18/2012  8:00 PM  Lumen #1 Status Infusing;Flushed;Capped (Central line);Blood return noted 04/18/2012  8:00 PM  Lumen #2 Status Infusing;Flushed;Blood return noted 04/18/2012  8:00 PM  Lumen #3 Status Flushed;Capped (Central line);Blood return noted 04/18/2012  8:00 PM  Dressing Type Transparent 04/18/2012  8:00 PM  Dressing Status Clean;Dry;Intact;Antimicrobial disc in place 04/18/2012  8:00 PM  Line Care Connections checked and tightened;Tubing changed;Zeroed and calibrated;Leveled 04/18/2012  8:00 PM  Dressing Intervention Dressing changed;Antimicrobial disc changed 04/15/2012 12:00 PM  Dressing Change Due 04/15/12 04/15/2012  8:00 AM     NG/OG Tube Orogastric 16 Fr. Center mouth (Active)  Placement Verification Auscultation 04/18/2012 10:00 PM  Site Assessment Clean;Dry;Intact 04/18/2012 10:00 PM  Status Infusing tube feed 04/18/2012 10:00 PM  Drainage Appearance None 04/18/2012 10:00 PM  Gastric Residual 10 mL 04/19/2012 12:00 AM  Intake (mL) 40 mL 04/19/2012  6:00 AM  Output (mL) 30 mL 04/15/2012  8:00 PM     Rectal Tube/Pouch (Active)  Output (mL) 100 mL 04/19/2012  6:00 AM  Intake (mL) 40 mL 04/18/2012  3:00 PM     Urethral Catheter Non-latex;Temperature probe  (Active)  Indication for Insertion or Continuance of Catheter Urinary output monitoring;Prolonged immobilization 04/18/2012 10:00 PM  Site Assessment Clean;Intact;Dry 04/18/2012 10:00 PM  Collection Container Standard drainage bag 04/18/2012 10:00 PM  Securement Method Leg strap 04/18/2012 10:00 PM  Urinary Catheter Interventions Unclamped 04/18/2012 10:00 PM  Output (mL) 200 mL 04/19/2012  6:00 AM    Microbiology/Sepsis markers: Results for orders placed during the hospital encounter of 04/02/12  MRSA PCR SCREENING     Status: Abnormal   Collection Time   04/03/12  5:45 AM      Component Value Range Status Comment   MRSA by PCR POSITIVE (*) NEGATIVE Final   CULTURE, BAL-QUANTITATIVE     Status: Normal   Collection Time   04/07/12  5:00 PM      Component Value Range Status Comment   Specimen Description BRONCHIAL ALVEOLAR LAVAGE   Final    Special Requests NONE   Final    Gram Stain     Final    Value: NO WBC SEEN     NO SQUAMOUS EPITHELIAL CELLS SEEN     NO ORGANISMS SEEN   Colony Count 60,000 COLONIES/ML   Final    Culture ENTEROBACTER CLOACAE   Final    Report Status 04/10/2012 FINAL   Final    Organism ID, Bacteria ENTEROBACTER CLOACAE   Final   CULTURE, BLOOD (ROUTINE X 2)     Status: Normal   Collection Time   04/09/12  3:43 PM  Component Value Range Status Comment   Specimen Description BLOOD LEFT HAND   Final    Special Requests BOTTLES DRAWN AEROBIC ONLY 2CC   Final    Culture  Setup Time 04/09/2012 22:20   Final    Culture NO GROWTH 5 DAYS   Final    Report Status 04/15/2012 FINAL   Final   CULTURE, BLOOD (ROUTINE X 2)     Status: Normal   Collection Time   04/09/12  3:52 PM      Component Value Range Status Comment   Specimen Description BLOOD LEFT HAND   Final    Special Requests BOTTLES DRAWN AEROBIC ONLY 2CC   Final    Culture  Setup Time 04/09/2012 22:20   Final    Culture NO GROWTH 5 DAYS   Final    Report Status 04/15/2012 FINAL   Final   URINE CULTURE     Status:  Normal   Collection Time   04/09/12  6:30 PM      Component Value Range Status Comment   Specimen Description URINE, CATHETERIZED   Final    Special Requests Normal   Final    Culture  Setup Time 04/10/2012 01:16   Final    Colony Count NO GROWTH   Final    Culture NO GROWTH   Final    Report Status 04/10/2012 FINAL   Final   CULTURE, BLOOD (ROUTINE X 2)     Status: Normal (Preliminary result)   Collection Time   04/17/12  8:45 AM      Component Value Range Status Comment   Specimen Description BLOOD LEFT ARM   Final    Special Requests BOTTLES DRAWN AEROBIC ONLY 4CC   Final    Culture  Setup Time 04/17/2012 16:52   Final    Culture     Final    Value:        BLOOD CULTURE RECEIVED NO GROWTH TO DATE CULTURE WILL BE HELD FOR 5 DAYS BEFORE ISSUING A FINAL NEGATIVE REPORT   Report Status PENDING   Incomplete   CULTURE, BLOOD (ROUTINE X 2)     Status: Normal (Preliminary result)   Collection Time   04/17/12  9:05 AM      Component Value Range Status Comment   Specimen Description BLOOD LEFT ARM   Final    Special Requests BOTTLES DRAWN AEROBIC ONLY 5CC   Final    Culture  Setup Time 04/17/2012 16:52   Final    Culture     Final    Value:        BLOOD CULTURE RECEIVED NO GROWTH TO DATE CULTURE WILL BE HELD FOR 5 DAYS BEFORE ISSUING A FINAL NEGATIVE REPORT   Report Status PENDING   Incomplete   URINE CULTURE     Status: Normal   Collection Time   04/17/12  9:32 AM      Component Value Range Status Comment   Specimen Description URINE, CATHETERIZED   Final    Special Requests NONE   Final    Culture  Setup Time 04/17/2012 10:53   Final    Colony Count NO GROWTH   Final    Culture NO GROWTH   Final    Report Status 04/18/2012 FINAL   Final   CULTURE, RESPIRATORY     Status: Normal (Preliminary result)   Collection Time   04/17/12  7:49 PM      Component Value Range Status Comment   Specimen  Description TRACHEAL ASPIRATE   Final    Special Requests NONE   Final    Gram Stain     Final     Value: NO WBC SEEN     RARE SQUAMOUS EPITHELIAL CELLS PRESENT     NO ORGANISMS SEEN   Culture PENDING   Incomplete    Report Status PENDING   Incomplete     Anti-infectives:  Anti-infectives     Start     Dose/Rate Route Frequency Ordered Stop   04/13/12 0900   ciprofloxacin (CIPRO) IVPB 400 mg        400 mg 200 mL/hr over 60 Minutes Intravenous Every 12 hours 04/13/12 0832 04/18/12 2143   04/10/12 0600   vancomycin (VANCOCIN) 1,500 mg in sodium chloride 0.9 % 500 mL IVPB  Status:  Discontinued        1,500 mg 250 mL/hr over 120 Minutes Intravenous Every 12 hours 04/09/12 1553 04/13/12 0832   04/09/12 1800   vancomycin (VANCOCIN) 2,500 mg in sodium chloride 0.9 % 500 mL IVPB        2,500 mg 250 mL/hr over 120 Minutes Intravenous  Once 04/09/12 1553 04/09/12 2031   04/09/12 1530   piperacillin-tazobactam (ZOSYN) IVPB 3.375 g  Status:  Discontinued        3.375 g 12.5 mL/hr over 240 Minutes Intravenous 3 times per day 04/09/12 1516 04/13/12 0832   04/07/12 2200   ceFAZolin (ANCEF) IVPB 2 g/50 mL premix        2 g 100 mL/hr over 30 Minutes Intravenous 3 times per day 04/07/12 1818 04/08/12 1459   04/06/12 0830   ceFAZolin (ANCEF) 3 g in dextrose 5 % 50 mL IVPB        3 g 160 mL/hr over 30 Minutes Intravenous  Once 04/06/12 0809 04/07/12 1403   04/03/12 1400   ceFAZolin (ANCEF) IVPB 1 g/50 mL premix        1 g 100 mL/hr over 30 Minutes Intravenous 3 times per day 04/03/12 0930 04/07/12 1359   04/03/12 1200   gentamicin (GARAMYCIN) 700 mg in dextrose 5 % 100 mL IVPB        700 mg 117.5 mL/hr over 60 Minutes Intravenous Every 24 hours 04/03/12 1035 04/04/12 1340   04/03/12 0600   ceFAZolin (ANCEF) IVPB 1 g/50 mL premix  Status:  Discontinued        1 g 100 mL/hr over 30 Minutes Intravenous 3 times per day 04/03/12 0550 04/03/12 0930   04/03/12 0249   polymyxin B 500,000 Units, bacitracin 50,000 Units in sodium chloride irrigation 0.9 % 500 mL irrigation  Status:  Discontinued           As needed 04/03/12 0249 04/03/12 0528   04/02/12 2315   ceFAZolin (ANCEF) IVPB 1 g/50 mL premix        1 g 100 mL/hr over 30 Minutes Intravenous  Once 04/02/12 2304 04/03/12 0125          Best Practice/Protocols:  VTE Prophylaxis: Lovenox (prophylaxtic dose) and Mechanical GI Prophylaxis: Proton Pump Inhibitor Continous Sedation  Consults: Treatment Team:  Harvie Junior, MD Clydene Fake, MD Budd Palmer, MD    Events:  Subjective:    Overnight Issues: Patient looks stable.  Want to consider weaning today.  Objective:  Vital signs for last 24 hours: Temp:  [98.9 F (37.2 C)-100.8 F (38.2 C)] 98.9 F (37.2 C) (08/04 0316) Pulse Rate:  [69-130] 69  (08/04 0700)  Resp:  [17-29] 20  (08/04 0700) BP: (70-158)/(44-90) 105/65 mmHg (08/04 0700) SpO2:  [87 %-100 %] 100 % (08/04 0700) FiO2 (%):  [39.7 %-44.8 %] 40 % (08/04 0700) Weight:  [139.7 kg (307 lb 15.7 oz)] 139.7 kg (307 lb 15.7 oz) (08/04 0600)  Hemodynamic parameters for last 24 hours: CVP:  [15 mmHg-16 mmHg] 16 mmHg  Intake/Output from previous day: 08/03 0701 - 08/04 0700 In: 1220 [I.V.:300; NG/GT:880] Out: 2635 [Urine:2435; Stool:200]  Intake/Output this shift:    Vent settings for last 24 hours: Vent Mode:  [-] PRVC FiO2 (%):  [39.7 %-44.8 %] 40 % Set Rate:  [20 bmp] 20 bmp Vt Set:  [600 mL] 600 mL PEEP:  [5 cmH20] 5 cmH20 Pressure Support:  [10 cmH20] 10 cmH20 Plateau Pressure:  [26 cmH20-32 cmH20] 26 cmH20  Physical Exam:  General: no respiratory distress and arousable Neuro: nonfocal exam and RASS -1 Resp: clear to auscultation bilaterally and CXR shows more bibasilar atelectasis, possible increased effusion. GI: soft, nontender, BS WNL, no r/g and diarrhea with FlexiSeal in place Extremities: edema 1+ and Improved  Results for orders placed during the hospital encounter of 04/02/12 (from the past 24 hour(s))  GLUCOSE, CAPILLARY     Status: Abnormal   Collection Time   04/18/12   7:44 AM      Component Value Range   Glucose-Capillary 149 (*) 70 - 99 mg/dL   Comment 1 Notify RN    GLUCOSE, CAPILLARY     Status: Abnormal   Collection Time   04/18/12 12:36 PM      Component Value Range   Glucose-Capillary 145 (*) 70 - 99 mg/dL   Comment 1 Notify RN    GLUCOSE, CAPILLARY     Status: Abnormal   Collection Time   04/18/12  4:09 PM      Component Value Range   Glucose-Capillary 149 (*) 70 - 99 mg/dL   Comment 1 Notify RN    GLUCOSE, CAPILLARY     Status: Abnormal   Collection Time   04/18/12  7:39 PM      Component Value Range   Glucose-Capillary 137 (*) 70 - 99 mg/dL   Comment 1 Documented in Chart     Comment 2 Notify RN    GLUCOSE, CAPILLARY     Status: Abnormal   Collection Time   04/18/12 11:31 PM      Component Value Range   Glucose-Capillary 120 (*) 70 - 99 mg/dL   Comment 1 Documented in Chart     Comment 2 Notify RN    GLUCOSE, CAPILLARY     Status: Abnormal   Collection Time   04/19/12  3:15 AM      Component Value Range   Glucose-Capillary 129 (*) 70 - 99 mg/dL   Comment 1 Documented in Chart     Comment 2 Notify RN    BASIC METABOLIC PANEL     Status: Abnormal   Collection Time   04/19/12  3:39 AM      Component Value Range   Sodium 140  135 - 145 mEq/L   Potassium 4.2  3.5 - 5.1 mEq/L   Chloride 107  96 - 112 mEq/L   CO2 27  19 - 32 mEq/L   Glucose, Bld 259 (*) 70 - 99 mg/dL   BUN 26 (*) 6 - 23 mg/dL   Creatinine, Ser 1.61  0.50 - 1.35 mg/dL   Calcium 7.9 (*) 8.4 - 10.5 mg/dL   GFR  calc non Af Amer >90  >90 mL/min   GFR calc Af Amer >90  >90 mL/min  CBC WITH DIFFERENTIAL     Status: Abnormal   Collection Time   04/19/12  3:39 AM      Component Value Range   WBC 14.3 (*) 4.0 - 10.5 K/uL   RBC 2.74 (*) 4.22 - 5.81 MIL/uL   Hemoglobin 7.7 (*) 13.0 - 17.0 g/dL   HCT 45.4 (*) 09.8 - 11.9 %   MCV 90.1  78.0 - 100.0 fL   MCH 28.1  26.0 - 34.0 pg   MCHC 31.2  30.0 - 36.0 g/dL   RDW 14.7 (*) 82.9 - 56.2 %   Platelets 510 (*) 150 - 400 K/uL    Neutrophils Relative 65  43 - 77 %   Neutro Abs 9.3 (*) 1.7 - 7.7 K/uL   Lymphocytes Relative 20  12 - 46 %   Lymphs Abs 2.9  0.7 - 4.0 K/uL   Monocytes Relative 12  3 - 12 %   Monocytes Absolute 1.7 (*) 0.1 - 1.0 K/uL   Eosinophils Relative 3  0 - 5 %   Eosinophils Absolute 0.4  0.0 - 0.7 K/uL   Basophils Relative 0  0 - 1 %   Basophils Absolute 0.1  0.0 - 0.1 K/uL     Assessment/Plan:   NEURO  Altered Mental Status:  sedation and but gets agitated easily.   Plan: CPM, possibly wean  PULM  Atelectasis/collapse (focal and bibasilar effusion) Last pulmonary culture was negative so far   Plan: Continue to wean for possible extubation.  CARDIO  No issues   Plan: CPM  RENAL  Urine output is good.   Plan: Will give an additional Lasix 20mg  IVP  GI  diarrhea Nutritional Deficiency On tube feedings   Plan: CPM until time for extubation.  ID  No infectious source.   Plan: No currenct antibiotics  HEME  Anemia anemia of chronic disease and anemia of critical illness) Leukocytosis (neutrophilia)   Plan: No specific changed  ENDO No issues   Plan: CPM  Global Issues  The patient weaned on 10/5 yesterday for 6 hours and could have gone longer.  Will try to wean again today for shorted time, extubate later this morning.  Will support with BiPAP is necessary.  Had good cuff leak Friday.  Will maintain sedation until extubation, including the Precedex.  Because of the diarrhea will check C. Diff.  Re-order restraints.    LOS: 17 days   Additional comments:I reviewed the patient's new clinical lab test results. cbc/bmet and I reviewed the patients new imaging test results. cxr  Critical Care Total Time*: 30 Minutes  Arthur Aydelotte O 04/19/2012  *Care during the described time interval was provided by me and/or other providers on the critical care team.  I have reviewed this patient's available data, including medical history, events of note, physical examination and test results as  part of my evaluation.

## 2012-04-19 NOTE — Progress Notes (Signed)
After extubation, pt's RR was in the 30's to 40's and O2 sats ranged anywhere from 91-97 on 5lpm [place]. PRN neb treatment ordered by RT to try and decrease WOB. Pt tolerating well. RT will continue to monitor.

## 2012-04-19 NOTE — Progress Notes (Signed)
Some Stridor was heard briefly after extubation. RT ordered Racemic Epi Q2 PRN. Stridor has resolved some after treatment given. Vitals have remained stable except for RR, and pt is using some accessory muscles. Pt able to cough up small amount of secretions. RT will continue to monitor.

## 2012-04-19 NOTE — Progress Notes (Signed)
RT went in pt room to check bipap. RT found pt peep setting to be on 11.0. There were no MD orders to change peep from 7.0 to 11.0. RT changed pt peep back to 7.0. RN notified. RT will continue to monitor.

## 2012-04-19 NOTE — Procedures (Signed)
Extubation Procedure Note  Patient Details:   Name: Miguel Brady DOB: [birth year] MRN: [id number]   Airway Documentation:  AIRWAYS 7.5 mm (Active)  Secured at (cm) 23 cm 04/02/2012 12:00 AM    Evaluation  O2 sats: stable throughout Complications: No apparent complications Patient did tolerate procedure well. Bilateral Breath Sounds: Rhonchi;Diminished Suctioning: Oral;Airway Yes  Pt tolerated CPAP wean well. Order from MD okay to extubate. Pt positive for cuff leak, and was extubated to 5lpm [place]. Pt tolerated extubation without incidence. RT will continue to monitor.   Parke Poisson 04/19/2012, 10:41 AM

## 2012-04-20 ENCOUNTER — Inpatient Hospital Stay (HOSPITAL_COMMUNITY): Payer: No Typology Code available for payment source

## 2012-04-20 LAB — CULTURE, RESPIRATORY W GRAM STAIN

## 2012-04-20 LAB — POCT I-STAT 3, ART BLOOD GAS (G3+)
Bicarbonate: 29.6 meq/L — ABNORMAL HIGH (ref 20.0–24.0)
O2 Saturation: 92 %
TCO2: 31 mmol/L (ref 0–100)
pCO2 arterial: 48.9 mmHg — ABNORMAL HIGH (ref 35.0–45.0)
pH, Arterial: 7.39 (ref 7.350–7.450)
pO2, Arterial: 66 mmHg — ABNORMAL LOW (ref 80.0–100.0)

## 2012-04-20 LAB — CBC WITH DIFFERENTIAL/PLATELET
Eosinophils Absolute: 0 10*3/uL (ref 0.0–0.7)
Eosinophils Relative: 0 % (ref 0–5)
HCT: 29.3 % — ABNORMAL LOW (ref 39.0–52.0)
Lymphocytes Relative: 9 % — ABNORMAL LOW (ref 12–46)
Lymphs Abs: 1.2 10*3/uL (ref 0.7–4.0)
MCH: 27.7 pg (ref 26.0–34.0)
MCV: 88.3 fL (ref 78.0–100.0)
Monocytes Absolute: 0.4 10*3/uL (ref 0.1–1.0)
Platelets: 621 10*3/uL — ABNORMAL HIGH (ref 150–400)
RBC: 3.32 MIL/uL — ABNORMAL LOW (ref 4.22–5.81)
RDW: 17.7 % — ABNORMAL HIGH (ref 11.5–15.5)
WBC: 14.3 10*3/uL — ABNORMAL HIGH (ref 4.0–10.5)

## 2012-04-20 LAB — GLUCOSE, CAPILLARY
Glucose-Capillary: 116 mg/dL — ABNORMAL HIGH (ref 70–99)
Glucose-Capillary: 124 mg/dL — ABNORMAL HIGH (ref 70–99)

## 2012-04-20 LAB — BASIC METABOLIC PANEL
CO2: 29 mEq/L (ref 19–32)
Calcium: 8.3 mg/dL — ABNORMAL LOW (ref 8.4–10.5)
Creatinine, Ser: 0.9 mg/dL (ref 0.50–1.35)
GFR calc non Af Amer: >90 mL/min (ref >90)
Glucose, Bld: 144 mg/dL — ABNORMAL HIGH (ref 70–99)

## 2012-04-20 MED ORDER — DEXTROSE 5 % IV SOLN
1.0000 g | Freq: Two times a day (BID) | INTRAVENOUS | Status: DC
  Administered 2012-04-20 – 2012-04-22 (×4): 1 g via INTRAVENOUS
  Filled 2012-04-20 (×6): qty 1

## 2012-04-20 MED ORDER — FENTANYL CITRATE 0.05 MG/ML IJ SOLN
INTRAMUSCULAR | Status: AC
  Administered 2012-04-20: 100 ug
  Filled 2012-04-20: qty 2

## 2012-04-20 NOTE — Progress Notes (Signed)
13 Days Post-Op  Subjective: Remains extubated but on BIPAP Still very anxious Able to follow commands  Objective: Vital signs in last 24 hours: Temp:  [99.7 F (37.6 C)-100.9 F (38.3 C)] 100.9 F (38.3 C) (08/05 0600) Pulse Rate:  [85-109] 102  (08/05 0600) Resp:  [21-38] 28  (08/05 0600) BP: (103-158)/(54-105) 151/80 mmHg (08/05 0600) SpO2:  [88 %-100 %] 100 % (08/05 0600) FiO2 (%):  [39.8 %-40.7 %] 40 % (08/05 0600) Weight:  [299 lb 9.7 oz (135.9 kg)] 299 lb 9.7 oz (135.9 kg) (08/05 0600) Last BM Date: 04/20/12  Intake/Output from previous day: 08/04 0701 - 08/05 0700 In: 600 [I.V.:500; NG/GT:100] Out: 4650 [Urine:4500; Stool:150] Intake/Output this shift:    Gen:  Comfortable but anxious Lungs:  Mostly clear, rate increased CV:  Tachy, reg rhythm Abd:  Soft, non distended, non tender Ext:  Remains in splint  Lab Results:   Basename 04/20/12 0400 04/19/12 0339  WBC 14.3* 14.3*  HGB 9.2* 7.7*  HCT 29.3* 24.7*  PLT 621* 510*   BMET  Basename 04/20/12 0400 04/19/12 0339  NA 142 140  K 4.5 4.2  CL 106 107  CO2 29 27  GLUCOSE 144* 259*  BUN 24* 26*  CREATININE 0.90 0.87  CALCIUM 8.3* 7.9*   PT/INR No results found for this basename: LABPROT:2,INR:2 in the last 72 hours ABG No results found for this basename: PHART:2,PCO2:2,PO2:2,HCO3:2 in the last 72 hours  Studies/Results: Dg Chest Port 1 View  04/19/2012  *RADIOLOGY REPORT*  Clinical Data: Respiratory failure  PORTABLE CHEST - 1 VIEW  Comparison:   the previous day's study  Findings: Endotracheal tube, nasogastric tube, and right arm PICC are stable in position.  Low lung volumes with resultant crowding of bronchovascular structures.  Suspect mild diffuse interstitial edema or infiltrates.  There is patchy atelectasis or consolidation at the left lung base, slightly increased since prior exam.  Cannot exclude layering right pleural effusion.  Regional bones unremarkable.  IMPRESSION:  1.  Persistent  bilateral edema/infiltrates with worsening left retrocardiac consolidation / atelectasis. 2. Support hardware stable in position.  Original Report Authenticated By: Osa Craver, M.D.   Dg Chest Port 1 View  04/18/2012  *RADIOLOGY REPORT*  Clinical Data: ET tube placement  PORTABLE CHEST - 1 VIEW  Comparison: 04/17/2012  Findings: The ET tube tip is just above the level of the carina. There is aright subclavian catheter with tip in the right atrium.  Heart size appears moderately enlarged.  Lung volumes are low.  No change in pulmonary edema.  IMPRESSION:  1.  Tip of ET tube is just above the carina. 2.  No change in pulmonary edema pattern.  Original Report Authenticated By: Rosealee Albee, M.D.    Anti-infectives: Anti-infectives     Start     Dose/Rate Route Frequency Ordered Stop   04/13/12 0900   ciprofloxacin (CIPRO) IVPB 400 mg        400 mg 200 mL/hr over 60 Minutes Intravenous Every 12 hours 04/13/12 0832 04/18/12 2143   04/10/12 0600   vancomycin (VANCOCIN) 1,500 mg in sodium chloride 0.9 % 500 mL IVPB  Status:  Discontinued        1,500 mg 250 mL/hr over 120 Minutes Intravenous Every 12 hours 04/09/12 1553 04/13/12 0832   04/09/12 1800   vancomycin (VANCOCIN) 2,500 mg in sodium chloride 0.9 % 500 mL IVPB        2,500 mg 250 mL/hr over 120 Minutes Intravenous  Once 04/09/12 1553 04/09/12 2031   04/09/12 1530   piperacillin-tazobactam (ZOSYN) IVPB 3.375 g  Status:  Discontinued        3.375 g 12.5 mL/hr over 240 Minutes Intravenous 3 times per day 04/09/12 1516 04/13/12 0832   04/07/12 2200   ceFAZolin (ANCEF) IVPB 2 g/50 mL premix        2 g 100 mL/hr over 30 Minutes Intravenous 3 times per day 04/07/12 1818 04/08/12 1459   04/06/12 0830   ceFAZolin (ANCEF) 3 g in dextrose 5 % 50 mL IVPB        3 g 160 mL/hr over 30 Minutes Intravenous  Once 04/06/12 0809 04/07/12 1403   04/03/12 1400   ceFAZolin (ANCEF) IVPB 1 g/50 mL premix        1 g 100 mL/hr over 30  Minutes Intravenous 3 times per day 04/03/12 0930 04/07/12 1359   04/03/12 1200   gentamicin (GARAMYCIN) 700 mg in dextrose 5 % 100 mL IVPB        700 mg 117.5 mL/hr over 60 Minutes Intravenous Every 24 hours 04/03/12 1035 04/04/12 1340   04/03/12 0600   ceFAZolin (ANCEF) IVPB 1 g/50 mL premix  Status:  Discontinued        1 g 100 mL/hr over 30 Minutes Intravenous 3 times per day 04/03/12 0550 04/03/12 0930   04/03/12 0249   polymyxin B 500,000 Units, bacitracin 50,000 Units in sodium chloride irrigation 0.9 % 500 mL irrigation  Status:  Discontinued          As needed 04/03/12 0249 04/03/12 0528   04/02/12 2315   ceFAZolin (ANCEF) IVPB 1 g/50 mL premix        1 g 100 mL/hr over 30 Minutes Intravenous  Once 04/02/12 2304 04/03/12 0125          Assessment/Plan: s/p Procedure(s) (LRB): INTRAMEDULLARY (IM) NAIL TIBIAL (Left) INTRAMEDULLARY (IM) NAIL FEMORAL (Left) OPEN REDUCTION INTERNAL (ORIF) FIXATION PATELLA (Left) OPEN REDUCTION INTERNAL FIXATION (ORIF) PELVIC FRACTURE (N/A) FLEXIBLE BRONCHOSCOPY ()  Pulm:  Continue BIPAP and pulm toilet.  Check CXR Nutrition:  Poor gag.  Will get speech to evaluate.  Place PANDA for meds, TF PT/OT consult Continue foley cath for strict I's and O's C.Diff negative Continue IV antibiotics  LOS: 18 days    Myrick Mcnairy A 04/20/2012
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
## 2012-04-20 NOTE — Progress Notes (Signed)
Patient ID: Miguel Brady, male   DOB: [birth year], 51 y.o.   MRN: [id number] BP 153/75  Pulse 97  Temp 100 F (37.8 C) (Other (Comment))  Resp 27  Ht 5\' 11"  (1.803 m)  Wt 135.9 kg (299 lb 9.7 oz)  BMI 41.79 kg/m2  SpO2 96% Alert and oriented x 3 Speech is clear, fluent Perrl, full eom Moving all extremities Obviously doing well neurlogically, breathing better today than yesterday. Would expect gradual improvement.

## 2012-04-20 NOTE — Progress Notes (Signed)
UR completed 

## 2012-04-20 NOTE — Clinical Social Work Note (Signed)
Clinical Social Worker met with patient mother in 2300 waiting area to offer continued support.  Patient mother has an appointment scheduled for 16:00 08/06 to complete Disability application on patient behalf.  Patient has been extubated and seems to be doing well.  CSW spoke briefly with patient at bedside.  Patient mother is hopeful that patient can remain at [facility] for inpatient rehab needs - CSW contacted PA for rehab consult and provided notice to Inpatient Rehab admissions coordinator.    Clinical Social Worker will begin SNF search in [place] and surrounding counties.  Patient remains NPO at this time and awaiting diet recommendations.  CSW will follow up with patient and patient family with available bed offers.  CSW available for continued support as needed.  Macario Golds, [place] [phone]
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
## 2012-04-21 ENCOUNTER — Inpatient Hospital Stay (HOSPITAL_COMMUNITY): Payer: No Typology Code available for payment source

## 2012-04-21 DIAGNOSIS — S72309A Unspecified fracture of shaft of unspecified femur, initial encounter for closed fracture: Secondary | ICD-10-CM

## 2012-04-21 DIAGNOSIS — S069X9A Unspecified intracranial injury with loss of consciousness of unspecified duration, initial encounter: Secondary | ICD-10-CM

## 2012-04-21 DIAGNOSIS — S82409A Unspecified fracture of shaft of unspecified fibula, initial encounter for closed fracture: Secondary | ICD-10-CM

## 2012-04-21 DIAGNOSIS — S82209A Unspecified fracture of shaft of unspecified tibia, initial encounter for closed fracture: Secondary | ICD-10-CM

## 2012-04-21 DIAGNOSIS — S069XAA Unspecified intracranial injury with loss of consciousness status unknown, initial encounter: Secondary | ICD-10-CM

## 2012-04-21 LAB — GLUCOSE, CAPILLARY: Glucose-Capillary: 151 mg/dL — ABNORMAL HIGH (ref 70–99)

## 2012-04-21 LAB — BASIC METABOLIC PANEL
Calcium: 8.5 mg/dL (ref 8.4–10.5)
GFR calc Af Amer: >90 mL/min (ref >90)
GFR calc non Af Amer: >90 mL/min (ref >90)
Glucose, Bld: 126 mg/dL — ABNORMAL HIGH (ref 70–99)
Potassium: 3.9 mEq/L (ref 3.5–5.1)
Sodium: 143 mEq/L (ref 135–145)

## 2012-04-21 LAB — CBC
Hemoglobin: 9.6 g/dL — ABNORMAL LOW (ref 13.0–17.0)
MCH: 27.4 pg (ref 26.0–34.0)
MCHC: 30.8 g/dL (ref 30.0–36.0)
RDW: 17.9 % — ABNORMAL HIGH (ref 11.5–15.5)

## 2012-04-21 MED ORDER — ENSURE COMPLETE PO LIQD
237.0000 mL | Freq: Two times a day (BID) | ORAL | Status: DC
  Administered 2012-04-22: 237 mL via ORAL
  Filled 2012-04-21: qty 237

## 2012-04-21 MED ORDER — PRO-STAT SUGAR FREE PO LIQD
30.0000 mL | Freq: Three times a day (TID) | ORAL | Status: DC
  Administered 2012-04-22 (×2): 30 mL via ORAL
  Filled 2012-04-21 (×5): qty 30

## 2012-04-21 MED ORDER — MORPHINE SULFATE 2 MG/ML IJ SOLN
1.0000 mg | INTRAMUSCULAR | Status: DC | PRN
  Administered 2012-04-22 (×2): 2 mg via INTRAVENOUS
  Filled 2012-04-21 (×3): qty 1

## 2012-04-21 MED ORDER — METOPROLOL TARTRATE 25 MG PO TABS
25.0000 mg | ORAL_TABLET | Freq: Two times a day (BID) | ORAL | Status: DC
  Administered 2012-04-21 – 2012-04-22 (×3): 25 mg via ORAL
  Filled 2012-04-21 (×5): qty 1

## 2012-04-21 MED ORDER — ALTEPLASE 2 MG IJ SOLR
2.0000 mg | Freq: Once | INTRAMUSCULAR | Status: AC
  Administered 2012-04-21: 2 mg
  Filled 2012-04-21 (×2): qty 2

## 2012-04-21 MED ORDER — OXYCODONE HCL 5 MG PO TABS: 5.0000 mg | ORAL_TABLET | ORAL | Status: DC | PRN

## 2012-04-21 MED ORDER — ALTEPLASE 2 MG IJ SOLR
2.0000 mg | Freq: Once | INTRAMUSCULAR | Status: AC
  Administered 2012-04-21: 2 mg
  Filled 2012-04-21: qty 2

## 2012-04-21 NOTE — Clinical Social Work Placement (Addendum)
    Clinical Social Work Department CLINICAL SOCIAL WORK PLACEMENT NOTE 04/21/2012  Patient:  Miguel Brady, Miguel Brady  Account Number:  [id number] Admit date:  04/02/2012  Clinical Social Worker:  Macario Golds, Theresia Majors  Date/time:  04/21/2012 10:15 AM  Clinical Social Work is seeking post-discharge placement for this patient at the following level of care:   SKILLED NURSING   (*CSW will update this form in Epic as items are completed)   04/21/2012  Patient/family provided with [facility] Department of Clinical Social Work's list of facilities offering this level of care within the geographic area requested by the patient (or if unable, by the patient's family).  04/21/2012  Patient/family informed of their freedom to choose among providers that offer the needed level of care, that participate in Medicare, Medicaid or managed care program needed by the patient, have an available bed and are willing to accept the patient.  04/21/2012  Patient/family informed of [facility]' ownership interest in [facility], as well as of the fact that they are under no obligation to receive care at this facility.  PASARR submitted to EDS on 04/21/2012 PASARR number received from EDS on   FL2 transmitted to all facilities in geographic area requested by pt/family on  04/21/2012 FL2 transmitted to all facilities within larger geographic area on   Patient informed that his/her managed care company has contracts with or will negotiate with  certain facilities, including the following:     Patient/family informed of bed offers received:   Patient chooses bed at  Physician recommends and patient chooses bed at    Patient to be transferred to Inpatient Rehab on  04/22/2012 Patient to be transferred to facility by   The following physician request were entered in Epic:   Additional Comments: 08/06 Patient will be Medicaid/Disability pending at discharge  08/07 Patient has been accepted to [facility]  Inpatient Rehab and plans to transfer today
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
## 2012-04-21 NOTE — Progress Notes (Signed)
Pt discussed in hospital LOS meeting today. 
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
## 2012-04-21 NOTE — Progress Notes (Signed)
Pt arrived from 2300, VSS, family at bedside.
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
## 2012-04-22 ENCOUNTER — Encounter: Payer: Self-pay | Admitting: Emergency Medicine

## 2012-04-22 ENCOUNTER — Inpatient Hospital Stay (HOSPITAL_COMMUNITY)
Admission: RE | Admit: 2012-04-22 | Discharge: 2012-05-13 | DRG: 945 | Disposition: A | Discharge status: Skilled Nursing Facility | Payer: Medicaid Other | Source: Ambulatory Visit | Attending: Physical Medicine & Rehabilitation | Admitting: Physical Medicine & Rehabilitation

## 2012-04-22 ENCOUNTER — Encounter (HOSPITAL_COMMUNITY): Payer: Self-pay | Admitting: Rehabilitation

## 2012-04-22 DIAGNOSIS — S82899A Other fracture of unspecified lower leg, initial encounter for closed fracture: Secondary | ICD-10-CM

## 2012-04-22 DIAGNOSIS — S020XXA Fracture of vault of skull, initial encounter for closed fracture: Secondary | ICD-10-CM | POA: Diagnosis present

## 2012-04-22 DIAGNOSIS — S7292XB Unspecified fracture of left femur, initial encounter for open fracture type I or II: Secondary | ICD-10-CM | POA: Diagnosis present

## 2012-04-22 DIAGNOSIS — Z5189 Encounter for other specified aftercare: Secondary | ICD-10-CM

## 2012-04-22 DIAGNOSIS — R339 Retention of urine, unspecified: Secondary | ICD-10-CM

## 2012-04-22 DIAGNOSIS — S069X9A Unspecified intracranial injury with loss of consciousness of unspecified duration, initial encounter: Secondary | ICD-10-CM

## 2012-04-22 DIAGNOSIS — S271XXA Traumatic hemothorax, initial encounter: Secondary | ICD-10-CM

## 2012-04-22 DIAGNOSIS — S82009A Unspecified fracture of unspecified patella, initial encounter for closed fracture: Secondary | ICD-10-CM | POA: Diagnosis present

## 2012-04-22 DIAGNOSIS — I10 Essential (primary) hypertension: Secondary | ICD-10-CM

## 2012-04-22 DIAGNOSIS — J40 Bronchitis, not specified as acute or chronic: Secondary | ICD-10-CM

## 2012-04-22 DIAGNOSIS — S329XXA Fracture of unspecified parts of lumbosacral spine and pelvis, initial encounter for closed fracture: Secondary | ICD-10-CM | POA: Diagnosis present

## 2012-04-22 DIAGNOSIS — S0003XA Contusion of scalp, initial encounter: Secondary | ICD-10-CM

## 2012-04-22 DIAGNOSIS — T794XXA Traumatic shock, initial encounter: Secondary | ICD-10-CM

## 2012-04-22 DIAGNOSIS — S32409A Unspecified fracture of unspecified acetabulum, initial encounter for closed fracture: Secondary | ICD-10-CM

## 2012-04-22 DIAGNOSIS — S8410XA Injury of peroneal nerve at lower leg level, unspecified leg, initial encounter: Secondary | ICD-10-CM

## 2012-04-22 DIAGNOSIS — D62 Acute posthemorrhagic anemia: Secondary | ICD-10-CM

## 2012-04-22 DIAGNOSIS — S82899B Other fracture of unspecified lower leg, initial encounter for open fracture type I or II: Secondary | ICD-10-CM

## 2012-04-22 DIAGNOSIS — S338XXA Sprain of other parts of lumbar spine and pelvis, initial encounter: Secondary | ICD-10-CM

## 2012-04-22 DIAGNOSIS — S72009A Fracture of unspecified part of neck of unspecified femur, initial encounter for closed fracture: Secondary | ICD-10-CM

## 2012-04-22 DIAGNOSIS — B9689 Other specified bacterial agents as the cause of diseases classified elsewhere: Secondary | ICD-10-CM

## 2012-04-22 DIAGNOSIS — S020XXB Fracture of vault of skull, initial encounter for open fracture: Secondary | ICD-10-CM

## 2012-04-22 DIAGNOSIS — S2249XA Multiple fractures of ribs, unspecified side, initial encounter for closed fracture: Secondary | ICD-10-CM

## 2012-04-22 DIAGNOSIS — S82209B Unspecified fracture of shaft of unspecified tibia, initial encounter for open fracture type I or II: Secondary | ICD-10-CM

## 2012-04-22 DIAGNOSIS — S01109A Unspecified open wound of unspecified eyelid and periocular area, initial encounter: Secondary | ICD-10-CM

## 2012-04-22 DIAGNOSIS — S329XXB Fracture of unspecified parts of lumbosacral spine and pelvis, initial encounter for open fracture: Secondary | ICD-10-CM

## 2012-04-22 DIAGNOSIS — S72009B Fracture of unspecified part of neck of unspecified femur, initial encounter for open fracture type I or II: Secondary | ICD-10-CM

## 2012-04-22 DIAGNOSIS — S069XAA Unspecified intracranial injury with loss of consciousness status unknown, initial encounter: Secondary | ICD-10-CM

## 2012-04-22 DIAGNOSIS — D72829 Elevated white blood cell count, unspecified: Secondary | ICD-10-CM

## 2012-04-22 DIAGNOSIS — Z9889 Other specified postprocedural states: Secondary | ICD-10-CM

## 2012-04-22 DIAGNOSIS — E119 Type 2 diabetes mellitus without complications: Secondary | ICD-10-CM

## 2012-04-22 HISTORY — DX: Gastro-esophageal reflux disease without esophagitis: K21.9

## 2012-04-22 HISTORY — DX: Unspecified asthma, uncomplicated: J45.909

## 2012-04-22 HISTORY — DX: Nicotine dependence, unspecified, uncomplicated: F17.200

## 2012-04-22 HISTORY — DX: Trigger finger, unspecified finger: M65.30

## 2012-04-22 LAB — GLUCOSE, CAPILLARY
Glucose-Capillary: 111 mg/dL — ABNORMAL HIGH (ref 70–99)
Glucose-Capillary: 113 mg/dL — ABNORMAL HIGH (ref 70–99)
Glucose-Capillary: 116 mg/dL — ABNORMAL HIGH (ref 70–99)

## 2012-04-22 MED ORDER — DEXTROSE 5 % IV SOLN
1.0000 g | Freq: Two times a day (BID) | INTRAVENOUS | Status: AC
  Administered 2012-04-22 – 2012-04-26 (×9): 1 g via INTRAVENOUS
  Filled 2012-04-22 (×10): qty 1

## 2012-04-22 MED ORDER — ENSURE COMPLETE PO LIQD: 237.0000 mL | Freq: Two times a day (BID) | ORAL | Status: DC

## 2012-04-22 MED ORDER — INSULIN GLARGINE 100 UNIT/ML ~~LOC~~ SOLN: 10.0000 [IU] | Freq: Every day | SUBCUTANEOUS | Status: DC

## 2012-04-22 MED ORDER — QUETIAPINE FUMARATE 50 MG PO TABS
50.0000 mg | ORAL_TABLET | Freq: Two times a day (BID) | ORAL | Status: DC
  Administered 2012-04-23 – 2012-04-27 (×8): 50 mg via ORAL
  Filled 2012-04-22 (×12): qty 1

## 2012-04-22 MED ORDER — ONDANSETRON HCL 4 MG/2ML IJ SOLN: 4.0000 mg | Freq: Four times a day (QID) | INTRAMUSCULAR | Status: DC | PRN

## 2012-04-22 MED ORDER — BIOTENE DRY MOUTH MT LIQD
15.0000 mL | Freq: Four times a day (QID) | OROMUCOSAL | Status: DC
  Administered 2012-04-23 – 2012-05-13 (×50): 15 mL via OROMUCOSAL

## 2012-04-22 MED ORDER — POLYETHYLENE GLYCOL 3350 17 G PO PACK
17.0000 g | PACK | Freq: Every day | ORAL | Status: DC | PRN
  Administered 2012-05-03 – 2012-05-05 (×2): 17 g via ORAL
  Filled 2012-04-22: qty 1

## 2012-04-22 MED ORDER — PNEUMOCOCCAL VAC POLYVALENT 25 MCG/0.5ML IJ INJ
0.5000 mL | INJECTION | INTRAMUSCULAR | Status: AC
  Filled 2012-04-22: qty 0.5

## 2012-04-22 MED ORDER — POTASSIUM CHLORIDE 20 MEQ/15ML (10%) PO LIQD
40.0000 meq | Freq: Every day | ORAL | Status: DC
  Administered 2012-04-22 – 2012-04-23 (×2): 40 meq via ORAL
  Filled 2012-04-22 (×3): qty 30

## 2012-04-22 MED ORDER — ENOXAPARIN SODIUM 40 MG/0.4ML ~~LOC~~ SOLN
40.0000 mg | SUBCUTANEOUS | Status: DC
  Administered 2012-04-23 – 2012-05-12 (×20): 40 mg via SUBCUTANEOUS
  Filled 2012-04-22 (×22): qty 0.4

## 2012-04-22 MED ORDER — METHOCARBAMOL 500 MG PO TABS
500.0000 mg | ORAL_TABLET | Freq: Four times a day (QID) | ORAL | Status: DC | PRN
  Administered 2012-05-03 – 2012-05-10 (×4): 500 mg via ORAL
  Filled 2012-04-22 (×4): qty 1

## 2012-04-22 MED ORDER — PROCHLORPERAZINE 25 MG RE SUPP
12.5000 mg | Freq: Four times a day (QID) | RECTAL | Status: DC | PRN
  Filled 2012-04-22: qty 1

## 2012-04-22 MED ORDER — PROCHLORPERAZINE EDISYLATE 5 MG/ML IJ SOLN
5.0000 mg | Freq: Four times a day (QID) | INTRAMUSCULAR | Status: DC | PRN
  Filled 2012-04-22: qty 2

## 2012-04-22 MED ORDER — CHLORHEXIDINE GLUCONATE 0.12 % MT SOLN
15.0000 mL | Freq: Two times a day (BID) | OROMUCOSAL | Status: DC
  Administered 2012-04-22 – 2012-05-08 (×33): 15 mL via OROMUCOSAL
  Filled 2012-04-22 (×36): qty 15

## 2012-04-22 MED ORDER — FLEET ENEMA 7-19 GM/118ML RE ENEM: 1.0000 | ENEMA | Freq: Once | RECTAL | Status: AC | PRN

## 2012-04-22 MED ORDER — LISINOPRIL 5 MG PO TABS
5.0000 mg | ORAL_TABLET | Freq: Every day | ORAL | Status: DC
  Administered 2012-04-22 – 2012-04-23 (×2): 5 mg via ORAL
  Filled 2012-04-22 (×4): qty 1

## 2012-04-22 MED ORDER — LISINOPRIL 5 MG PO TABS: 5.0000 mg | ORAL_TABLET | Freq: Every day | ORAL | Status: DC

## 2012-04-22 MED ORDER — OXYCODONE HCL 5 MG PO TABS
5.0000 mg | ORAL_TABLET | ORAL | Status: DC | PRN
  Administered 2012-04-23 – 2012-04-24 (×6): 10 mg via ORAL
  Filled 2012-04-22 (×6): qty 2

## 2012-04-22 MED ORDER — CLONAZEPAM 0.5 MG PO TABS
1.0000 mg | ORAL_TABLET | Freq: Two times a day (BID) | ORAL | Status: DC
  Administered 2012-04-22 – 2012-04-27 (×11): 1 mg
  Filled 2012-04-22 (×2): qty 2
  Filled 2012-04-22: qty 1
  Filled 2012-04-22: qty 2
  Filled 2012-04-22: qty 1
  Filled 2012-04-22 (×6): qty 2
  Filled 2012-04-22 (×2): qty 1

## 2012-04-22 MED ORDER — DIPHENHYDRAMINE HCL 12.5 MG/5ML PO ELIX: 12.5000 mg | ORAL_SOLUTION | Freq: Four times a day (QID) | ORAL | Status: DC | PRN

## 2012-04-22 MED ORDER — ONDANSETRON HCL 4 MG PO TABS
4.0000 mg | ORAL_TABLET | Freq: Four times a day (QID) | ORAL | Status: DC | PRN
  Filled 2012-04-22: qty 1

## 2012-04-22 MED ORDER — QUETIAPINE FUMARATE 100 MG PO TABS
100.0000 mg | ORAL_TABLET | Freq: Every day | ORAL | Status: DC
  Administered 2012-04-22 – 2012-04-26 (×5): 100 mg via ORAL
  Filled 2012-04-22 (×6): qty 1

## 2012-04-22 MED ORDER — INSULIN ASPART 100 UNIT/ML ~~LOC~~ SOLN
0.0000 [IU] | Freq: Three times a day (TID) | SUBCUTANEOUS | Status: DC
  Administered 2012-04-24 – 2012-05-07 (×10): 1 [IU] via SUBCUTANEOUS
  Administered 2012-05-10: 0 [IU] via SUBCUTANEOUS
  Administered 2012-05-11 (×3): 1 [IU] via SUBCUTANEOUS

## 2012-04-22 MED ORDER — METOPROLOL TARTRATE 25 MG PO TABS
25.0000 mg | ORAL_TABLET | Freq: Two times a day (BID) | ORAL | Status: DC
  Administered 2012-04-22 – 2012-05-02 (×20): 25 mg via ORAL
  Filled 2012-04-22 (×26): qty 1

## 2012-04-22 MED ORDER — TRAZODONE HCL 50 MG PO TABS
25.0000 mg | ORAL_TABLET | Freq: Every evening | ORAL | Status: DC | PRN
  Administered 2012-05-05: 50 mg via ORAL
  Filled 2012-04-22: qty 1

## 2012-04-22 MED ORDER — QUETIAPINE FUMARATE 50 MG PO TABS
50.0000 mg | ORAL_TABLET | Freq: Three times a day (TID) | ORAL | Status: DC
  Filled 2012-04-22 (×2): qty 2

## 2012-04-22 MED ORDER — ALBUTEROL SULFATE (5 MG/ML) 0.5% IN NEBU: 2.5000 mg | INHALATION_SOLUTION | Freq: Two times a day (BID) | RESPIRATORY_TRACT | Status: DC

## 2012-04-22 MED ORDER — PROCHLORPERAZINE MALEATE 5 MG PO TABS
5.0000 mg | ORAL_TABLET | Freq: Four times a day (QID) | ORAL | Status: DC | PRN
  Filled 2012-04-22: qty 2

## 2012-04-22 MED ORDER — BISACODYL 10 MG RE SUPP: 10.0000 mg | Freq: Every day | RECTAL | Status: DC | PRN

## 2012-04-22 MED ORDER — ALBUTEROL SULFATE (5 MG/ML) 0.5% IN NEBU: 2.5000 mg | INHALATION_SOLUTION | RESPIRATORY_TRACT | Status: DC | PRN

## 2012-04-22 MED ORDER — TRAMADOL HCL 50 MG PO TABS
50.0000 mg | ORAL_TABLET | Freq: Four times a day (QID) | ORAL | Status: DC | PRN
  Administered 2012-04-25 – 2012-05-02 (×3): 50 mg via ORAL
  Filled 2012-04-22 (×4): qty 1

## 2012-04-22 MED ORDER — GUAIFENESIN-DM 100-10 MG/5ML PO SYRP: 5.0000 mL | ORAL_SOLUTION | Freq: Four times a day (QID) | ORAL | Status: DC | PRN

## 2012-04-22 MED ORDER — ALUM & MAG HYDROXIDE-SIMETH 200-200-20 MG/5ML PO SUSP: 30.0000 mL | ORAL | Status: DC | PRN

## 2012-04-22 MED ORDER — INSULIN ASPART 100 UNIT/ML ~~LOC~~ SOLN: 0.0000 [IU] | SUBCUTANEOUS | Status: DC

## 2012-04-22 MED ORDER — INSULIN ASPART 100 UNIT/ML ~~LOC~~ SOLN: 0.0000 [IU] | Freq: Every day | SUBCUTANEOUS | Status: DC

## 2012-04-22 MED ORDER — METFORMIN HCL 500 MG PO TABS
500.0000 mg | ORAL_TABLET | Freq: Two times a day (BID) | ORAL | Status: DC
  Administered 2012-04-23 – 2012-05-13 (×39): 500 mg via ORAL
  Filled 2012-04-22 (×44): qty 1

## 2012-04-22 MED ORDER — PANTOPRAZOLE SODIUM 40 MG PO TBEC
40.0000 mg | DELAYED_RELEASE_TABLET | Freq: Every day | ORAL | Status: DC
  Administered 2012-04-24 – 2012-05-12 (×18): 40 mg via ORAL
  Filled 2012-04-22 (×18): qty 1

## 2012-04-22 MED ORDER — GERHARDT'S BUTT CREAM
TOPICAL_CREAM | CUTANEOUS | Status: DC | PRN
  Filled 2012-04-22: qty 1

## 2012-04-22 MED ORDER — ACETAMINOPHEN 325 MG PO TABS: 325.0000 mg | ORAL_TABLET | ORAL | Status: DC | PRN

## 2012-04-22 NOTE — Progress Notes (Signed)
Physical Therapy Treatment Patient Details Name: Miguel Brady MRN: [id number] DOB: 1960/12/09 Today's Date: 04/22/2012 Time: 4098-1191 PT Time Calculation (min): 35 min  PT Assessment / Plan / Recommendation Comments on Treatment Session  Pt able to tolerate active assist knee flexion to 40 degrees well.  Pt improved his initiation and execution of bed mobility to EOB and sat EOB approx 15 min while bed was changed and he was washed up.  Pt expressed his joy at doing the therapy today.    Follow Up Recommendations  Inpatient Rehab    Barriers to Discharge        Equipment Recommendations  Defer to next venue    Recommendations for Other Services    Frequency Min 3X/week   Plan Discharge plan remains appropriate    Precautions / Restrictions Precautions Precautions: Other (comment) (2-4th week post op----0-40 degrees AAROM L knee flexion) Precaution Comments: NWB on LLE; only gentle knee flex to 40 degrees on Lt; NO active knee extension on lt; WBAT on RLE for transfers. Required Braces or Orthoses: Other Brace/Splint Other Brace/Splint: Hinged knee brace on LLE in extension. Restrictions Weight Bearing Restrictions: Yes RLE Weight Bearing: Weight bearing as tolerated LLE Weight Bearing: Non weight bearing   Pertinent Vitals/Pain     Mobility  Bed Mobility Bed Mobility: Supine to Sit;Sitting - Scoot to Edge of Bed;Sit to Supine Supine to Sit: 1: +2 Total assist Supine to Sit: Patient Percentage: 30% Sitting - Scoot to Edge of Bed: 1: +2 Total assist Sitting - Scoot to Edge of Bed: Patient Percentage: 30% Sit to Supine: 1: +2 Total assist Sit to Supine: Patient Percentage: 20% Scooting to HOB: 1: +2 Total assist Scooting to [facility]: Patient Percentage: 30% Details for Bed Mobility Assistance: vc/tc's for hand placement/technique to get the most from his trunka dn UE's; truncal assist Transfers Transfers: Not assessed Ambulation/Gait Ambulation/Gait Assistance: Not tested  (comment) Stairs: No    Exercises     PT Diagnosis:    PT Problem List:   PT Treatment Interventions:     PT Goals Acute Rehab PT Goals Time For Goal Achievement: 05/04/12 Potential to Achieve Goals: Good PT Goal: Supine/Side to Sit - Progress: Progressing toward goal PT Goal: Sit at Edge Of Bed - Progress: Met PT Goal: Sit to Supine/Side - Progress: Progressing toward goal  Visit Information  Last PT Received On: 04/22/12 Assistance Needed: +2    Subjective Data  Subjective: I'm going to run around this unit so I can get out of here   Cognition  Overall Cognitive Status: Impaired Area of Impairment: Attention;Problem solving Arousal/Alertness: Awake/alert Behavior During Session: [facility] for tasks performed Current Attention Level: Focused Following Commands: Follows one step commands consistently;Follows multi-step commands inconsistently Cognition - Other Comments: rambling, confabulation    Balance  Balance Balance Assessed: Yes Static Sitting Balance Static Sitting - Balance Support: Left upper extremity supported;Right upper extremity supported;Feet supported Static Sitting - Level of Assistance: 5: Stand by assistance;4: Min assist;Other (comment) (min when balance challenged)  End of Session PT - End of Session Equipment Utilized During Treatment: Right knee immobilizer Activity Tolerance: Patient tolerated treatment well Patient left: in bed;with call bell/phone within reach Nurse Communication: Mobility status   GP     Miguel Brady, Miguel Brady 04/22/2012, 3:40 PM  04/22/2012  Miguel Brady, PT [phone] (pager)

## 2012-04-22 NOTE — Progress Notes (Signed)
Patient arrived in the unit at 1550 . Mother at bedside. Educ. Packet given to patient and family. Explained rehab process and reviewed packet and safety plan to patient and family. All questioned answered at this time. Call bell within reach. Mother signed safety plan.Belongings at bedside. Continue to monitor patient.

## 2012-04-22 NOTE — Progress Notes (Signed)
Trauma Service Note  Subjective: The patient is confabulating a bit.  Does not want to eat.  Objective: Vital signs in last 24 hours: Temp:  [98.3 F (36.8 C)-100.4 F (38 C)] 98.3 F (36.8 C) (08/07 0800) Pulse Rate:  [90-101] 92  (08/07 0425) Resp:  [19-32] 28  (08/07 0425) BP: (119-179)/(84-97) 179/97 mmHg (08/07 0800) SpO2:  [91 %-100 %] 93 % (08/07 0425) FiO2 (%):  [21 %] 21 % (08/06 1340) Weight:  [134.5 kg (296 lb 8.3 oz)] 134.5 kg (296 lb 8.3 oz) (08/07 0500) Last BM Date: 04/20/12  Intake/Output from previous day: 08/06 0701 - 08/07 0700 In: 390 [I.V.:340; IV Piggyback:50] Out: 1990 [Urine:1590; Stool:400] Intake/Output this shift: Total I/O In: 100 [P.O.:60; I.V.:40] Out: 325 [Urine:225; Stool:100]  General: No acute distress  Lungs: Clear.  Coughing  A bit.  No acute hypoxemia  Abd: Soft, good bowel sounds, nontender.  Poor appetite  Extremities: No changes  Neuro: Confused a bit.  Has had some morphine recently.  Lab Results: CBC   Basename 04/21/12 0500 04/20/12 0400  WBC 15.7* 14.3*  HGB 9.6* 9.2*  HCT 31.2* 29.3*  PLT 601* 621*   BMET  Basename 04/21/12 0500 04/20/12 0400  NA 143 142  K 3.9 4.5  CL 107 106  CO2 29 29  GLUCOSE 126* 144*  BUN 22 24*  CREATININE 0.88 0.90  CALCIUM 8.5 8.3*   PT/INR No results found for this basename: LABPROT:2,INR:2 in the last 72 hours ABG  Basename 04/19/12 1149  PHART 7.390  HCO3 29.6*    Studies/Results: Dg Chest Port 1 View  04/21/2012  *RADIOLOGY REPORT*  Clinical Data: Pulmonary edema  PORTABLE CHEST - 1 VIEW  Comparison: Yesterday  Findings: PICC stable.  Bilateral airspace disease and bilateral pleural effusions stable on the right and improved on the left.  No pneumothorax.  Normal heart size.  IMPRESSION: Bilateral effusions and airspace disease stable on the right but improved on the left.  Original Report Authenticated By: Donavan Burnet, M.D.    Anti-infectives: Anti-infectives    Start     Dose/Rate Route Frequency Ordered Stop   04/20/12 2200   ceFEPIme (MAXIPIME) 1 g in dextrose 5 % 50 mL IVPB        1 g 100 mL/hr over 30 Minutes Intravenous Every 12 hours 04/20/12 1622 04/27/12 2159   04/13/12 0900   ciprofloxacin (CIPRO) IVPB 400 mg        400 mg 200 mL/hr over 60 Minutes Intravenous Every 12 hours 04/13/12 0832 04/18/12 2143   04/10/12 0600   vancomycin (VANCOCIN) 1,500 mg in sodium chloride 0.9 % 500 mL IVPB  Status:  Discontinued        1,500 mg 250 mL/hr over 120 Minutes Intravenous Every 12 hours 04/09/12 1553 04/13/12 0832   04/09/12 1800   vancomycin (VANCOCIN) 2,500 mg in sodium chloride 0.9 % 500 mL IVPB        2,500 mg 250 mL/hr over 120 Minutes Intravenous  Once 04/09/12 1553 04/09/12 2031   04/09/12 1530   piperacillin-tazobactam (ZOSYN) IVPB 3.375 g  Status:  Discontinued        3.375 g 12.5 mL/hr over 240 Minutes Intravenous 3 times per day 04/09/12 1516 04/13/12 0832   04/07/12 2200   ceFAZolin (ANCEF) IVPB 2 g/50 mL premix        2 g 100 mL/hr over 30 Minutes Intravenous 3 times per day 04/07/12 1818 04/08/12 1459   04/06/12 0830  ceFAZolin (ANCEF) 3 g in dextrose 5 % 50 mL IVPB        3 g 160 mL/hr over 30 Minutes Intravenous  Once 04/06/12 0809 04/07/12 1403   04/03/12 1400   ceFAZolin (ANCEF) IVPB 1 g/50 mL premix        1 g 100 mL/hr over 30 Minutes Intravenous 3 times per day 04/03/12 0930 04/07/12 1359   04/03/12 1200   gentamicin (GARAMYCIN) 700 mg in dextrose 5 % 100 mL IVPB        700 mg 117.5 mL/hr over 60 Minutes Intravenous Every 24 hours 04/03/12 1035 04/04/12 1340   04/03/12 0600   ceFAZolin (ANCEF) IVPB 1 g/50 mL premix  Status:  Discontinued        1 g 100 mL/hr over 30 Minutes Intravenous 3 times per day 04/03/12 0550 04/03/12 0930   04/03/12 0249   polymyxin B 500,000 Units, bacitracin 50,000 Units in sodium chloride irrigation 0.9 % 500 mL irrigation  Status:  Discontinued          As needed 04/03/12 0249  04/03/12 0528   04/02/12 2315   ceFAZolin (ANCEF) IVPB 1 g/50 mL premix        1 g 100 mL/hr over 30 Minutes Intravenous  Once 04/02/12 2304 04/03/12 0125          Assessment/Plan: s/p Procedure(s): INTRAMEDULLARY (IM) NAIL TIBIAL INTRAMEDULLARY (IM) NAIL FEMORAL OPEN REDUCTION INTERNAL (ORIF) FIXATION PATELLA OPEN REDUCTION INTERNAL FIXATION (ORIF) PELVIC FRACTURE FLEXIBLE BRONCHOSCOPY Advance diet Keep in SDU Could go to Rehab at anytime from our standpoint.  LOS: 20 days   Marta Lamas. Gae Bon, MD, FACS [phone] Trauma Surgeon 04/22/2012

## 2012-04-22 NOTE — Progress Notes (Signed)
Notified MD on call of pt's SBP in 170s. Pt otherwise stable and mentating as he was earlier in the shift and as conveyed in report from previous RN. No orders received; per MD: day team will address and call back if SBP>200. Will continue to monitor.

## 2012-04-22 NOTE — Clinical Social Work Note (Signed)
Clinical Social Worker continuing to follow for emotional support and discharge planning needs.  Patient has been accepted by [facility] and plans to transfer today.  CSW spoke with patient mother who is agreeable with this discharge plan.  Patient mother expressed concerns regarding completion of Disability Application - CSW notified financial counselor who was able to leave a message with appropriate personnel at [facility] to follow up with patient mother.  Patient was faxed out and 2 bed offers were received - inpatient rehab notified if needed.  No SBIRT completion due to patient intermittent confusion and lack of consistency for information purposes.  Clinical Social Worker will sign off for now as social work intervention is no longer needed. Please consult [place] again if new need arises.  Macario Golds, [place] [phone]

## 2012-04-22 NOTE — Progress Notes (Signed)
Subjective: Patient reports sleepy because recently had pain meds  Objective: Vital signs in last 24 hours: Temp:  [98.6 F (37 C)-100.8 F (38.2 C)] 98.6 F (37 C) (08/07 0400) Pulse Rate:  [88-101] 92  (08/07 0425) Resp:  [19-32] 28  (08/07 0425) BP: (119-178)/(73-96) 176/93 mmHg (08/07 0425) SpO2:  [91 %-100 %] 93 % (08/07 0425) FiO2 (%):  [21 %] 21 % (08/06 1340) Weight:  [134.5 kg (296 lb 8.3 oz)] 134.5 kg (296 lb 8.3 oz) (08/07 0500)  Intake/Output from previous day: 08/06 0701 - 08/07 0700 In: 370 [I.V.:320; IV Piggyback:50] Out: 1990 [Urine:1590; Stool:400] Intake/Output this shift:   pe [place], Ox3 , FC all 4 Wound: well healled forehead lac  Lab Results:  Basename 04/21/12 0500 04/20/12 0400  WBC 15.7* 14.3*  HGB 9.6* 9.2*  HCT 31.2* 29.3*  PLT 601* 621*   BMET  Basename 04/21/12 0500 04/20/12 0400  NA 143 142  K 3.9 4.5  CL 107 106  CO2 29 29  GLUCOSE 126* 144*  BUN 22 24*  CREATININE 0.88 0.90  CALCIUM 8.5 8.3*    Studies/Results: Dg Chest Port 1 View  04/21/2012  *RADIOLOGY REPORT*  Clinical Data: Pulmonary edema  PORTABLE CHEST - 1 VIEW  Comparison: Yesterday  Findings: PICC stable.  Bilateral airspace disease and bilateral pleural effusions stable on the right and improved on the left.  No pneumothorax.  Normal heart size.  IMPRESSION: Bilateral effusions and airspace disease stable on the right but improved on the left.  Original Report Authenticated By: Donavan Burnet, M.D.    Assessment/Plan: Pt stable - will follow peripherally  LOS: 20 days     Sherra Kimmons R, MD 04/22/2012, 8:31 AM
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
## 2012-04-22 NOTE — Progress Notes (Signed)
Patients' mother refused staff to unwrap  ace wrap to left lower extremity. Left lower leg assessed to best ability due to patient's mother's request. Patient resting. Call bell within reach.
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
## 2012-04-22 NOTE — Progress Notes (Signed)
Patient ID: Miguel Brady, male   DOB: [birth year], 51 y.o.   MRN: [id number]

## 2012-04-22 NOTE — Progress Notes (Signed)
Speech Language Pathology Dysphagia Treatment Patient Details Name: Miguel Brady MRN: [id number] DOB: May 01, 1961 Today's Date: 04/22/2012 Time: 4098-1191 SLP Time Calculation (min): 19 min  Assessment / Plan / Recommendation Clinical Impression  Treatment focused on tolerance of thin liquids and puree (pt refused most of breakfast). Pt with high RR throughout session  (25-30) which is his baseline. With initial consecutive straw sips pt gasped for air with RR over 35. With min verbal cues for single strae sips pt tolerate thin liquids without any further difficutly or signs of aspiration. Educated pt on importance of precautions. Pt in pain and refusing most of breakfast. SLP repositioned. RN already provided recent pain meds. Despite sedation pts mentation is gradually improving. Will continue to follow for diet [place].     Diet Recommendation  Continue with Current Diet: Dysphagia 2 (fine chop);Thin liquid    SLP Plan Continue with current plan of care   Pertinent Vitals/Pain NA   Swallowing Goals  SLP Swallowing Goals Patient will consume recommended diet without observed clinical signs of aspiration with: Minimal assistance Swallow Study Goal #1 - Progress: Progressing toward goal Patient will utilize recommended strategies during swallow to increase swallowing safety with: Modified independent assistance Swallow Study Goal #2 - Progress: Progressing toward goal  General Temperature Spikes Noted: No Respiratory Status: Room air Behavior/Cognition: Alert;Cooperative Oral Cavity - Dentition: Adequate natural dentition Patient Positioning: Upright in bed  Oral Cavity - Oral Hygiene Does patient have any of the following "at risk" factors?: Nutritional status - dependent feeder Patient is AT RISK - Oral Care Protocol followed (see row info): Yes   Dysphagia Treatment Treatment focused on: Skilled observation of diet tolerance Treatment Methods/Modalities: Skilled  observation Patient observed directly with PO's: Yes Type of PO's observed: Dysphagia 1 (puree);Thin liquids Feeding: Total assist Liquids provided via: Straw Pharyngeal Phase Signs & Symptoms: Changes in respirations Type of cueing: Verbal Amount of cueing: Minimal   GO     Katerra Ingman, Riley Nearing 04/22/2012, 8:55 AM

## 2012-04-22 NOTE — Progress Notes (Signed)
Patient being transferred to rehab per MD order. Report called to RN, pt transferred in bed to unit 4000.
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
## 2012-04-22 NOTE — Progress Notes (Addendum)
Rehab admissions - Evaluated for possible admission.  I spoke with patient's mom.  She is 52 yo and cannot provide the care that patient will need after an inpatient rehab stay.  A brother works 10-12 hr shifts, 6 days a week and lives in [place].  Brother not much help at this point.  Patient lived alone, had no job, and his mother paid the bills.  I will talk to rehab leadership team and see if inpatient rehab with no discharge plan will be an option or if we will need rehab outside of the hospital.  I will follow up.  Call me for questions.  #[phone]  I have talked to rehab leadership team.  We can admit to inpatient rehab today.  Bed available and mom is in agreement.  Call me for questions.  #[phone]

## 2012-04-23 ENCOUNTER — Inpatient Hospital Stay (HOSPITAL_COMMUNITY): Payer: Medicaid Other | Admitting: Occupational Therapy

## 2012-04-23 ENCOUNTER — Inpatient Hospital Stay (HOSPITAL_COMMUNITY): Payer: Medicaid Other | Admitting: Speech Pathology

## 2012-04-23 ENCOUNTER — Inpatient Hospital Stay (HOSPITAL_COMMUNITY): Payer: Medicaid Other | Admitting: *Deleted

## 2012-04-23 DIAGNOSIS — Z5189 Encounter for other specified aftercare: Secondary | ICD-10-CM

## 2012-04-23 DIAGNOSIS — S82899A Other fracture of unspecified lower leg, initial encounter for closed fracture: Secondary | ICD-10-CM

## 2012-04-23 DIAGNOSIS — S329XXA Fracture of unspecified parts of lumbosacral spine and pelvis, initial encounter for closed fracture: Secondary | ICD-10-CM

## 2012-04-23 DIAGNOSIS — S72009A Fracture of unspecified part of neck of unspecified femur, initial encounter for closed fracture: Secondary | ICD-10-CM

## 2012-04-23 DIAGNOSIS — S271XXA Traumatic hemothorax, initial encounter: Secondary | ICD-10-CM

## 2012-04-23 DIAGNOSIS — S069X9A Unspecified intracranial injury with loss of consciousness of unspecified duration, initial encounter: Secondary | ICD-10-CM

## 2012-04-23 LAB — COMPREHENSIVE METABOLIC PANEL
ALT: 21 U/L (ref 0–53)
AST: 17 U/L (ref 0–37)
Calcium: 8.3 mg/dL — ABNORMAL LOW (ref 8.4–10.5)
Creatinine, Ser: 0.87 mg/dL (ref 0.50–1.35)
GFR calc non Af Amer: >90 mL/min (ref >90)
Sodium: 143 mEq/L (ref 135–145)
Total Protein: 6.9 g/dL (ref 6.0–8.3)

## 2012-04-23 LAB — CLOSTRIDIUM DIFFICILE BY PCR: Toxigenic C. Difficile by PCR: NEGATIVE

## 2012-04-23 LAB — CULTURE, BLOOD (ROUTINE X 2): Culture: NO GROWTH

## 2012-04-23 LAB — CBC WITH DIFFERENTIAL/PLATELET
Basophils Absolute: 0 10*3/uL (ref 0.0–0.1)
Basophils Relative: 0 % (ref 0–1)
Eosinophils Absolute: 0.5 10*3/uL (ref 0.0–0.7)
Eosinophils Relative: 4 % (ref 0–5)
HCT: 33.3 % — ABNORMAL LOW (ref 39.0–52.0)
MCH: 27.6 pg (ref 26.0–34.0)
MCHC: 31.5 g/dL (ref 30.0–36.0)
MCV: 87.6 fL (ref 78.0–100.0)
Monocytes Absolute: 2 10*3/uL — ABNORMAL HIGH (ref 0.1–1.0)
Platelets: 535 10*3/uL — ABNORMAL HIGH (ref 150–400)
RDW: 17.8 % — ABNORMAL HIGH (ref 11.5–15.5)
WBC: 14 10*3/uL — ABNORMAL HIGH (ref 4.0–10.5)

## 2012-04-23 LAB — GLUCOSE, CAPILLARY
Glucose-Capillary: 100 mg/dL — ABNORMAL HIGH (ref 70–99)
Glucose-Capillary: 97 mg/dL (ref 70–99)
Glucose-Capillary: 89 mg/dL (ref 70–99)
Glucose-Capillary: 102 mg/dL — ABNORMAL HIGH (ref 70–99)

## 2012-04-23 MED ORDER — GABAPENTIN 100 MG PO CAPS
100.0000 mg | ORAL_CAPSULE | Freq: Three times a day (TID) | ORAL | Status: DC
  Administered 2012-04-23 – 2012-04-26 (×12): 100 mg via ORAL
  Filled 2012-04-23 (×17): qty 1

## 2012-04-23 MED ORDER — BOOST / RESOURCE BREEZE PO LIQD
1.0000 | Freq: Three times a day (TID) | ORAL | Status: DC
  Administered 2012-04-23 – 2012-05-04 (×26): 1 via ORAL

## 2012-04-23 MED ORDER — CLONIDINE HCL 0.1 MG PO TABS
0.1000 mg | ORAL_TABLET | Freq: Four times a day (QID) | ORAL | Status: DC | PRN
  Administered 2012-04-29: 0.1 mg via ORAL
  Filled 2012-04-23 (×2): qty 1

## 2012-04-23 MED ORDER — FENTANYL 25 MCG/HR TD PT72
25.0000 ug | MEDICATED_PATCH | TRANSDERMAL | Status: DC
  Administered 2012-04-23 – 2012-05-11 (×7): 25 ug via TRANSDERMAL
  Filled 2012-04-23 (×7): qty 1

## 2012-04-23 MED ORDER — LISINOPRIL 5 MG PO TABS
5.0000 mg | ORAL_TABLET | Freq: Two times a day (BID) | ORAL | Status: DC
  Administered 2012-04-23 – 2012-05-13 (×39): 5 mg via ORAL
  Filled 2012-04-23 (×43): qty 1

## 2012-04-23 MED ORDER — CALCIUM POLYCARBOPHIL 625 MG PO TABS
1250.0000 mg | ORAL_TABLET | Freq: Every day | ORAL | Status: DC
  Administered 2012-04-23 – 2012-04-24 (×2): 1250 mg via ORAL
  Filled 2012-04-23 (×3): qty 2

## 2012-04-23 MED ORDER — PRO-STAT SUGAR FREE PO LIQD
30.0000 mL | Freq: Three times a day (TID) | ORAL | Status: DC
  Administered 2012-04-24 – 2012-04-30 (×20): 30 mL via ORAL
  Filled 2012-04-23 (×27): qty 30

## 2012-04-23 MED ORDER — SACCHAROMYCES BOULARDII 250 MG PO CAPS
500.0000 mg | ORAL_CAPSULE | Freq: Two times a day (BID) | ORAL | Status: DC
  Administered 2012-04-23 – 2012-05-13 (×40): 500 mg via ORAL
  Filled 2012-04-23 (×44): qty 2

## 2012-04-23 NOTE — Progress Notes (Signed)
Pt's B/P at 1130 170/102. Pt had been turned, repositioned. 10/10 pain reported.  HRR at 98. Marissa Nestle PAC made aware;orders received. BP at 1245- 154/84. (Fentanyl patch placed)  Pt had 4 stools this shift, small to mod, loose.[name] PAC aware. Stool for C-diff sent ( neg)   Pt has been verbalizing toileting needs inconsistently.  Incontinent x 1 ( stool)  Foley patent. All incisions OTA, CDI with sutures. Chin laceration with small amount bleeding, dressing applied but pt. removes,monitor . Orientation fluctuates, see assessment FS. Carlean Purl

## 2012-04-23 NOTE — Plan of Care (Signed)
Problem: RH BLADDER ELIMINATION Goal: RH STG MANAGE BLADDER WITH EQUIPMENT WITH ASSISTANCE STG Manage Bladder With Equipment With minimal Assistance  Outcome: Not Progressing Foley catheter

## 2012-04-23 NOTE — Progress Notes (Signed)
Recreational Therapy Session Note  Patient Details  Name: Miguel Brady MRN: [id number] Date of Birth: 12-25-1960 Today's Date: 04/23/2012 Time: 06-1014 Pain: no c/o Skilled Therapeutic Interventions/Progress Updates: Leisure assessment initiated bed level.  Pt restless, difficult to understand, and had difficulty staying on topic.  Pt stated he had a BM, was distracted by "making a mess" in the bed, nursing informed and to address.  Full eval to follow. Alexius Hangartner 04/23/2012, 10:16 AM
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
## 2012-04-23 NOTE — Progress Notes (Signed)
Occupational Therapy Session Note  Patient Details  Name: Miguel Brady MRN: [id number] Date of Birth: 08-08-1961  Today's Date: 04/23/2012 Time: 8841-6606 Time Calculation (min): 15 min  Short Term Goals: Week 1:  OT Short Term Goal 1 (Week 1): Pt will tolerate sitting EOB with supervision without UE support for 5 min during funcitonal activity OT Short Term Goal 2 (Week 1): Pt will perform bed mobility to come to EOB for ADL task with mod A OT Short Term Goal 3 (Week 1): Pt will be introduced to HEP program for bilateral UE for strengthening OT Short Term Goal 4 (Week 1): Pt will transfer with slide board with total A +2 pt 45% OT Short Term Goal 5 (Week 1): Pt will perform grooming with setup  Skilled Therapeutic Interventions/Progress Updates:    1:1 Co-treat with SLP focusing on bed mobility to get to EOB with total A +2 (pt 45%), sitting balance at EOB with and without UE support during self feeding a snack of different textures- requiring min to mod A for sitting balance without UE support (pain with weight shifting onto left hip, adjusted left LE brace back to proper fit, return to supine with total A +2 (pt 10%). Pt with increased confabulation and language of confusion with orientation questions and conversation related to objective in rehab  Therapy Documentation Precautions:  Precautions Precautions: Fall Precaution Comments: WBAT on RLE for transfers only, no active ext on LLE Required Braces or Orthoses: Other Brace/Splint Other Brace/Splint: hinged knee brace in full extension Restrictions Weight Bearing Restrictions: Yes RLE Weight Bearing:  (WBAT for trasnfer only) LLE Weight Bearing: Non weight bearing Pain: Pain Assessment Pain Assessment: Faces Faces Pain Scale: Hurts whole lot Pain Type: Acute pain Pain Location:  (LLE, knee and ribs) Pain Intervention(s): Medication (See eMAR);RN made aware;Rest;Repositioned  See FIM for current functional  status  Therapy/Group: Individual Therapy and Co-Treatment  Roney Mans [facility] 04/23/2012, 2:36 PM
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
## 2012-04-24 ENCOUNTER — Inpatient Hospital Stay (HOSPITAL_COMMUNITY): Payer: Medicaid Other | Admitting: Physical Therapy

## 2012-04-24 ENCOUNTER — Inpatient Hospital Stay (HOSPITAL_COMMUNITY): Payer: Medicaid Other | Admitting: Occupational Therapy

## 2012-04-24 ENCOUNTER — Inpatient Hospital Stay (HOSPITAL_COMMUNITY): Payer: Medicaid Other | Admitting: Speech Pathology

## 2012-04-24 LAB — GLUCOSE, CAPILLARY: Glucose-Capillary: 112 mg/dL — ABNORMAL HIGH (ref 70–99)

## 2012-04-24 LAB — URINE CULTURE

## 2012-04-24 MED ORDER — DM-GUAIFENESIN ER 30-600 MG PO TB12
1.0000 | ORAL_TABLET | Freq: Two times a day (BID) | ORAL | Status: DC
  Administered 2012-04-24 – 2012-04-28 (×9): 1 via ORAL
  Administered 2012-04-28: 20:00:00 via ORAL
  Administered 2012-04-29 (×2): 1 via ORAL
  Administered 2012-04-30: 08:00:00 via ORAL
  Administered 2012-04-30 – 2012-05-12 (×25): 1 via ORAL
  Filled 2012-04-24 (×41): qty 1

## 2012-04-24 MED ORDER — CHOLESTYRAMINE 4 G PO PACK
4.0000 g | PACK | Freq: Every day | ORAL | Status: DC
  Administered 2012-04-24 – 2012-05-12 (×13): 4 g via ORAL
  Filled 2012-04-24 (×20): qty 1

## 2012-04-24 MED ORDER — CHOLESTYRAMINE 4 G PO PACK
4.0000 g | PACK | Freq: Every day | ORAL | Status: DC
  Filled 2012-04-24: qty 1

## 2012-04-24 MED ORDER — SODIUM CHLORIDE 0.9 % IJ SOLN
10.0000 mL | INTRAMUSCULAR | Status: DC | PRN
  Administered 2012-04-24 – 2012-04-27 (×5): 10 mL
  Administered 2012-04-27 – 2012-04-28 (×2): 20 mL
  Administered 2012-04-29: 10 mL
  Administered 2012-04-30: 30 mL

## 2012-04-24 MED ORDER — OXYCODONE HCL 5 MG PO TABS
15.0000 mg | ORAL_TABLET | ORAL | Status: DC | PRN
  Administered 2012-04-24: 20 mg via ORAL
  Administered 2012-04-25 – 2012-04-26 (×5): 15 mg via ORAL
  Administered 2012-04-27: 5 mg via ORAL
  Administered 2012-04-27: 15 mg via ORAL
  Administered 2012-04-28: 10 mg via ORAL
  Administered 2012-04-29 (×2): 15 mg via ORAL
  Administered 2012-04-29 (×2): 20 mg via ORAL
  Administered 2012-04-30 (×2): 5 mg via ORAL
  Administered 2012-04-30: 10 mg via ORAL
  Administered 2012-05-01 (×2): 15 mg via ORAL
  Administered 2012-05-01: 10 mg via ORAL
  Administered 2012-05-02 – 2012-05-05 (×9): 15 mg via ORAL
  Administered 2012-05-05: 20 mg via ORAL
  Administered 2012-05-05 – 2012-05-06 (×3): 15 mg via ORAL
  Administered 2012-05-06: 5 mg via ORAL
  Administered 2012-05-06 – 2012-05-07 (×6): 15 mg via ORAL
  Administered 2012-05-08 – 2012-05-09 (×3): 20 mg via ORAL
  Administered 2012-05-09 – 2012-05-10 (×2): 15 mg via ORAL
  Administered 2012-05-10: 20 mg via ORAL
  Administered 2012-05-11 – 2012-05-13 (×9): 15 mg via ORAL
  Filled 2012-04-24: qty 1
  Filled 2012-04-24 (×6): qty 3
  Filled 2012-04-24: qty 4
  Filled 2012-04-24 (×3): qty 3
  Filled 2012-04-24: qty 1
  Filled 2012-04-24 (×2): qty 3
  Filled 2012-04-24: qty 2
  Filled 2012-04-24: qty 3
  Filled 2012-04-24: qty 4
  Filled 2012-04-24 (×5): qty 3
  Filled 2012-04-24: qty 4
  Filled 2012-04-24: qty 1
  Filled 2012-04-24 (×3): qty 3
  Filled 2012-04-24: qty 4
  Filled 2012-04-24: qty 3
  Filled 2012-04-24: qty 2
  Filled 2012-04-24: qty 1
  Filled 2012-04-24: qty 3
  Filled 2012-04-24: qty 4
  Filled 2012-04-24: qty 3
  Filled 2012-04-24: qty 2
  Filled 2012-04-24 (×3): qty 3
  Filled 2012-04-24: qty 1
  Filled 2012-04-24 (×2): qty 3
  Filled 2012-04-24: qty 4
  Filled 2012-04-24: qty 3
  Filled 2012-04-24: qty 2
  Filled 2012-04-24: qty 4
  Filled 2012-04-24 (×2): qty 3
  Filled 2012-04-24: qty 4
  Filled 2012-04-24 (×2): qty 3
  Filled 2012-04-24: qty 1
  Filled 2012-04-24: qty 3
  Filled 2012-04-24: qty 2
  Filled 2012-04-24: qty 3
  Filled 2012-04-24: qty 4
  Filled 2012-04-24 (×2): qty 3
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
## 2012-04-24 NOTE — Progress Notes (Signed)
Speech Language Pathology Daily Session Notes  Patient Details  Name: Miguel Brady MRN: [id number] Date of Birth: 1960-09-23  Today's Date: 04/24/2012  Session 1 Time: 0930-1000 Time Calculation: 30 minutes   Session 2 Time: 1405-1500 Time Calculation (min): 55 min  Short Term Goals: Week 1: SLP Short Term Goal 1 (Week 1): Pt will sustain attention to task for ~15 minutes with Mod A verbal cues for redirection SLP Short Term Goal 2 (Week 1): Pt will maintain topic for ~4 turns during functional conversation with Min A verbal and question cues.  SLP Short Term Goal 3 (Week 1): Pt will identify 2 physical and 2 cognitive deficits with Mod A semanitc cues.  SLP Short Term Goal 4 (Week 1): Pt will demonstrate functional problem solving with max A verbal cues. SLP Short Term Goal 5 (Week 1): Pt will consume trials of Dys. 3 textures and demonstrate efficient mastication with supervision verbal cues.  SLP Short Term Goal 6 (Week 1): Pt will utilize a slow rate and increased vocal intensity to increase speech intelligibility at the sentence level with Min A verbal and semantic cues.   Skilled Therapeutic Interventions:  Session 1: Co-tx with OT with focus on sustained attention, problem solving, and proper positioning for PO intake and self-feeding. Pt required Mod verbal and question cues for sustained attention to task and for topic maintenance during functional conversation. Pt able to participate in conversation for ~2 turns but the required Mod semantic and questioning cues for redirection.  Pt consumed Dys. 3 textures and thin liquids with Min verbal cues to not speak with bolus in oral cavity and for small bites. Pt also required Mod A semantic cues for intellectual awareness of physical and cognitive deficits.    Session 2:  Treatment focus on selective attention and intellectual awareness. Pt required Mod verbal cues for topic maintenance and to decrease verbosity and tangents during  functional conversation. Pt demonstrated increased emergent awareness that use of comedy can be misinterpreted as confusion and that although humor is how he is coping with his current situation, he needs to stay on task. Pt reports decreased vision but was able to identify pictures without difficulty. Pt also requires Mod-Max A verbal cues for reasoning/insight into importance of PO intake to increase overall nutrition, healing and energy.   FIM:  Comprehension Comprehension Mode: Auditory Comprehension: 3-Understands basic 50 - 74% of the time/requires cueing 25 - 50%  of the time Expression Expression Mode: Verbal Expression: 3-Expresses basic 50 - 74% of the time/requires cueing 25 - 50% of the time. Needs to repeat parts of sentences. Social Interaction Social Interaction: 3-Interacts appropriately 50 - 74% of the time - May be physically or verbally inappropriate. Problem Solving Problem Solving: 1-Solves basic less than 25% of the time - needs direction nearly all the time or does not effectively solve problems and may need a restraint for safety Memory Memory: 2-Recognizes or recalls 25 - 49% of the time/requires cueing 51 - 75% of the time FIM - Eating Eating Activity: 5: Set-up assist for open containers  Pain Intermittent pain while throughout session, pt repositioned and RN aware and medications given   Therapy/Group:Co-Treatment and  Individual Therapy  Miguel Brady 04/24/2012, 3:43 PM

## 2012-04-24 NOTE — Progress Notes (Signed)
Physical Therapy Note  Patient Details  Name: Miguel Brady MRN: [id number] Date of Birth: 1961-02-06 Today's Date: 04/24/2012  Time 1: 1100-1155 55 minutes  1:1 Pt c/o LE pain, RN aware, pt medicated prior to session, repositioned as appropriate.  PROM performed to L LE per orders with pt able to tolerate with encouragement.  AROM R LE for strength and endurance.  Scooting edge of bed anterior/posterior and laterally with max-total A, cues for sequencing, safety, motor planning.  Sliding board transfer with +2 total assist.  Pt with disorganization of thoughts, decreased attention and awareness limiting participation in transfer.  Sit to supine with max A.  Pt able to assist scooting up in bed with B UEs using bed rails.  Pt requires frequent rests and frequent encouragement for participation.  Time 2:  1300-1400 (co tx with PT) 60 minutes  1:1 Pt c/o LE pain, RN made aware, repositioned as appropriate.  Pt requested to be able to move L LE himself, educated to use sheet to move LE around in bed, pt able to perform with min A.  Bed mobility with max A, pt requires mod-max cuing for motor planning and sequencing as well as attention to task due to internal distraction of pain.  Sliding board transfer with +2 total assist.  Pt with difficulty participating in transfer at this time due to decreased attention, problem solving and motor planning.  W/c set up and w/c mobility on unit.  Pt able to propel w/c with supervision for controlled environment, min A for obstacle negotiation.  Pt requires cuing for technique for w/c propulsion with B LEs.  Kinesiotape applied to L hand to decrease swelling.  Pt excited about being out of room in w/c.  Decreased perseveration on pain when distracted by activity.   DONAWERTH,KAREN 04/24/2012, 2:40 PM

## 2012-04-24 NOTE — Progress Notes (Signed)
  [facility]patient [facility] Individual Statement of Services  Patient Name:  Miguel Brady  Date:  04/24/2012  Welcome to the [facility].  Our goal is to provide you with an individualized program based on your diagnosis and situation, designed to meet your specific needs.  With this comprehensive rehabilitation program, you will be expected to participate in at least 3 hours of rehabilitation therapies Monday-Friday, with modified therapy programming on the weekends.  Your rehabilitation program will include the following services:  Physical Therapy (PT), Occupational Therapy (OT), Speech Therapy (ST), 24 hour per day rehabilitation nursing, Therapeutic Recreaction (TR), Neuropsychology, Case Management ( Social Worker), Rehabilitation Medicine, Nutrition Services and Pharmacy Services  Weekly team conferences will be held on Tuesdays to discuss your progress.  Your  Social Worker will talk with you frequently to get your input and to update you on team discussions.  Team conferences with you and your family in attendance may also be held.  Expected length of stay: 3-4 weeks  Overall anticipated outcome: supervision to minimal assistance  Depending on your progress and recovery, your program may change.  Your  Social Worker will coordinate services and will keep you informed of any changes.  Your RN Sports coach and SW names and contact numbers are listed  below.  The following services may also be recommended but are not provided by the [facility]:   Driving Evaluations  Home Health Rehabiltiation Services  Outpatient Rehabilitatation [facility]  Vocational Rehabilitation   Arrangements will be made to provide these services after discharge if needed.  Arrangements include referral to agencies that provide these services.  Your insurance has been verified to be:  Medicaid (pending) Your primary doctor is:  None  Pertinent information  will be shared with your doctor and your insurance company.   Social Worker:  [place], [place] [phone] or ([phone]  Information discussed with and copy given to patient by: Amada Jupiter, 04/24/2012, 12:43 PM

## 2012-04-24 NOTE — Progress Notes (Signed)
Occupational Therapy Session Note  Patient Details  Name: Miguel Brady MRN: [id number] Date of Birth: 1960/10/08  Today's Date: 04/24/2012 Time: 0900-0930 Time Calculation (min): 30 min  Short Term Goals: Week 1:  OT Short Term Goal 1 (Week 1): Pt will tolerate sitting EOB with supervision without UE support for 5 min during funcitonal activity OT Short Term Goal 2 (Week 1): Pt will perform bed mobility to come to EOB for ADL task with mod A OT Short Term Goal 3 (Week 1): Pt will be introduced to HEP program for bilateral UE for strengthening OT Short Term Goal 4 (Week 1): Pt will transfer with slide board with total A +2 pt 45% OT Short Term Goal 5 (Week 1): Pt will perform grooming with setup  Skilled Therapeutic Interventions/Progress Updates:    1:1 Co treat with SLP- self care retraining with focus on active participation in bed mobility to come to EOB, balance at EOB with and without UE support, slide board transfer to the right : bed to w/c with attention to his hand and right foor placement and weight shifts to unweight hips to move- a 3rd person helped assist with left LE placement. Mod A for attention to self feeding and to stay on topic in conversation. Oriented x 3. Pt still demonstrates some language of confusion and confabulation   Therapy Documentation Precautions:  Precautions Precautions: Fall Precaution Comments: WBAT on RLE for transfers only, no active ext on LLE Required Braces or Orthoses: Other Brace/Splint Other Brace/Splint: hinged knee brace in full extension Restrictions Weight Bearing Restrictions: Yes RLE Weight Bearing: Weight bearing as tolerated (transfers only) LLE Weight Bearing: Non weight bearing Pain: Pain Assessment Pain Assessment: 0-10 Pain Score: 10-Worst pain ever Pain Type: Acute pain Pain Location: Leg Pain Orientation: Left Pain Descriptors: Aching Pain Frequency: Constant Pain Onset: Gradual Patients Stated Pain Goal: 2 Pain  Intervention(s): Medication (See eMAR) (oxycodone 10mg  po)  See FIM for current functional status  Therapy/Group: Individual Therapy and Co-Treatment  Roney Mans [facility] 04/24/2012, 12:14 PM
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
## 2012-04-25 ENCOUNTER — Inpatient Hospital Stay (HOSPITAL_COMMUNITY): Payer: Medicaid Other | Admitting: Speech Pathology

## 2012-04-25 ENCOUNTER — Inpatient Hospital Stay (HOSPITAL_COMMUNITY): Payer: Medicaid Other | Admitting: Occupational Therapy

## 2012-04-25 ENCOUNTER — Inpatient Hospital Stay (HOSPITAL_COMMUNITY): Payer: Medicaid Other | Admitting: Physical Therapy

## 2012-04-25 LAB — GLUCOSE, CAPILLARY
Glucose-Capillary: 121 mg/dL — ABNORMAL HIGH (ref 70–99)
Glucose-Capillary: 137 mg/dL — ABNORMAL HIGH (ref 70–99)
Glucose-Capillary: 144 mg/dL — ABNORMAL HIGH (ref 70–99)

## 2012-04-25 NOTE — Progress Notes (Signed)
Occupational Therapy Session Note  Patient Details  Name: Miguel Brady MRN: [id number] Date of Birth: [birth year]  Today's Date: 04/25/2012 Time: 2841-3244 Time Calculation (min): 40 min  Short Term Goals: Week 1:  OT Short Term Goal 1 (Week 1): Pt will tolerate sitting EOB with supervision without UE support for 5 min during funcitonal activity OT Short Term Goal 2 (Week 1): Pt will perform bed mobility to come to EOB for ADL task with mod A OT Short Term Goal 3 (Week 1): Pt will be introduced to HEP program for bilateral UE for strengthening OT Short Term Goal 4 (Week 1): Pt will transfer with slide board with total A +2 pt 45% OT Short Term Goal 5 (Week 1): Pt will perform grooming with setup  Skilled Therapeutic Interventions/Progress Updates:    1:1 pt in bed when arrived- focus on bed mobility to come to EOB- pt motivated to do more independently this pm- with only assistance for left LE and to shift left hip forward towards the EOB; pt able to pull himself up into sitting without assistance for trunk. Sat EOB for 30 min and engaged in game of "Headbands" to work on attention, gnerating thoughts, staying on current topic, sitting balance, simple problem solving, concentration. Able to concentration for entire time until last of session- pt became hot, uncomfortable, irritable, poor frustration tolerance, requesting to lay down, took BP 143/88 HR 116 O2 on RA 97, returned to supine with total A +2 and helped adjust pt for comfort.  Therapy Documentation Precautions:  Precautions Precautions: Fall Precaution Comments: WBAT on RLE for transfers only, no active ext on LLE Required Braces or Orthoses: Other Brace/Splint Other Brace/Splint: hinged knee brace in full extension Restrictions Weight Bearing Restrictions: Yes RLE Weight Bearing: Weight bearing as tolerated (transfers only) LLE Weight Bearing: Non weight bearing Pain: Constant pain in left LE - distracting him at  times  See FIM for current functional status  Therapy/Group: Individual Therapy  Roney Mans [facility] 04/25/2012, 2:25 PM

## 2012-04-25 NOTE — Progress Notes (Signed)
Occupational Therapy Session Note  Patient Details  Name: Miguel Brady MRN: [id number] Date of Birth: Oct 09, 1960  Today's Date: 04/25/2012 Time: 1030-1130 Time Calculation (min): 60 min  Short Term Goals: Week 1:  OT Short Term Goal 1 (Week 1): Pt will tolerate sitting EOB with supervision without UE support for 5 min during funcitonal activity OT Short Term Goal 2 (Week 1): Pt will perform bed mobility to come to EOB for ADL task with mod A OT Short Term Goal 3 (Week 1): Pt will be introduced to HEP program for bilateral UE for strengthening OT Short Term Goal 4 (Week 1): Pt will transfer with slide board with total A +2 pt 45% OT Short Term Goal 5 (Week 1): Pt will perform grooming with setup  Skilled Therapeutic Interventions/Progress Updates:    1:1 Pt still with no clothes to don. Self care retraining with focus on bed mobility rolling to the right and left with total A +2 to perform bathing bottom and changing bed pad. Pt able to reach and perform hygiene with right hand in side lying with assistance to maintain side lying position. Bed mobility with max A +2 (assistance for left LE to come to EOB and to keep bed pain underneath of him), to come into sitting EOB. Pt continues to demonstrate dizziness with rolling to the left and coming into sitting. Able to sit EOB with left heel resting on the floor (instead of being elevated) with one UE support with distant supervision. Transferred via slide board with max A +2 bed to reclining w/c - pt was able to demonstrate increased participation in assisting with weight shifts and UE strength and maintaining right LE underneath of him with min cuing. Reclining w/c put into room by another therapist to try- however not a good fit for the pt. Transfered scoot pivot with max A +2 into recliner for increased comfort and to elevate LEs. Engaged in game "Spot It" addressing attention to task, concentration, processing speed, ability to listen and recall  rules of the game. Game modified to be closer to him to compensate for visual deficits. Pt able to demonstrate sustained attention in a quiet environment for 6 min and able to play game with min cuing. Pt needed a break at 6 min mark due to pain in left LE- adjusted for pain management and returned to the room.  Therapy Documentation Precautions:  Precautions Precautions: Fall Precaution Comments: WBAT on RLE for transfers only, no active ext on LLE Required Braces or Orthoses: Other Brace/Splint Other Brace/Splint: hinged knee brace in full extension Restrictions Weight Bearing Restrictions: Yes RLE Weight Bearing: Weight bearing as tolerated (transfers only) LLE Weight Bearing: Non weight bearing Pain: On going pain in left LE- RN aware and pt had pain meds prior to session.  See FIM for current functional status  Therapy/Group: Individual Therapy  Roney Mans [facility] 04/25/2012, 11:50 AM
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
## 2012-04-25 NOTE — Progress Notes (Signed)
Speech Language Pathology Daily Session Note  Patient Details  Name: Miguel Brady MRN: [id number] Date of Birth: 08/24/1961  Today's Date: 04/25/2012 Time: 1191-4782 Time Calculation (min): 45 min  Short Term Goals: Week 1: SLP Short Term Goal 1 (Week 1): Pt will sustain attention to task for ~15 minutes with Mod A verbal cues for redirection SLP Short Term Goal 2 (Week 1): Pt will maintain topic for ~4 turns during functional conversation with Min A verbal and question cues.  SLP Short Term Goal 3 (Week 1): Pt will identify 2 physical and 2 cognitive deficits with Mod A semanitc cues.  SLP Short Term Goal 4 (Week 1): Pt will demonstrate functional problem solving with max A verbal cues. SLP Short Term Goal 5 (Week 1): Pt will consume trials of Dys. 3 textures and demonstrate efficient mastication with supervision verbal cues.  SLP Short Term Goal 6 (Week 1): Pt will utilize a slow rate and increased vocal intensity to increase speech intelligibility at the sentence level with Min A verbal and semantic cues.   Skilled Therapeutic Interventions: Treatment focus on reading comprehension, reasoning and insight. Pt required Mod A questioning and semantic cues for insight/awareness into deficits and why some activities are not appropriate at this time do his physical and cognitive impairments (taking a shower) and needed Mod A verbal and questioning cues for topic maintenance throughout functional conversation. Pt required supervision verbal cues for reading comprehension/utilization of daily schedule and required Mod questioning cues for recall of therapy activities/clinicians.    FIM:  Comprehension Comprehension Mode: Auditory Comprehension: 3-Understands basic 50 - 74% of the time/requires cueing 25 - 50%  of the time Expression Expression Mode: Verbal Expression: 3-Expresses basic 50 - 74% of the time/requires cueing 25 - 50% of the time. Needs to repeat parts of sentences. Social  Interaction Social Interaction: 3-Interacts appropriately 50 - 74% of the time - May be physically or verbally inappropriate. Problem Solving Problem Solving: 2-Solves basic 25 - 49% of the time - needs direction more than half the time to initiate, plan or complete simple activities Memory Memory: 2-Recognizes or recalls 25 - 49% of the time/requires cueing 51 - 75% of the time FIM - Eating Eating Activity: 5: Set-up assist for open containers  Pain Intermittent pain throughout session   Therapy/Group: Individual Therapy  Anhthu Perdew 04/25/2012, 12:23 PM

## 2012-04-25 NOTE — Progress Notes (Signed)
Physical Therapy Note  Patient Details  Name: ROMY MCGUE MRN: [id number] Date of Birth: 1961/04/17 Today's Date: 04/25/2012  1300-1340 (40 minutes) individual Pain: no complaint of pain / pt reports increased pain (unrated) with passive assist LT LE during supine to sit Treatment: Therapeutic exercise- RT LE ankle pumps X20 ; AA RT heel slides X 20, SAQs X 20 RT; LT LE passive knee flexion as per orders; supine to sit assist max assist LT LE /min assist RT LE; pt able to obtain sitting using UEs X 5 minutes; sit to supine mod assist bilateral LEs. Bledsoe brace on LT LE/bed alarm applied.   Manon Banbury,JIM 04/25/2012, 1:46 PM

## 2012-04-26 ENCOUNTER — Inpatient Hospital Stay (HOSPITAL_COMMUNITY): Payer: Medicaid Other | Admitting: *Deleted

## 2012-04-26 LAB — GLUCOSE, CAPILLARY
Glucose-Capillary: 109 mg/dL — ABNORMAL HIGH (ref 70–99)
Glucose-Capillary: 106 mg/dL — ABNORMAL HIGH (ref 70–99)

## 2012-04-26 NOTE — Progress Notes (Signed)
Patient ID: Miguel Brady, male   DOB: 08/19/1961, 51 y.o.   MRN: [id number] Subjective/Complaints: Eating breakfast this morning. He states he slept well. No specific complaints today.   Objective: Vital Signs: Blood pressure 108/75, pulse 97, temperature 97.6 F (36.4 C), temperature source Oral, resp. rate 20, weight 282 lb 13.6 oz (128.3 kg), SpO2 92.00%. No results found for this basename: WBC:2,HGB:2,HCT:2,PLT:2 in the last 72 hours No results found for this basename: NA:2,K:2,CL:2,CO2:2,GLUCOSE:2,BUN:2,CREATININE:2,CALCIUM:2 in the last 72 hours CBG (last 3)   Basename 04/25/12 2058 04/25/12 1655 04/25/12 1145  GLUCAP 112* 144* 137*    Wt Readings from Last 3 Encounters:  04/26/12 282 lb 13.6 oz (128.3 kg)  04/22/12 296 lb 8.3 oz (134.5 kg)  04/22/12 296 lb 8.3 oz (134.5 kg)    Physical Exam:   Obese male in no acute distress. Chest clear to auscultation. Cardiac exam S1-S2 are regular. Abdominal exam obese, bowel sounds, soft. Extremities no significant edema. He does have a brace on his leg.  Assessment/Plan: 1. 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 Physiatrist is providing close team supervision and 24 hour management of active medical problems listed below. Physiatrist and rehab team continue to assess barriers to discharge/monitor patient progress toward functional and medical goals. FIM: FIM - Bathing Bathing Steps Patient Completed: Chest;Right Arm;Left Arm Bathing: 2: Max-Patient completes 3-4 5f 10 parts or 25-49%  FIM - Upper Body Dressing/Undressing Upper body dressing/undressing: 0: Wears gown/pajamas-no public clothing FIM - Lower Body Dressing/Undressing Lower body dressing/undressing: 0: Wears Oceanographer        FIM - Games developer Transfer: 1: Two helpers  FIM - Locomotion:  Wheelchair Locomotion: Wheelchair: 0: Activity did not occur (pt sat in recliner only) FIM - Locomotion: Ambulation Ambulation/Gait Assistance: Not tested (comment) Locomotion: Ambulation: 0: Activity did not occur  Comprehension Comprehension Mode: Auditory Comprehension: 3-Understands basic 50 - 74% of the time/requires cueing 25 - 50%  of the time  Expression Expression Mode: Verbal Expression: 3-Expresses basic 50 - 74% of the time/requires cueing 25 - 50% of the time. Needs to repeat parts of sentences.  Social Interaction Social Interaction: 3-Interacts appropriately 50 - 74% of the time - May be physically or verbally inappropriate.  Problem Solving Problem Solving Mode: Not assessed Problem Solving: 2-Solves basic 25 - 49% of the time - needs direction more than half the time to initiate, plan or complete simple activities  Memory Memory: 2-Recognizes or recalls 25 - 49% of the time/requires cueing 51 - 75% of the time Medical Problem List and Plan:  1. DVT Prophylaxis/Anticoagulation: Pharmaceutical: Lovenox  2. Pain Management: tylenol and oxycodone. 3. Mood: seroquel and klonopin for mood stabilization and To assist with sleep. Normalize sleep/wake cycle. Trazodone prn as well  4. Neuropsych: This patient is not capable of making decisions on his/her own behalf.  5. Serratia bronchitis: continue maxipime through 8/12 6. Leucocytosis: see above. Follow clinically, recheck UA,CX pending. Serial labs--no  Active signs of infection. 7. DM type 2: glucophage and sliding scale insulin. So far cbg's under good control  8. ABLA: follow serial cbc's. Multivitamin with iron.  9. 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 Is NWB LLE and WBAT RLE for transfer only   LOS (Days) 4 A FACE TO FACE EVALUATION WAS PERFORMED  Miguel Brady 04/26/2012, 9:06 AM

## 2012-04-26 NOTE — Progress Notes (Signed)
Physical Therapy Session Note  Patient Details  Name: Miguel Brady MRN: [id number] Date of Birth: 01/25/1961  Today's Date: 04/26/2012 Time:  - 1100-1155 (55 minutes)    Short Term Goals: Week 1:  PT Short Term Goal 1 (Week 1): Pt will demonstrate selective attention to seated functional task 10 minutes in min distractive environement with min cues PT Short Term Goal 2 (Week 1): Pt will propel w/c in controlled environement 50 ft min A PT Short Term Goal 3 (Week 1): Pt will roll to R mod A, L max A with rails  PT Short Term Goal 4 (Week 1): Pt will transfer supine to sit mod A  Skilled Therapeutic Interventions/Progress Updates: Bed mobility/transfer training     Therapy Documentation Precautions:  Precautions Precautions: Fall Precaution Comments: WBAT on RLE for transfers only, no active ext on LLE Required Braces or Orthoses: Other Brace/Splint Other Brace/Splint: hinged knee brace in full extension Restrictions Weight Bearing Restrictions: Yes RLE Weight Bearing: Weight bearing as tolerated (with transfers only) LLE Weight Bearing: Non weight bearing General: Pt in bed upon arrival   Vital Signs: Therapy Vitals Temp: 97.6 F (36.4 C) Temp src: Oral Pulse Rate: 97  Resp: 20  BP: 108/75 mmHg Patient Position, if appropriate: Lying Oxygen Therapy SpO2: 92 % O2 Device: None (Room air) Pain: Intermittent pain LT LE /unrated/premedicated   Mobility: supine to sit using bedrails mod/ max assist +1 bilateral LEs /trunk from flat bed; rolling max assist +2 (trunk/LEs) using rails;transfers - attempted sliding board transfer but unable to complete safely with +2 assist. Transfer completed total assist +2 Hoyer   Locomotion : wc mobility 50 feet X 2 controlled environment min/mod assist using bilateral UEs              See FIM for current functional status  Therapy/Group: Individual Therapy  Yazen Rosko,JIM 04/26/2012, 8:17 AM

## 2012-04-26 NOTE — Progress Notes (Signed)
Locked bledsoe brace to LLE. Bilateral heels elevated off bed. Foley with amber urine, with sediment, and malodorous. Oxy ir 15mg  given at 0150 for complaint of left leg pain. HR at HS 106 and regular. Alfredo Martinez A

## 2012-04-27 ENCOUNTER — Inpatient Hospital Stay (HOSPITAL_COMMUNITY): Payer: Medicaid Other | Admitting: Occupational Therapy

## 2012-04-27 ENCOUNTER — Inpatient Hospital Stay (HOSPITAL_COMMUNITY): Payer: Medicaid Other | Admitting: Speech Pathology

## 2012-04-27 ENCOUNTER — Inpatient Hospital Stay (HOSPITAL_COMMUNITY): Payer: Self-pay | Admitting: Physical Therapy

## 2012-04-27 ENCOUNTER — Inpatient Hospital Stay (HOSPITAL_COMMUNITY): Payer: Medicaid Other | Admitting: Physical Therapy

## 2012-04-27 DIAGNOSIS — S329XXA Fracture of unspecified parts of lumbosacral spine and pelvis, initial encounter for closed fracture: Secondary | ICD-10-CM

## 2012-04-27 DIAGNOSIS — S72009A Fracture of unspecified part of neck of unspecified femur, initial encounter for closed fracture: Secondary | ICD-10-CM

## 2012-04-27 DIAGNOSIS — S069X9A Unspecified intracranial injury with loss of consciousness of unspecified duration, initial encounter: Secondary | ICD-10-CM

## 2012-04-27 DIAGNOSIS — Z5189 Encounter for other specified aftercare: Secondary | ICD-10-CM

## 2012-04-27 DIAGNOSIS — S82899A Other fracture of unspecified lower leg, initial encounter for closed fracture: Secondary | ICD-10-CM

## 2012-04-27 LAB — BASIC METABOLIC PANEL
CO2: 24 mEq/L (ref 19–32)
Chloride: 103 mEq/L (ref 96–112)
Creatinine, Ser: 0.78 mg/dL (ref 0.50–1.35)
GFR calc Af Amer: >90 mL/min (ref >90)
Sodium: 137 mEq/L (ref 135–145)

## 2012-04-27 LAB — CBC WITH DIFFERENTIAL/PLATELET
Basophils Relative: 0 % (ref 0–1)
Eosinophils Absolute: 0.6 10*3/uL (ref 0.0–0.7)
Hemoglobin: 10.3 g/dL — ABNORMAL LOW (ref 13.0–17.0)
Lymphs Abs: 2.5 10*3/uL (ref 0.7–4.0)
MCH: 28.3 pg (ref 26.0–34.0)
MCHC: 31.1 g/dL (ref 30.0–36.0)
Monocytes Relative: 14 % — ABNORMAL HIGH (ref 3–12)
Neutro Abs: 8.2 10*3/uL — ABNORMAL HIGH (ref 1.7–7.7)
Neutrophils Relative %: 63 % (ref 43–77)
Platelets: 309 10*3/uL (ref 150–400)
RBC: 3.64 MIL/uL — ABNORMAL LOW (ref 4.22–5.81)

## 2012-04-27 LAB — GLUCOSE, CAPILLARY
Glucose-Capillary: 126 mg/dL — ABNORMAL HIGH (ref 70–99)
Glucose-Capillary: 130 mg/dL — ABNORMAL HIGH (ref 70–99)
Glucose-Capillary: 121 mg/dL — ABNORMAL HIGH (ref 70–99)
Glucose-Capillary: 114 mg/dL — ABNORMAL HIGH (ref 70–99)

## 2012-04-27 MED ORDER — GABAPENTIN 100 MG PO CAPS
200.0000 mg | ORAL_CAPSULE | Freq: Three times a day (TID) | ORAL | Status: DC
  Administered 2012-04-28 – 2012-05-06 (×22): 200 mg via ORAL
  Filled 2012-04-27 (×28): qty 2

## 2012-04-27 MED ORDER — NALOXONE HCL 0.4 MG/ML IJ SOLN: 0.4000 mg | INTRAMUSCULAR | Status: DC | PRN

## 2012-04-27 MED ORDER — SODIUM CHLORIDE 0.9 % IV SOLN
INTRAVENOUS | Status: DC
  Administered 2012-04-27: 11:00:00 via INTRAVENOUS
  Administered 2012-04-28: 100 mL/h via INTRAVENOUS

## 2012-04-27 MED ORDER — QUETIAPINE FUMARATE 50 MG PO TABS
50.0000 mg | ORAL_TABLET | Freq: Three times a day (TID) | ORAL | Status: DC
  Filled 2012-04-27 (×3): qty 1

## 2012-04-27 MED ORDER — QUETIAPINE FUMARATE 50 MG PO TABS
50.0000 mg | ORAL_TABLET | Freq: Three times a day (TID) | ORAL | Status: DC
  Administered 2012-04-28: 50 mg via ORAL
  Filled 2012-04-27 (×4): qty 1

## 2012-04-27 MED ORDER — GABAPENTIN 100 MG PO CAPS
200.0000 mg | ORAL_CAPSULE | Freq: Three times a day (TID) | ORAL | Status: DC
  Administered 2012-04-27: 200 mg via ORAL
  Filled 2012-04-27 (×4): qty 2

## 2012-04-27 NOTE — Progress Notes (Signed)
Speech Language Pathology Daily Session Note  Patient Details  Name: Miguel Brady MRN: [id number] Date of Birth: 1960-12-20  Today's Date: 04/27/2012 Time: 1000-1030 Time Calculation (min): 30 min  Short Term Goals: Week 1: SLP Short Term Goal 1 (Week 1): Pt will sustain attention to task for ~15 minutes with Mod A verbal cues for redirection SLP Short Term Goal 2 (Week 1): Pt will maintain topic for ~4 turns during functional conversation with Min A verbal and question cues.  SLP Short Term Goal 3 (Week 1): Pt will identify 2 physical and 2 cognitive deficits with Mod A semanitc cues.  SLP Short Term Goal 4 (Week 1): Pt will demonstrate functional problem solving with max A verbal cues. SLP Short Term Goal 5 (Week 1): Pt will consume trials of Dys. 3 textures and demonstrate efficient mastication with supervision verbal cues.  SLP Short Term Goal 6 (Week 1): Pt will utilize a slow rate and increased vocal intensity to increase speech intelligibility at the sentence level with Min A verbal and semantic cues.   Skilled Therapeutic Interventions: Co-tx with OT with treatment focus on attention, initiation, problem solving with functional tasks. Pt required Max encouragement for participation in treatment with hand over hand assist needed for pulling up his pants in supine. Pt demonstrating verbal agitation and frustration but required Max A verbal cues to utilize increased vocal intensity to increase intelligibility. Pt required Max A tactile and verbal cues for initiation of grooming and self-feeding tasks and sustained attention to task. RN made aware of pt's curren behavior and reports of fatigue and intermittent pain throughout the session.    FIM:  Comprehension Comprehension Mode: Auditory Comprehension: 3-Understands basic 50 - 74% of the time/requires cueing 25 - 50%  of the time Expression Expression Mode: Verbal Expression: 2-Expresses basic 25 - 49% of the time/requires cueing  50 - 75% of the time. Uses single words/gestures. Social Interaction Social Interaction: 3-Interacts appropriately 50 - 74% of the time - May be physically or verbally inappropriate. Problem Solving Problem Solving: 2-Solves basic 25 - 49% of the time - needs direction more than half the time to initiate, plan or complete simple activities Memory Memory: 2-Recognizes or recalls 25 - 49% of the time/requires cueing 51 - 75% of the time FIM - Eating Eating Activity: 5: Supervision/cues;4: Help with picking up utensils  Pain Pain Assessment Pain Assessment:  (unable to assess) Pain Score: 10-Worst pain ever Pain Type: Surgical pain Pain Location: Pelvis Pain Descriptors: Constant Pain Frequency: Constant Pain Onset: With Activity Pain Intervention(s): Medication (See eMAR) Multiple Pain Sites: No  Therapy/Group: Co-Treatment  Hally Colella 04/27/2012, 12:12 PM

## 2012-04-27 NOTE — Progress Notes (Signed)
Recreational Therapy Session Note  Patient Details  Name: Miguel Brady MRN: [id number] Date of Birth: 1961/07/23 Today's Date: 04/27/2012 Time:  562-130 Pain: no c/o Skilled Therapeutic Interventions/Progress Updates: attempted TR eval completion again today at bedside, pt difficult to engage due to lethargy.  Pt was arousable for brief periods of time and requesting water or diet soda, but then unable/unwilling to answer questions.  Pt's head of bed was raised and when alert took sips of water/diet soda by cup.  Pt required max instructional cues for small sips, instead of gulping from the cup.  Pt on HOLD for TR services, will monitor through team for future participation.   Mandrell Vangilder 04/27/2012, 11:51 AM

## 2012-04-27 NOTE — Progress Notes (Signed)
Speech Language Pathology Daily Session Note  Patient Details  Name: Miguel Brady MRN: [id number] Date of Birth: 30-Oct-1960  Today's Date: 04/27/2012 Time: 1430-1520 Time Calculation (min): 50 min  Short Term Goals: Week 1: SLP Short Term Goal 1 (Week 1): Pt will sustain attention to task for ~15 minutes with Mod A verbal cues for redirection SLP Short Term Goal 2 (Week 1): Pt will maintain topic for ~4 turns during functional conversation with Min A verbal and question cues.  SLP Short Term Goal 3 (Week 1): Pt will identify 2 physical and 2 cognitive deficits with Mod A semanitc cues.  SLP Short Term Goal 4 (Week 1): Pt will demonstrate functional problem solving with max A verbal cues. SLP Short Term Goal 5 (Week 1): Pt will consume trials of Dys. 3 textures and demonstrate efficient mastication with supervision verbal cues.  SLP Short Term Goal 6 (Week 1): Pt will utilize a slow rate and increased vocal intensity to increase speech intelligibility at the sentence level with Min A verbal and semantic cues.   Skilled Therapeutic Interventions: Treatment focus on participation, intellectual awareness and sustained attention. Pt required encouragement to participate and total A for verbal problem solving for reason for pain and fatigue.  Pt participated in self-feeding task with supervision verbal cues to complete task and Mod I for need/request soft grip for task. Pt consumed 3 bites of soup and reported it was "too much food."  Pt verbal sequenced step by step instruction for making a pasta dish with Min questioning cues.  Pt apologized for his behavior during our morning session.   FIM:  Comprehension Comprehension Mode: Auditory Comprehension: 3-Understands basic 50 - 74% of the time/requires cueing 25 - 50%  of the time Expression Expression Mode: Verbal Expression: 2-Expresses basic 25 - 49% of the time/requires cueing 50 - 75% of the time. Uses single words/gestures. Social  Interaction Social Interaction: 3-Interacts appropriately 50 - 74% of the time - May be physically or verbally inappropriate. Problem Solving Problem Solving: 2-Solves basic 25 - 49% of the time - needs direction more than half the time to initiate, plan or complete simple activities Memory Memory: 2-Recognizes or recalls 25 - 49% of the time/requires cueing 51 - 75% of the time FIM - Eating Eating Activity: 5: Supervision/cues;4: Help with picking up utensils  Pain Intermittent while moving in bed. Pt premedicated   Therapy/Group: Individual Therapy  Orazio Weller 04/27/2012, 3:25 PM

## 2012-04-27 NOTE — Progress Notes (Signed)
Patient ID: Miguel Brady, male   DOB: [birth year], 51 y.o.   MRN: [id number] Subjective/Complaints: L foot hurts "all over" A 12 point review of systems has been performed and if not noted above is otherwise negative.   Objective: Vital Signs: Blood pressure 117/66, pulse 101, temperature 98.3 F (36.8 C), temperature source Oral, resp. rate 20, weight 122.4 kg (269 lb 13.5 oz), SpO2 93.00%. No results found. No results found for this basename: WBC:2,HGB:2,HCT:2,PLT:2 in the last 72 hours No results found for this basename: NA:2,K:2,CL:2,CO2:2,GLUCOSE:2,BUN:2,CREATININE:2,CALCIUM:2 in the last 72 hours CBG (last 3)   Basename 04/26/12 2122 04/26/12 1650 04/26/12 1153  GLUCAP 136* 112* 106*    Wt Readings from Last 3 Encounters:  04/27/12 122.4 kg (269 lb 13.5 oz)  04/22/12 134.5 kg (296 lb 8.3 oz)  04/22/12 134.5 kg (296 lb 8.3 oz)    Physical Exam:   General: Alert and oriented x 3, No apparent distress.obese HEENT: Head is normocephalic, atraumatic, PERRLA, EOMI, sclera anicteric, oral mucosa pink and moist, dentition intact, ext ear canals clear,  Neck: Supple without JVD or lymphadenopathy Heart: Reg rate and rhythm. No murmurs rubs or gallops Chest: CTA bilaterally without wheezes, rales, or rhonchi; no distress Abdomen: Soft, non-tender, non-distended, bowel sounds positive. Extremities: No clubbing, cyanosis, or edema. Pulses are 2+ Skin: wounds generally clean and intact. Sutures over pelvis.L foot ecchymosis at lateral malleolar area, healed scabs dorsum of foot.1+ edema Neuro: alert, poor insight and awareness. Quite tangential and distracted, can confabulate. Able to follow simple one step commands. Cranial nerves 2-12 are intact. Sensory exam is normal except for left leg. Reflexes are 1 to 2+ in all 4's. Fine motor coordination is intact. No tremors. Motor function is grossly 4 in the UE. RLE is 2 prox to 4 distally. LLE not tested proximally, he has trace movement  in the foot (may be a pain component to it) Musculoskeletal: left leg in KI. Wrapped. Psych: Pt is impulsive. Frequently joking. Pt is cooperative    Assessment/Plan: 1. 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 Physiatrist is providing close team supervision and 24 hour management of active medical problems listed below. Physiatrist and rehab team continue to assess barriers to discharge/monitor patient progress toward functional and medical goals. FIM: FIM - Bathing Bathing Steps Patient Completed: Chest;Right Arm;Left Arm;Abdomen Bathing: 2: Max-Patient completes 3-4 12f 10 parts or 25-49%  FIM - Upper Body Dressing/Undressing Upper body dressing/undressing: 0: Wears gown/pajamas-no public clothing (no shirt) FIM - Lower Body Dressing/Undressing Lower body dressing/undressing: 0: Wears Oceanographer        FIM - Games developer Transfer: 1: Two helpers  FIM - Locomotion: Wheelchair Locomotion: Wheelchair: 0: Activity did not occur (pt sat in recliner only) FIM - Locomotion: Ambulation Ambulation/Gait Assistance: Not tested (comment) Locomotion: Ambulation: 0: Activity did not occur  Comprehension Comprehension Mode: Auditory Comprehension: 3-Understands basic 50 - 74% of the time/requires cueing 25 - 50%  of the time  Expression Expression Mode: Verbal Expression: 3-Expresses basic 50 - 74% of the time/requires cueing 25 - 50% of the time. Needs to repeat parts of sentences.  Social Interaction Social Interaction: 3-Interacts appropriately 50 - 74% of the time - May be physically or verbally inappropriate.  Problem Solving Problem Solving Mode: Not assessed Problem Solving: 2-Solves basic 25 - 49% of the time - needs direction more than half the time to initiate,  plan or complete simple activities  Memory Memory:  2-Recognizes or recalls 25 - 49% of the time/requires cueing 51 - 75% of the time Medical Problem List and Plan:  1. DVT Prophylaxis/Anticoagulation: Pharmaceutical: Lovenox  2. Pain Management: tylenol and oxycodone. Monitor for pain control with increased activity  -add neurontin for neuro pain left leg  -fentanyl also added 3. Mood: seroquel and klonopin for mood stabilization and To assist with sleep. Normalize sleep/wake cycle. Trazodone prn as well  4. Neuropsych: This patient is not capable of making decisions on his/her own behalf.  5. Serratia bronchitis: continue maxipime through 8/12 6. Leucocytosis: see above. Follow clinically, recheck UA,CX pending. Serial labs--no  Active signs of infection. 7. DM type 2: glucophage and sliding scale insulin. So far cbg's under good control  8. ABLA: follow serial cbc's. Multivitamin with iron.  9. 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 Is NWB LLE and WBAT RLE for transfer only 10.  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

## 2012-04-27 NOTE — Progress Notes (Signed)
Physical Therapy Note  Patient Details  Name: Miguel Brady MRN: [id number] Date of Birth: 08-26-1961 Today's Date: 04/27/2012  Time: 1100  Treatment canceled due to RN request to hold therapy as pt with decreased responsiveness, low BP, urine retention.   Hasan Douse 04/27/2012, 11:01 AM

## 2012-04-27 NOTE — Progress Notes (Signed)
Occupational Therapy Session Note  Patient Details  Name: Miguel Brady MRN: [id number] Date of Birth: 09-May-1961  Today's Date: 04/27/2012 Time: 0930-1000 Time Calculation (min): 30 min  Short Term Goals: Week 1:  OT Short Term Goal 1 (Week 1): Pt will tolerate sitting EOB with supervision without UE support for 5 min during funcitonal activity OT Short Term Goal 2 (Week 1): Pt will perform bed mobility to come to EOB for ADL task with mod A OT Short Term Goal 3 (Week 1): Pt will be introduced to HEP program for bilateral UE for strengthening OT Short Term Goal 4 (Week 1): Pt will transfer with slide board with total A +2 pt 45% OT Short Term Goal 5 (Week 1): Pt will perform grooming with setup  Skilled Therapeutic Interventions/Progress Updates:    Co-Tx with SLP with focus on attention, initiation, simple problem solving, sequencing, task organization with functional tasks- including bed mobility to don pants and for maxi move lift pad placement. Pt required max encouragement to participation in functional tasks with hand over hand cues for initiation and follow through for tasks of pulling pants up in bed, grooming at sink (toothbrushing) and self feeding tasks. Pt demonstrated poor frustration tolerance with verbal agitation and max cuing to use loud voice to vocalize needs and wants. Pt demonstrated sustained attention in a quiet environment with mod cuing. RN made aware of pt's current behavior and reports of fatigue and intermittent pain throughout the session.   Therapy Documentation Precautions:  Precautions Precautions: Fall Precaution Comments: WBAT on RLE for transfers only, no active ext on LLE Required Braces or Orthoses: Other Brace/Splint Other Brace/Splint: knee brace L locked in extension, no active knee extension but full ROM at hip Restrictions Weight Bearing Restrictions: Yes RLE Weight Bearing:  (WB for transfer only) LLE Weight Bearing: Non weight  bearing Pain:  ongoing pain in pelvis, rib cage area and left LE  See FIM for current functional status  Therapy/Group: Individual Therapy and Co-Treatment  Roney Mans [facility] 04/27/2012, 4:14 PM

## 2012-04-27 NOTE — Progress Notes (Signed)
C/o  Left ankle pain when moving LLE.  Bledsoe brace on and locked in extension. Decreased cognition. Foley in place with amber urine with sediment and malodorous. Encouraged cough and deep breathing. Refuses to turn related to pain in LLE. Only complains of pain with movement. Alfredo Martinez A

## 2012-04-27 NOTE — Progress Notes (Signed)
Physical Therapy Session Note  Patient Details  Name: Miguel Brady MRN: [id number] Date of Birth: [birth year]  Today's Date: 04/27/2012 Time: 0805-0900 Time Calculation (min): 55 min  Short Term Goals: Week 1:  PT Short Term Goal 1 (Week 1): Pt will demonstrate selective attention to seated functional task 10 minutes in min distractive environement with min cues PT Short Term Goal 2 (Week 1): Pt will propel w/c in controlled environement 50 ft min A PT Short Term Goal 3 (Week 1): Pt will roll to R mod A, L max A with rails  PT Short Term Goal 4 (Week 1): Pt will transfer supine to sit mod A  Skilled Therapeutic Interventions/Progress Updates:    Cotx with KD, PT. Pt stating he saw a man in the corner of his room, RN informed of hallucination.  Pt had pain meds at 6 but pain significantly limits his moement, RN informed and more pain meds were taken.  Therapeutic activity: pt sat EOB to drink and brush teeth to work on sitting balance and trunk control. Increased willingness to use L hand today and no c/o hand pain like last week. Pt tends to prop on RLE in sitting but did take toothpaste cap on/off with BLE, min A for balance d/t leaning posteriorly, pt would self correct with delay 90%.  When propped on RUE, close S for balance. P/ROM L hip abd/adduction x 10, passive stretch and ROM to calcaneous, toes and heel cord for ROM, A/AROM abd/add on RLE, PRAFO donned at end of session  Cognitive rehab for attention, memory, awareness Therapy Documentation Precautions:  Precautions Precautions: Fall Precaution Comments: WBAT on RLE for transfers only, no active ext on LLE Required Braces or Orthoses: Other Brace/Splint Other Brace/Splint: knee brace L locked in extension, no active knee extension but full ROM at hip Restrictions Weight Bearing Restrictions: Yes RLE Weight Bearing:  (WB for transfer only) LLE Weight Bearing: Non weight bearing Pain: Pain Assessment  Pain Score:  10-Worst pain ever Faces Pain Scale: Hurts whole lot (with movement) Pain Type: Surgical pain Pain Location: Pelvis Pain Descriptors: Constant Pain Frequency: Constant Pain Onset: With Activity Pain Intervention(s): Medication (See eMAR) Multiple Pain Sites: No Mobility: Bed Mobility Supine to Sit: 1: +2 Total assist;With rails Supine to Sit: Patient Percentage:  (25%) Supine to Sit Details (indicate cue type and reason): increased time, insturcional cues, A for LE's and trunk but improved active movment RLE Sitting - Scoot to Edge of Bed: Patient Percentage: 10% Sit to Supine: 1: +2 Total assist;With rail Sit to Supine: Patient Percentage: 30% Sit to Supine - Details (indicate cue type and reason): A for BLE and trunk but improved AROM RLE with encouragment Scooting to HOB:  (with bed on decline pulling on headboard) See FIM for current functional status  Therapy/Group: Individual Therapy  Michaelene Song 04/27/2012, 10:16 AM

## 2012-04-28 ENCOUNTER — Inpatient Hospital Stay (HOSPITAL_COMMUNITY): Payer: Medicaid Other | Admitting: Physical Therapy

## 2012-04-28 ENCOUNTER — Inpatient Hospital Stay (HOSPITAL_COMMUNITY): Payer: Medicaid Other | Admitting: Speech Pathology

## 2012-04-28 ENCOUNTER — Inpatient Hospital Stay (HOSPITAL_COMMUNITY): Payer: Medicaid Other | Admitting: Occupational Therapy

## 2012-04-28 LAB — GLUCOSE, CAPILLARY

## 2012-04-28 MED ORDER — QUETIAPINE FUMARATE 25 MG PO TABS
25.0000 mg | ORAL_TABLET | Freq: Three times a day (TID) | ORAL | Status: DC
  Administered 2012-04-28: 25 mg via ORAL
  Filled 2012-04-28 (×6): qty 1

## 2012-04-28 MED ORDER — CLONAZEPAM 0.5 MG PO TABS
1.0000 mg | ORAL_TABLET | Freq: Every day | ORAL | Status: DC
  Administered 2012-04-29 – 2012-05-12 (×14): 1 mg
  Filled 2012-04-28 (×14): qty 2

## 2012-04-28 NOTE — Progress Notes (Signed)
Speech Language Pathology Daily Session Note  Patient Details  Name: Miguel Brady MRN: [id number] Date of Birth: 1961/03/11  Today's Date: 04/28/2012 Time: 1300-1330 Time Calculation (min): 30 min  Short Term Goals: Week 1: SLP Short Term Goal 1 (Week 1): Pt will sustain attention to task for ~15 minutes with Mod A verbal cues for redirection SLP Short Term Goal 2 (Week 1): Pt will maintain topic for ~4 turns during functional conversation with Min A verbal and question cues.  SLP Short Term Goal 3 (Week 1): Pt will identify 2 physical and 2 cognitive deficits with Mod A semanitc cues.  SLP Short Term Goal 4 (Week 1): Pt will demonstrate functional problem solving with max A verbal cues. SLP Short Term Goal 5 (Week 1): Pt will consume trials of Dys. 3 textures and demonstrate efficient mastication with supervision verbal cues.  SLP Short Term Goal 6 (Week 1): Pt will utilize a slow rate and increased vocal intensity to increase speech intelligibility at the sentence level with Min A verbal and semantic cues.   Skilled Therapeutic Interventions: Co-treatment with fellow SLP; session focused on addressing sustained attention, recall and verbal communication.  SLP facilitated session with max assist semantic cues to buy into and sustain attention to a categorical generation task.  SLP also facilitated session with mod assist verbal cues to increase vocal intensity at the 1-2 word level of verbal expression.      FIM:  Comprehension Comprehension Mode: Auditory Comprehension: 3-Understands basic 50 - 74% of the time/requires cueing 25 - 50%  of the time Expression Expression Mode: Verbal Expression: 2-Expresses basic 25 - 49% of the time/requires cueing 50 - 75% of the time. Uses single words/gestures. Social Interaction Social Interaction: 2-Interacts appropriately 25 - 49% of time - Needs frequent redirection. Problem Solving Problem Solving: 2-Solves basic 25 - 49% of the time -  needs direction more than half the time to initiate, plan or complete simple activities Memory Memory: 2-Recognizes or recalls 25 - 49% of the time/requires cueing 51 - 75% of the time  Pain Pain Assessment Pain Assessment: No/denies pain  Therapy/Group: Individual Therapy  Miguel Brady., CCC-SLP [phone]  Miguel Brady 04/28/2012, 2:36 PM

## 2012-04-28 NOTE — Progress Notes (Signed)
Physical Therapy Note  Patient Details  Name: KUE FOX MRN: [id number] Date of Birth: 02-Oct-1960 Today's Date: 04/28/2012  Time: 750-830 (co tx with CS 815-830) 40 minutes  Pt c/o pain in L LE with sitting in w/c, able to lessen pain sitting in recliner.  RN aware of pt's pain.  Bed mobility with pt taking increased initiative to move his own body.  Improved initiation for supine to sit.  Seated edge of bed pt able to initiate contractions to scoot hips forward/backward, continues to require total A.  Seated balance activity with reaching with pt having difficulty keeping eyes focused on target, needing max cues for hand/eye coordination with reaching task.  Maxi move transfer to w/c with +2 assist.  Pt able to propel w/c with supervision in controlled environment, mod A for obstacle negotiation.  Pt with increased lability today, expresses concern over situation and feeling like an "invalid".  Provided emotional support.  Pt continues to be distracted by pain, anxiety with movement.  Limited attention limiting participation in treatment.  Individual therapy     Pretty Weltman 04/28/2012, 10:49 AM

## 2012-04-28 NOTE — Progress Notes (Signed)
Physical Therapy Note  Patient Details  Name: Miguel Brady MRN: [id number] Date of Birth: 02/18/1961 Today's Date: 04/28/2012  Time: 1100-1141 41 minutes  Pt c/o pain "all over" throughout treatment, RN aware. AAROM to R LE with focus on adduction to promote improved seated alignment in w/c.  Pt able to perform AAROM all motions on R LE with max cuing for attention to task.  Maxi move to bed with +2 total A.  Rolling to both sides in bed multiple attempts with total A, pt limited by decreased focused attention.  PROM performed to L LE within parameters.  Pt with continued heel cord tightness on L, PRAFO donned at end of session.  Pt with increased mumbling, lethargy this session, decreased arousal limiting functional participation.  Individual therapy   Cadence Minton 04/28/2012, 11:42 AM

## 2012-04-28 NOTE — Progress Notes (Signed)
Physical Therapy Session Note  Patient Details  Name: Miguel Brady MRN: [id number] Date of Birth: 1961/05/28  Today's Date: 04/28/2012 Time: 1478-2956 Time Calculation (min): 45 min  Short Term Goals: Week 1:  PT Short Term Goal 1 (Week 1): Pt will demonstrate selective attention to seated functional task 10 minutes in min distractive environement with min cues PT Short Term Goal 2 (Week 1): Pt will propel w/c in controlled environement 50 ft min A PT Short Term Goal 3 (Week 1): Pt will roll to R mod A, L max A with rails  PT Short Term Goal 4 (Week 1): Pt will transfer supine to sit mod A  Skilled Therapeutic Interventions/Progress Updates:    cotx with KD, PT for portion of session (until 9), individual tx 9-9:15  Pt not tolerating sitting up in w/c d/t c/o LLE pain despite multiple attempts to support it with external supports. Pt also tearful today verbalizing frustration with his situation and future and also stated "why I need to be up here on display?"  Therapeutic activity- pt transferred w/c to recliner with maximove assist of 3 persons where he was able to sit; more comfortable, RN present and aware of pt c/o pain, meds being given.  Therapy Documentation Precautions:  Precautions Precautions: Fall Precaution Comments: WBAT on RLE for transfers only, no active ext on LLE Required Braces or Orthoses: Other Brace/Splint Other Brace/Splint: knee brace L locked in extension, no active knee extension but full ROM at hip Restrictions Weight Bearing Restrictions: Yes RLE Weight Bearing: Weight bearing as tolerated LLE Weight Bearing: Non weight bearing See FIM for current functional status  Therapy/Group: Individual Therapy and cotreat  Michaelene Song 04/28/2012, 9:56 AM

## 2012-04-28 NOTE — Progress Notes (Signed)
Occupational Therapy Session Note  Patient Details  Name: Miguel Brady MRN: [id number] Date of Birth: 07/03/1961  Today's Date: 04/28/2012 Time: 0930-1000 Time Calculation (min): 30 min  Short Term Goals: Week 1:  OT Short Term Goal 1 (Week 1): Pt will tolerate sitting EOB with supervision without UE support for 5 min during funcitonal activity OT Short Term Goal 2 (Week 1): Pt will perform bed mobility to come to EOB for ADL task with mod A OT Short Term Goal 3 (Week 1): Pt will be introduced to HEP program for bilateral UE for strengthening OT Short Term Goal 4 (Week 1): Pt will transfer with slide board with total A +2 pt 45% OT Short Term Goal 5 (Week 1): Pt will perform grooming with setup  Skilled Therapeutic Interventions/Progress Updates:    1:1 CO-tx with SLP to focus on selective attention, alertness, awareness of deficits, simple functional problem solving in functional use of a computer to look up favorite recipes and get on facebook account. Pt able to verbalize using a computer was more challenging now due reported confusion/ decreased processing. Pt with increased mumbling, lethargy this session, decreased arousal limiting functional participation.   Therapy Documentation Precautions:  Precautions Precautions: Fall Precaution Comments: WBAT on RLE for transfers only, no active ext on LLE Required Braces or Orthoses: Other Brace/Splint Other Brace/Splint: knee brace L locked in extension, no active knee extension but full ROM at hip Restrictions Weight Bearing Restrictions: Yes RLE Weight Bearing: Weight bearing as tolerated LLE Weight Bearing: Non weight bearing Pain:  ongoing acute pain all over- RN aware  See FIM for current functional status  Therapy/Group: Individual Therapy and Co-Treatment  Roney Mans [facility] 04/28/2012, 12:09 PM

## 2012-04-28 NOTE — Progress Notes (Signed)
Speech Language Pathology Daily Session Note  Patient Details  Name: Miguel Brady MRN: [id number] Date of Birth: [birth year]  Today's Date: 04/28/2012 Time: 1300-1330 Time Calculation (min): 30 min  Short Term Goals: Week 1: SLP Short Term Goal 1 (Week 1): Pt will sustain attention to task for ~15 minutes with Mod A verbal cues for redirection SLP Short Term Goal 2 (Week 1): Pt will maintain topic for ~4 turns during functional conversation with Min A verbal and question cues.  SLP Short Term Goal 3 (Week 1): Pt will identify 2 physical and 2 cognitive deficits with Mod A semanitc cues.  SLP Short Term Goal 4 (Week 1): Pt will demonstrate functional problem solving with max A verbal cues. SLP Short Term Goal 5 (Week 1): Pt will consume trials of Dys. 3 textures and demonstrate efficient mastication with supervision verbal cues.  SLP Short Term Goal 6 (Week 1): Pt will utilize a slow rate and increased vocal intensity to increase speech intelligibility at the sentence level with Min A verbal and semantic cues.   Skilled Therapeutic Interventions: Co-treatment with fellow SLP; session focused on addressing sustained attention, recall and verbal communication. SLP facilitated session with max assist semantic cues to participate and sustain attention to a categorical generation task. SLP also facilitated session with mod assist verbal cues to increase vocal intensity at the 1-2 word level of verbal expression. Pt with increased emergent awareness of sustained attention to task and decreased topic maintenance.    FIM:  Comprehension Comprehension Mode: Auditory Comprehension: 3-Understands basic 50 - 74% of the time/requires cueing 25 - 50%  of the time Expression Expression Mode: Verbal Expression: 2-Expresses basic 25 - 49% of the time/requires cueing 50 - 75% of the time. Uses single words/gestures. Social Interaction Social Interaction: 2-Interacts appropriately 25 - 49% of time -  Needs frequent redirection. Problem Solving Problem Solving: 2-Solves basic 25 - 49% of the time - needs direction more than half the time to initiate, plan or complete simple activities Memory Memory: 2-Recognizes or recalls 25 - 49% of the time/requires cueing 51 - 75% of the time  Pain Pain Assessment Pain Assessment: No/denies pain  Therapy/Group: Co-Treatment   Tayna Smethurst 04/28/2012, 2:41 PM

## 2012-04-28 NOTE — Progress Notes (Signed)
Patient noted to be agitated throughout day and only arousable at times this am . Patient more alert this pm and ate 50% of meal with mother feeding him. Patient noted to be agitated with family at times . Patient would not verbalize what make him agitated but is more  Cooperative this pm .     Continue with plan of care .          Cleotilde Neer

## 2012-04-28 NOTE — Progress Notes (Signed)
Nutrition Follow-up/Consult  Intervention:   1. Encourage at meal times. Offer alternatives if patient refuses meals. 2. RD to continue to follow nutrition care plan  Assessment:   Upgraded to Dysphagia 3 diet on 8/9. Pt consumed 50% of lunch, however per nursing, it was noted that patient was lethargic earlier this afternoon. Noted pt refused breakfast tray, stated he is not a breakfast eater.  Pt stated he is in a lot of pain, and stated he doesn't want to take any medication for it.   When discussing his ongoing weight loss, pt states, "I haven't lost enough yet."   Diet Order:  Dysphagia 3 with thin liquids Supplements: Resource Breeze PO TID and  30 ml Prostat PO TID  Meds: Scheduled Meds:    . antiseptic oral rinse  15 mL Mouth Rinse QID  . chlorhexidine  15 mL Mouth Rinse BID  . cholestyramine  4 g Oral Q2000  . clonazePAM  1 mg Per Tube QHS  . dextromethorphan-guaiFENesin  1 tablet Oral BID  . enoxaparin (LOVENOX) injection  40 mg Subcutaneous Q24H  . feeding supplement  30 mL Oral TID WC  . feeding supplement  1 Container Oral TID BM  . fentaNYL  25 mcg Transdermal Q72H  . gabapentin  200 mg Oral TID  . insulin aspart  0-5 Units Subcutaneous QHS  . insulin aspart  0-9 Units Subcutaneous TID WC  . lisinopril  5 mg Oral BID  . metFORMIN  500 mg Oral BID WC  . metoprolol tartrate  25 mg Oral BID  . pantoprazole  40 mg Oral Q1200  . QUEtiapine  50 mg Oral TID  . saccharomyces boulardii  500 mg Oral BID  . DISCONTD: clonazePAM  1 mg Per Tube BID   Continuous Infusions:    . sodium chloride 100 mL/hr (04/28/12 1244)  . Gerhardt's butt cream     PRN Meds:.acetaminophen, albuterol, alum & mag hydroxide-simeth, bisacodyl, cloNIDine, diphenhydrAMINE, Gerhardt's butt cream, guaiFENesin-dextromethorphan, methocarbamol, naloxone (NARCAN) injection, ondansetron (ZOFRAN) IV, ondansetron, oxyCODONE, polyethylene glycol, prochlorperazine, prochlorperazine, prochlorperazine, sodium  chloride, traMADol, traZODone  Labs:  CMP     Component Value Date/Time   NA 137 04/27/2012 1755   K 3.8 04/27/2012 1755   CL 103 04/27/2012 1755   CO2 24 04/27/2012 1755   GLUCOSE 137* 04/27/2012 1755   BUN 21 04/27/2012 1755   CREATININE 0.78 04/27/2012 1755   CALCIUM 8.3* 04/27/2012 1755   PROT 6.9 04/23/2012 0605   ALBUMIN 2.5* 04/23/2012 0605   AST 17 04/23/2012 0605   ALT 21 04/23/2012 0605   ALKPHOS 332* 04/23/2012 0605   BILITOT 1.7* 04/23/2012 0605   GFRNONAA >90 04/27/2012 1755   GFRAA >90 04/27/2012 1755     Intake/Output Summary (Last 24 hours) at 04/28/12 1456 Last data filed at 04/28/12 1413  Gross per 24 hour  Intake     60 ml  Output   2650 ml  Net  -2590 ml  BM 8/12  Weight Status:  127.8 kg - wt continues to trend down  Estimated needs:  2400 - 2600 kcal, 135 - 150 grams protein  Nutrition Dx:  Increased nutrient needs r/t trauma, healing and recovery AEB estimated needs. Ongoing.  Goal: Pt to meet >/= 90% of their estimated nutrition needs; not met  Monitor:  Weights, labs, PO intake, I/O's  Miguel Motto MS, RD, LDN Pager: [phone] After-hours pager: [phone]

## 2012-04-28 NOTE — Progress Notes (Signed)
Patient ID: Miguel Brady, male   DOB: Nov 10, 1960, 51 y.o.   MRN: [id number] Subjective/Complaints: Pain ok this am but was somnolent after pain meds yest A 12 point review of systems has been performed and if not noted above is otherwise negative.   Objective: Vital Signs: Blood pressure 139/86, pulse 99, temperature 99 F (37.2 C), temperature source Oral, resp. rate 19, weight 127.8 kg (281 lb 12 oz), SpO2 94.00%. No results found.  Basename 04/27/12 1755  WBC 13.1*  HGB 10.3*  HCT 33.1*  PLT 309    Basename 04/27/12 1755  NA 137  K 3.8  CL 103  CO2 24  GLUCOSE 137*  BUN 21  CREATININE 0.78  CALCIUM 8.3*   CBG (last 3)   Basename 04/27/12 2053 04/27/12 1643 04/27/12 1105  GLUCAP 114* 121* 130*    Wt Readings from Last 3 Encounters:  04/28/12 127.8 kg (281 lb 12 oz)  04/22/12 134.5 kg (296 lb 8.3 oz)  04/22/12 134.5 kg (296 lb 8.3 oz)    Physical Exam:   General: Alert and oriented x 3, No apparent distress.obese HEENT: Head is normocephalic, atraumatic, PERRLA, EOMI, sclera anicteric, oral mucosa pink and moist, dentition intact, ext ear canals clear,  Neck: Supple without JVD or lymphadenopathy Heart: Reg rate and rhythm. No murmurs rubs or gallops Chest: CTA bilaterally without wheezes, rales, or rhonchi; no distress Abdomen: Soft, non-tender, non-distended, bowel sounds positive. Extremities: No clubbing, cyanosis, or edema. Pulses are 2+ Skin: wounds generally clean and intact. Sutures over pelvis.L foot ecchymosis at lateral malleolar area, healed scabs dorsum of foot.1+ edema Neuro: alert, poor insight and awareness. Quite tangential and distracted, can confabulate. Able to follow simple one step commands. Cranial nerves 2-12 are intact. Sensory exam is normal except for left leg. Reflexes are 1 to 2+ in all 4's. Fine motor coordination is intact. No tremors. Motor function is grossly 4 in the UE. RLE is 2 prox to 4 distally. LLE not tested proximally,  he has trace movement in the foot Musculoskeletal: left leg in KI. Wrapped. Psych: Pt is impulsive. Frequently joking. Pt is cooperative    Assessment/Plan: 1. 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 Physiatrist is providing close team supervision and 24 hour management of active medical problems listed below. Physiatrist and rehab team continue to assess barriers to discharge/monitor patient progress toward functional and medical goals. FIM: FIM - Bathing Bathing Steps Patient Completed: Chest;Right Arm;Left Arm;Abdomen Bathing: 2: Max-Patient completes 3-4 85f 10 parts or 25-49%  FIM - Upper Body Dressing/Undressing Upper body dressing/undressing: 0: Wears gown/pajamas-no public clothing (no shirt) FIM - Lower Body Dressing/Undressing Lower body dressing/undressing: 0: Wears gown/pajamas-no public clothing  FIM - Toileting Toileting: 1: Two helpers (per [employer], NT)     FIM - Games developer Transfer: 1: Two helpers  FIM - Locomotion: Wheelchair Locomotion: Wheelchair: 0: Activity did not occur (pt sat in recliner only) FIM - Locomotion: Ambulation Ambulation/Gait Assistance: Not tested (comment) Locomotion: Ambulation: 0: Activity did not occur  Comprehension Comprehension Mode: Auditory Comprehension: 3-Understands basic 50 - 74% of the time/requires cueing 25 - 50%  of the time  Expression Expression Mode: Verbal Expression: 2-Expresses basic 25 - 49% of the time/requires cueing 50 - 75% of the time. Uses single words/gestures.  Social Interaction Social Interaction: 3-Interacts appropriately 50 - 74% of the time - May be physically or verbally  inappropriate.  Problem Solving Problem Solving Mode: Not assessed Problem Solving: 2-Solves basic 25 - 49% of the time - needs direction more than half the time to initiate, plan or  complete simple activities  Memory Memory: 2-Recognizes or recalls 25 - 49% of the time/requires cueing 51 - 75% of the time Medical Problem List and Plan:  1. DVT Prophylaxis/Anticoagulation: Pharmaceutical: Lovenox  2. Pain Management: tylenol and oxycodone. Monitor for pain control with increased activity  -add neurontin for neuro pain left leg  -fentanyl also added 3. Mood: seroquel and klonopin for mood stabilization and To assist with sleep. Normalize sleep/wake cycle. Trazodone prn as well  4. Neuropsych: This patient is not capable of making decisions on his/her own behalf.  5. Serratia bronchitis: continue maxipime through 8/12 6. Leucocytosis: see above. Follow clinically, recheck UA,CX pending. Serial labs--no  Active signs of infection. 7. DM type 2: glucophage and sliding scale insulin. So far cbg's under good control  8. ABLA: follow serial cbc's. Multivitamin with iron.  9. 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 Is NWB LLE and WBAT RLE for transfer only 10.  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

## 2012-04-28 NOTE — Progress Notes (Signed)
Speech Language Pathology Daily Session Note  Patient Details  Name: Miguel Brady MRN: [id number] Date of Birth: [birth year]  Today's Date: 04/28/2012 Time: 1000-1030 Time Calculation (min): 30 min  Short Term Goals: Week 1: SLP Short Term Goal 1 (Week 1): Pt will sustain attention to task for ~15 minutes with Mod A verbal cues for redirection SLP Short Term Goal 2 (Week 1): Pt will maintain topic for ~4 turns during functional conversation with Min A verbal and question cues.  SLP Short Term Goal 3 (Week 1): Pt will identify 2 physical and 2 cognitive deficits with Mod A semanitc cues.  SLP Short Term Goal 4 (Week 1): Pt will demonstrate functional problem solving with max A verbal cues. SLP Short Term Goal 5 (Week 1): Pt will consume trials of Dys. 3 textures and demonstrate efficient mastication with supervision verbal cues.  SLP Short Term Goal 6 (Week 1): Pt will utilize a slow rate and increased vocal intensity to increase speech intelligibility at the sentence level with Min A verbal and semantic cues.   Skilled Therapeutic Interventions: Co-tx with OT with treatment focus on attention, participation, problem solving and functional communication. Pt required Max A verbal and question cues to utilize increased voice intensity and over articulation to increase overall speech intelligibility at the sentence level, suspect intelligibility impacted by pain and lethargy.  Pt required Mod A verbal cues for problem solving and participation of task for searching the internet for specific recipes. Pt also required Max A verbal and tactile cues for sustained attention to task.    FIM:  Comprehension Comprehension: 3-Understands basic 50 - 74% of the time/requires cueing 25 - 50%  of the time Expression Expression: 2-Expresses basic 25 - 49% of the time/requires cueing 50 - 75% of the time. Uses single words/gestures. Social Interaction Social Interaction: 2-Interacts appropriately 25 - 49%  of time - Needs frequent redirection. Problem Solving Problem Solving: 2-Solves basic 25 - 49% of the time - needs direction more than half the time to initiate, plan or complete simple activities Memory Memory: 2-Recognizes or recalls 25 - 49% of the time/requires cueing 51 - 75% of the time  Pain Reports intermittent pain throughout session, pt pre-medicated   Therapy/Group: Co-Treatment  Shani Fitch 04/28/2012, 12:18 PM

## 2012-04-29 ENCOUNTER — Inpatient Hospital Stay (HOSPITAL_COMMUNITY): Payer: Medicaid Other

## 2012-04-29 ENCOUNTER — Inpatient Hospital Stay (HOSPITAL_COMMUNITY): Payer: Medicaid Other | Admitting: Occupational Therapy

## 2012-04-29 ENCOUNTER — Inpatient Hospital Stay (HOSPITAL_COMMUNITY): Payer: Self-pay | Admitting: Speech Pathology

## 2012-04-29 ENCOUNTER — Inpatient Hospital Stay (HOSPITAL_COMMUNITY): Payer: Medicaid Other | Admitting: Physical Therapy

## 2012-04-29 LAB — GLUCOSE, CAPILLARY: Glucose-Capillary: 109 mg/dL — ABNORMAL HIGH (ref 70–99)

## 2012-04-29 LAB — CREATININE, SERUM
Creatinine, Ser: 0.6 mg/dL (ref 0.50–1.35)
GFR calc non Af Amer: >90 mL/min (ref >90)

## 2012-04-29 MED ORDER — QUETIAPINE FUMARATE 25 MG PO TABS
25.0000 mg | ORAL_TABLET | Freq: Two times a day (BID) | ORAL | Status: DC
  Administered 2012-04-30 – 2012-05-13 (×26): 25 mg via ORAL
  Filled 2012-04-29 (×33): qty 1
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
## 2012-04-29 NOTE — Progress Notes (Signed)
Physical Therapy Note  Patient Details  Name: Miguel Brady MRN: [id number] Date of Birth: [birth year] Today's Date: 04/29/2012  Time: 1035-1130 55 minutes  Pt c/o pelvic pain, agreeable to participate after 5 minute quiet rest.  Pt agreed, with max encouragement to PT treatment of sitting edge of bed x 30 minutes while participating in therapeutic activity.  Seated reaching activities with focus on core activation and improving posture.  Pt resistant to all activity stating "this is pointless".  Attempted to discuss with pt ideas that would motivate him, pt states "i hate everything".  Pt with episode of vomiting sitting edge of bed, RN made aware.  Pt able to participate after vomiting in scooting hips forward/backward and laterally with +2 assist, pt 25 %.  Pt with improved hip and core activation noted today.  Pt able to perform sit to supine while managing R LE on his own, +2 assist of trunk and L LE.  Pt limited by agitated behavior, impaired awareness of benefits of therapy.  Individual therapy   Norvella Loscalzo 04/29/2012, 12:05 PM

## 2012-04-29 NOTE — Progress Notes (Signed)
Physical Therapy Weekly Progress Note  Patient Details  Name: Miguel Brady MRN: [id number] Date of Birth: 06/04/1961  Today's Date: 04/29/2012  Patient has met 1 of 4 short term goals.  Pt is able to propel w/c with supervision-min A in controlled environments.  Pt is limited by cognitive deficits and pain that are limiting functional progress.  Pt continues to require total A for all functional mobility and requires a lift for safe transfers.  Patient continues to demonstrate the following deficits: impaired attention and awareness, increased pain, decreased strength, decreased activity tolerance, impaired reasoning, and therefore will continue to benefit from skilled PT intervention to enhance overall performance with activity tolerance, balance, postural control, ability to compensate for deficits, attention, awareness, coordination and knowledge of precautions.  Patient not progressing toward long term goals.  See goal revision..  Continue plan of care.  PT Short Term Goals Week 1:  PT Short Term Goal 1 (Week 1): Pt will demonstrate selective attention to seated functional task 10 minutes in min distractive environement with min cues PT Short Term Goal 1 - Progress (Week 1): Progressing toward goal PT Short Term Goal 2 (Week 1): Pt will propel w/c in controlled environement 50 ft min A PT Short Term Goal 2 - Progress (Week 1): Met PT Short Term Goal 3 (Week 1): Pt will roll to R mod A, L max A with rails  PT Short Term Goal 3 - Progress (Week 1): Progressing toward goal PT Short Term Goal 4 (Week 1): Pt will transfer supine to sit mod A PT Short Term Goal 4 - Progress (Week 1): Progressing toward goal Week 2:  PT Short Term Goal 1 (Week 2): Pt will attend to functional task in quiet environment x 2 minutes PT Short Term Goal 2 (Week 2): Pt will perform rolling in bed with max A PT Short Term Goal 3 (Week 2): Pt will assist 50% with transfers bed <> chair  Skilled Therapeutic  Interventions/Progress Updates:  Balance/vestibular training;Discharge planning;Cognitive remediation/compensation;Functional mobility training;Therapeutic Activities;UE/LE Coordination activities;Wheelchair propulsion/positioning;Patient/family Clinical research associate;Neuromuscular re-education;Therapeutic Exercise;UE/LE Strength taining/ROM;Splinting/orthotics;Pain management     See FIM for current functional status   DONAWERTH,KAREN 04/29/2012, 2:55 PM

## 2012-04-29 NOTE — Progress Notes (Signed)
Pt overall brighter affect today; more cooperative; mild irritation this AM; pleasant this afternoon and eve.  OOB to [facility] then recliner from 1700 - 1830 this eve.  Pain controlled with OXY IR, 20 mg this am,  15mg  PRN x2;  Other meds adjusted per orders, alert, less lethargy.  Did have large amt emesis late AM; c/o nausea, resolved without PRN. No further. Results of KUB to PAC.  Appetite poor.  Encouraged small, frequent meals,snacks.  Miguel Brady
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
## 2012-04-29 NOTE — Progress Notes (Signed)
Physical Therapy Note  Patient Details  Name: Miguel Brady MRN: [id number] Date of Birth: Feb 26, 1961 Today's Date: 04/29/2012  Time: 830-855 25 minutes  Pt c/o pain "everywhere", states "I don't know how I feel or what I'm doing".  Pt with improved participation in bed mobility for rolling and movement of R LE this morning.  Maxi move lift to recliner chair with pt without c/o discomfort seated in chair.  Pt with improved mobility but increased agitation, argumentative behavior when asked to participate in therapeutic activities.  Initiated LE exercises, then pt stated need to use restroom.  Attempted to assist pt to restroom, then pt became argumentative stating "I don't have to go anymore, I can't go anyway, I'll just wait until later".  Pt treatment limited by behavior this session.  Individual therapy   Olaf Mesa 04/29/2012, 8:56 AM
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
## 2012-04-29 NOTE — Progress Notes (Signed)
Occupational Therapy Session Note  Patient Details  Name: Miguel Brady MRN: [id number] Date of Birth: 08/27/1961  Today's Date: 04/29/2012 Time: 1300-1400 Time Calculation (min): 60 min  Short Term Goals: Week 1:  OT Short Term Goal 1 (Week 1): Pt will tolerate sitting EOB with supervision without UE support for 5 min during funcitonal activity OT Short Term Goal 2 (Week 1): Pt will perform bed mobility to come to EOB for ADL task with mod A OT Short Term Goal 3 (Week 1): Pt will be introduced to HEP program for bilateral UE for strengthening OT Short Term Goal 4 (Week 1): Pt will transfer with slide board with total A +2 pt 45% OT Short Term Goal 5 (Week 1): Pt will perform grooming with setup  Skilled Therapeutic Interventions/Progress Updates:  Patient in bed upon arrival and stated that he needed to get up on the [facility].  Patient was able to guide care ~80% of the session related to care of his LLE during mobility, how to place and replace Maxi Move sling, and how to maneuver the [employer].  Once BM complete on the HD drop arm commode, patient reported that he needed to learn how to wipe himself after BM then proceeded to try by reaching between his legs without success.  Patient able to scoot forward on commode without assistance to allow this clinician to complete hygiene.  Patient recommended that the bucket be removed to allow for complete cleaning.    Therapy Documentation Precautions:  Precautions Precautions: Fall Precaution Comments: WBAT on RLE for transfers only, no active ext on LLE Required Braces or Orthoses: Other Brace/Splint Other Brace/Splint: knee brace L locked in extension, no active knee extension but full ROM at hip Restrictions Weight Bearing Restrictions: Yes RLE Weight Bearing: Weight bearing as tolerated LLE Weight Bearing: Non weight bearing Pain: C/o left knee pain, not rated. See FIM for current functional status  Therapy/Group: Individual  Therapy  Nellene Courtois 04/29/2012, 3:51 PM

## 2012-04-30 ENCOUNTER — Inpatient Hospital Stay (HOSPITAL_COMMUNITY): Payer: Medicaid Other | Admitting: Occupational Therapy

## 2012-04-30 ENCOUNTER — Inpatient Hospital Stay (HOSPITAL_COMMUNITY): Payer: Medicaid Other | Admitting: Speech Pathology

## 2012-04-30 ENCOUNTER — Inpatient Hospital Stay (HOSPITAL_COMMUNITY): Payer: Medicaid Other | Admitting: Physical Therapy

## 2012-04-30 LAB — GLUCOSE, CAPILLARY
Glucose-Capillary: 135 mg/dL — ABNORMAL HIGH (ref 70–99)
Glucose-Capillary: 102 mg/dL — ABNORMAL HIGH (ref 70–99)
Glucose-Capillary: 132 mg/dL — ABNORMAL HIGH (ref 70–99)
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
## 2012-04-30 NOTE — Progress Notes (Signed)
Occupational Therapy Session Note  Patient Details  Name: Miguel Brady MRN: [id number] Date of Birth: [birth year]  Today's Date: 04/30/2012 Time: 1400 (Simultaneous filing. User may not have seen previous data.)-1430 (Simultaneous filing. User may not have seen previous data.) Time Calculation (min): 30 min (Simultaneous filing. User may not have seen previous data.)  Short Term Goals: Week 2:  OT Short Term Goal 1 (Week 2): Pt will be introduced to HEP program for bilateral UE for strengthening  OT Short Term Goal 2 (Week 2): Pt will perform slide board transfer mod A +2 (for safety) bed<>w/c OT Short Term Goal 3 (Week 2): Pt will don shirt EOB with supervision OT Short Term Goal 4 (Week 2): Pt will self feed with left hand all meals OT Short Term Goal 5 (Week 2): Pt will demonstrate selective attention for 15 min with min A  Skilled Therapeutic Interventions/Progress Updates:    1:1 Co-tx with physical therapy. Focus on bed mobility (rolling) to place lift pad underneath him for transfer to [name], emphasis on lateral leans for removal of lift pad, clothing management to pull pants up- (- pants pulled down in bed), leaning side to side into tall green chair next to him propped up with pillows, hygiene performed total A with bed pan removed cleaning up underneath 3:1, slide board transfer with mod A with +2 to hold and position left LE into recliner with mod A for scooting back into chair. Performed skin care to left LE with brace removed as PT perform ROM to hip, knee and ankle, ace wrapped left foot for swelling. Pt with positive attitude entire session and motivated. Pt able to follow directions with min cuing    Therapy Documentation Precautions:  Precautions Precautions: Fall Precaution Comments: WBAT on RLE for transfers only, no active ext on LLE Required Braces or Orthoses: Other Brace/Splint Other Brace/Splint: knee brace L locked in extension, no active knee extension but full  ROM at hip Restrictions Weight Bearing Restrictions: Yes RLE Weight Bearing: Weight bearing as tolerated LLE Weight Bearing: Non weight bearing Pain: Pain Assessment Pain Assessment: 0-10 Pain Score:   3 Pain Type: Acute pain Pain Location: Leg Pain Orientation: Left Pain Descriptors: Aching Pain Frequency: Occasional Pain Onset: Gradual Patients Stated Pain Goal: 2 Pain Intervention(s): Medication (See eMAR) (oxycodone 5mg  po)  See FIM for current functional status  Therapy/Group: Individual Therapy and Co-Treatment  Roney Mans [facility] 04/30/2012, 2:57 PM

## 2012-04-30 NOTE — Progress Notes (Signed)
Patient stated that he feels much better after taking Oxycodone IR 20 mg last night.  Patient also stated that he slept all through the night.

## 2012-04-30 NOTE — Progress Notes (Signed)
Social Work Patient ID: Miguel Brady, male   DOB: Feb 13, 1961, 51 y.o.   MRN: [id number]  Met yesterday morning with patient and mother to review team conference.  Mother concerned about pt's poor po intake and insists, "I'm gonna make him eat..."  Discussed progress to date, however, d/c plan continues to be SNF.  Arlissa Monteverde

## 2012-04-30 NOTE — Progress Notes (Signed)
Patient ID: GEVORG BRUM, male   DOB: 09/25/1960, 51 y.o.   MRN: [id number] Subjective/Complaints: Slept ok, back is sore,last KUB showed intact hardware across SI A 12 point review of systems has been performed and if not noted above is otherwise negative.   Objective: Vital Signs: Blood pressure 148/83, pulse 89, temperature 98.2 F (36.8 C), temperature source Oral, resp. rate 19, weight 127.4 kg (280 lb 13.9 oz), SpO2 94.00%. Dg Abd 1 View  04/29/2012  *RADIOLOGY REPORT*  Clinical Data: Abdominal pain, vomiting.  ABDOMEN - 1 VIEW  Comparison: None.  Findings: Extensive hardware in the pelvis, across the SI joints and pubic symphysis and within the left proximal femur.  No evidence of bowel obstruction.  No supine evidence of free air organomegaly.  IVC filter in place.  No suspicious calcification.  IMPRESSION: No evidence of bowel obstruction or free air.  No acute findings.  Original Report Authenticated By: Cyndie Chime, M.D.    Basename 04/27/12 1755  WBC 13.1*  HGB 10.3*  HCT 33.1*  PLT 309    Basename 04/29/12 0500 04/27/12 1755  NA -- 137  K -- 3.8  CL -- 103  CO2 -- 24  GLUCOSE -- 137*  BUN -- 21  CREATININE 0.60 0.78  CALCIUM -- 8.3*   CBG (last 3)   Basename 04/30/12 0003 04/29/12 1633 04/29/12 1149  GLUCAP 135* 112* 109*    Wt Readings from Last 3 Encounters:  04/29/12 127.4 kg (280 lb 13.9 oz)  04/22/12 134.5 kg (296 lb 8.3 oz)  04/22/12 134.5 kg (296 lb 8.3 oz)    Physical Exam:   General: Alert and oriented x 3, No apparent distress.obese HEENT: Head is normocephalic, atraumatic, PERRLA, EOMI, sclera anicteric, oral mucosa pink and moist, dentition intact, ext ear canals clear,  Neck: Supple without JVD or lymphadenopathy Heart: Reg rate and rhythm. No murmurs rubs or gallops Chest: CTA bilaterally without wheezes, rales, or rhonchi; no distress Abdomen: Soft, non-tender, non-distended, bowel sounds positive. Extremities: No clubbing,  cyanosis, or edema. Pulses are 2+ Skin: wounds generally clean and intact. Sutures over pelvis.L foot ecchymosis at lateral malleolar area, healed scabs dorsum of foot.1+ edema Neuro: alert, poor insight and awareness. Quite tangential and distracted, can confabulate. Able to follow simple one step commands. Cranial nerves 2-12 are intact. Sensory exam is normal except for left leg. Reflexes are 1 to 2+ in all 4's. Fine motor coordination is intact. No tremors. Motor function is grossly 4 in the UE. RLE is 2 prox to 4 distally. LLE not tested proximally, he has trace movement in the foot Musculoskeletal: left leg in KI. Wrapped. Psych: . Pt is cooperative but emotionally labile    Assessment/Plan: 1. 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 Physiatrist is providing close team supervision and 24 hour management of active medical problems listed below. Physiatrist and rehab team continue to assess barriers to discharge/monitor patient progress toward functional and medical goals. FIM: FIM - Bathing Bathing Steps Patient Completed: Chest;Right Arm;Left Arm;Abdomen Bathing: 2: Max-Patient completes 3-4 6f 10 parts or 25-49%  FIM - Upper Body Dressing/Undressing Upper body dressing/undressing: 0: Wears gown/pajamas-no public clothing (no shirt) FIM - Lower Body Dressing/Undressing Lower body dressing/undressing: 0: Wears gown/pajamas-no public clothing  FIM - Toileting Toileting: 1: Total-Patient completed zero steps, helper did all 3 (sitting on BSC/ only wearing a gown)  FIM - Toilet Transfers  Museum/gallery curator Devices: Psychiatrist Transfers: 1-Mechanical lift  FIM - Games developer Transfer: 1: Mechanical lift  FIM - Locomotion: Wheelchair Locomotion: Wheelchair: 2: Travels 50 - 149 ft with minimal assistance (Pt.>75%) FIM -  Locomotion: Ambulation Ambulation/Gait Assistance: Not tested (comment) Locomotion: Ambulation: 0: Activity did not occur  Comprehension Comprehension Mode: Auditory Comprehension: 3-Understands basic 50 - 74% of the time/requires cueing 25 - 50%  of the time  Expression Expression Mode: Verbal Expression: 3-Expresses basic 50 - 74% of the time/requires cueing 25 - 50% of the time. Needs to repeat parts of sentences.  Social Interaction Social Interaction: 2-Interacts appropriately 25 - 49% of time - Needs frequent redirection.  Problem Solving Problem Solving Mode: Not assessed Problem Solving: 3-Solves basic 50 - 74% of the time/requires cueing 25 - 49% of the time  Memory Memory: 3-Recognizes or recalls 50 - 74% of the time/requires cueing 25 - 49% of the time Medical Problem List and Plan:  1. DVT Prophylaxis/Anticoagulation: Pharmaceutical: Lovenox  2. Pain Management: tylenol and oxycodone. Monitor for pain control with increased activity  -add neurontin for neuro pain left leg  -fentanyl also added 3. Mood: seroquel at decresed dose and klonopin for mood stabilization and To assist with sleep. Normalize sleep/wake cycle. Trazodone prn as well .  Will need adjustment couseling 4. Neuropsych: This patient is not capable of making decisions on his/her own behalf.  5. Serratia bronchitis: resolved 6. Leucocytosis: see above. Follow clinically, --no  Active signs of infection. 7. DM type 2: glucophage and sliding scale insulin. So far cbg's under good control  8. ABLA: follow serial cbc's. Multivitamin with iron.  9. 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 Is NWB LLE and WBAT RLE  for transfer only 10.  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  Nausea-improved LOS (Days) 8 A FACE TO FACE EVALUATION WAS PERFORMED  KIRSTEINS,ANDREW E 04/30/2012, 6:51 AM

## 2012-04-30 NOTE — Progress Notes (Signed)
Physical Therapy Note  Patient Details  Name: Miguel Brady MRN: [id number] Date of Birth: October 24, 1960 Today's Date: 04/30/2012  Time: 1400-1456 56 minutes (co-tx with OT)  Pt c/o pain in pelvis and L LE with movement.  Treatment focused on functional transfers and mobility.  Pt continues with improved ability to actively participate in care.  Rolling with mod-max A.  Transfer to [facility] with maxi move.  Lateral leans with +2 assist to don pants in seated.  Sliding board transfer to [facility] to recliner with +2 assist for safety, pt > 50%.  Pt improving attention to task and awareness of deficits.  PROM to R LE within parameters, pt c/o swelling R foot, ace wrap applied.  Pt with pain throughout session but able to push through and participate.  Individual therapy     Conleigh Heinlein 04/30/2012, 2:57 PM

## 2012-04-30 NOTE — Progress Notes (Signed)
Physical Therapy Note  Patient Details  Name: Miguel Brady MRN: [id number] Date of Birth: October 14, 1960 Today's Date: 04/30/2012  Time: 800-900 (co-tx with DF) 60 minutes  Pt c/o L LE and pelvis pain with movement, positioned for comfort, RN aware.  Pt with much improved affect and behavior today, willing and eager to participate in therapy treatment.  Pt more active and participatory in bed mobility, rolling and sitting up to don pants, shirt and position pad for maxi move transfer.  Pt propelled w/c with supervision to kitchen and was able to come up with a list of ingredients he needs to make a casserole with min cuing.  Pt required +2 assist for all mobility but with pt performing > 25%.   Individual therapy   Sudie Bandel 04/30/2012, 12:19 PM

## 2012-04-30 NOTE — Progress Notes (Signed)
Speech Language Pathology Session & Weekly Progress Notes  Patient Details  Name: Miguel Brady MRN: [id number] Date of Birth: October 16, 1960  Today's Date: 04/30/2012 Session 1 Time: 1000-1030 Time Calculation (min): 30 min  Session 2 Time: 1300-1330 Time Calculation 30 minutes  Short Term Goals: Week 1: SLP Short Term Goal 1 (Week 1): Pt will sustain attention to task for ~15 minutes with Mod A verbal cues for redirection SLP Short Term Goal 1 - Progress (Week 1): Met SLP Short Term Goal 2 (Week 1): Pt will maintain topic for ~4 turns during functional conversation with Min A verbal and question cues.  SLP Short Term Goal 2 - Progress (Week 1): Not met SLP Short Term Goal 3 (Week 1): Pt will identify 2 physical and 2 cognitive deficits with Mod A semanitc cues.  SLP Short Term Goal 3 - Progress (Week 1): Met SLP Short Term Goal 4 (Week 1): Pt will demonstrate functional problem solving with max A verbal cues. SLP Short Term Goal 4 - Progress (Week 1): Met SLP Short Term Goal 5 (Week 1): Pt will consume trials of Dys. 3 textures and demonstrate efficient mastication with supervision verbal cues.  SLP Short Term Goal 5 - Progress (Week 1): Met SLP Short Term Goal 6 (Week 1): Pt will utilize a slow rate and increased vocal intensity to increase speech intelligibility at the sentence level with Min A verbal and semantic cues.  SLP Short Term Goal 6 - Progress (Week 1): Met  New Short Term Goals: Week 2: SLP Short Term Goal 1 (Week 2): Pt will demonstrate functional problem solving for basic and familiar tasks with Mod A verbal and visual cues.  SLP Short Term Goal 2 (Week 2): Pt will utilize slow rate and over articulation to increase speech intelligibility at the sentence level with supervision verbal cues.  SLP Short Term Goal 3 (Week 2): Pt will demonstrate topic maintenance for ~ 4 turns with Mod A verbal cues for redirection  SLP Short Term Goal 4 (Week 2): Pt will demonstrate  sustained attention to task for ~15 minutes with Min A verbal cues.   Weekly Progress Updates: Patient has made some functional gains and has met 5 of 6 short term goals this reporting period. Pt demonstrates increased activity tolerance but displays poor frustration tolerance with intermittent lability with certain tasks. Pt is demonstrating behaviors consistent with a Rancho Level V with intermittent confabulation and impaired  awareness, attention, memory, problem solving and delayed processing.  Pt would benefit from continued skilled SLP intervention to maximize cognitive function and overall independence to reduce care partner burden.   Daily Session Session 1: Co-treatment with OT with focus on selective attention, awareness of attitude, deficits and how they impact mobility / ADL, following 1-2 step directions, simple problem solving. Pt with increased participation today- participated in propelling self in w/c with mod cuing (tactile and verbal) crossing different thresholds, maneuvering around obstacles throughout the hospital, carpet and tiled floor. Pt demonstrated emergent awareness of goals/objectives for today's therapy, overall participation and decreased frustration tolerance with Min question and verbal cues. Treatment also focused on slide board transfers mat<>w/c with attention to weight shifts, position of hands and feet, helping verbalize needs for assistance for left LE. Slide board transfer mat to w/c with mod A with 2nd person only holding chair:) with mod cuing able to demonstrate reciprocal scooting to move back in chair.   Session 2: Treatment focus on topic maintenance during functional conversation. Pt required Min  question and verbal cues to self monitor tangential speech and verbosity throughout conversation and maintained topic for ~3 turns before he required cueing for redirection.   FIM:  Comprehension Comprehension Mode: Auditory Comprehension: 4-Understands basic 75 -  89% of the time/requires cueing 10 - 24% of the time Expression Expression Mode: Verbal Expression: 4-Expresses basic 75 - 89% of the time/requires cueing 10 - 24% of the time. Needs helper to occlude trach/needs to repeat words. Social Interaction Social Interaction: 3-Interacts appropriately 50 - 74% of the time - May be physically or verbally inappropriate. Problem Solving Problem Solving: 3-Solves basic 50 - 74% of the time/requires cueing 25 - 49% of the time Memory Memory: 3-Recognizes or recalls 50 - 74% of the time/requires cueing 25 - 49% of the time Pain Pain Assessment Pain Assessment: 0-10 Pain Score:   1 Pain Type: Acute pain Pain Location: Leg Pain Orientation: Left Pain Descriptors: Aching Pain Frequency: Occasional Pain Onset: Gradual Patients Stated Pain Goal: 2 Pain Intervention(s): Medication (See eMAR) (oxycodone 5mg  po)  Therapy/Group: Individual Therapy and Co-Treatment  Tariq Pernell 04/30/2012, 4:09 PM

## 2012-04-30 NOTE — Progress Notes (Signed)
Occupational Therapy Weekly Progress Note  Patient Details  Name: Miguel Brady MRN: [id number] Date of Birth: 07/28/1961  Today's Date: 04/30/2012 Time: 0930-1000 Time Calculation (min): 30 min  1:1 Co-treatment with SLP with focus on selective attention, awareness of attitude, deficits and how they impact mobility / ADL, following 1-2 step directions, simple problem solving. Pt with increased participation today- participate in propelling self in w/c with mod cuing (tactile and verbal) crossing different thresholds, manuvering around obstacles around the hospital, carpet and tiled floor, discussed goals/objectives for today's therapy, awareness of self and how he easily gets frustrated. Went back inside to gym to focus on slide board transfers mat<>w/c with attention to how to weight shifts, position of hands and feet, helping verbalize needs for assistance for left LE. Slide board transfer mat to w/c with mod A with 2nd person only holding chair:) with mod cuing able to demonstrate reciprocal scooting to move back in chair.  Patient has met 4 of 5 short term goals.  Pt continues to make steady gains in therapy since time of evaluation. Pt demonstrates increased activity tolerance and sitting tolerance. Pt is currently max to total A for mobility and basic ADL - including bathing, dressing and toileting. Pt with decreased swelling in left dominant hand and increased functional use of hand. Pt currently displays poor frustration tolerance, confabulation, lability, poor overal awareness of deficits and how they impact him and his functional status, decreased attention (easily distractible), decreased memory- all behaviors and cognition consistent with Rancho Level V. Nursing continues to perform bathing at night.  Patient continues to demonstrate the following deficits:muscle weakness and muscle joint tightness, decreased cardiorespiratoy endurance, decreased midline orientation, decreased  initiation, decreased attention, decreased awareness, decreased problem solving, decreased safety awareness, decreased memory and delayed processing and decreased balance strategies, difficulty maintaining precautions and acute pain below the waistand therefore will continue to benefit from skilled OT intervention to enhance overall performance with BADL and Reduce care partner burden.  Patient progressing toward long term goals..  Continue plan of care.  OT Short Term Goals Week 1:  OT Short Term Goal 1 (Week 1): Pt will tolerate sitting EOB with supervision without UE support for 5 min during funcitonal activity OT Short Term Goal 1 - Progress (Week 1): Met OT Short Term Goal 2 (Week 1): Pt will perform bed mobility to come to EOB for ADL task with mod A OT Short Term Goal 2 - Progress (Week 1): Met OT Short Term Goal 3 (Week 1): Pt will be introduced to HEP program for bilateral UE for strengthening OT Short Term Goal 3 - Progress (Week 1): Not met OT Short Term Goal 4 (Week 1): Pt will transfer with slide board with total A +2 pt 45% OT Short Term Goal 4 - Progress (Week 1): Met OT Short Term Goal 5 (Week 1): Pt will perform grooming with setup OT Short Term Goal 5 - Progress (Week 1): Met Week 2:  OT Short Term Goal 1 (Week 2): Pt will be introduced to HEP program for bilateral UE for strengthening  OT Short Term Goal 2 (Week 2): Pt will perform slide board transfer mod A +2 (for safety) bed<>w/c OT Short Term Goal 3 (Week 2): Pt will don shirt EOB with supervision OT Short Term Goal 4 (Week 2): Pt will self feed with left hand all meals OT Short Term Goal 5 (Week 2): Pt will demonstrate selective attention for 15 min with min A  Skilled Therapeutic Interventions/Progress  Updates:  Balance/vestibular training;Community reintegration;Discharge planning;Cognitive remediation/compensation;DME/adaptive equipment instruction;Functional mobility training;Patient/family education;Neuromuscular  re-education;Pain management;Self Care/advanced ADL retraining;Psychosocial support;Splinting/orthotics;Skin care/wound managment;Therapeutic Activities;Therapeutic Exercise;UE/LE Strength taining/ROM;UE/LE Coordination activities;Visual/perceptual remediation/compensation;Wheelchair propulsion/positioning   Therapy Documentation Precautions:  Precautions Precautions: Fall Precaution Comments: WBAT on RLE for transfers only, no active ext on LLE Required Braces or Orthoses: Other Brace/Splint Other Brace/Splint: knee brace L locked in extension, no active knee extension but full ROM at hip Restrictions Weight Bearing Restrictions: Yes RLE Weight Bearing: Weight bearing as tolerated LLE Weight Bearing: Non weight bearing Pain: Pain Assessment Pain Assessment: No/denies pain  See FIM for current functional status  Therapy/Group: Individual Therapy and Co-Treatment  Roney Mans [facility] 04/30/2012, 10:53 AM

## 2012-04-30 NOTE — Progress Notes (Signed)
Physical Therapy Session Note  Patient Details  Name: Miguel Brady MRN: [id number] Date of Birth: Jan 23, 1961  Today's Date: 04/30/2012 Time: 4098-1191 Time Calculation (min): 43 min  Short Term Goals: Week 2:  PT Short Term Goal 1 (Week 2): Pt will attend to functional task in quiet environment x 2 minutes PT Short Term Goal 2 (Week 2): Pt will perform rolling in bed with max A PT Short Term Goal 3 (Week 2): Pt will assist 50% with transfers bed <> chair  Skilled Therapeutic Interventions/Progress Updates:   Pt complaining with pain in LLE from positioning in w/c.  Made adjustments to w/c to make pt more comfortable.  Pt then requested to use the computer.  Pt was able to get onto computer and navigate facebook, sending a few messages without needing any assistance.  Pt continued to have pain in w/c having been up for 3 hours, requested to return to bed.  Used maxi move total @+2 to transfer pt back to bed.  Pt's attitude and motivation significantly changed from yesterday.   Therapy Documentation Precautions:  Precautions Precautions: Fall Precaution Comments: WBAT on RLE for transfers only, no active ext on LLE Required Braces or Orthoses: Other Brace/Splint Other Brace/Splint: knee brace L locked in extension, no active knee extension but full ROM at hip Restrictions Weight Bearing Restrictions: Yes RLE Weight Bearing: Weight bearing as tolerated LLE Weight Bearing: Non weight bearing Pain: Pain Assessment Pain Assessment: No/denies pain Pain Score:   3 Increased pain in LLE after 3+ hours up in w/c.    See FIM for current functional status  Therapy/Group: Individual Therapy  [name] 04/30/2012, 12:20 PM

## 2012-05-01 ENCOUNTER — Inpatient Hospital Stay (HOSPITAL_COMMUNITY): Payer: Medicaid Other | Admitting: Speech Pathology

## 2012-05-01 ENCOUNTER — Inpatient Hospital Stay (HOSPITAL_COMMUNITY): Payer: Medicaid Other | Admitting: Physical Therapy

## 2012-05-01 LAB — GLUCOSE, CAPILLARY
Glucose-Capillary: 102 mg/dL — ABNORMAL HIGH (ref 70–99)
Glucose-Capillary: 100 mg/dL — ABNORMAL HIGH (ref 70–99)

## 2012-05-01 NOTE — Progress Notes (Signed)
Physical Therapy Note  Patient Details  Name: KAIZER DISSINGER MRN: [id number] Date of Birth: March 14, 1961 Today's Date: 05/01/2012  Time: 758-857 59 minutes  Pt c/o L LE pain with movement, eased with rest, RN aware.  Treatment session focused on functional mobility in bed for dressing task and sliding board transfers bed <> w/c.  Pt with improved problem solving and mobility in bed moving  R LE with min assist and moving trunk and UEs without assist for rolling.  Pt requires mod A at trunk for leaning forward in bed to don shirt.  Pt mod A with supine to sit with bed rails requiring assist only to move L LE.  Sliding board transfer with mod A, +2 only for safety.  W/c mobility in controlled and household environments with supervision.  Pt with improved attention, behavior and awareness today.  Individual therapy  Jayton Popelka 05/01/2012, 8:58 AM

## 2012-05-01 NOTE — Progress Notes (Signed)
Occupational Therapy Note  Patient Details  Name: Miguel Brady MRN: [id number] Date of Birth: 13-Jun-1961 Today's Date: 05/01/2012   Time: 1000-1030 Cotreatment with SLP Pt c/o of discomfort while seated in w/c and increased pain in RLE with movement.  Co-treatment with SLP with focus on selective attention, awareness of attitude, deficits and how they impact mobility, following 1-2 step directions, simple problem solving. Pt in w/c and initially agreed to rolling to ADL kitchen to check on supplies/ingredients for cooking project for next week.  Pt exhibited difficulty rolling w/c straight and needed assistance correcting path of w/c to prevent rolling into objects/walls.  Pt c/o pain in groin/scrotum and pt transferred to bed to continue therapy.  Pt transferred w/c>bed with sliding board at tot A + 2 (mod/max A and 2nd person for safety).  Pt agreed to sit EOB to look through menu to make lunch and dinner choices.  Pt encouraged to appropriately instruct therapists with assisting pt in laying down in bed.  Pt exhibits difficulty with appropriately asking for assistance or directing care.    Lavone Neri [facility] 05/01/2012, 2:37 PM

## 2012-05-01 NOTE — Progress Notes (Signed)
Speech Language Pathology Daily Session Notes  Patient Details  Name: Miguel Brady MRN: [id number] Date of Birth: 03/23/1961  Today's Date: 05/01/2012  Session 1 Time: 1030-1100 Time Calculation: 30 minutes   Session 2 Time: 1300-1330 Time Calculation (min): 30 min  Short Term Goals: Week 2: SLP Short Term Goal 1 (Week 2): Pt will demonstrate functional problem solving for basic and familiar tasks with Mod A verbal and visual cues.  SLP Short Term Goal 2 (Week 2): Pt will utilize slow rate and over articulation to increase speech intelligibility at the sentence level with supervision verbal cues.  SLP Short Term Goal 3 (Week 2): Pt will demonstrate topic maintenance for ~ 4 turns with Mod A verbal cues for redirection  SLP Short Term Goal 4 (Week 2): Pt will demonstrate sustained attention to task for ~15 minutes with Min A verbal cues.   Skilled Therapeutic Interventions:  Session 1: Co-treatment with OT with focus on selective attention, awareness of attitude, deficits and how they impact mobility, following 1-2 step directions, simple problem solving. Pt in w/c and initially agreed to rolling to ADL kitchen to check on supplies/ingredients for cooking project for next week. Pt exhibited difficulty rolling w/c straight and needed assistance correcting path of w/c to prevent rolling into objects/walls. Pt c/o pain in groin/scrotum and pt transferred to bed to continue therapy. Pt transferred w/c>bed with sliding board at tot A + 2 (mod/max A and 2nd person for safety). Pt agreed to sit EOB to look through menu to make lunch and dinner choices. Pt required Mod verbal cues to locate items on menu and to utilize both upper extremities to increase ease with writing. Pt required Mod question and verbal cues to direct care and ask for assistance to decease pain and anxiety.   Session 2: Treatment focus on pt directing care and functional problem solving. Pt verbalized pain from his pants and  requested to remove them. Pt demonstrated efficient problem solving for how to complete task and performed bed mobility appropriately for clinician to assist. Pt also independently asked clinician to call his mother to request larger pants to decrease discomfort.   FIM:  Comprehension Comprehension Mode: Auditory Comprehension: 4-Understands basic 75 - 89% of the time/requires cueing 10 - 24% of the time Expression Expression Mode: Verbal Expression: 4-Expresses basic 75 - 89% of the time/requires cueing 10 - 24% of the time. Needs helper to occlude trach/needs to repeat words. Social Interaction Social Interaction: 3-Interacts appropriately 50 - 74% of the time - May be physically or verbally inappropriate. Problem Solving Problem Solving: 3-Solves basic 50 - 74% of the time/requires cueing 25 - 49% of the time Memory Memory: 3-Recognizes or recalls 50 - 74% of the time/requires cueing 25 - 49% of the time  Pain Intermittently with activity, pt repositioned and pre-medicated   Therapy/Group:Co-Treatment and  Individual Therapy  Ewin Rehberg 05/01/2012, 3:32 PM

## 2012-05-01 NOTE — Progress Notes (Signed)
Physical Therapy Note  Patient Details  Name: Miguel Brady MRN: [id number] Date of Birth: Jul 19, 1961 Today's Date: 05/01/2012  15:20-15:55 individual therapy pt stated he had minimal pain and was assisted back to bed   performed AAROM to left ankle and rt knee and hip internal rotation x 10 each in recliner. Pt requested to use lift back to bed and declined performing sliding board transfer.   Julian Reil 05/01/2012, 4:17 PM
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
## 2012-05-01 NOTE — Progress Notes (Signed)
Physical Therapy Note  Patient Details  Name: Miguel Brady MRN: [id number] Date of Birth: February 24, 1961 Today's Date: 05/01/2012  Time: 1400-1500 (co-tx with OT) 60 minutes  Pt c/o pain in L LE with movement, repositioned for comfort.  Treatment session focused on sustained/selective attention and following directions for safe mobility and transfers with lift.  Pt able to attend with min cuing to stay on task.  Transferred via maxi move to recliner chair.  Pt with increased lability this session when discussing reason for hospitalization and problem solving tasks for his future.  Pt continues with improved behavior and participation in therapy.  Individual therapy    Ioanna Colquhoun 05/01/2012, 3:57 PM

## 2012-05-01 NOTE — Progress Notes (Signed)
Pts BP per NT report 104/102; checked manually, 140/100. Pt with c/o pain, had just gotten back to bed after therapy.  Harvel Ricks pPac aware. Lisinopril dose scheduled given early. At 1730 b/p 140/90. Monitor.

## 2012-05-01 NOTE — Progress Notes (Signed)
Pt's foley d/c'd at 1200 noon; pt. voided 200cc at 1300, scan=0cc per NT report; voided 200cc within .,At 1600 voided 250cc, scan =32cc;   Has voided total of 950cc since foley d/c'd 200-250cc at a time.  Pt reluctant to take pain meds, requesting 5mg  only, then has refused therapy due to "pain."  Reviewed pain control r/t activity, therapy, options.Pts LBM 8/15.  All incision lines CDI with min scabbed areas LLE incision line; sutures abd, chest OTA, CDI; chin abrasion dry.Scalp laceration healed.  Memory deficits evident, orientation fluctuates, some confabulation evident.  Mild irritability this am, brighter affect this afternoon.  Appetite poor but increasing with frequent, smaller meals and snacks.  Taking fluids well as offered. See assessment FS for full assessment.
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
## 2012-05-02 ENCOUNTER — Inpatient Hospital Stay (HOSPITAL_COMMUNITY): Payer: Medicaid Other | Admitting: Occupational Therapy

## 2012-05-02 ENCOUNTER — Inpatient Hospital Stay (HOSPITAL_COMMUNITY): Payer: Medicaid Other | Admitting: Speech Pathology

## 2012-05-02 LAB — GLUCOSE, CAPILLARY
Glucose-Capillary: 120 mg/dL — ABNORMAL HIGH (ref 70–99)
Glucose-Capillary: 101 mg/dL — ABNORMAL HIGH (ref 70–99)

## 2012-05-02 NOTE — Progress Notes (Signed)
Occupational Therapy Session Note  Patient Details  Name: Miguel Brady MRN: [id number] Date of Birth: 02-20-1961  Today's Date: 05/02/2012 Time: 4098-1191 Time Calculation (min): 60 min  Skilled Therapeutic Interventions/Progress Updates: Patient stated too painful in L LE and pelvic area and irritated periarea following catheter removal yesterday and asked to stay in bed.  Completed endurance, bed mobility (minimal due to c/o L LE and pelvic pain) and UE exercises to increase bed mobility, overall conditioning and transfer status.  Brother present during therapy     Therapy Documentation Precautions:  Precautions Precautions: Fall Precaution Comments: WBAT on RLE for transfers only, no active ext on LLE Required Braces or Orthoses: Other Brace/Splint Other Brace/Splint: knee brace L locked in extension, no active knee extension but full ROM at hip Restrictions Weight Bearing Restrictions: Yes RLE Weight Bearing: Weight bearing as tolerated (transfers only) LLE Weight Bearing: Non weight bearing  Pain: "I am hurting so bad that I cannot let anybody move my leg from here." [ patient stated stitiches removed earlier today]  See FIM for current functional status  Therapy/Group: Individual Therapy  Bud Face [facility] 05/02/2012, 5:10 PM

## 2012-05-02 NOTE — Plan of Care (Signed)
Problem: RH BLADDER ELIMINATION Goal: RH STG MANAGE BLADDER WITH EQUIPMENT WITH ASSISTANCE STG Manage Bladder With Equipment With minimal Assistance  Outcome: Completed/Met Date Met:  05/02/12 Foley discontinue today 05/01/2012.

## 2012-05-02 NOTE — Progress Notes (Signed)
Sutures removed on left leg and left knee. Patient tolerated well. No bleeding during removal.

## 2012-05-02 NOTE — Plan of Care (Signed)
Problem: RH BLADDER ELIMINATION Goal: RH STG MANAGE BLADDER WITH ASSISTANCE Modified independent with urinal Outcome: Progressing Pt has voided several times since foley d/c'd in urinal and PVRs WNL thus far
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
## 2012-05-02 NOTE — Progress Notes (Signed)
Speech Language Pathology Daily Session Note  Patient Details  Name: Miguel Brady MRN: [id number] Date of Birth: [birth year]  Today's Date: 05/02/2012 Time: 0815-0900 Time Calculation (min): 45 min  Short Term Goals: Week 2: SLP Short Term Goal 1 (Week 2): Pt will demonstrate functional problem solving for basic and familiar tasks with Mod A verbal and visual cues.  SLP Short Term Goal 2 (Week 2): Pt will utilize slow rate and over articulation to increase speech intelligibility at the sentence level with supervision verbal cues.  SLP Short Term Goal 3 (Week 2): Pt will demonstrate topic maintenance for ~ 4 turns with Mod A verbal cues for redirection  SLP Short Term Goal 4 (Week 2): Pt will demonstrate sustained attention to task for ~15 minutes with Min A verbal cues.   Skilled Therapeutic Interventions: Treatment focused on addressing cognitive goals related to functional tasks, attention, awareness, problem solving, reasoning. Patient seen at breatkfast meal, and dysphagia goals were also addressed, specifically decreasing impulsivity. Patient declined any food or liquids on tray, and requested coffee, which he drank approximately 6 ounces with no overt s/s aspiration. He stated that at home, he typically ate one large meal at either lunch or dinner, but ate little for the rest of the day, "I've been trying to lose weight". Patient oriented to time, place and able to describe basic information surrounding accident, as well as physical impairments, after SLP provided context cue information. Patient recalled therapist's name without cues, and able to recall and describe daily and recent events, however selective attention was impaired, and he required frequent verbal redirection cues to initiate and maintain functional tasks, and maintain topic of conversation. Patient able to identify physical impairments, but was more focused on discomfort and was not able to clearly idenfity deficit impact  despite verbal cues to direct.   FIM:  Comprehension Comprehension Mode: Auditory Comprehension: 4-Understands basic 75 - 89% of the time/requires cueing 10 - 24% of the time Expression Expression Mode: Verbal Expression: 4-Expresses basic 75 - 89% of the time/requires cueing 10 - 24% of the time. Needs helper to occlude trach/needs to repeat words. Social Interaction Social Interaction: 3-Interacts appropriately 50 - 74% of the time - May be physically or verbally inappropriate. Problem Solving Problem Solving: 3-Solves basic 50 - 74% of the time/requires cueing 25 - 49% of the time Memory Memory: 3-Recognizes or recalls 50 - 74% of the time/requires cueing 25 - 49% of the time FIM - Eating Eating Activity: 5: Supervision/cues  Pain Pain Assessment Pain Assessment: 0-10 Pain Score: Asleep Pain Type: Surgical pain Pain Location: Leg Pain Orientation: Left Pain Descriptors: Aching Pain Onset: On-going Pain Intervention(s): Medication (See eMAR)  Therapy/Group: Individual Therapy  Pablo Lawrence 05/02/2012, 2:50 PM  Angela Nevin, MA, CCC-SLP [facility] Speech-Language Pathologist

## 2012-05-03 ENCOUNTER — Inpatient Hospital Stay (HOSPITAL_COMMUNITY): Payer: Medicaid Other | Admitting: *Deleted

## 2012-05-03 LAB — GLUCOSE, CAPILLARY
Glucose-Capillary: 113 mg/dL — ABNORMAL HIGH (ref 70–99)
Glucose-Capillary: 112 mg/dL — ABNORMAL HIGH (ref 70–99)

## 2012-05-03 MED ORDER — METOPROLOL TARTRATE 50 MG PO TABS
50.0000 mg | ORAL_TABLET | Freq: Two times a day (BID) | ORAL | Status: DC
  Administered 2012-05-03 – 2012-05-13 (×21): 50 mg via ORAL
  Filled 2012-05-03 (×24): qty 1

## 2012-05-03 NOTE — Progress Notes (Signed)
Patient tearful about situation and feeling "lonesome". Doesn't feel he's ready to go home, wants to be able to walk. Emotional support provided. Incontinent of urine once during the night, requiring complete linen change. Reported "i woke up and I was wet". LLE with brace, locked in extension. Left PRAFO boot in place. Complains of numbness to left thigh, not new per patient. At 0200 complained of pain to LLE, 3 prn oxy IR given per patient's request. Martina Sinner

## 2012-05-03 NOTE — Progress Notes (Signed)
Physical Therapy Session Note  Patient Details  Name: Miguel Brady MRN: [id number] Date of Birth: 1961-07-01  Today's Date: 05/03/2012 Time:  - 1100-1200 (60 minutes) individual    Short Term Goals: Week 1:  PT Short Term Goal 1 (Week 1): Pt will demonstrate selective attention to seated functional task 10 minutes in min distractive environement with min cues PT Short Term Goal 1 - Progress (Week 1): Progressing toward goal PT Short Term Goal 2 (Week 1): Pt will propel w/c in controlled environement 50 ft min A PT Short Term Goal 2 - Progress (Week 1): Met PT Short Term Goal 3 (Week 1): Pt will roll to R mod A, L max A with rails  PT Short Term Goal 3 - Progress (Week 1): Progressing toward goal PT Short Term Goal 4 (Week 1): Pt will transfer supine to sit mod A PT Short Term Goal 4 - Progress (Week 1): Progressing toward goal  Skilled Therapeutic Interventions/Progress Updates: Focus of treatment- bed mobility training; transfer training; wc mobility for general strengthening and increase activity tolerance     Therapy Documentation Precautions:  Precautions Precautions: Fall Precaution Comments: WBAT on RLE for transfers only, no active ext on LLE Required Braces or Orthoses: Other Brace/Splint Other Brace/Splint: knee brace L locked in extension, no active knee extension but full ROM at hip Restrictions Weight Bearing Restrictions: Yes RLE Weight Bearing: Weight bearing as tolerated (with transfers only.) LLE Weight Bearing: Non weight bearing General:  Pt in bed upon arrival  Vital Signs: Therapy Vitals Temp: 97.9 F (36.6 C) Temp src: Oral Pulse Rate: 90  Resp: 20  BP: 165/89 mmHg Patient Position, if appropriate: Lying Oxygen Therapy SpO2: 98 % O2 Device: None (Room air) Pain: fluctuating c/o of pain LT LE/ 5/10 to 8/10 during functional activities/ premedicated   Mobility: Rolling left min assist using bed rail; rolling right max assist including assist LT  LE and trunk; supine to sit mod /max assist LT LE and trunk; transfer sliding board max assist (pt 20%) +2   Locomotion : wc mobility - 150 feet SBA with increased time and several rest breaks               See FIM for current functional status  Therapy/Group: Individual Therapy  Darnel Mchan,JIM 05/03/2012, 8:32 AM

## 2012-05-03 NOTE — Progress Notes (Signed)
Patient ID: Miguel Brady, male   DOB: 11/30/1960, 51 y.o.   MRN: [id number] Subjective/Complaints: Slept well no pain issues A 12 point review of systems has been performed and if not noted above is otherwise negative.   Objective: Vital Signs: Blood pressure 165/89, pulse 90, temperature 97.9 F (36.6 C), temperature source Oral, resp. rate 20, weight 127.9 kg (281 lb 15.5 oz), SpO2 98.00%. No results found. No results found for this basename: WBC:2,HGB:2,HCT:2,PLT:2 in the last 72 hours No results found for this basename: NA:2,K:2,CL:2,CO2:2,GLUCOSE:2,BUN:2,CREATININE:2,CALCIUM:2 in the last 72 hours CBG (last 3)   Basename 05/02/12 2038 05/02/12 1712 05/02/12 1129  GLUCAP 101* 116* 120*    Wt Readings from Last 3 Encounters:  05/03/12 127.9 kg (281 lb 15.5 oz)  04/22/12 134.5 kg (296 lb 8.3 oz)  04/22/12 134.5 kg (296 lb 8.3 oz)    Physical Exam:   General: Alert and oriented x 3, No apparent distress.obese HEENT: Head is normocephalic, atraumatic, PERRLA, EOMI, sclera anicteric, oral mucosa pink and moist, dentition intact, ext ear canals clear,  Neck: Supple without JVD or lymphadenopathy Heart: Reg rate and rhythm. No murmurs rubs or gallops Chest: CTA bilaterally without wheezes, rales, or rhonchi; no distress Abdomen: Soft, non-tender, non-distended, bowel sounds positive. Extremities: No clubbing, cyanosis, or edema. Pulses are 2+.  Knee incision CDI sutures out Skin: wounds generally clean and intact.sutures at L knee and leg wound CDINeuro: alert, poor insight and awareness. . Able to follow simple one step commands. Cranial nerves 2-12 are intact. Sensory exam is normal except for left leg. Reflexes are 1 to 2+ in all 4's. Fine motor coordination is intact. No tremors. Motor function is grossly 4 in the UE. RLE is 2 prox to 4 distally. LLE not tested proximally, he has trace movement in the foot Musculoskeletal: left leg in KI. Wrapped. Psych: . Pt is cooperative      Assessment/Plan: 1. 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 Physiatrist is providing close team supervision and 24 hour management of active medical problems listed below. Physiatrist and rehab team continue to assess barriers to discharge/monitor patient progress toward functional and medical goals. FIM: FIM - Bathing Bathing Steps Patient Completed: Chest;Right Arm;Left Arm;Abdomen Bathing: 2: Max-Patient completes 3-4 103f 10 parts or 25-49%  FIM - Upper Body Dressing/Undressing Upper body dressing/undressing: 0: Wears gown/pajamas-no public clothing (no shirt) FIM - Lower Body Dressing/Undressing Lower body dressing/undressing: 0: Wears gown/pajamas-no public clothing  FIM - Toileting Toileting: 1: Two helpers  FIM - Diplomatic Services operational officer Devices: Psychiatrist Transfers: 0-Activity did not occur  FIM - Press photographer: 1: Two helpers  FIM - Locomotion: Printmaker: Wheelchair: 5: Travels 150 ft or more: maneuvers on rugs and over door sills with supervision, cueing or coaxing FIM - Locomotion: Ambulation Ambulation/Gait Assistance: Not tested (comment) Locomotion: Ambulation: 0: Activity did not occur  Comprehension Comprehension Mode: Auditory Comprehension: 4-Understands basic 75 - 89% of the time/requires cueing 10 - 24% of the time  Expression Expression Mode: Verbal Expression: 4-Expresses basic 75 - 89% of the time/requires cueing 10 - 24% of the time. Needs helper to occlude trach/needs to repeat words.  Social Interaction Social Interaction: 3-Interacts appropriately 50 - 74% of the time - May be physically or verbally inappropriate.  Problem Solving Problem Solving Mode: Not assessed Problem Solving: 3-Solves basic 50 - 74% of the time/requires  cueing 25 - 49% of the  time  Memory Memory: 1-Recognizes or recalls less than 25% of the time/requires cueing greater than 75% of the time Medical Problem List and Plan:  1. DVT Prophylaxis/Anticoagulation: Pharmaceutical: Lovenox  2. Pain Management: tylenol and oxycodone. Monitor for pain control with increased activity  -add neurontin for neuro pain left leg  -fentanyl also added 3. Mood: seroquel at decresed dose and klonopin for mood stabilization and To assist with sleep. Normalize sleep/wake cycle. Trazodone prn as well .  Will need adjustment couseling 4. Neuropsych: This patient is not capable of making decisions on his/her own behalf.  5. Serratia bronchitis: resolved 6. Leucocytosis: see above. Follow clinically, --no  Active signs of infection. 7. DM type 2: glucophage and sliding scale insulin. So far cbg's under good control  8. ABLA: follow serial cbc's. Multivitamin with iron.  9. 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 Is NWB LLE and WBAT RLE for transfer only 10.  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  HTN will increase metoprolol LOS (Days) 11 A FACE TO FACE EVALUATION WAS PERFORMED  KIRSTEINS,ANDREW E 05/03/2012, 6:44 AM

## 2012-05-04 ENCOUNTER — Inpatient Hospital Stay (HOSPITAL_COMMUNITY): Payer: Medicaid Other | Admitting: Occupational Therapy

## 2012-05-04 ENCOUNTER — Inpatient Hospital Stay (HOSPITAL_COMMUNITY): Payer: Medicaid Other | Admitting: Speech Pathology

## 2012-05-04 ENCOUNTER — Inpatient Hospital Stay (HOSPITAL_COMMUNITY): Payer: Medicaid Other | Admitting: Physical Therapy

## 2012-05-04 DIAGNOSIS — Z5189 Encounter for other specified aftercare: Secondary | ICD-10-CM

## 2012-05-04 DIAGNOSIS — S069X9A Unspecified intracranial injury with loss of consciousness of unspecified duration, initial encounter: Secondary | ICD-10-CM

## 2012-05-04 DIAGNOSIS — S329XXA Fracture of unspecified parts of lumbosacral spine and pelvis, initial encounter for closed fracture: Secondary | ICD-10-CM

## 2012-05-04 DIAGNOSIS — S82899A Other fracture of unspecified lower leg, initial encounter for closed fracture: Secondary | ICD-10-CM

## 2012-05-04 DIAGNOSIS — S72009A Fracture of unspecified part of neck of unspecified femur, initial encounter for closed fracture: Secondary | ICD-10-CM

## 2012-05-04 LAB — GLUCOSE, CAPILLARY
Glucose-Capillary: 113 mg/dL — ABNORMAL HIGH (ref 70–99)
Glucose-Capillary: 117 mg/dL — ABNORMAL HIGH (ref 70–99)

## 2012-05-04 NOTE — Progress Notes (Signed)
Occupational Therapy Session Note  Patient Details  Name: Miguel Brady MRN: [id number] Date of Birth: 1961/05/07  Today's Date: 05/04/2012 Time: 0900-0930 Time Calculation (min): 30 min  Short Term Goals: Week 2:  OT Short Term Goal 1 (Week 2): Pt will be introduced to HEP program for bilateral UE for strengthening  OT Short Term Goal 2 (Week 2): Pt will perform slide board transfer mod A +2 (for safety) bed<>w/c OT Short Term Goal 3 (Week 2): Pt will don shirt EOB with supervision OT Short Term Goal 4 (Week 2): Pt will self feed with left hand all meals OT Short Term Goal 5 (Week 2): Pt will demonstrate selective attention for 15 min with min A  Skilled Therapeutic Interventions/Progress Updates:    Co-tx with SLP with focus on bed mobility, bed>drop arm BSC sliding board transfer, UB bathing and dressing, and BSC>w/c transfer with sliding board.  Pt leaned to Rt to allow placement of sliding board and was able to lean to left while on BSC to allow therapist to pull up pants over Rt buttocks.  Pt performed transfers with tot A + 2 (for safety and holding BSC and w/c) with pt performing 75% of task.  Pt continues to require min verbal cues for redirection to task and encouragement to actively engage in therapies.  Pt responding well to encouragement.  Therapy Documentation Precautions:  Precautions Precautions: Fall Precaution Comments: WBAT on RLE for transfers only, no active ext on LLE Required Braces or Orthoses: Other Brace/Splint Other Brace/Splint: knee brace L locked in extension, no active knee extension but full ROM at hip Restrictions Weight Bearing Restrictions: Yes RLE Weight Bearing: Weight bearing as tolerated (with transfers only.) LLE Weight Bearing: Non weight bearing  See FIM for current functional status  Therapy/Group: Co-Treatment  Rich Brave 05/04/2012, 3:11 PM

## 2012-05-04 NOTE — Progress Notes (Signed)
Physical Therapy Note  Patient Details  Name: IBRAHEM VOLKMAN MRN: [id number] Date of Birth: [birth year] Today's Date: 05/04/2012  1300 1330 ( 30 minutes) (cotreat with OT for a total of 60 minutes) individual Pain : Intermittent pain reported LT LE with functional mobility/premedicated Focus of treatment: bed mobility training to improve independence; transfer training bed <>wc>mat Treatment: Pt in bed upon arrival ; switched wcs to improve seated posturing; requires encouragement to participate; supine to sit with assist LT LE and min to SBA trunk with tactile cues for hand placement ; transfer with sliding board to left setup + min/mod assist to assist LT LE and  assist to position hips in wc(second person for safety and manage LT LE); wc mobility room to gym 100 feet SBA with increased time; transfer wc<>mat with sliding board to right setup + min assist LT LE and vcs to lean head away from direction of travel.  1500 - 1555 ( 55 minutes) individual Pain : intermittent pain LT LE with functional mobility/premedicated Focus of treatment: transfer training; therapeutic exercise UEs to improve tolerance to activity Treatment: supine to sit (mat) min assist LT LE and vcs to use UEs to push up from mat; transfer with sliding board to left- min assist to assist LT LE; UE ergonometer approximately 7 minutes before c/o fatigue; wc to bed to right with sliding board setup + min assist LT LE ; sit to supine min assist LT LE. Pt requires + 2 assist to scoot up in bed.   Dashanna Kinnamon,JIM 05/04/2012, 4:22 PM

## 2012-05-04 NOTE — Progress Notes (Signed)
Patient ID: Miguel Brady, male   DOB: June 12, 1961, 51 y.o.   MRN: [id number] Patient ID: Miguel Brady, male   DOB: 1961/05/12, 51 y.o.   MRN: [id number] Subjective/Complaints: Slept well no pain issues A 12 point review of systems has been performed and if not noted above is otherwise negative.   Objective: Vital Signs: Blood pressure 138/90, pulse 101, temperature 98.6 F (37 C), temperature source Oral, resp. rate 20, weight 127.9 kg (281 lb 15.5 oz), SpO2 95.00%. No results found. No results found for this basename: WBC:2,HGB:2,HCT:2,PLT:2 in the last 72 hours No results found for this basename: NA:2,K:2,CL:2,CO2:2,GLUCOSE:2,BUN:2,CREATININE:2,CALCIUM:2 in the last 72 hours CBG (last 3)   Basename 05/04/12 0717 05/03/12 2100 05/03/12 1607  GLUCAP 113* 112* 93    Wt Readings from Last 3 Encounters:  05/04/12 127.9 kg (281 lb 15.5 oz)  04/22/12 134.5 kg (296 lb 8.3 oz)  04/22/12 134.5 kg (296 lb 8.3 oz)    Physical Exam:   General: Alert and oriented x 3, No apparent distress.obese HEENT: Head is normocephalic, atraumatic, PERRLA, EOMI, sclera anicteric, oral mucosa pink and moist, dentition intact, ext ear canals clear,  Neck: Supple without JVD or lymphadenopathy Heart: Reg rate and rhythm. No murmurs rubs or gallops Chest: CTA bilaterally without wheezes, rales, or rhonchi; no distress Abdomen: Soft, non-tender, non-distended, bowel sounds positive. Extremities: No clubbing, cyanosis, or edema. Pulses are 2+.  Knee incision CDI sutures out Skin: wounds generally clean and intact.sutures at L knee and leg wound CDINeuro: alert, poor insight and awareness. . Able to follow simple one step commands. Cranial nerves 2-12 are intact. Sensory exam is normal except for left leg. Reflexes are 1 to 2+ in all 4's. Fine motor coordination is intact. No tremors. Motor function is grossly 4 in the UE. RLE is 2 prox to 4 distally. LLE not tested proximally, he has trace movement in the  foot Musculoskeletal: left leg in KI. Wrapped. Psych: . Pt is cooperative     Assessment/Plan: 1. 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 Physiatrist is providing close team supervision and 24 hour management of active medical problems listed below. Physiatrist and rehab team continue to assess barriers to discharge/monitor patient progress toward functional and medical goals. FIM: FIM - Bathing Bathing Steps Patient Completed: Chest;Right Arm;Left Arm;Abdomen Bathing: 2: Max-Patient completes 3-4 54f 10 parts or 25-49%  FIM - Upper Body Dressing/Undressing Upper body dressing/undressing: 0: Wears gown/pajamas-no public clothing (no shirt) FIM - Lower Body Dressing/Undressing Lower body dressing/undressing: 0: Wears gown/pajamas-no public clothing  FIM - Toileting Toileting: 1: Two helpers  FIM - Diplomatic Services operational officer Devices: Psychiatrist Transfers: 0-Activity did not occur  FIM - Press photographer: 1: Two helpers  FIM - Locomotion: Printmaker: Wheelchair: 5: Travels 150 ft or more: maneuvers on rugs and over door sills with supervision, cueing or coaxing FIM - Locomotion: Ambulation Ambulation/Gait Assistance: Not tested (comment) Locomotion: Ambulation: 0: Activity did not occur  Comprehension Comprehension Mode: Auditory Comprehension: 4-Understands basic 75 - 89% of the time/requires cueing 10 - 24% of the time  Expression Expression Mode: Verbal Expression: 4-Expresses basic 75 - 89% of the time/requires cueing 10 - 24% of the time. Needs helper to occlude trach/needs to repeat words.  Social Interaction Social Interaction: 3-Interacts appropriately 50 - 74% of the time - May be physically or verbally inappropriate.  Problem  Solving Problem Solving Mode: Not assessed Problem  Solving: 3-Solves basic 50 - 74% of the time/requires cueing 25 - 49% of the time  Memory Memory: 1-Recognizes or recalls less than 25% of the time/requires cueing greater than 75% of the time Medical Problem List and Plan:  1. DVT Prophylaxis/Anticoagulation: Pharmaceutical: Lovenox  2. Pain Management: tylenol and oxycodone. Monitor for pain control with increased activity  -neurontin for neuro pain  -fentanyl also added 3. Mood: seroquel at decresed dose and klonopin for mood stabilization and To assist with sleep. . Trazodone prn as well .  Will need adjustment couseling 4. Neuropsych: This patient is not capable of making decisions on his/her own behalf.  5. Serratia bronchitis: resolved 6. Leucocytosis: see above. Follow clinically, --no  Active signs of infection. 7. DM type 2: glucophage and sliding scale insulin with reasonable control  8. ABLA: follow serial cbc's. Multivitamin with iron.  9. 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 Is NWB LLE and WBAT RLE for transfer only 10.  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  HTN will increase metoprolol LOS (Days) 12 A FACE TO FACE EVALUATION WAS PERFORMED  SWARTZ,ZACHARY T 05/04/2012, 7:39 AM

## 2012-05-04 NOTE — Progress Notes (Signed)
Speech Language Pathology Daily Session Note  Patient Details  Name: Miguel Brady MRN: [id number] Date of Birth: [birth year]  Today's Date: 05/04/2012 Time: 0930-1000 Time Calculation (min): 30 min  Short Term Goals: Week 2: SLP Short Term Goal 1 (Week 2): Pt will demonstrate functional problem solving for basic and familiar tasks with Mod A verbal and visual cues.  SLP Short Term Goal 2 (Week 2): Pt will utilize slow rate and over articulation to increase speech intelligibility at the sentence level with supervision verbal cues.  SLP Short Term Goal 3 (Week 2): Pt will demonstrate topic maintenance for ~ 4 turns with Mod A verbal cues for redirection  SLP Short Term Goal 4 (Week 2): Pt will demonstrate sustained attention to task for ~15 minutes with Min A verbal cues.   Skilled Therapeutic Interventions: Co-tx with OT with focus on emergent awareness, sustained attention and functional problem solving.  Pt required Min verbal cues for redirection to grooming tasks and required Mod A verbal and question cues for functional problem solving and emergent awareness of safety awareness and how to move his body during sliding board transfers.    FIM:  Comprehension Comprehension Mode: Auditory Comprehension: 4-Understands basic 75 - 89% of the time/requires cueing 10 - 24% of the time Expression Expression Mode: Verbal Expression: 4-Expresses basic 75 - 89% of the time/requires cueing 10 - 24% of the time. Needs helper to occlude trach/needs to repeat words. Social Interaction Social Interaction: 3-Interacts appropriately 50 - 74% of the time - May be physically or verbally inappropriate. Problem Solving Problem Solving: 3-Solves basic 50 - 74% of the time/requires cueing 25 - 49% of the time Memory Memory: 3-Recognizes or recalls 50 - 74% of the time/requires cueing 25 - 49% of the time  Pain Intermittently during transfers, pt pre-medicated   Therapy/Group: Co-treatment   Inika Bellanger,  Jesselee Poth 05/04/2012, 4:05 PM

## 2012-05-04 NOTE — Progress Notes (Signed)
Occupational Therapy Session Note  Patient Details  Name: Miguel Brady MRN: [id number] Date of Birth: June 11, 1961  Today's Date: 05/04/2012 Time: 1330-1400 Time Calculation (min): 30 min  Short Term Goals: Week 2:  OT Short Term Goal 1 (Week 2): Pt will be introduced to HEP program for bilateral UE for strengthening  OT Short Term Goal 2 (Week 2): Pt will perform slide board transfer mod A +2 (for safety) bed<>w/c OT Short Term Goal 3 (Week 2): Pt will don shirt EOB with supervision OT Short Term Goal 4 (Week 2): Pt will self feed with left hand all meals OT Short Term Goal 5 (Week 2): Pt will demonstrate selective attention for 15 min with min A  Skilled Therapeutic Interventions/Progress Updates:    1:1 Co-tx with Physical therapy. Focused on improved sitting system with different w/c with elevating leg rests, pt could continue to work on propelling  W/c, slide board transfers bed<w/c<mat with mod A with 2nd person holding w/c and board in place, attention to task , simple problem solving, expression of ideas, reviewed goals of therapy and rehab process.  Therapy Documentation Precautions:  Precautions Precautions: Fall Precaution Comments: WBAT on RLE for transfers only, no active ext on LLE Required Braces or Orthoses: Other Brace/Splint Other Brace/Splint: knee brace L locked in extension, no active knee extension but full ROM at hip Restrictions Weight Bearing Restrictions: Yes RLE Weight Bearing: Weight bearing as tolerated (with transfers only.) LLE Weight Bearing: Non weight bearing Pain: Pain Assessment Pain Assessment: 0-10 Pain Score:   5 Pain Type: Acute pain;Surgical pain Pain Location: Leg Pain Orientation: Left Pain Descriptors: Aching;Sore Pain Frequency: Occasional Pain Onset: Gradual Patients Stated Pain Goal: 2 Pain Intervention(s): Medication (See eMAR) (oxycodone 15mg  po)  See FIM for current functional status  Therapy/Group: Individual  Therapy  Roney Mans [facility] 05/04/2012, 2:45 PM

## 2012-05-04 NOTE — Progress Notes (Signed)
Physical Therapy Session Note  Patient Details  Name: Miguel Brady MRN: [id number] Date of Birth: 30-Oct-1960  Today's Date: 05/04/2012 Time: 7829-5621 Time Calculation (min): 60 min  Short Term Goals: Week 2:  PT Short Term Goal 1 (Week 2): Pt will attend to functional task in quiet environment x 2 minutes PT Short Term Goal 2 (Week 2): Pt will perform rolling in bed with max A PT Short Term Goal 3 (Week 2): Pt will assist 50% with transfers bed <> chair  Skilled Therapeutic Interventions/Progress Updates:  Pt up in w/c c/o LLE pain due to position.  Pt agreeable to getting out of w/c and onto mat.  See below for details, pt trying hard to work through pain and concerned about whether or not he was doing enough.  Therapy Documentation Precautions:  Precautions Precautions: Fall Precaution Comments: WBAT on RLE for transfers only, no active ext on LLE Required Braces or Orthoses: Other Brace/Splint Other Brace/Splint: knee brace L locked in extension, no active knee extension but full ROM at hip Restrictions Weight Bearing Restrictions: Yes RLE Weight Bearing: Weight bearing as tolerated (with transfers only.) LLE Weight Bearing: Non weight bearing Pain:  Pt with pain in multiple locations, requesting pain meds at end of session to calm pain down and to return to bed. Mobility:  Sliding board transfers with total@+2, pt=60%, pt using UEs to lift bottom and slide over, w/c to mat.  Sit to supine with mod@, and supine to sit with max@.  Pt returned to bed at end of session with maxi-move. Exercises:  Supine exercises:  With LLE on pillow gentle hip AB/ADDuction.  RLE ankle pumps, heel slides, hip AB/ADduction, and quad sets.  All AAROM, 10 reps each.  See FIM for current functional status  Therapy/Group: Individual Therapy  [name] 05/04/2012, 3:46 PM

## 2012-05-05 ENCOUNTER — Inpatient Hospital Stay (HOSPITAL_COMMUNITY): Payer: Medicaid Other | Admitting: Physical Therapy

## 2012-05-05 ENCOUNTER — Inpatient Hospital Stay (HOSPITAL_COMMUNITY): Payer: Medicaid Other | Admitting: Occupational Therapy

## 2012-05-05 ENCOUNTER — Inpatient Hospital Stay (HOSPITAL_COMMUNITY): Payer: Medicaid Other | Admitting: Speech Pathology

## 2012-05-05 LAB — GLUCOSE, CAPILLARY
Glucose-Capillary: 98 mg/dL (ref 70–99)
Glucose-Capillary: 109 mg/dL — ABNORMAL HIGH (ref 70–99)

## 2012-05-05 MED ORDER — ENSURE COMPLETE PO LIQD
237.0000 mL | Freq: Two times a day (BID) | ORAL | Status: DC
  Administered 2012-05-07 – 2012-05-12 (×5): 237 mL via ORAL

## 2012-05-05 NOTE — Progress Notes (Signed)
Physical Therapy Note  Patient Details  Name: RAMIZ TURPIN MRN: [id number] Date of Birth: Jan 01, 1961 Today's Date: 05/05/2012  1300-1330 (30 minutes) individual ([place] with OT - total time 60 minutes) Pain- pt reports pain LT LE with functional mobility/premedicated Focus of treatment- co- treat with OT- Supine to sit with assist LT LE and min assist to attain edge of bed; transfer bed to [facility] with sliding board min/mod assist with second person for safety/hold wc/manage LT LE; toilet hygiene - pt attempts to clean with side leans but requires assist to complete task    Waneta Fitting,JIM 05/05/2012, 7:59 AM

## 2012-05-05 NOTE — Progress Notes (Signed)
Speech Language Pathology Daily Session Note  Patient Details  Name: Miguel Brady MRN: [id number] Date of Birth: 1960/09/25  Today's Date: 05/05/2012 Time: 1300-1330 Time Calculation (min): 30 min  Short Term Goals: Week 2: SLP Short Term Goal 1 (Week 2): Pt will demonstrate functional problem solving for basic and familiar tasks with Mod A verbal and visual cues.  SLP Short Term Goal 2 (Week 2): Pt will utilize slow rate and over articulation to increase speech intelligibility at the sentence level with supervision verbal cues.  SLP Short Term Goal 3 (Week 2): Pt will demonstrate topic maintenance for ~ 4 turns with Mod A verbal cues for redirection  SLP Short Term Goal 4 (Week 2): Pt will demonstrate sustained attention to task for ~15 minutes with Min A verbal cues.   Skilled Therapeutic Interventions:  Co-tx with OT with focus on emergent awareness, selective attention and functional problem solving. Pt required Min verbal cues to decrease perseveration on pain and redirection to dressing tasks. Pt also required Mod A verbal and question cues for functional problem solving and emergent awareness of safety awareness and how to move his body during sliding board transfers and positioning in wheelchair.    FIM:  Comprehension Comprehension Mode: Auditory Comprehension: 5-Understands basic 90% of the time/requires cueing < 10% of the time Expression Expression Mode: Verbal Expression: 4-Expresses basic 75 - 89% of the time/requires cueing 10 - 24% of the time. Needs helper to occlude trach/needs to repeat words. Social Interaction Social Interaction: 4-Interacts appropriately 75 - 89% of the time - Needs redirection for appropriate language or to initiate interaction. Problem Solving Problem Solving: 4-Solves basic 75 - 89% of the time/requires cueing 10 - 24% of the time Memory Memory: 3-Recognizes or recalls 50 - 74% of the time/requires cueing 25 - 49% of the time FIM -  Eating Eating Activity: 5: Set-up assist for open containers  Pain Intermittent pain throughout session, pt pre-medicated   Therapy/Group: Co-treatment   Sem Mccaughey 05/05/2012, 2:42 PM

## 2012-05-05 NOTE — Progress Notes (Signed)
Occupational Therapy Session Note  Patient Details  Name: Miguel Brady MRN: [id number] Date of Birth: 05-06-1961  Today's Date: 05/05/2012 Time: 1330-1400 Time Calculation (min): 30 min  Short Term Goals: Week 2:  OT Short Term Goal 1 (Week 2): Pt will be introduced to HEP program for bilateral UE for strengthening  OT Short Term Goal 2 (Week 2): Pt will perform slide board transfer mod A +2 (for safety) bed<>w/c OT Short Term Goal 3 (Week 2): Pt will don shirt EOB with supervision OT Short Term Goal 4 (Week 2): Pt will self feed with left hand all meals OT Short Term Goal 5 (Week 2): Pt will demonstrate selective attention for 15 min with min A  Skilled Therapeutic Interventions/Progress Updates:    1:1 co treatment with physical therapy: focus on bed mobility with HOB elevated and bed rail. Pt only needed Assistance to manage his left LE to come to EOB, transfer bed<drop arm commode via slide board with mod A with 2nd person holding commode in place, lateral leans on to bed and chair on other side for clothing management - pt still requires assistance for up and down, attempted hygiene with a lateral lean but unable to reach entirely, slide board transfer with min A with 2nd person holding chair in place to get into w/c, self propelled to elevated to go downstairs to get some fresh air (a goal to work towards in the session) and for tolerance to sitting up. Pt expressed some repeated "scary dreams" but he is afraid to share too much information for fear of getting into trouble. Will let SW know.   Therapy Documentation Precautions:  Precautions Precautions: Fall Precaution Comments: WBAT on RLE for transfers only, no active ext on LLE Required Braces or Orthoses: Other Brace/Splint Other Brace/Splint: knee brace L locked in extension, no active knee extension but full ROM at hip Restrictions Weight Bearing Restrictions: Yes RLE Weight Bearing: Weight bearing as tolerated LLE  Weight Bearing: Non weight bearing Pain:  ongoing complaints of pain in pelvis and left LE  See FIM for current functional status  Therapy/Group: Individual Therapy and Co-Treatment  Roney Mans [facility] 05/05/2012, 2:03 PM

## 2012-05-05 NOTE — Progress Notes (Addendum)
Nutrition Follow-up  Intervention:   1. Discontinue Resource Breeze PO TID 2. Add Ensure Complete PO BID 3. Offer small frequent snacks from unit between meals to encourage intake 4. RD to continue to follow nutrition care plan  Assessment:   Continues with Dysphagia 3 diet and thin liquids. Pt stated that he is tired of [employer]. Will discontinue. Stated that he is eating better at lunch and dinner. Not a big breakfast eater so not eating much at those times. Will take meds in yogurt.  Pt states that he hasn't had a BM in 5 days. RN confirms this. Pt said that he doesn't feel like eating 2/2 constipation. RN said pt was agreeable to taking Miralax today but has refused laxatives recently 2/2 fear of having diarrhea again.  Diet Order:  Dysphagia 3 with thin liquids Supplements: Resource Breeze PO TID   Meds: Scheduled Meds:    . antiseptic oral rinse  15 mL Mouth Rinse QID  . chlorhexidine  15 mL Mouth Rinse BID  . cholestyramine  4 g Oral Q2000  . clonazePAM  1 mg Per Tube QHS  . dextromethorphan-guaiFENesin  1 tablet Oral BID  . enoxaparin (LOVENOX) injection  40 mg Subcutaneous Q24H  . feeding supplement  1 Container Oral TID BM  . fentaNYL  25 mcg Transdermal Q72H  . gabapentin  200 mg Oral TID  . insulin aspart  0-5 Units Subcutaneous QHS  . insulin aspart  0-9 Units Subcutaneous TID WC  . lisinopril  5 mg Oral BID  . metFORMIN  500 mg Oral BID WC  . metoprolol tartrate  50 mg Oral BID  . pantoprazole  40 mg Oral Q1200  . QUEtiapine  25 mg Oral BID  . saccharomyces boulardii  500 mg Oral BID   Continuous Infusions:    . Gerhardt's butt cream     PRN Meds:.acetaminophen, albuterol, alum & mag hydroxide-simeth, bisacodyl, cloNIDine, diphenhydrAMINE, Gerhardt's butt cream, guaiFENesin-dextromethorphan, methocarbamol, naloxone (NARCAN) injection, ondansetron (ZOFRAN) IV, ondansetron, oxyCODONE, polyethylene glycol, prochlorperazine, prochlorperazine,  prochlorperazine, sodium chloride, traMADol, traZODone  Labs:  CMP     Component Value Date/Time   NA 137 04/27/2012 1755   K 3.8 04/27/2012 1755   CL 103 04/27/2012 1755   CO2 24 04/27/2012 1755   GLUCOSE 137* 04/27/2012 1755   BUN 21 04/27/2012 1755   CREATININE 0.60 04/29/2012 0500   CALCIUM 8.3* 04/27/2012 1755   PROT 6.9 04/23/2012 0605   ALBUMIN 2.5* 04/23/2012 0605   AST 17 04/23/2012 0605   ALT 21 04/23/2012 0605   ALKPHOS 332* 04/23/2012 0605   BILITOT 1.7* 04/23/2012 0605   GFRNONAA >90 04/29/2012 0500   GFRAA >90 04/29/2012 0500   CBG (last 3)   Basename 05/05/12 0718 05/04/12 2104 05/04/12 1617  GLUCAP 118* 117* 103*      Intake/Output Summary (Last 24 hours) at 05/05/12 1140 Last data filed at 05/05/12 0900  Gross per 24 hour  Intake    240 ml  Output    245 ml  Net     -5 ml  BM 8/15  Weight Status:  126.1 kg - wt continues to trend down  Estimated needs:  2400 - 2600 kcal, 135 - 150 grams protein  Nutrition Dx:  Increased nutrient needs r/t trauma, healing and recovery AEB estimated needs. Ongoing.  Goal: Pt to meet >/= 90% of their estimated nutrition needs; not met  Monitor:  Weights, labs, PO intake, I/O's  Jarold Motto MS, RD, LDN Pager: [phone]  After-hours pager: [phone]

## 2012-05-05 NOTE — Progress Notes (Signed)
Physical Therapy Note  Patient Details  Name: Miguel Brady MRN: [id number] Date of Birth: Dec 23, 1960 Today's Date: 05/05/2012  Time: 1000-1055 55 minutes  Pt c/o pelvic pain throughout session, RN aware, repositioned for comfort.  Treatment focused on improving sliding board transfers and wt shifts for scooting anterior/posterior on mat and in w/c.  Sliding board transfers with +2 assist for L LE management and safety.  Pt able to perform 65%.  Pt requires cues for forward lean and wt shifting but improving independence with overall mobility.  Scooting edge of mat with min A for R hip, mod-max A L hip due to NWB status and increased pain.  Pt with improved motor control and planning of mobility sequence and tasks.  Pt limited this session by perseveration on pain, attempted UE therex for general activity tolerance, pt refused due to increased pain.  W/c mobility for UE strength and endurance on carpet and tile surfaces with supervision, frequent rests due to c/o pain.  Pt repositioned in bed at end of session, improving awareness of situation and deficits noted.   Individual therapy  Anasia Agro 05/05/2012, 10:51 AM

## 2012-05-05 NOTE — Progress Notes (Signed)
Occupational Therapy Session Note  Patient Details  Name: Miguel Brady MRN: [id number] Date of Birth: [birth year]  Today's Date: 05/05/2012 Time: 1400-1455 Time Calculation (min): 55 min  Short Term Goals: Week 2:  OT Short Term Goal 1 (Week 2): Pt will be introduced to HEP program for bilateral UE for strengthening  OT Short Term Goal 2 (Week 2): Pt will perform slide board transfer mod A +2 (for safety) bed<>w/c OT Short Term Goal 3 (Week 2): Pt will don shirt EOB with supervision OT Short Term Goal 4 (Week 2): Pt will self feed with left hand all meals OT Short Term Goal 5 (Week 2): Pt will demonstrate selective attention for 15 min with min A  Skilled Therapeutic Interventions:  Patient in w/c at beginning of session.  In relatively quiet environment (could occasionally hear staff talking around the corner) patient performed cognitive task of learning a new simple card game which required attention, memory, and problem solving.  Initially, patient required max encouragement to participate, mod assist with cognition and stated, "I can't count" (among other things) in an effort to be excused from the activity.  When patient realized we would end session by transferring to bed, patient was amazing as he was suddenly able to complete this task quickly, independently, and without cues for attention, memory or problem solving! Transferred w/c to bed traveling to patient's right via slide board with max assist and an additional person to stabilize w/c for safety.  After patient was made comfortable in bed, he stated that he feels he might transfer better if her travels to his left because he is left handed.  Patient agreed to discuss this with his primary therapy team tomorrow.  RN arrived with medication and patient left with call bell phone and bedside table within reach.  Therapy Documentation Precautions:  Precautions Precautions: Fall Precaution Comments: WBAT on RLE for transfers only,  no active ext on LLE Required Braces or Orthoses: Other Brace/Splint Other Brace/Splint: knee brace L locked in extension, no active knee extension but full ROM at hip Restrictions Weight Bearing Restrictions: Yes RLE Weight Bearing: Weight bearing as tolerated LLE Weight Bearing: Non weight bearing  Pain: 10/10 LLE, RN aware, meds provided, repositioned, and transferred back to bed  See FIM for current functional status  Therapy/Group: Individual Therapy  [name] 05/05/2012, 4:49 PM

## 2012-05-05 NOTE — Progress Notes (Signed)
Subjective/Complaints: Slept well no pain issues A 12 point review of systems has been performed and if not noted above is otherwise negative.   Objective: Vital Signs: Blood pressure 126/79, pulse 91, temperature 98.9 F (37.2 C), temperature source Oral, resp. rate 18, weight 126.1 kg (278 lb), SpO2 95.00%. No results found. No results found for this basename: WBC:2,HGB:2,HCT:2,PLT:2 in the last 72 hours No results found for this basename: NA:2,K:2,CL:2,CO2:2,GLUCOSE:2,BUN:2,CREATININE:2,CALCIUM:2 in the last 72 hours CBG (last 3)   Basename 05/05/12 0718 05/04/12 2104 05/04/12 1617  GLUCAP 118* 117* 103*    Wt Readings from Last 3 Encounters:  05/05/12 126.1 kg (278 lb)  04/22/12 134.5 kg (296 lb 8.3 oz)  04/22/12 134.5 kg (296 lb 8.3 oz)    Physical Exam:   General: Alert and oriented x 3, No apparent distress.obese HEENT: Head is normocephalic, atraumatic, PERRLA, EOMI, sclera anicteric, oral mucosa pink and moist, dentition intact, ext ear canals clear,  Neck: Supple without JVD or lymphadenopathy Heart: Reg rate and rhythm. No murmurs rubs or gallops Chest: CTA bilaterally without wheezes, rales, or rhonchi; no distress Abdomen: Soft, non-tender, non-distended, bowel sounds positive. Extremities: No clubbing, cyanosis, or edema. Pulses are 2+.  Knee incision CDI sutures out Skin: wounds generally clean and intact.sutures at L knee and leg wound CDINeuro: alert, poor insight and awareness. . Able to follow simple one step commands. Cranial nerves 2-12 are intact. Sensory exam is normal except for left leg. Reflexes are 1 to 2+ in all 4's. Fine motor coordination is intact. No tremors. Motor function is grossly 4 in the UE. RLE is 2 prox to 4 distally. LLE not tested proximally, he has trace movement in the foot Musculoskeletal: left leg in KI. Wrapped. Psych: . Pt is cooperative     Assessment/Plan: 1. 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 Physiatrist is providing close team supervision and 24 hour management of active medical problems listed below. Physiatrist and rehab team continue to assess barriers to discharge/monitor patient progress toward functional and medical goals. FIM: FIM - Bathing Bathing Steps Patient Completed: Chest;Right Arm;Left Arm;Abdomen;Front perineal area Bathing: 3: Mod-Patient completes 5-7 16f 10 parts or 50-74%  FIM - Upper Body Dressing/Undressing Upper body dressing/undressing steps patient completed: Thread/unthread right sleeve of pullover shirt/dresss;Put head through opening of pull over shirt/dress;Pull shirt over trunk;Thread/unthread left sleeve of pullover shirt/dress Upper body dressing/undressing: 5: Set-up assist to: Obtain clothing/put away FIM - Lower Body Dressing/Undressing Lower body dressing/undressing: 1: Total-Patient completed less than 25% of tasks  FIM - Toileting Toileting: 0: Activity did not occur  FIM - Diplomatic Services operational officer Devices: Psychiatrist Transfers: 0-Activity did not occur  FIM - Architectural technologist Transfer: 1: Two helpers;1: Mechanical lift  FIM - Locomotion: Wheelchair Locomotion: Wheelchair: 0: Activity did not occur FIM - Locomotion: Ambulation Ambulation/Gait Assistance: Not tested (comment) Locomotion: Ambulation: 0: Activity did not occur  Comprehension Comprehension Mode: Auditory Comprehension: 4-Understands basic 75 - 89% of the time/requires cueing 10 - 24% of the time  Expression Expression Mode: Verbal Expression: 4-Expresses basic 75 - 89% of the time/requires cueing 10 - 24% of the time. Needs helper to occlude trach/needs to repeat words.  Social Interaction Social Interaction: 3-Interacts appropriately 50 - 74% of the time - May be physically  or verbally inappropriate.  Problem Solving Problem Solving Mode: Not assessed  Problem Solving: 3-Solves basic 50 - 74% of the time/requires cueing 25 - 49% of the time  Memory Memory: 3-Recognizes or recalls 50 - 74% of the time/requires cueing 25 - 49% of the time Medical Problem List and Plan:  1. DVT Prophylaxis/Anticoagulation: Pharmaceutical: Lovenox  2. Pain Management: tylenol and oxycodone. Monitor for pain control with increased activity  -neurontin for neuro pain  -fentanyl also added 3. Mood: seroquel at decresed dose and klonopin for mood stabilization and To assist with sleep. . Trazodone prn as well .  Will need adjustment couseling 4. Neuropsych: This patient is not capable of making decisions on his/her own behalf.  5. Serratia bronchitis: resolved 6. Leucocytosis: see above. Follow clinically, --no  Active signs of infection. 7. DM type 2: glucophage and sliding scale insulin with reasonable control  8. ABLA: follow serial cbc's. Multivitamin with iron.  9. 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 Is NWB LLE and WBAT RLE for transfer only 10.  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  HTN will increase metoprolol LOS (Days) 13 A FACE TO FACE EVALUATION WAS PERFORMED  Dejanique Ruehl T 05/05/2012, 8:26 AM

## 2012-05-05 NOTE — Progress Notes (Signed)
Occupational Therapy Session Note  Patient Details  Name: Miguel Brady MRN: [id number] Date of Birth: November 03, 1960  Today's Date: 05/05/2012 Time: 0900-0930 Time Calculation (min): 30 min  Short Term Goals: Week 3:     Skilled Therapeutic Interventions/Progress Updates:    Co-tx with SLP with focus on bed mobility, transfers, UB dressing sitting EOB, and appropriately directing care giver for appropriate level of assistance.  Pt performs bed>w/c sliding board transfer with min/mod A (tot A + 2 for safety with stabilizing w/c during transfer).  Pt performs rolling in bed using bed rails and min A from therapist.   Therapy Documentation Precautions:  Precautions Precautions: Fall Precaution Comments: WBAT on RLE for transfers only, no active ext on LLE Required Braces or Orthoses: Other Brace/Splint Other Brace/Splint: knee brace L locked in extension, no active knee extension but full ROM at hip Restrictions Weight Bearing Restrictions: Yes RLE Weight Bearing: Weight bearing as tolerated LLE Weight Bearing: Non weight bearing General:   Vital Signs:   Pain: Pain Assessment Pain Assessment: No/denies pain    See FIM for current functional status  Therapy/Group: Co-Treatment  Rich Brave 05/05/2012, 10:13 AM

## 2012-05-06 ENCOUNTER — Inpatient Hospital Stay (HOSPITAL_COMMUNITY): Payer: Medicaid Other | Admitting: *Deleted

## 2012-05-06 ENCOUNTER — Inpatient Hospital Stay (HOSPITAL_COMMUNITY): Payer: Medicaid Other | Admitting: Occupational Therapy

## 2012-05-06 ENCOUNTER — Inpatient Hospital Stay (HOSPITAL_COMMUNITY): Payer: Medicaid Other | Admitting: Speech Pathology

## 2012-05-06 ENCOUNTER — Inpatient Hospital Stay (HOSPITAL_COMMUNITY): Payer: Medicaid Other | Admitting: Physical Therapy

## 2012-05-06 LAB — MRSA PCR SCREENING: MRSA by PCR: NEGATIVE

## 2012-05-06 LAB — GLUCOSE, CAPILLARY: Glucose-Capillary: 115 mg/dL — ABNORMAL HIGH (ref 70–99)

## 2012-05-06 MED ORDER — GABAPENTIN 300 MG PO CAPS
300.0000 mg | ORAL_CAPSULE | Freq: Three times a day (TID) | ORAL | Status: DC
  Administered 2012-05-06 – 2012-05-08 (×8): 300 mg via ORAL
  Filled 2012-05-06 (×12): qty 1

## 2012-05-06 NOTE — Patient Care Conference (Signed)
Inpatient RehabilitationTeam Conference Note Date: 05/05/2012   Time: 3:00 PM    Patient Name: Miguel Brady      Medical Record Number: [id number]  Date of Birth: 01/27/1961 Sex: Male         Room/Bed: 4002/4002-01 Payor Info: Payor: MEDICAID PENDING  Plan: MEDICAID PENDING  Product Type: *No Product type*     Admitting Diagnosis: TBI/POLYTRAUMA   Admit Date/Time:  04/22/2012  4:33 PM Admission Comments: No comment available   Primary Diagnosis:  TBI (traumatic brain injury) Principal Problem: TBI (traumatic brain injury)  Patient Active Problem List   Diagnosis Date Noted  . TBI (traumatic brain injury) 04/22/2012  . Traumatic closed fx of eight or more ribs with minimal displacement 04/03/2012  . Pelvic fracture 04/03/2012  . Femur open fracture, left 04/03/2012  . Open left tibial fracture 04/03/2012  . Hemothorax, left 04/03/2012  . Lumbar transverse process fracture 04/03/2012  . Acute blood loss anemia 04/03/2012  . Frontal skull fracture 04/03/2012  . Patella fracture, left 04/03/2012  . SKIN LESION 03/02/2010  . DENTAL CARIES 02/07/2010  . HEADACHE 02/07/2010  . HYPERLIPIDEMIA 01/30/2009  . OBESITY 12/23/2008  . TOBACCO ABUSE 12/23/2008  . 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 DIABETES MELLITUS 09/15/2008  . GERD 05/05/2007  . TRIGGER FINGER 05/05/2007  . INJURY NOS, FINGER 05/05/2007  . ERECTILE DYSFUNCTION, ORGANIC, HX OF 05/05/2007    Expected Discharge Date: Expected Discharge Date:  (plan is SNF)  Team Members Present: Physician: Dr. Faith Rogue Social Worker Present: Amada Jupiter, LCSW Nurse Present: Carlean Purl, RN PT Present: Reggy Eye, PT OT Present: Roney Mans, OT;Ardis Rowan, Sherryl Manges, OT SLP Present: Feliberto Gottron, SLP     Current Status/Progress Goal Weekly Team Focus  Medical   pain an issue. improving cognitively, wound care issue  see above and prior  --   Bowel/Bladder   continent  bowel and bladder uses urinal and staff empties occasional incontinence  lbm 8-15 remains on quetran   continent bowel and bladder   up to toilet and or bedside  for toileting    Swallow/Nutrition/ Hydration   Dys. 3 texturs, thin liquids with intermittent supervision for encouragement of PO intake  Min A  Trials of regular textures    ADL's   tot A LB bathing/dressing; tot A + 2 sliding board treansfers  min to mod A overall   attention, participation, transfers, awareness, sitting tolerance    Mobility   +2 assist, pt 50%  +1 assist  transfers, activity tolerance   Communication   Min-Mod A  Min A  increased utilization of call bell for expression of wants/needs    Safety/Cognition/ Behavioral Observations  Min-Mod A  Min A  selective attention, emergent awareness, functional porblem solving    Pain   left leg pain oxycodone 15-20mg  po every 4 hours   less than 3  monitor effectiveness of pain   Skin   incision to left leg healing sutures out sutures remain in abdomen and left             *See Interdisciplinary Assessment and Plan and progress notes for long and short-term goals  Barriers to Discharge: neuro and ortho deficits    Possible Resolutions to Barriers:  supervision and assist at discharge    Discharge Planning/Teaching Needs:  SNF placement      Team Discussion:  Pt making gains and is very good at directing his own care which will be  helpful at SNF.  Team feels pt is at level that we can begin process of SNF bed search  Revisions to Treatment Plan:  None   Continued Need for Acute Rehabilitation Level of Care: The patient requires daily medical management by a physician with specialized training in physical medicine and rehabilitation for the following conditions: Daily direction of a multidisciplinary physical rehabilitation program to ensure safe treatment while eliciting the highest outcome that is of practical value to the patient.: Yes Daily medical  management of patient stability for increased activity during participation in an intensive rehabilitation regime.: Yes Daily analysis of laboratory values and/or radiology reports with any subsequent need for medication adjustment of medical intervention for : Neurological problems;Other;Post surgical problems  Sharne Linders 05/06/2012, 5:52 PM

## 2012-05-06 NOTE — Progress Notes (Signed)
Subjective/Complaints: Slept well. Wants to know when he'll walk again. Left foot still tender. A 12 point review of systems has been performed and if not noted above is otherwise negative.   Objective: Vital Signs: Blood pressure 106/72, pulse 103, temperature 98.7 F (37.1 C), temperature source Oral, resp. rate 20, weight 126.1 kg (278 lb), SpO2 94.00%. No results found. No results found for this basename: WBC:2,HGB:2,HCT:2,PLT:2 in the last 72 hours No results found for this basename: NA:2,K:2,CL:2,CO2:2,GLUCOSE:2,BUN:2,CREATININE:2,CALCIUM:2 in the last 72 hours CBG (last 3)   Basename 05/06/12 0713 05/05/12 2145 05/05/12 1635  GLUCAP 99 109* 111*    Wt Readings from Last 3 Encounters:  05/05/12 126.1 kg (278 lb)  04/22/12 134.5 kg (296 lb 8.3 oz)  04/22/12 134.5 kg (296 lb 8.3 oz)    Physical Exam:   General: Alert and oriented x 3, No apparent distress.obese HEENT: Head is normocephalic, atraumatic, PERRLA, EOMI, sclera anicteric, oral mucosa pink and moist, dentition intact, ext ear canals clear,  Neck: Supple without JVD or lymphadenopathy Heart: Reg rate and rhythm. No murmurs rubs or gallops Chest: CTA bilaterally without wheezes, rales, or rhonchi; no distress Abdomen: Soft, non-tender, non-distended, bowel sounds positive. Extremities: No clubbing, cyanosis, or edema. Pulses are 2+.  Knee incision CDI sutures out Skin:  A few reamaining sutures in the groin/inguinal areas Neuro: alert, poor insight and awareness. . Able to follow simple one step commands. Cranial nerves 2-12 are intact. Sensory exam is normal except for left leg. Reflexes are 1 to 2+ in all 4's. Fine motor coordination is intact. No tremors. Motor function is grossly 4 in the UE. RLE is 2 prox to 4 distally. LLE not tested proximally, he 1-2/5 strength in the foot Musculoskeletal: left leg in KI. Wrapped. Psych: . Pt is cooperative      Assessment/Plan: 1. Functional deficits secondary to TBI and  polytrauma as below which require 3+ hours per day of interdisciplinary therapy in a comprehensive inpatient rehab setting. Physiatrist is providing close team supervision and 24 hour management of active medical problems listed below. Physiatrist and rehab team continue to assess barriers to discharge/monitor patient progress toward functional and medical goals. FIM: FIM - Bathing Bathing Steps Patient Completed: Chest;Right Arm;Left Arm;Abdomen;Front perineal area Bathing: 3: Mod-Patient completes 5-7 85f 10 parts or 50-74%  FIM - Upper Body Dressing/Undressing Upper body dressing/undressing steps patient completed: Thread/unthread right sleeve of pullover shirt/dresss;Put head through opening of pull over shirt/dress;Pull shirt over trunk;Thread/unthread left sleeve of pullover shirt/dress Upper body dressing/undressing: 5: Set-up assist to: Obtain clothing/put away FIM - Lower Body Dressing/Undressing Lower body dressing/undressing: 1: Total-Patient completed less than 25% of tasks  FIM - Toileting Toileting: 0: Activity did not occur  FIM - Diplomatic Services operational officer Devices: Psychiatrist Transfers: 0-Activity did not occur  FIM - Banker Devices: Sliding board Bed/Chair Transfer: 1: Two helpers  FIM - Locomotion: Wheelchair Locomotion: Wheelchair: 2: Travels 50 - 149 ft with supervision, cueing or coaxing FIM - Locomotion: Ambulation Ambulation/Gait Assistance: Not tested (comment) Locomotion: Ambulation: 0: Activity did not occur  Comprehension Comprehension Mode: Auditory Comprehension: 5-Understands basic 90% of the time/requires cueing < 10% of the time  Expression Expression Mode: Verbal Expression: 4-Expresses basic 75 - 89% of the time/requires cueing 10 - 24% of the time. Needs helper to occlude trach/needs to repeat words.  Social Interaction Social Interaction: 4-Interacts appropriately 75 - 89%  of the time - Needs redirection for appropriate language or to  initiate interaction.  Problem Solving Problem Solving Mode: Not assessed Problem Solving: 4-Solves basic 75 - 89% of the time/requires cueing 10 - 24% of the time  Memory Memory: 3-Recognizes or recalls 50 - 74% of the time/requires cueing 25 - 49% of the time Medical Problem List and Plan:  1. DVT Prophylaxis/Anticoagulation: Pharmaceutical: Lovenox  2. Pain Management: tylenol and oxycodone. Monitor for pain control with increased activity  -neurontin for neuro pain- will further titrate to 300 tid  -fentanyl also added 3. Mood: seroquel at decresed dose and klonopin for mood stabilization and To assist with sleep. . Trazodone prn as well .  Will need adjustment couseling 4. Neuropsych: This patient is not capable of making decisions on his/her own behalf.  5. Serratia bronchitis: resolved 6. Leucocytosis: see above. Follow clinically, --no  Active signs of infection. 7. DM type 2: glucophage and sliding scale insulin with reasonable control  8. ABLA: follow serial cbc's. Multivitamin with iron.  9. 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 Is NWB LLE and WBAT RLE for transfer only 10.  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  HTN will increased metoprolol LOS (Days) 14 A FACE TO FACE EVALUATION WAS PERFORMED  Shelia Kingsberry T 05/06/2012, 8:16 AM

## 2012-05-06 NOTE — Progress Notes (Signed)
Speech Language Pathology Daily Session Note  Patient Details  Name: OVERTON BOGGUS MRN: [id number] Date of Birth: July 09, 1961  Today's Date: 05/06/2012 Time: 0900-0955 Time Calculation (min): 55 min  Short Term Goals: Week 2: SLP Short Term Goal 1 (Week 2): Pt will demonstrate functional problem solving for basic and familiar tasks with Mod A verbal and visual cues.  SLP Short Term Goal 2 (Week 2): Pt will utilize slow rate and over articulation to increase speech intelligibility at the sentence level with supervision verbal cues.  SLP Short Term Goal 3 (Week 2): Pt will demonstrate topic maintenance for ~ 4 turns with Mod A verbal cues for redirection  SLP Short Term Goal 4 (Week 2): Pt will demonstrate sustained attention to task for ~15 minutes with Min A verbal cues.   Skilled Therapeutic Interventions: Treatment focus on mental flexibility, emergent awareness and trails of regular textures. Pt required Mod A verbal and semantic cues for mental flexibly in regards to functional kitchen task and awareness of how kitchen tasks will need to be performed secondary to wheelchair, etc. Pt also verbalized emergent awareness of irritability and decreased frustration tolerance during tasks "he doesn't want to do" but verbalized understanding of need to perform specific tasks for clinician and need to self-monitor "attitude" throughout therapy sessions.  Pt consumed trials of regular textures with thin liquids. Pt demonstrated efficient mastication without overt s/s of aspiration. Recommend regular textures with thin liquids with intermittent supervision for meals .   FIM:  Comprehension Comprehension: 5-Understands basic 90% of the time/requires cueing < 10% of the time Expression Expression Mode: Verbal Expression: 5-Expresses basic 90% of the time/requires cueing < 10% of the time. Social Interaction Social Interaction: 5-Interacts appropriately 90% of the time - Needs monitoring or  encouragement for participation or interaction. Problem Solving Problem Solving: 5-Solves basic 90% of the time/requires cueing < 10% of the time Memory Memory: 4-Recognizes or recalls 75 - 89% of the time/requires cueing 10 - 24% of the time FIM - Eating Eating Activity: 6: Swallowing techniques: self-managed  Pain Pain Assessment Pain Assessment: No/denies pain  Therapy/Group: Individual Therapy  Jaelon Gatley 05/06/2012, 11:10 AM

## 2012-05-06 NOTE — Progress Notes (Signed)
Physical Therapy Note  Patient Details  Name: ANES RIGEL MRN: [id number] Date of Birth: [birth year] Today's Date: 05/06/2012  Time: 1000-1056 56 minutes  Pt c/o L LE pain with movement, eased with repositioning, rest.  Rolling and bed mobility with mod A for L LE management, pt able to direct care and manage all but L LE.  Pt assists with donning pants, able to don shirt with supervision.  Sliding board transfers with mod A.  Pt able to explain sliding board placement, needs assist to place, transfer assist only to hold L LE.  Bean bag toss game for attention, mental math, turn taking with supervision cuing.  Pt with improved selective and alternating attention noted, no perseveration on pain, improvement in staying on topic during session.  Overall improved performance with physical and cognitive tasks today.  Individual therapy   DONAWERTH,KAREN 05/06/2012, 10:57 AM

## 2012-05-06 NOTE — Progress Notes (Signed)
Occupational Therapy Session Note  Patient Details  Name: Miguel Brady MRN: [id number] Date of Birth: 03/03/1961  Today's Date: 05/06/2012 Time: 1300-1400 Time Calculation (min): 60 min  Short Term Goals: Week 2:  OT Short Term Goal 1 (Week 2): Pt will be introduced to HEP program for bilateral UE for strengthening  OT Short Term Goal 2 (Week 2): Pt will perform slide board transfer mod A +2 (for safety) bed<>w/c OT Short Term Goal 3 (Week 2): Pt will don shirt EOB with supervision OT Short Term Goal 4 (Week 2): Pt will self feed with left hand all meals OT Short Term Goal 5 (Week 2): Pt will demonstrate selective attention for 15 min with min A  Skilled Therapeutic Interventions/Progress Updates:    1:1 functional kitchen task- baking a cake with focus on alternating attention to tasks, problem solving, emergent awareness, ability to maneuver w/c around kitchen and small spaces - problem solving with elevating leg rest on (left), able to self propel from room down to kitchen and then back to room at end of session. Pt's friend present for session- helpful in keeping his spirits up and joking around.  Therapy Documentation Precautions:  Precautions Precautions: Fall Precaution Comments: WBAT on RLE for transfers only, no active ext on LLE Required Braces or Orthoses: Other Brace/Splint Other Brace/Splint: knee brace L locked in extension, no active knee extension but full ROM at hip Restrictions Weight Bearing Restrictions: Yes RLE Weight Bearing: Weight bearing as tolerated LLE Weight Bearing: Non weight bearing Pain: On going discomfort in left great toe- RN aware  See FIM for current functional status  Therapy/Group: Individual Therapy  Roney Mans [facility] 05/06/2012, 2:54 PM

## 2012-05-06 NOTE — Progress Notes (Signed)
Physical Therapy Note  Patient Details  Name: Miguel Brady MRN: [id number] Date of Birth: 1961/08/15 Today's Date: 05/06/2012  1440-1525 (45 minutes) individual Pain: pt reports unrated but decreasing pain LT LE during functional activities/premedicated Focus of treatment: transfer training/ toileting using BSC Treatment: Pt states he wants to use [facility] for bowel movement. Pt in wc.; transfers wc to [facility] using sliding board min assist for board placement and managing LT LE; pt able to lean from side to side and manage clothes with min assist; hygiene max assist; BSC to bed with sliding board placement + min assist to manage LT LE ; sit to supine min/mod assist. Pt requiring less assist with toileting and transfers.  Laurena Valko,JIM 05/06/2012, 7:30 AM

## 2012-05-07 ENCOUNTER — Inpatient Hospital Stay (HOSPITAL_COMMUNITY): Payer: Medicaid Other

## 2012-05-07 ENCOUNTER — Inpatient Hospital Stay (HOSPITAL_COMMUNITY): Payer: Medicaid Other | Admitting: Physical Therapy

## 2012-05-07 ENCOUNTER — Inpatient Hospital Stay (HOSPITAL_COMMUNITY): Payer: Medicaid Other | Admitting: Speech Pathology

## 2012-05-07 ENCOUNTER — Encounter (HOSPITAL_COMMUNITY): Payer: Self-pay

## 2012-05-07 LAB — GLUCOSE, CAPILLARY
Glucose-Capillary: 128 mg/dL — ABNORMAL HIGH (ref 70–99)
Glucose-Capillary: 103 mg/dL — ABNORMAL HIGH (ref 70–99)

## 2012-05-07 NOTE — Progress Notes (Signed)
Nutrition Follow-up  Intervention:   1. Encourage Ensure Complete PO BID 2. Offer small frequent snacks from unit between meals to encourage intake 3. RD to continue to follow nutrition care plan  Assessment:   Advanced to Regular diet on 8/21. Per SW note, pt now working towards SNF placement.  RN and patient both confirm that pt is now eating better. Pt stated that his bowels are now under control and suspect that this is helping with his intake. Says he hasn't tried Ensure but is willing to.  Diet Order:  Regular Supplements: Ensure Complete PO BID   Meds: Scheduled Meds:    . antiseptic oral rinse  15 mL Mouth Rinse QID  . chlorhexidine  15 mL Mouth Rinse BID  . cholestyramine  4 g Oral Q2000  . clonazePAM  1 mg Per Tube QHS  . dextromethorphan-guaiFENesin  1 tablet Oral BID  . enoxaparin (LOVENOX) injection  40 mg Subcutaneous Q24H  . feeding supplement  237 mL Oral BID BM  . fentaNYL  25 mcg Transdermal Q72H  . gabapentin  300 mg Oral TID  . insulin aspart  0-5 Units Subcutaneous QHS  . insulin aspart  0-9 Units Subcutaneous TID WC  . lisinopril  5 mg Oral BID  . metFORMIN  500 mg Oral BID WC  . metoprolol tartrate  50 mg Oral BID  . pantoprazole  40 mg Oral Q1200  . QUEtiapine  25 mg Oral BID  . saccharomyces boulardii  500 mg Oral BID   Continuous Infusions:    . Gerhardt's butt cream     PRN Meds:.acetaminophen, albuterol, alum & mag hydroxide-simeth, bisacodyl, cloNIDine, diphenhydrAMINE, Gerhardt's butt cream, guaiFENesin-dextromethorphan, methocarbamol, naloxone (NARCAN) injection, ondansetron (ZOFRAN) IV, ondansetron, oxyCODONE, polyethylene glycol, prochlorperazine, prochlorperazine, prochlorperazine, sodium chloride, traMADol, traZODone  Labs:  CMP     Component Value Date/Time   NA 137 04/27/2012 1755   K 3.8 04/27/2012 1755   CL 103 04/27/2012 1755   CO2 24 04/27/2012 1755   GLUCOSE 137* 04/27/2012 1755   BUN 21 04/27/2012 1755   CREATININE 0.76  05/06/2012 0733   CALCIUM 8.3* 04/27/2012 1755   PROT 6.9 04/23/2012 0605   ALBUMIN 2.5* 04/23/2012 0605   AST 17 04/23/2012 0605   ALT 21 04/23/2012 0605   ALKPHOS 332* 04/23/2012 0605   BILITOT 1.7* 04/23/2012 0605   GFRNONAA >90 05/06/2012 0733   GFRAA >90 05/06/2012 0733   CBG (last 3)   Basename 05/07/12 0722 05/06/12 2054 05/06/12 1654  GLUCAP 105* 92 115*      Intake/Output Summary (Last 24 hours) at 05/07/12 1045 Last data filed at 05/07/12 0900  Gross per 24 hour  Intake    600 ml  Output    600 ml  Net      0 ml  BM 8/21  Weight Status:  120.1 kg - wt continues to trend down  Estimated needs:  2400 - 2600 kcal, 135 - 150 grams protein  Nutrition Dx:  Increased nutrient needs r/t trauma, healing and recovery AEB estimated needs. Ongoing.  Goal: Pt to meet >/= 90% of their estimated nutrition needs; not met  Monitor:  Weights, labs, PO intake, I/O's  Jarold Motto MS, RD, LDN Pager: [phone] After-hours pager: [phone]

## 2012-05-07 NOTE — Progress Notes (Signed)
Physical Therapy Weekly Progress Note  Patient Details  Name: Miguel Brady MRN: [id number] Date of Birth: May 29, 1961  Today's Date: 05/07/2012  Patient has met 3 of 3 short term goals.  Pt has improved overall attention and awareness as well as physical mobility.  Pt is able to work through pain and is able to perform sliding board transfers bed <> chair with mod-max A, assist with L LE and bed mobility with assist only for L LE.  Pt continues to fatigue easily with w/c mobility due to decreased activity tolerance.  Pt is also improving ability to direct his own care.  Patient continues to demonstrate the following deficits: decreased anticipatory awareness, impaired divided attention, increased pain, decreased activity tolerance, decreased strength and therefore will continue to benefit from skilled PT intervention to enhance overall performance with activity tolerance, balance, postural control, ability to compensate for deficits, attention, awareness and knowledge of precautions.  Patient progressing toward long term goals..  Continue plan of care.  PT Short Term Goals Week 2:  PT Short Term Goal 1 (Week 2): Pt will attend to functional task in quiet environment x 2 minutes PT Short Term Goal 1 - Progress (Week 2): Met PT Short Term Goal 2 (Week 2): Pt will perform rolling in bed with max A PT Short Term Goal 2 - Progress (Week 2): Met PT Short Term Goal 3 (Week 2): Pt will assist 50% with transfers bed <> chair PT Short Term Goal 3 - Progress (Week 2): Met  Skilled Therapeutic Interventions/Progress Updates:  Balance/vestibular training;Cognitive remediation/compensation;Discharge planning;Community reintegration;DME/adaptive equipment instruction;Neuromuscular re-education;Pain management;Patient/family education;Functional mobility training;Therapeutic Activities;UE/LE Coordination activities;Wheelchair propulsion/positioning;UE/LE Strength taining/ROM;Splinting/orthotics;Therapeutic  Exercise   See FIM for current functional status   Hilmar Moldovan 05/07/2012, 7:54 AM

## 2012-05-07 NOTE — Progress Notes (Signed)
Speech Language Pathology Daily Session Note  Patient Details  Name: Miguel Brady MRN: [id number] Date of Birth: 1960-10-02  Today's Date: 05/07/2012 Time: 0865-7846 Time Calculation (min): 55 min  Short Term Goals: Week 2: SLP Short Term Goal 1 (Week 2): Pt will demonstrate functional problem solving for basic and familiar tasks with Mod A verbal and visual cues.  SLP Short Term Goal 2 (Week 2): Pt will utilize slow rate and over articulation to increase speech intelligibility at the sentence level with supervision verbal cues.  SLP Short Term Goal 3 (Week 2): Pt will demonstrate topic maintenance for ~ 4 turns with Mod A verbal cues for redirection  SLP Short Term Goal 4 (Week 2): Pt will demonstrate sustained attention to task for ~15 minutes with Min A verbal cues.   Skilled Therapeutic Interventions: Treatment focus on topic maintenance and appropriateness and functional problem solving.  Pt required Mod A verbal and question cues to maintain topic of functional conversation and demonstrate appropriate language.  Pt participated in complex organization and problem solving task with Min A verbal cues for functional problem solving with new learning task. Pt taken to kitchen to complete functional baking task with Mod I.    FIM:  Comprehension Comprehension Mode: Auditory Comprehension: 5-Understands basic 90% of the time/requires cueing < 10% of the time Expression Expression: 5-Expresses basic 90% of the time/requires cueing < 10% of the time. Social Interaction Social Interaction: 5-Interacts appropriately 90% of the time - Needs monitoring or encouragement for participation or interaction. Problem Solving Problem Solving: 5-Solves basic 90% of the time/requires cueing < 10% of the time Memory Memory: 4-Recognizes or recalls 75 - 89% of the time/requires cueing 10 - 24% of the time FIM - Eating Eating Activity: 6: Swallowing techniques: self-managed  Pain No/Denies  Pain  Therapy/Group: Individual Therapy  Deshawna Mcneece 05/07/2012, 2:41 PM

## 2012-05-07 NOTE — Progress Notes (Signed)
Occupational Therapy Weekly Progress Note  Patient Details  Name: Miguel Brady MRN: [id number] Date of Birth: 10-23-1960  Today's Date: 05/07/2012  Patient has met 5 of 5 short term goals.  Patient continues to make good progress in therapy towards LTG. Pt is able to bathe and dressing UB supervision, bathing and dressing LB with total A with lateral leans and rolling, bed mobility with min A to come to EOB with bed rail and HOB elevated, slide board transfer with min to mod A with a 2nd person holding chair in place, able tp self propel w/c in a functional setting with supervision to manage positioning of LE. Pt's behaviors are consistent with Rancho Level VI  Patient continues to demonstrate the following deficits: muscle weakness and acute pain in left Le, decreased visual acuity and visual attention, decreased attention, decreased awareness, decreased problem solving and decreased safety awareness, peripheral and difficulty maintaining precautions and unable to stand due to precautions and therefore will continue to benefit from skilled OT intervention to enhance overall performance with BADL and Reduce care partner burden.  Patient progressing toward long term goals..  Continue plan of care.  OT Short Term Goals Week 2:  OT Short Term Goal 1 (Week 2): Pt will be introduced to HEP program for bilateral UE for strengthening  OT Short Term Goal 1 - Progress (Week 2): Met OT Short Term Goal 2 (Week 2): Pt will perform slide board transfer mod A +2 (for safety) bed<>w/c OT Short Term Goal 2 - Progress (Week 2): Met OT Short Term Goal 3 (Week 2): Pt will don shirt EOB with supervision OT Short Term Goal 3 - Progress (Week 2): Met OT Short Term Goal 4 (Week 2): Pt will self feed with left hand all meals OT Short Term Goal 4 - Progress (Week 2): Met OT Short Term Goal 5 (Week 2): Pt will demonstrate selective attention for 15 min with min A OT Short Term Goal 5 - Progress (Week 2): Met Week  3:  OT Short Term Goal 1 (Week 3): Pt will incorporate reacher to assist with threading pants over legs. OT Short Term Goal 2 (Week 3): Pt will perform slide board transfer to/from uneven surface with min A and +2 to hold s/c OT Short Term Goal 3 (Week 3): Pt will pull pants up seated EOB with mod A incorporating lateral leans OT Short Term Goal 4 (Week 3): Pt will direct doffing/donning of LLE brace with supervision  Skilled Therapeutic Interventions/Progress Updates: keep POC     Therapy Documentation Precautions:  Precautions Precautions: Fall Precaution Comments: WBAT on RLE for transfers only, no active ext on LLE Required Braces or Orthoses: Other Brace/Splint Other Brace/Splint: knee brace L locked in extension, no active knee extension but full ROM at hip Restrictions Weight Bearing Restrictions: Yes RLE Weight Bearing: Weight bearing as tolerated LLE Weight Bearing: Non weight bearing Pain:  ongoing pain in Left Le  See FIM for current functional status  Therapy/Group: Individual Therapy and Co-Treatment  Roney Mans [facility] 05/07/2012, 3:58 PM

## 2012-05-07 NOTE — Progress Notes (Signed)
Subjective/Complaints: No new issues reported. Slept well. A 12 point review of systems has been performed and if not noted above is otherwise negative.   Objective: Vital Signs: Blood pressure 141/93, pulse 87, temperature 98.8 F (37.1 C), temperature source Oral, resp. rate 18, weight 120.1 kg (264 lb 12.4 oz), SpO2 90.00%. No results found. No results found for this basename: WBC:2,HGB:2,HCT:2,PLT:2 in the last 72 hours  Basename 05/06/12 0733  NA --  K --  CL --  CO2 --  GLUCOSE --  BUN --  CREATININE 0.76  CALCIUM --   CBG (last 3)   Basename 05/06/12 2054 05/06/12 1654 05/06/12 1158  GLUCAP 92 115* 144*    Wt Readings from Last 3 Encounters:  05/07/12 120.1 kg (264 lb 12.4 oz)  04/22/12 134.5 kg (296 lb 8.3 oz)  04/22/12 134.5 kg (296 lb 8.3 oz)    Physical Exam:   General: Alert and oriented x 3, No apparent distress.obese HEENT: Head is normocephalic, atraumatic, PERRLA, EOMI, sclera anicteric, oral mucosa pink and moist, dentition intact, ext ear canals clear,  Neck: Supple without JVD or lymphadenopathy Heart: Reg rate and rhythm. No murmurs rubs or gallops Chest: CTA bilaterally without wheezes, rales, or rhonchi; no distress Abdomen: Soft, non-tender, non-distended, bowel sounds positive. Extremities: No clubbing, cyanosis, or edema. Pulses are 2+.  Knee incision CDI sutures out Skin:  A few reamaining sutures in the groin/inguinal areas Neuro: alert, poor insight and awareness. . Able to follow simple one step commands. Cranial nerves 2-12 are intact. Sensory exam is normal except for left leg. Reflexes are 1 to 2+ in all 4's. Fine motor coordination is intact. No tremors. Motor function is grossly 4 in the UE. RLE is 2 prox to 4 distally. LLE not tested proximally, he 1-2/5 strength in the foot Musculoskeletal: left leg in KI. Wrapped. Psych: . Pt is cooperative      Assessment/Plan: 1. 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 Physiatrist is providing close team supervision and 24 hour management of active medical problems listed below. Physiatrist and rehab team continue to assess barriers to discharge/monitor patient progress toward functional and medical goals. FIM: FIM - Bathing Bathing Steps Patient Completed: Chest;Right Arm;Left Arm;Abdomen;Front perineal area Bathing: 3: Mod-Patient completes 5-7 40f 10 parts or 50-74%  FIM - Upper Body Dressing/Undressing Upper body dressing/undressing steps patient completed: Thread/unthread right sleeve of pullover shirt/dresss;Put head through opening of pull over shirt/dress;Pull shirt over trunk;Thread/unthread left sleeve of pullover shirt/dress Upper body dressing/undressing: 5: Set-up assist to: Obtain clothing/put away FIM - Lower Body Dressing/Undressing Lower body dressing/undressing: 1: Total-Patient completed less than 25% of tasks  FIM - Toileting Toileting: 0: Activity did not occur  FIM - Diplomatic Services operational officer Devices: Psychiatrist Transfers: 0-Activity did not occur  FIM - Architectural technologist Transfer: 3: Bed > Chair or W/C: Mod A (lift or lower assist)  FIM - Locomotion: Wheelchair Locomotion: Wheelchair: 2: Travels 50 - 149 ft with supervision, cueing or coaxing FIM - Locomotion: Ambulation Ambulation/Gait Assistance: Not tested (comment) Locomotion: Ambulation: 0: Activity did not occur  Comprehension Comprehension Mode: Auditory Comprehension: 5-Understands basic 90% of the time/requires cueing < 10% of the time  Expression Expression Mode: Verbal Expression: 5-Expresses basic 90% of the time/requires cueing < 10% of the time.  Social Interaction Social Interaction: 5-Interacts appropriately 90% of  the time - Needs monitoring or encouragement for  participation or interaction.  Problem Solving Problem Solving Mode: Not assessed Problem Solving: 5-Solves basic 90% of the time/requires cueing < 10% of the time  Memory Memory: 4-Recognizes or recalls 75 - 89% of the time/requires cueing 10 - 24% of the time Medical Problem List and Plan:  1. DVT Prophylaxis/Anticoagulation: Pharmaceutical: Lovenox  2. Pain Management: tylenol and oxycodone. Monitor for pain control with increased activity  -neurontin for neuro pain- now at 300 tid  -fentanyl also added 3. Mood: seroquel at decresed dose and klonopin for mood stabilization and To assist with sleep. . Trazodone prn as well .  Will need adjustment couseling 4. Neuropsych: This patient is not capable of making decisions on his/her own behalf.  5. Serratia bronchitis: resolved 6. Leucocytosis: see above. Follow clinically, --no  Active signs of infection. 7. DM type 2: glucophage and sliding scale insulin with reasonable control  8. ABLA: follow serial cbc's. Multivitamin with iron.  9. 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 Is NWB LLE and WBAT RLE for transfer only 10.  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  HTN will increased metoprolol LOS (Days) 15 A FACE TO FACE EVALUATION WAS PERFORMED  Escarlet Saathoff T 05/07/2012, 6:56 AM

## 2012-05-07 NOTE — Progress Notes (Signed)
Physical Therapy Note  Patient Details  Name: Miguel Brady MRN: [id number] Date of Birth: Aug 03, 1961 Today's Date: 05/07/2012  Time: 900-930 30 minutes  Pt c/o L LE pain with movement, RN aware, medicated prior to session.  Bed mobility with min A for LLE management only.  Sliding board transfer to recliner with min A for L LE management only.  Activity tolerance/divided attention task with ball toss.  Pt requires mod cuing for divided attention during physical task.  Improved behavior and attention noted.  Individual therapy   Camar Guyton 05/07/2012, 9:56 AM

## 2012-05-07 NOTE — Progress Notes (Signed)
Occupational Therapy Session Note  Patient Details  Name: Miguel Brady MRN: [id number] Date of Birth: [birth year]  Today's Date: 05/07/2012  Session 1 Time: 0800-0900 Time Calculation (min): 60 min  Short Term Goals: Week 3:  OT Short Term Goal 1 (Week 3): Pt will incorporate reacher to assist with threading pants over legs. OT Short Term Goal 2 (Week 3): Pt will perform slide board transfer to/from uneven surface with min A and +2 to hold s/c OT Short Term Goal 3 (Week 3): Pt will pull pants up seated EOB with mod A incorporating lateral leans OT Short Term Goal 4 (Week 3): Pt will direct doffing/donning of LLE brace with supervision.  Skilled Therapeutic Interventions/Progress Updates:    Pt in bed resting but stated that he needed to get up and wash off before getting dressed.  Pt performed supine>sit with min A to manage LLE to edge of bed.  Pt completed UB bathing and dressing seated EOB with supervision.  Pt completed bathing front perineal area by leaning back onto bed to access groin area.  Pt required assistance threading pants over LLE but was able to thread pants over RLE before laying in bed to roll side<>side for assistance with pulling up pants.  Pt c/o dizziness when rolling in bed, L>R.  Pt demonstrated selective attention during session with min verbal cues to redirect.  Focus on bed mobility, activity tolerance, and attention.  Therapy Documentation Precautions:  Precautions Precautions: Fall Precaution Comments: WBAT on RLE for transfers only, no active ext on LLE Required Braces or Orthoses: Other Brace/Splint Other Brace/Splint: knee brace L locked in extension, no active knee extension but full ROM at hip Restrictions Weight Bearing Restrictions: Yes RLE Weight Bearing: Weight bearing as tolerated LLE Weight Bearing: Non weight bearing  See FIM for current functional status  Therapy/Group: Individual Therapy  Session 2: Time: 1100-1130 Pt denies  pain Individual therapy Pt engaged in computer activities while seated in recliner in Day Room with min distractive environment.  Pt exhibited occasional difficulty locating items on page on computer but demonstrated ability to navigate between pages while carrying on conversation with therapist and speaking to other therapist that entered Day Room.  Focus on selective attention and sitting tolerance in recliner.  Lavone Neri [facility] 05/07/2012, 4:21 PM

## 2012-05-07 NOTE — Patient Care Conference (Signed)
Inpatient RehabilitationTeam Conference Note Date: 05/05/2012   Time: 3:15 PM    Patient Name: Miguel Brady      Medical Record Number: [id number]  Date of Birth: [birth year] Sex: Male         Room/Bed: 4002/4002-01 Payor Info: Payor: MEDICAID PENDING  Plan: MEDICAID PENDING  Product Type: *No Product type*     Admitting Diagnosis: TBI/POLYTRAUMA   Admit Date/Time:  04/22/2012  4:33 PM Admission Comments: No comment available   Primary Diagnosis:  TBI (traumatic brain injury) Principal Problem: TBI (traumatic brain injury)  Patient Active Problem List   Diagnosis Date Noted  . TBI (traumatic brain injury) 04/22/2012  . Traumatic closed fx of eight or more ribs with minimal displacement 04/03/2012  . Pelvic fracture 04/03/2012  . Femur open fracture, left 04/03/2012  . Open left tibial fracture 04/03/2012  . Hemothorax, left 04/03/2012  . Lumbar transverse process fracture 04/03/2012  . Acute blood loss anemia 04/03/2012  . Frontal skull fracture 04/03/2012  . Patella fracture, left 04/03/2012  . SKIN LESION 03/02/2010  . DENTAL CARIES 02/07/2010  . HEADACHE 02/07/2010  . HYPERLIPIDEMIA 01/30/2009  . OBESITY 12/23/2008  . TOBACCO ABUSE 12/23/2008  . 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 DIABETES MELLITUS 09/15/2008  . GERD 05/05/2007  . TRIGGER FINGER 05/05/2007  . INJURY NOS, FINGER 05/05/2007  . ERECTILE DYSFUNCTION, ORGANIC, HX OF 05/05/2007    Expected Discharge Date: Expected Discharge Date:  (plan is SNF)  Team Members Present: Physician: Dr. Faith Rogue Social Worker Present: Amada Jupiter, LCSW Nurse Present: Carlean Purl, RN PT Present: Reggy Eye, PT OT Present: Roney Mans, OT;Ardis Rowan, Sherryl Manges, OT SLP Present: Feliberto Gottron, SLP     Current Status/Progress Goal Weekly Team Focus  Medical   pain an issue. improving cognitively, wound care issue  see above and prior  --   Bowel/Bladder   continent  bowel and bladder uses urinal and staff empties occasional incontinence  lbm 8-15 remains on quetran   continent bowel and bladder   up to toilet and or bedside  for toileting    Swallow/Nutrition/ Hydration   Dys. 3 texturs, thin liquids with intermittent supervision for encouragement of PO intake  Min A  Trials of regular textures    ADL's   tot A LB bathing/dressing; tot A + 2 sliding board treansfers  min to mod A overall   attention, participation, transfers, awareness, sitting tolerance    Mobility   +2 assist, pt 50%  +1 assist  transfers, activity tolerance   Communication   Min-Mod A  Min A  increased utilization of call bell for expression of wants/needs    Safety/Cognition/ Behavioral Observations  Min-Mod A  Min A  selective attention, emergent awareness, functional porblem solving    Pain   left leg pain oxycodone 15-20mg  po every 4 hours   less than 3  monitor effectiveness of pain   Skin   incision to left leg healing sutures out sutures remain in abdomen and left             *See Interdisciplinary Assessment and Plan and progress notes for long and short-term goals  Barriers to Discharge: neuro and ortho deficits    Possible Resolutions to Barriers:  supervision and assist at discharge    Discharge Planning/Teaching Needs:  SNF placement      Team Discussion:  Pt making some gains, however, reaching level that he can transfer to SNF.  SW  to begin process.  Revisions to Treatment Plan:  None   Continued Need for Acute Rehabilitation Level of Care: The patient requires daily medical management by a physician with specialized training in physical medicine and rehabilitation for the following conditions: Daily direction of a multidisciplinary physical rehabilitation program to ensure safe treatment while eliciting the highest outcome that is of practical value to the patient.: Yes Daily medical management of patient stability for increased activity during  participation in an intensive rehabilitation regime.: Yes Daily analysis of laboratory values and/or radiology reports with any subsequent need for medication adjustment of medical intervention for : Neurological problems;Other;Post surgical problems  Amber Williard 05/07/2012, 10:09 AM

## 2012-05-07 NOTE — Progress Notes (Signed)
Social Work Patient ID: Miguel Brady, male   DOB: Sep 06, 1961, 51 y.o.   MRN: [id number]  Have reviewed team conference with patient and mother - aware that this SW to begin process for SNF placement.  Mother pleased with gains that have been made, however, aware that this needs to start.  Pt with concerns about the care he may receive at SNF, however, no alternative plans at this point in his LOC status.  Providing support.  Have requested that neuropsychologist revisit with patient due to reported nightmares and anxiety about d/c.    Miguel Brady

## 2012-05-07 NOTE — Progress Notes (Signed)
Patient ID: Miguel Brady, male   DOB: 02/18/1961, 50 y.o.   MRN: [id number]  Neuropsychology Consult  Per recommendation from the treatment team, I spoke with [name] today to follow up regarding his mood.  According to his treatment team, he has been expressing significant anxiety with ruminating thoughts.  Unfortunately, when I went to see [name], he appeared to be highly sedated from medication.  He was unable to keep his eyes open for more than a few seconds at a time.  He was able to rouse long enough for me to briefly ask him if he had been worried about anything and although he denied being worried, feeling depressed, feeling suicidal, and other broad mood disturbance, he was not able to stay awake long enough for a thorough assessment of his disposition and mood.    I will attempt to see him again next week, if he is still on the unit, and hopefully catch him in a more alert state to do a more accurate assessment.    Leavy Cella, PsyD Clinical Neuropsychologist

## 2012-05-07 NOTE — Progress Notes (Signed)
Physical Therapy Note  Patient Details  Name: Miguel Brady MRN: [id number] Date of Birth: 15-Dec-1960 Today's Date: 05/07/2012  Time: 1300-1355 55 minutes  Pt c/o L LE pain, RN made aware.  Sliding board transfers bed <> w/c with pt able to independently place board and transfer with assist only to steady w/c.  Pt increasing independence with managing L LE throughout transfer and requiring only supervision for w/c parts management.  Pt improving with directing his own care, able to explain how and when to assist him.  W/c mobility throughout hospital >1000' with few rests due to hand soreness.  Pt labile during session, offered emotional support.  Pt with much improved behavior, able to work through pain and discomfort to progress.  Individual therapy   [name] 05/07/2012, 1:53 PM

## 2012-05-08 ENCOUNTER — Inpatient Hospital Stay (HOSPITAL_COMMUNITY): Payer: Medicaid Other

## 2012-05-08 ENCOUNTER — Inpatient Hospital Stay (HOSPITAL_COMMUNITY): Payer: Medicaid Other | Admitting: Physical Therapy

## 2012-05-08 ENCOUNTER — Inpatient Hospital Stay (HOSPITAL_COMMUNITY): Payer: Medicaid Other | Admitting: Occupational Therapy

## 2012-05-08 ENCOUNTER — Inpatient Hospital Stay (HOSPITAL_COMMUNITY): Payer: Medicaid Other | Admitting: Speech Pathology

## 2012-05-08 LAB — GLUCOSE, CAPILLARY: Glucose-Capillary: 99 mg/dL (ref 70–99)

## 2012-05-08 NOTE — Progress Notes (Signed)
Speech Language Pathology Weekly Progress Note  Patient Details  Name: Miguel Brady MRN: [id number] Date of Birth: Feb 04, 1961  Today's Date: 05/08/2012 Time: 1030-1100 Time Calculation (min): 30 min  Pt missed 30 minutes of skilled SLP intervention due to pain  Skilled Therapeutic Intervention: Pt with severe pain demonstrating decreased sustained attention and poor frustration tolerance. Pt was becoming angry with clinician and reported he was unable to participate due to pain. Pt repositioned several times in bed, however, unsuccessful in reducing discomfort. RN made aware and medications given. Treatment session ended early due to lack of participation from pt.   Short Term Goals: Week 2: SLP Short Term Goal 1 (Week 2): Pt will demonstrate functional problem solving for basic and familiar tasks with Mod A verbal and visual cues.  SLP Short Term Goal 1 - Progress (Week 2): Met SLP Short Term Goal 2 (Week 2): Pt will utilize slow rate and over articulation to increase speech intelligibility at the sentence level with supervision verbal cues.  SLP Short Term Goal 2 - Progress (Week 2): Met SLP Short Term Goal 3 (Week 2): Pt will demonstrate topic maintenance for ~ 4 turns with Mod A verbal cues for redirection  SLP Short Term Goal 3 - Progress (Week 2): Met SLP Short Term Goal 4 (Week 2): Pt will demonstrate sustained attention to task for ~15 minutes with Min A verbal cues.  SLP Short Term Goal 4 - Progress (Week 2): Met  New Short Term Goals:  Week 3: SLP Short Term Goal 1 (Week 3): Pt will demonstrate recall of new, daily information with supervision verbal and visual cues.  SLP Short Term Goal 2 (Week 3): Pt will demonstrate selective attention for ~30 minutes for Min A verbal cues for redirection. SLP Short Term Goal 3 (Week 3): Pt will demonstrate functional problem solving for basic and familiar tasks with supervision verbal cues SLP Short Term Goal 4 (Week 3): Pt will maintain  a topic during functional conversation for ~4 turns with Min A verbal and question cues for redirection   Weekly Progress Updates: Pt has made functional gains and has met 4 out of 4 STG's this reporting period. Currently, pt is demonstrating behaviors consistent with a Rancho Level VI and is overall Min A for functional problem solving, sustained attention, emergent awareness and working memory. Pt is consuming regular textures and thin liquids and is Mod I for utilization of swallowing compensatory strategies. Pt's overall cognitive function has improved but can also be impacted by pt's pain, participation and decreased frustration tolerance. Pt would benefit from continued skilled SLP intervention to maximize cognitive function and overall independence.    SLP Frequency: 1-2 X/day, 30-60 minutes Estimated Length of Stay: TBD SLP Treatment/Interventions: Cognitive remediation/compensation;Internal/external aids;Cueing hierarchy;Environmental controls;Functional tasks;Patient/family education;Therapeutic Activities  Daily Session FIM:  Comprehension Comprehension Mode: Auditory Comprehension: 5-Understands complex 90% of the time/Cues < 10% of the time Expression Expression Mode: Verbal Expression: 5-Expresses basic 90% of the time/requires cueing < 10% of the time. Social Interaction Social Interaction: 5-Interacts appropriately 90% of the time - Needs monitoring or encouragement for participation or interaction. Problem Solving Problem Solving: 4-Solves basic 75 - 89% of the time/requires cueing 10 - 24% of the time Memory Memory: 4-Recognizes or recalls 75 - 89% of the time/requires cueing 10 - 24% of the time General  Amount of Missed SLP Time (min): 30 Minutes Missed Time Reason: Pain Pain 9/10 in pelvis and back, pt repositioned and RN made aware  Therapy/Group: Individual  Therapy  Rory Montel 05/08/2012, 4:01 PM

## 2012-05-08 NOTE — Plan of Care (Signed)
Problem: RH BOWEL ELIMINATION Goal: RH STG MANAGE BOWEL W/EQUIPMENT W/ASSISTANCE STG Manage Bowel With Equipment With moderate Assistance  Outcome: Progressing Patient transfers with therapy with equipment more often than with nursing .

## 2012-05-08 NOTE — Progress Notes (Signed)
Occupational Therapy Note  Patient Details  Name: Miguel Brady MRN: [id number] Date of Birth: 1961/02/07 Today's Date: 05/08/2012  Time: 0900-1000 Pt initially denied pain but c/o increasing pain in LLE with continued activity; repositoined Individual Therapy  AE (reacher, long handle sponge, sock aide) issued to patient to assist with ADLs.  Demonstrated use of sock aide and patient return demonstrated.  Pt performed w/c<>mat transfer with transfer board at supervision level.  Focus on transfers, bed mobility, AE use, activity tolerance, w/c mobility, and safety awareness.   Lavone Neri [facility] 05/08/2012, 3:00 PM

## 2012-05-08 NOTE — Progress Notes (Signed)
Subjective/Complaints: Working with PT  A 12 point review of systems has been performed and if not noted above is otherwise negative.   Objective: Vital Signs: Blood pressure 125/87, pulse 85, temperature 97.8 F (36.6 C), temperature source Oral, resp. rate 18, weight 118.2 kg (260 lb 9.3 oz), SpO2 95.00%. No results found. No results found for this basename: WBC:2,HGB:2,HCT:2,PLT:2 in the last 72 hours  Basename 05/06/12 0733  NA --  K --  CL --  CO2 --  GLUCOSE --  BUN --  CREATININE 0.76  CALCIUM --   CBG (last 3)   Basename 05/08/12 0725 05/07/12 2045 05/07/12 1630  GLUCAP 99 103* 128*    Wt Readings from Last 3 Encounters:  05/08/12 118.2 kg (260 lb 9.3 oz)  04/22/12 134.5 kg (296 lb 8.3 oz)  04/22/12 134.5 kg (296 lb 8.3 oz)    Physical Exam:   General: Alert and oriented x 3, No apparent distress.obese HEENT: Head is normocephalic, atraumatic, PERRLA, EOMI, sclera anicteric, oral mucosa pink and moist, dentition intact, ext ear canals clear,  Neck: Supple without JVD or lymphadenopathy Heart: Reg rate and rhythm. No murmurs rubs or gallops Chest: CTA bilaterally without wheezes, rales, or rhonchi; no distress Abdomen: Soft, non-tender, non-distended, bowel sounds positive. Extremities: No clubbing, cyanosis, or edema. Pulses are 2+.  Knee incision CDI sutures out Skin:  A few reamaining sutures in the groin/inguinal areas Neuro: alert, poor insight and awareness. . Able to follow simple one step commands. Cranial nerves 2-12 are intact. Sensory exam is normal except for left leg. Reflexes are 1 to 2+ in all 4's. Fine motor coordination is intact. No tremors. Motor function is grossly 4 in the UE. RLE is 2 prox to 4 distally. LLE not tested proximally, he 1-2/5 strength in the foot Musculoskeletal: left leg in KI. Wrapped. Psych: . Pt is cooperative      Assessment/Plan: 1. 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 Physiatrist is providing close team supervision and 24 hour management of active medical problems listed below. Physiatrist and rehab team continue to assess barriers to discharge/monitor patient progress toward functional and medical goals. FIM: FIM - Bathing Bathing Steps Patient Completed: Chest;Right Arm;Left Arm;Abdomen;Front perineal area;Right upper leg Bathing:  (refused)  FIM - Upper Body Dressing/Undressing Upper body dressing/undressing steps patient completed: Thread/unthread left sleeve of pullover shirt/dress;Thread/unthread right sleeve of pullover shirt/dresss;Put head through opening of pull over shirt/dress;Pull shirt over trunk Upper body dressing/undressing: 5: Set-up assist to: Obtain clothing/put away FIM - Lower Body Dressing/Undressing Lower body dressing/undressing steps patient completed: Thread/unthread right pants leg Lower body dressing/undressing: 1: Total-Patient completed less than 25% of tasks  FIM - Toileting Toileting: 0: Activity did not occur  FIM - Diplomatic Services operational officer Devices: Psychiatrist Transfers: 0-Activity did not occur  FIM - Banker Devices: Sliding board Bed/Chair Transfer: 4: Bed > Chair or W/C: Min A (steadying Pt. > 75%)  FIM - Locomotion: Wheelchair Locomotion: Wheelchair: 2: Travels 50 - 149 ft with supervision, cueing or coaxing FIM - Locomotion: Ambulation Ambulation/Gait Assistance: Not tested (comment) Locomotion: Ambulation: 0: Activity did not occur  Comprehension Comprehension Mode: Auditory Comprehension: 5-Understands basic 90% of the time/requires cueing < 10% of the time  Expression Expression Mode: Verbal Expression: 5-Expresses complex 90% of the time/cues < 10% of the time  Social Interaction Social Interaction: 5-Interacts appropriately  90% of the time - Needs  monitoring or encouragement for participation or interaction.  Problem Solving Problem Solving Mode: Not assessed Problem Solving: 5-Solves basic 90% of the time/requires cueing < 10% of the time  Memory Memory: 4-Recognizes or recalls 75 - 89% of the time/requires cueing 10 - 24% of the time Medical Problem List and Plan:  1. DVT Prophylaxis/Anticoagulation: Pharmaceutical: Lovenox  2. Pain Management: tylenol and oxycodone. Monitor for pain control with increased activity  -neurontin for neuro pain- now at 300 tid  -fentanyl  3. Mood: seroquel at decresed dose and klonopin for mood stabilization and To assist with sleep. . Trazodone prn as well .   4 Neuropsych: This patient is not capable of making decisions on his/her own behalf.  5. Serratia bronchitis: resolved 6. Leucocytosis: see above. Follow clinically, --no  Active signs of infection. 7. DM type 2: glucophage and sliding scale insulin with reasonable control  8. ABLA: follow serial cbc's. Multivitamin with iron.  9. 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 Is NWB LLE and WBAT RLE for transfer only 10.  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  HTN will increased metoprolol LOS (Days) 16 A FACE TO FACE EVALUATION WAS PERFORMED  SWARTZ,ZACHARY T 05/08/2012, 8:35 AM

## 2012-05-08 NOTE — Progress Notes (Signed)
Physical Therapy Note  Patient Details  Name: Miguel Brady MRN: [id number] Date of Birth: 1960-12-14 Today's Date: 05/08/2012  Time: 800-855 55 minutes  C/o L LE pain only with movement.  Treatment focused on pt increasing independence with bed mobility, wt shifts in sitting, sliding board transfers for functional bathing/dressing activities.  Pt able to direct care throughout session, able to perform bed mobility, rolling, wt shifting with min A for L LE management only.  Improved attention to task requiring only min cues to keep on topic and task throughout session.  Pt performed sliding board transfers with min A for board placement, min cuing to direct w/c set up.  Pt continues to improve both physically and cognitively.  Individual therapy   Tanaysha Alkins 05/08/2012, 8:58 AM

## 2012-05-08 NOTE — Progress Notes (Signed)
Occupational Therapy Session Note  Patient Details  Name: Miguel Brady MRN: [id number] Date of Birth: Nov 02, 1960  Today's Date: 05/08/2012 Time: 2952-8413 Time Calculation (min): 70 min  Short Term Goals: Week 3:  OT Short Term Goal 1 (Week 3): Pt will incorporate reacher to assist with threading pants over legs. OT Short Term Goal 2 (Week 3): Pt will perform slide board transfer to/from uneven surface with min A and +2 to hold s/c OT Short Term Goal 3 (Week 3): Pt will pull pants up seated EOB with mod A incorporating lateral leans OT Short Term Goal 4 (Week 3): Pt will direct doffing/donning of LLE brace with supervision  Skilled Therapeutic Interventions/Progress Updates:    1:1 focus on bed mobility to come to EOB with only Assistance for left LE (came off the bed on the left side), slide board placement without Assistance to transfer into w/c with minimal assistance to manage left LE, propelled self down to 1st floor without assistance, focus on propelling self on uneven ground outdoors - down the front of the  Building to the [facility] doors - pt needed min A downhill with control of w/c. Pt demonstrated good attention, problem solving, participate in goal setting.  Therapy Documentation Precautions:  Precautions Precautions: Fall Precaution Comments: WBAT on RLE for transfers only, no active ext on LLE Required Braces or Orthoses: Other Brace/Splint Other Brace/Splint: knee brace L locked in extension, no active knee extension but full ROM at hip Restrictions Weight Bearing Restrictions: Yes RLE Weight Bearing: Weight bearing as tolerated LLE Weight Bearing: Non weight bearing Pain: Pain in Left LE and pelvis- RN aware scheduled meds given See FIM for current functional status  Therapy/Group: Individual Therapy  Roney [facility] 05/08/2012, 3:31 PM

## 2012-05-09 ENCOUNTER — Inpatient Hospital Stay (HOSPITAL_COMMUNITY): Payer: Medicaid Other | Admitting: Physical Therapy

## 2012-05-09 LAB — GLUCOSE, CAPILLARY
Glucose-Capillary: 102 mg/dL — ABNORMAL HIGH (ref 70–99)
Glucose-Capillary: 99 mg/dL (ref 70–99)
Glucose-Capillary: 117 mg/dL — ABNORMAL HIGH (ref 70–99)

## 2012-05-09 MED ORDER — GABAPENTIN 300 MG PO CAPS
600.0000 mg | ORAL_CAPSULE | Freq: Three times a day (TID) | ORAL | Status: DC
  Administered 2012-05-09 – 2012-05-10 (×6): 600 mg via ORAL
  Filled 2012-05-09 (×10): qty 2

## 2012-05-09 NOTE — Progress Notes (Signed)
Subjective/Complaints: Left foot still painful. More tender when it becomes rotated in prafo or when it dangles. A 12 point review of systems has been performed and if not noted above is otherwise negative.   Objective: Vital Signs: Blood pressure 127/84, pulse 86, temperature 97.9 F (36.6 C), temperature source Oral, resp. rate 18, weight 120.7 kg (266 lb 1.5 oz), SpO2 92.00%. No results found. No results found for this basename: WBC:2,HGB:2,HCT:2,PLT:2 in the last 72 hours No results found for this basename: NA:2,K:2,CL:2,CO2:2,GLUCOSE:2,BUN:2,CREATININE:2,CALCIUM:2 in the last 72 hours CBG (last 3)   Basename 05/09/12 0722 05/08/12 2131 05/08/12 1713  GLUCAP 102* 103* 94    Wt Readings from Last 3 Encounters:  05/09/12 120.7 kg (266 lb 1.5 oz)  04/22/12 134.5 kg (296 lb 8.3 oz)  04/22/12 134.5 kg (296 lb 8.3 oz)    Physical Exam:   General: Alert and oriented x 3, No apparent distress.obese HEENT: Head is normocephalic, atraumatic, PERRLA, EOMI, sclera anicteric, oral mucosa pink and moist, dentition intact, ext ear canals clear,  Neck: Supple without JVD or lymphadenopathy Heart: Reg rate and rhythm. No murmurs rubs or gallops Chest: CTA bilaterally without wheezes, rales, or rhonchi; no distress Abdomen: Soft, non-tender, non-distended, bowel sounds positive. Extremities: No clubbing, cyanosis, or edema. Pulses are 2+.  Knee incision CDI sutures out Skin:  A few reamaining sutures in the groin/inguinal areas Neuro: alert, poor insight and awareness. . Able to follow simple one step commands. Cranial nerves 2-12 are intact. Sensory exam is normal except for left leg. Reflexes are 1 to 2+ in all 4's. Fine motor coordination is intact. No tremors. Motor function is grossly 4 in the UE. RLE is 2 prox to 4 distally. LLE not tested proximally, he 1-2/5 strength in the foot. Foot remains tender to palpation t Musculoskeletal: left leg in KI. Wrapped. Psych: . Pt is cooperative      Assessment/Plan: 1. 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 Physiatrist is providing close team supervision and 24 hour management of active medical problems listed below. Physiatrist and rehab team continue to assess barriers to discharge/monitor patient progress toward functional and medical goals. FIM: FIM - Bathing Bathing Steps Patient Completed: Chest;Right Arm;Left Arm;Abdomen;Front perineal area;Right upper leg Bathing:  (refused)  FIM - Upper Body Dressing/Undressing Upper body dressing/undressing steps patient completed: Thread/unthread left sleeve of pullover shirt/dress;Thread/unthread right sleeve of pullover shirt/dresss;Put head through opening of pull over shirt/dress;Pull shirt over trunk Upper body dressing/undressing: 5: Set-up assist to: Obtain clothing/put away FIM - Lower Body Dressing/Undressing Lower body dressing/undressing steps patient completed: Thread/unthread right pants leg Lower body dressing/undressing: 1: Total-Patient completed less than 25% of tasks  FIM - Toileting Toileting: 0: Activity did not occur  FIM - Diplomatic Services operational officer Devices: Psychiatrist Transfers: 0-Activity did not occur  FIM - Architectural technologist Transfer: 4: Chair or W/C > Bed: Min A (steadying Pt. > 75%);4: Bed > Chair or W/C: Min A (steadying Pt. > 75%);4: Sit > Supine: Min A (steadying pt. > 75%/lift 1 leg);4: Supine > Sit: Min A (steadying Pt. > 75%/lift 1 leg)  FIM - Locomotion: Wheelchair Locomotion: Wheelchair: 2: Travels 50 - 149 ft with supervision, cueing or coaxing FIM - Locomotion: Ambulation Ambulation/Gait Assistance: Not tested (comment) Locomotion: Ambulation: 0: Activity did not occur  Comprehension Comprehension Mode: Auditory Comprehension:  5-Understands complex 90%  of the time/Cues < 10% of the time  Expression Expression Mode: Verbal Expression: 5-Expresses complex 90% of the time/cues < 10% of the time  Social Interaction Social Interaction: 5-Interacts appropriately 90% of the time - Needs monitoring or encouragement for participation or interaction.  Problem Solving Problem Solving Mode: Not assessed Problem Solving: 4-Solves basic 75 - 89% of the time/requires cueing 10 - 24% of the time  Memory Memory: 4-Recognizes or recalls 75 - 89% of the time/requires cueing 10 - 24% of the time Medical Problem List and Plan:  1. DVT Prophylaxis/Anticoagulation: Pharmaceutical: Lovenox  2. Pain Management: tylenol and oxycodone. Monitor for pain control with increased activity  -neurontin for neuro pain- will increase to 600 tid  -fentanyl  3. Mood: seroquel at decresed dose and klonopin for mood stabilization and To assist with sleep. . Trazodone prn as well .   4 Neuropsych: This patient is not capable of making decisions on his/her own behalf.  5. Serratia bronchitis: resolved 6. Leucocytosis: see above. Follow clinically, --no  Active signs of infection. 7. DM type 2: glucophage and sliding scale insulin with reasonable control  8. ABLA: follow serial cbc's. Multivitamin with iron.  9. 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 Is NWB LLE and WBAT RLE for transfer only 10.  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  HTN will increased metoprolol LOS (Days) 17 A FACE TO FACE EVALUATION WAS PERFORMED  Miguel Brady T 05/09/2012, 7:55 AM

## 2012-05-09 NOTE — Progress Notes (Signed)
Physical Therapy Session Note  Patient Details  Name: Miguel Brady MRN: [id number] Date of Birth: Feb 01, 1961  Today's Date: 05/09/2012 Time: 1515-1600 Time Calculation (min): 45 min  Short Term Goals: Week 2:  PT Short Term Goal 1 (Week 2): Pt will attend to functional task in quiet environment x 2 minutes PT Short Term Goal 1 - Progress (Week 2): Met PT Short Term Goal 2 (Week 2): Pt will perform rolling in bed with max A PT Short Term Goal 2 - Progress (Week 2): Met PT Short Term Goal 3 (Week 2): Pt will assist 50% with transfers bed <> chair PT Short Term Goal 3 - Progress (Week 2): Met  Skilled Therapeutic Interventions/Progress Updates:    Session primarily focused on transfer training and activity tolerance. Performed sliding board transfers bed > wheelchair >mat>wheelchair all performed with supervision. Pt directing care well, verbalizes appropriate positioning of wheelchair and parts management. Pt continues to need assist with Lt. LE for bed mobility.   Pt still with reports of room spinning dizziness that occurs when going to side lying as well as sudden head movements in sitting. Performed Rt. hallpike dix testing (very slowly secondary to pain). Definite Rt. torsional (? Downbeating) nystagmus noted. Nystagmus delayed onset and of short duration likely pointing to Rt. Canalithiasis BPPV. Pt unable to tolerate position long enough to determine whether posterior or anterior canal involvement. Pt unable to tolerate CRP treatment at this time secondary to pelvic and low back pain. Recommend pt see vestibular specialist (HH or OP) once he can tolerate rolling. Will attempt to work on modifying treatment methods to see if benefit can be found prior to full CRP.   Therapy Documentation Precautions:  Precautions Precautions: Fall Precaution Comments: WBAT on RLE for transfers only, no active ext on LLE Required Braces or Orthoses: Other Brace/Splint Other Brace/Splint: knee  brace L locked in extension, no active knee extension but full ROM at hip Restrictions Weight Bearing Restrictions: Yes RLE Weight Bearing: Weight bearing as tolerated LLE Weight Bearing: Non weight bearing Pain: Pain Assessment Pain Assessment: 0-10 Pain Score:   6 Pain Type: Acute pain Pain Location: Pelvis Pain Orientation: Right Pain Descriptors: Aching Pain Frequency: Intermittent Pain Onset: With Activity Pain Intervention(s): Repositioned;Other (Comment) (pain resolved with repositioning and rest) Locomotion : Wheelchair Mobility Distance: 2 x 100'   See FIM for current functional status  Therapy/Group: Individual Therapy  Wilhemina Bonito 05/09/2012, 5:15 PM

## 2012-05-10 ENCOUNTER — Inpatient Hospital Stay (HOSPITAL_COMMUNITY): Payer: Medicaid Other

## 2012-05-10 LAB — GLUCOSE, CAPILLARY
Glucose-Capillary: 113 mg/dL — ABNORMAL HIGH (ref 70–99)
Glucose-Capillary: 97 mg/dL (ref 70–99)
Glucose-Capillary: 111 mg/dL — ABNORMAL HIGH (ref 70–99)

## 2012-05-10 LAB — CLOSTRIDIUM DIFFICILE BY PCR: Toxigenic C. Difficile by PCR: NEGATIVE

## 2012-05-10 NOTE — Progress Notes (Signed)
Subjective/Complaints: Pain better. Slept well. Wants to get out of bed. A 12 point review of systems has been performed and if not noted above is otherwise negative.   Objective: Vital Signs: Blood pressure 140/81, pulse 91, temperature 98.4 F (36.9 C), temperature source Oral, resp. rate 19, weight 119.8 kg (264 lb 1.8 oz), SpO2 96.00%. No results found. No results found for this basename: WBC:2,HGB:2,HCT:2,PLT:2 in the last 72 hours No results found for this basename: NA:2,K:2,CL:2,CO2:2,GLUCOSE:2,BUN:2,CREATININE:2,CALCIUM:2 in the last 72 hours CBG (last 3)   Basename 05/09/12 2124 05/09/12 1652 05/09/12 1149  GLUCAP 124* 117* 99    Wt Readings from Last 3 Encounters:  05/10/12 119.8 kg (264 lb 1.8 oz)  04/22/12 134.5 kg (296 lb 8.3 oz)  04/22/12 134.5 kg (296 lb 8.3 oz)    Physical Exam:   General: Alert and oriented x 3, No apparent distress.obese HEENT: Head is normocephalic, atraumatic, PERRLA, EOMI, sclera anicteric, oral mucosa pink and moist, dentition intact, ext ear canals clear,  Neck: Supple without JVD or lymphadenopathy Heart: Reg rate and rhythm. No murmurs rubs or gallops Chest: CTA bilaterally without wheezes, rales, or rhonchi; no distress Abdomen: Soft, non-tender, non-distended, bowel sounds positive. Extremities: No clubbing, cyanosis, or edema. Pulses are 2+.  Knee incision CDI sutures out Skin:  A few reamaining sutures in the groin/inguinal areas Neuro: alert, poor insight and awareness. . Able to follow simple one step commands. Cranial nerves 2-12 are intact. Sensory exam is normal except for left leg. Reflexes are 1 to 2+ in all 4's. Fine motor coordination is intact. No tremors. Motor function is grossly 4 in the UE. RLE is 2 prox to 4 distally. LLE not tested proximally, he 1-2/5 strength in the foot. Foot remains tender to palpation t Musculoskeletal: left leg in KI. Wrapped. Psych: . Pt is cooperative      Assessment/Plan: 1. Functional  deficits secondary to TBI and polytrauma as below which require 3+ hours per day of interdisciplinary therapy in a comprehensive inpatient rehab setting. Physiatrist is providing close team supervision and 24 hour management of active medical problems listed below. Physiatrist and rehab team continue to assess barriers to discharge/monitor patient progress toward functional and medical goals. FIM: FIM - Bathing Bathing Steps Patient Completed: Chest;Right Arm;Left Arm;Abdomen;Front perineal area;Right upper leg;Left upper leg Bathing: 3: Mod-Patient completes 5-7 87f 10 parts or 50-74%  FIM - Upper Body Dressing/Undressing Upper body dressing/undressing steps patient completed: Thread/unthread right sleeve of pullover shirt/dresss;Thread/unthread left sleeve of pullover shirt/dress;Put head through opening of pull over shirt/dress;Pull shirt over trunk Upper body dressing/undressing: 5: Set-up assist to: Obtain clothing/put away FIM - Lower Body Dressing/Undressing Lower body dressing/undressing steps patient completed: Pull pants up/down Lower body dressing/undressing: 1: Total-Patient completed less than 25% of tasks  FIM - Toileting Toileting: 0: Activity did not occur  FIM - Diplomatic Services operational officer Devices: Psychiatrist Transfers: 0-Activity did not occur  FIM - Banker Devices: Sliding board Bed/Chair Transfer: 4: Supine > Sit: Min A (steadying Pt. > 75%/lift 1 leg);4: Sit > Supine: Min A (steadying pt. > 75%/lift 1 leg);5: Bed > Chair or W/C: Supervision (verbal cues/safety issues);5: Chair or W/C > Bed: Supervision (verbal cues/safety issues)  FIM - Locomotion: Wheelchair Distance: 2 x 100' Locomotion: Wheelchair: 2: Travels 50 - 149 ft with supervision, cueing or coaxing FIM - Locomotion: Ambulation Ambulation/Gait Assistance: Not tested (comment) Locomotion: Ambulation: 0: Activity did not  occur  Comprehension Comprehension Mode: Auditory Comprehension:  5-Understands basic 90% of the time/requires cueing < 10% of the time  Expression Expression Mode: Verbal Expression: 5-Expresses complex 90% of the time/cues < 10% of the time  Social Interaction Social Interaction: 5-Interacts appropriately 90% of the time - Needs monitoring or encouragement for participation or interaction.  Problem Solving Problem Solving Mode: Not assessed Problem Solving: 4-Solves basic 75 - 89% of the time/requires cueing 10 - 24% of the time  Memory Memory: 4-Recognizes or recalls 75 - 89% of the time/requires cueing 10 - 24% of the time Medical Problem List and Plan:  1. DVT Prophylaxis/Anticoagulation: Pharmaceutical: Lovenox  2. Pain Management: tylenol and oxycodone. Monitor for pain control with increased activity  -neurontin for neuro pain- increased to 600 tid  -fentanyl  3. Mood: seroquel at decresed dose and klonopin for mood stabilization and To assist with sleep. . Trazodone prn as well .   4 Neuropsych: This patient is not capable of making decisions on his/her own behalf.  5. Serratia bronchitis: resolved 6. Leucocytosis: see above. Follow clinically, --no  Active signs of infection. 7. DM type 2: glucophage and sliding scale insulin with reasonable control  8. ABLA: follow serial cbc's. Multivitamin with iron.  9. 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 Is NWB LLE and WBAT RLE for transfer only 10.  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  HTN will increased metoprolol LOS (Days) 18 A FACE TO FACE EVALUATION WAS PERFORMED  SWARTZ,ZACHARY T 05/10/2012, 7:33 AM

## 2012-05-10 NOTE — Progress Notes (Signed)
Occupational Therapy Session Note  Patient Details  Name: Miguel Brady MRN: [id number] Date of Birth: [birth year]  Today's Date: 05/10/2012 Time: 0900-0950 Time Calculation (min): 50 min  Short Term Goals: Week 3:  OT Short Term Goal 1 (Week 3): Pt will incorporate reacher to assist with threading pants over legs. OT Short Term Goal 2 (Week 3): Pt will perform slide board transfer to/from uneven surface with min A and +2 to hold s/c OT Short Term Goal 3 (Week 3): Pt will pull pants up seated EOB with mod A incorporating lateral leans OT Short Term Goal 4 (Week 3): Pt will direct doffing/donning of LLE brace with supervision  Skilled Therapeutic Interventions:  ADL-retraining at bed level with emphasis on sequencing, maintaining precautions with right LE, doffing/donning LLE brace, and effective use of DME/AE.   Patient able to provide accurate and thorough directions on setup assist with ADL as well as LLE brace.   Patient completes assisted bed mobility, side-to-side for bathing his buttocks and only required total assist to bathe right lower leg due to movement precautions.   Patient's pace and thoroughness with upper and lower body bathing and skin care limited ADL this date to just upper and lower body bathing (assisted) and assisted lower body dressing (to lace left leg through pants and assist with pulling up pants while supine) which lasted until treatment time was exhausted.  Therapy Documentation Precautions:  Precautions Precautions: Fall Precaution Comments: WBAT on RLE for transfers only, no active ext on LLE Required Braces or Orthoses: Other Brace/Splint Other Brace/Splint: knee brace L locked in extension, no active knee extension but full ROM at hip Restrictions Weight Bearing Restrictions: Yes RLE Weight Bearing: Weight bearing as tolerated LLE Weight Bearing: Non weight bearing  Pain: Pain Assessment Pain Score: 0-No pain   Therapy/Group: Individual  Therapy  Georgeanne Nim 05/10/2012, 12:15 PM

## 2012-05-11 ENCOUNTER — Inpatient Hospital Stay (HOSPITAL_COMMUNITY): Payer: Medicaid Other | Admitting: Occupational Therapy

## 2012-05-11 ENCOUNTER — Inpatient Hospital Stay (HOSPITAL_COMMUNITY): Payer: Medicaid Other | Admitting: *Deleted

## 2012-05-11 ENCOUNTER — Inpatient Hospital Stay (HOSPITAL_COMMUNITY): Payer: Medicaid Other | Admitting: Speech Pathology

## 2012-05-11 ENCOUNTER — Inpatient Hospital Stay (HOSPITAL_COMMUNITY): Payer: Medicaid Other

## 2012-05-11 DIAGNOSIS — Z5189 Encounter for other specified aftercare: Secondary | ICD-10-CM

## 2012-05-11 DIAGNOSIS — S329XXA Fracture of unspecified parts of lumbosacral spine and pelvis, initial encounter for closed fracture: Secondary | ICD-10-CM

## 2012-05-11 DIAGNOSIS — S069X9A Unspecified intracranial injury with loss of consciousness of unspecified duration, initial encounter: Secondary | ICD-10-CM

## 2012-05-11 DIAGNOSIS — S72009A Fracture of unspecified part of neck of unspecified femur, initial encounter for closed fracture: Secondary | ICD-10-CM

## 2012-05-11 DIAGNOSIS — S82899A Other fracture of unspecified lower leg, initial encounter for closed fracture: Secondary | ICD-10-CM

## 2012-05-11 LAB — GLUCOSE, CAPILLARY
Glucose-Capillary: 130 mg/dL — ABNORMAL HIGH (ref 70–99)
Glucose-Capillary: 131 mg/dL — ABNORMAL HIGH (ref 70–99)
Glucose-Capillary: 103 mg/dL — ABNORMAL HIGH (ref 70–99)

## 2012-05-11 MED ORDER — GABAPENTIN 600 MG PO TABS
600.0000 mg | ORAL_TABLET | Freq: Three times a day (TID) | ORAL | Status: DC
  Administered 2012-05-11 – 2012-05-13 (×7): 600 mg via ORAL
  Filled 2012-05-11 (×10): qty 1

## 2012-05-11 NOTE — Progress Notes (Signed)
Speech Language Pathology Daily Session Note  Patient Details  Name: Miguel Brady MRN: [id number] Date of Birth: [birth year]  Today's Date: 05/11/2012 Time: 1100-1200 Time Calculation (min): 60 min  Short Term Goals: Week 3: SLP Short Term Goal 1 (Week 3): Pt will demonstrate recall of new, daily information with supervision verbal and visual cues.  SLP Short Term Goal 2 (Week 3): Pt will demonstrate selective attention for ~30 minutes for Min A verbal cues for redirection. SLP Short Term Goal 3 (Week 3): Pt will demonstrate functional problem solving for basic and familiar tasks with supervision verbal cues SLP Short Term Goal 4 (Week 3): Pt will maintain a topic during functional conversation for ~4 turns with Min A verbal and question cues for redirection   Skilled Therapeutic Interventions: Treatment focus on short-term memory, functional problem solving and organization. Pt required supervision verbal cues for recall of directions to card game and required Min semantic and verbal cues with extra time for functional problem solving and organization with complex task. Pt able to direct care in regards to fixing his leg brace with supervision question cues.    FIM:  Comprehension Comprehension Mode: Auditory Comprehension: 5-Understands complex 90% of the time/Cues < 10% of the time Expression Expression Mode: Verbal Expression: 5-Expresses complex 90% of the time/cues < 10% of the time Social Interaction Social Interaction: 5-Interacts appropriately 90% of the time - Needs monitoring or encouragement for participation or interaction. Problem Solving Problem Solving: 5-Solves basic 90% of the time/requires cueing < 10% of the time Memory Memory: 5-Recognizes or recalls 90% of the time/requires cueing < 10% of the time  Pain Pain Assessment Pain Assessment: 0-10 Pain Score:   7 Pain Type: Acute pain Pain Location: Leg Pain Descriptors: Aching;Discomfort Pain Frequency:  Occasional Pain Onset: Gradual Patients Stated Pain Goal: 2 Pain Intervention(s): Medication (See eMAR) (oxycodone 15mg  po)  Therapy/Group: Individual Therapy  Curly Mackowski 05/11/2012, 4:17 PM

## 2012-05-11 NOTE — Progress Notes (Signed)
Physical Therapy Session Note  Patient Details  Name: RACIEL CAFFREY MRN: [id number] Date of Birth: 1961-04-22  Today's Date: 05/11/2012 Time: 8:20-9:15 Time Calculation (min):   Skilled Therapeutic Interventions/Progress Updates:  Tx focused on WC mobility, sliding board transfers, bed mobility, and increasing activity tolerance with unsupported sitting tasks.  Pt propelled WC in tight spaces and around turns 1x100' on tile and carpet surfaces with S only.  Pt was able to place and remove sliding board and perform transfer with S only WC>mat, but was given cues for most effective and safe board placement. Pt required Min A for supporting LLE mat>WC due to fatigue and pain by end of tx.  Pt able to demonstrate good technique to lift and carry LLE with R for lateral scooting on mat and to initiate sit<>supine, but needing lifting/lowering assist to complete transfer. Pt initially reaching to pull on therapist from supine>sit but was encouraged to perform more independently with raising up to elbows, then hands. Pt frustrated with suggestion, but able to complete.  Unsupported sitting x20 min with 3 supine rest breaks due to fatigue throughout.  Pt participated in reaching for and tossing horseshoes with math recall for score. Also performed bating beach ball with racquet, which was more fast paced and very fatiguing for pt by end of tx.        Therapy Documentation Precautions:  Precautions Precautions: Fall Precaution Comments: WBAT on RLE for transfers only, no active ext on LLE Required Braces or Orthoses: Other Brace/Splint Other Brace/Splint: knee brace L locked in extension, no active knee extension but full ROM at hip Restrictions Weight Bearing Restrictions: Yes RLE Weight Bearing: Weight bearing as tolerated LLE Weight Bearing: Non weight bearing  Pain: 5-6/10, pt premedicated and modified tx and positioning as needed  Locomotion : Wheelchair Mobility Distance:  100      See FIM for current functional status  Therapy/Group: Individual Therapy  Virl Cagey, PT 05/11/2012, 8:58 AM

## 2012-05-11 NOTE — Progress Notes (Signed)
Occupational Therapy Session Note  Patient Details  Name: Miguel Brady MRN: [id number] Date of Birth: [birth year]  Today's Date: 05/11/2012 Time: 0700-0800 Time Calculation (min): 60 min  Short Term Goals: Week 3:  OT Short Term Goal 1 (Week 3): Pt will incorporate reacher to assist with threading pants over legs. OT Short Term Goal 2 (Week 3): Pt will perform slide board transfer to/from uneven surface with min A and +2 to hold s/c OT Short Term Goal 3 (Week 3): Pt will pull pants up seated EOB with mod A incorporating lateral leans OT Short Term Goal 4 (Week 3): Pt will direct doffing/donning of LLE brace with supervision  Skilled Therapeutic Interventions/Progress Updates:    Pt in bed asleep but easily aroused and agreeable to completing bathing and dressing tasks seated EOB with focus on bed mobility and activity tolerance.  Pt required assistance positioning LLE during supine>sit to complete tasks.  Pt required assistance with bathing buttocks but able to position self on bed and roll to side to allow therapist to bathe buttocks.  Pt assisted with doffing and donning LLE brace to allow for cleaning underneath.  Pt assisted with repositioning LLE brace prior to donning pants.  Pt performed sliding board transfer to w/c with supervision after board placed.    Therapy Documentation Precautions:  Precautions Precautions: Fall Precaution Comments: WBAT on RLE for transfers only, no active ext on LLE Required Braces or Orthoses: Other Brace/Splint Other Brace/Splint: knee brace L locked in extension, no active knee extension but full ROM at hip Restrictions Weight Bearing Restrictions: Yes RLE Weight Bearing: Weight bearing as tolerated LLE Weight Bearing: Non weight bearing General:   Pain: Pain Assessment Pain Assessment: 0-10 Pain Score:   5 Pain Type: Acute pain Pain Location: Leg Pain Orientation: Left Pain Descriptors: Aching Pain Onset: On-going Patients Stated Pain  Goal: 2 Pain Intervention(s): Repositioned  See FIM for current functional status  Therapy/Group: Individual Therapy  Rich Brave 05/11/2012, 8:08 AM

## 2012-05-11 NOTE — Progress Notes (Signed)
Occupational Therapy Session Note  Patient Details  Name: Miguel Brady MRN: [id number] Date of Birth: [birth year]  Today's Date: 05/11/2012 Time: 1191-4782 Time Calculation (min): 60 min  Short Term Goals: Week 3:  OT Short Term Goal 1 (Week 3): Pt will incorporate reacher to assist with threading pants over legs. OT Short Term Goal 2 (Week 3): Pt will perform slide board transfer to/from uneven surface with min A and +2 to hold s/c OT Short Term Goal 3 (Week 3): Pt will pull pants up seated EOB with mod A incorporating lateral leans OT Short Term Goal 4 (Week 3): Pt will direct doffing/donning of LLE brace with supervision  Skilled Therapeutic Interventions/Progress Updates:    1:1 Pt in bed when arrived with PA inspecting LE's wounds. Assisted with washing LE and reapplying Brace. Performed 15 ankle pumps with trying to achieve full ROM (as possible), once bilateral LE's pants threaded pt able to roll laterally to pull up pants. Pt needed mod cuing to verbalize methods to better assist patient. Able to come up to EOB with min A - dizziness with positional head turns persists. Slide board transfer with min A into recliner. Perform peach theraband exercises to address strengthening in prep for transfers and propelling w/c (10 reps exercises (bicep, tricep, shoulder adduction, shoulder flexion and shoulder adbuction   Therapy Documentation Precautions:  Precautions Precautions: Fall Precaution Comments: WBAT on RLE for transfers only, no active ext on LLE Required Braces or Orthoses: Other Brace/Splint Other Brace/Splint: knee brace L locked in extension, no active knee extension but full ROM at hip Restrictions Weight Bearing Restrictions: Yes RLE Weight Bearing: Weight bearing as tolerated LLE Weight Bearing: Non weight bearing Pain: Pain Assessment Pain Assessment: 0-10 Pain Score:   7 Pain Type: Acute pain Pain Location: Leg Pain Descriptors: Aching;Discomfort Pain  Frequency: Occasional Pain Onset: Gradual Patients Stated Pain Goal: 2 Pain Intervention(s): Medication (See eMAR) (oxycodone 15mg  po)  See FIM for current functional status  Therapy/Group: Individual Therapy  Roney Mans [facility] 05/11/2012, 3:53 PM

## 2012-05-11 NOTE — Progress Notes (Signed)
Subjective/Complaints: Pain better. Working with OT this am already. A 12 point review of systems has been performed and if not noted above is otherwise negative.   Objective: Vital Signs: Blood pressure 108/61, pulse 80, temperature 98.3 F (36.8 C), temperature source Oral, resp. rate 20, weight 119.8 kg (264 lb 1.8 oz), SpO2 96.00%. No results found. No results found for this basename: WBC:2,HGB:2,HCT:2,PLT:2 in the last 72 hours No results found for this basename: NA:2,K:2,CL:2,CO2:2,GLUCOSE:2,BUN:2,CREATININE:2,CALCIUM:2 in the last 72 hours CBG (last 3)   Basename 05/10/12 2056 05/10/12 1646 05/10/12 1158  GLUCAP 108* 111* 97    Wt Readings from Last 3 Encounters:  05/10/12 119.8 kg (264 lb 1.8 oz)  04/22/12 134.5 kg (296 lb 8.3 oz)  04/22/12 134.5 kg (296 lb 8.3 oz)    Physical Exam:   General: Alert and oriented x 3, No apparent distress.obese HEENT: Head is normocephalic, atraumatic, PERRLA, EOMI, sclera anicteric, oral mucosa pink and moist, dentition intact, ext ear canals clear,  Neck: Supple without JVD or lymphadenopathy Heart: Reg rate and rhythm. No murmurs rubs or gallops Chest: CTA bilaterally without wheezes, rales, or rhonchi; no distress Abdomen: Soft, non-tender, non-distended, bowel sounds positive. Extremities: No clubbing, cyanosis, or edema. Pulses are 2+.  Knee incision CDI sutures out Skin:  A few reamaining sutures in the groin/inguinal areas Neuro: alert, poor insight and awareness. . Able to follow simple one step commands. Cranial nerves 2-12 are intact. Sensory exam is normal except for left leg. Reflexes are 1 to 2+ in all 4's. Fine motor coordination is intact. No tremors. Motor function is grossly 4 in the UE. RLE is 2 prox to 4 distally. LLE not tested proximally, he 1-2/5 strength in the foot. Foot remains tender to palpation Brady Musculoskeletal: left leg in KI. Wrapped. Psych: . Pt is cooperative      Assessment/Plan: 1. 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 Physiatrist is providing close team supervision and 24 hour management of active medical problems listed below. Physiatrist and rehab team continue to assess barriers to discharge/monitor patient progress toward functional and medical goals. FIM: FIM - Bathing Bathing Steps Patient Completed: Chest;Right Arm;Left Arm;Abdomen;Front perineal area;Buttocks;Right upper leg;Left upper leg;Right lower leg (including foot) Bathing: 4: Min-Patient completes 8-9 33f 10 parts or 75+ percent  FIM - Upper Body Dressing/Undressing Upper body dressing/undressing steps patient completed: Thread/unthread right sleeve of pullover shirt/dresss;Thread/unthread left sleeve of pullover shirt/dress;Put head through opening of pull over shirt/dress;Pull shirt over trunk Upper body dressing/undressing: 7: Complete Independence: No helper FIM - Lower Body Dressing/Undressing Lower body dressing/undressing steps patient completed: Thread/unthread right pants leg;Pull pants up/down Lower body dressing/undressing: 3: Mod-Patient completed 50-74% of tasks  FIM - Toileting Toileting: 0: Activity did not occur  FIM - Diplomatic Services operational officer Devices: Human resources officer Transfers: 4-To toilet/BSC: Min A (steadying Pt. > 75%);4-From toilet/BSC: Min A (steadying Pt. > 75%)  FIM - Banker Devices: Sliding board Bed/Chair Transfer: 4: Bed > Chair or W/C: Min A (steadying Pt. > 75%);4: Chair or W/C > Bed: Min A (steadying Pt. > 75%)  FIM - Locomotion: Wheelchair Distance: 2 x 100' Locomotion: Wheelchair: 2: Travels 50 - 149 ft with supervision, cueing or coaxing FIM - Locomotion: Ambulation Ambulation/Gait Assistance: Not tested (comment) Locomotion: Ambulation: 0: Activity did not  occur  Comprehension Comprehension Mode: Auditory Comprehension: 5-Understands basic 90% of  the time/requires cueing < 10% of the time  Expression Expression Mode: Verbal Expression: 5-Expresses complex 90% of the time/cues < 10% of the time  Social Interaction Social Interaction: 5-Interacts appropriately 90% of the time - Needs monitoring or encouragement for participation or interaction.  Problem Solving Problem Solving Mode: Not assessed Problem Solving: 4-Solves basic 75 - 89% of the time/requires cueing 10 - 24% of the time  Memory Memory: 4-Recognizes or recalls 75 - 89% of the time/requires cueing 10 - 24% of the time Medical Problem List and Plan:  1. DVT Prophylaxis/Anticoagulation: Pharmaceutical: Lovenox  2. Pain Management: tylenol and oxycodone. Monitor for pain control with increased activity  -neurontin for neuro pain- increased to 600 tid  -fentanyl  3. Mood: seroquel at decresed dose and klonopin for mood stabilization and To assist with sleep. . Trazodone prn as well .   4 Neuropsych: This patient is not capable of making decisions on his/her own behalf.  5. Serratia bronchitis: resolved 6. Leucocytosis: see above. Follow clinically, --no  Active signs of infection. 7. DM type 2: glucophage and sliding scale insulin with reasonable control  8. ABLA: follow serial cbc's. Multivitamin with iron.  9. 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 Is NWB LLE and WBAT RLE for transfer only 10.  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  HTN: improved with increased metoprolol LOS (Days) 19 A FACE TO FACE EVALUATION WAS PERFORMED  Miguel Brady 05/11/2012,  7:17 AM

## 2012-05-12 ENCOUNTER — Inpatient Hospital Stay (HOSPITAL_COMMUNITY): Payer: Medicaid Other

## 2012-05-12 ENCOUNTER — Inpatient Hospital Stay (HOSPITAL_COMMUNITY): Payer: Medicaid Other | Admitting: Physical Therapy

## 2012-05-12 ENCOUNTER — Inpatient Hospital Stay (HOSPITAL_COMMUNITY): Payer: Medicaid Other | Admitting: Speech Pathology

## 2012-05-12 ENCOUNTER — Inpatient Hospital Stay (HOSPITAL_COMMUNITY): Payer: Medicaid Other | Admitting: *Deleted

## 2012-05-12 LAB — GLUCOSE, CAPILLARY: Glucose-Capillary: 101 mg/dL — ABNORMAL HIGH (ref 70–99)

## 2012-05-12 NOTE — Progress Notes (Signed)
Subjective/Complaints: No new complaints today! A 12 point review of systems has been performed and if not noted above is otherwise negative.   Objective: Vital Signs: Blood pressure 112/58, pulse 84, temperature 98.1 F (36.7 C), temperature source Oral, resp. rate 19, weight 121.2 kg (267 lb 3.2 oz), SpO2 95.00%. No results found. No results found for this basename: WBC:2,HGB:2,HCT:2,PLT:2 in the last 72 hours No results found for this basename: NA:2,K:2,CL:2,CO2:2,GLUCOSE:2,BUN:2,CREATININE:2,CALCIUM:2 in the last 72 hours CBG (last 3)   Basename 05/11/12 2039 05/11/12 1637 05/11/12 1156  GLUCAP 103* 131* 130*    Wt Readings from Last 3 Encounters:  05/12/12 121.2 kg (267 lb 3.2 oz)  04/22/12 134.5 kg (296 lb 8.3 oz)  04/22/12 134.5 kg (296 lb 8.3 oz)    Physical Exam:   General: Alert and oriented x 3, No apparent distress.obese HEENT: Head is normocephalic, atraumatic, PERRLA, EOMI, sclera anicteric, oral mucosa pink and moist, dentition intact, ext ear canals clear,  Neck: Supple without JVD or lymphadenopathy Heart: Reg rate and rhythm. No murmurs rubs or gallops Chest: CTA bilaterally without wheezes, rales, or rhonchi; no distress Abdomen: Soft, non-tender, non-distended, bowel sounds positive. Extremities: No clubbing, cyanosis, or edema. Pulses are 2+.  Knee incision CDI sutures out Skin:  A few reamaining sutures in the groin/inguinal areas Neuro: alert, poor insight and awareness. . Able to follow simple one step commands. Cranial nerves 2-12 are intact. Sensory exam is normal except for left leg. Reflexes are 1 to 2+ in all 4's. Fine motor coordination is intact. No tremors. Motor function is grossly 4 in the UE. RLE is 2 prox to 4 distally. LLE not tested proximally, he 1-2/5 strength in the foot. Foot remains tender to palpation t Musculoskeletal: left leg in KI. Wrapped. Psych: . Pt is cooperative      Assessment/Plan: 1. Functional deficits secondary to TBI  and polytrauma as below which require 3+ hours per day of interdisciplinary therapy in a comprehensive inpatient rehab setting. Physiatrist is providing close team supervision and 24 hour management of active medical problems listed below. Physiatrist and rehab team continue to assess barriers to discharge/monitor patient progress toward functional and medical goals. FIM: FIM - Bathing Bathing Steps Patient Completed: Chest;Right Arm;Left Arm;Abdomen;Front perineal area;Right upper leg;Left upper leg;Right lower leg (including foot) Bathing: 4: Min-Patient completes 8-9 78f 10 parts or 75+ percent  FIM - Upper Body Dressing/Undressing Upper body dressing/undressing steps patient completed: Thread/unthread right sleeve of pullover shirt/dresss;Thread/unthread left sleeve of pullover shirt/dress;Put head through opening of pull over shirt/dress;Pull shirt over trunk Upper body dressing/undressing: 5: Supervision: Safety issues/verbal cues FIM - Lower Body Dressing/Undressing Lower body dressing/undressing steps patient completed: Thread/unthread right pants leg Lower body dressing/undressing: 2: Max-Patient completed 25-49% of tasks  FIM - Toileting Toileting: 0: Activity did not occur  FIM - Diplomatic Services operational officer Devices: Human resources officer Transfers: 4-To toilet/BSC: Min A (steadying Pt. > 75%);4-From toilet/BSC: Min A (steadying Pt. > 75%)  FIM - Banker Devices: Sliding board Bed/Chair Transfer: 4: Supine > Sit: Min A (steadying Pt. > 75%/lift 1 leg);4: Sit > Supine: Min A (steadying pt. > 75%/lift 1 leg);5: Bed > Chair or W/C: Supervision (verbal cues/safety issues);4: Chair or W/C > Bed: Min A (steadying Pt. > 75%)  FIM - Locomotion: Wheelchair Distance: 100 Locomotion: Wheelchair: 2: Travels 50 - 149 ft with supervision, cueing or coaxing FIM - Locomotion: Ambulation Ambulation/Gait Assistance: Not  tested (comment) Locomotion: Ambulation: 0: Activity did  not occur  Comprehension Comprehension Mode: Auditory Comprehension: 5-Understands complex 90% of the time/Cues < 10% of the time  Expression Expression Mode: Verbal Expression: 5-Expresses complex 90% of the time/cues < 10% of the time  Social Interaction Social Interaction: 5-Interacts appropriately 90% of the time - Needs monitoring or encouragement for participation or interaction.  Problem Solving Problem Solving Mode: Not assessed Problem Solving: 5-Solves basic 90% of the time/requires cueing < 10% of the time  Memory Memory: 5-Recognizes or recalls 90% of the time/requires cueing < 10% of the time Medical Problem List and Plan:  1. DVT Prophylaxis/Anticoagulation: Pharmaceutical: Lovenox  2. Pain Management: tylenol and oxycodone on board--overall pain seems more tolerable  -neurontin for neuro pain- increased to 600 tid  -fentanyl  3. Mood: seroquel at decresed dose and klonopin for mood stabilization and To assist with sleep. . Trazodone prn as well .   4 Neuropsych: This patient is not capable of making decisions on his/her own behalf.  5. Serratia bronchitis: resolved 6. Leukocytosis: see above. Follow clinically, --no  Active signs of infection. 7. DM type 2: glucophage and sliding scale insulin with reasonable control  8. ABLA: follow serial cbc's. Multivitamin with iron.  9. 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 Is NWB LLE and WBAT RLE for transfer only 10.  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  HTN: improved with increased metoprolol LOS (Days) 20 A FACE TO FACE EVALUATION WAS  PERFORMED  Gailene Youkhana T 05/12/2012, 7:27 AM

## 2012-05-12 NOTE — Progress Notes (Signed)
Speech Language Pathology Daily Session Note  Patient Details  Name: Miguel Brady MRN: [id number] Date of Birth: 19-Dec-1960  Today's Date: 05/12/2012 Time: 1100-1200 Time Calculation (min): 60 min  Short Term Goals: Week 3: SLP Short Term Goal 1 (Week 3): Pt will demonstrate recall of new, daily information with supervision verbal and visual cues.  SLP Short Term Goal 2 (Week 3): Pt will demonstrate selective attention for ~30 minutes for Min A verbal cues for redirection. SLP Short Term Goal 3 (Week 3): Pt will demonstrate functional problem solving for basic and familiar tasks with supervision verbal cues SLP Short Term Goal 4 (Week 3): Pt will maintain a topic during functional conversation for ~4 turns with Min A verbal and question cues for redirection   Skilled Therapeutic Interventions: Treatment focus on complex problem solving and organization with new learning task.  Pt completed task with supervision question and verbal cues to complete task and required extra time and supervision verbal cues for redirection to task due to pain and repositioning himself in his chair. Pt transferred from wheelchair to bed with sliding board with supervision.    FIM:  Comprehension Comprehension Mode: Auditory Comprehension: 6-Follows complex conversation/direction: With extra time/assistive device Expression Expression Mode: Verbal Expression: 5-Expresses complex 90% of the time/cues < 10% of the time Social Interaction Social Interaction: 5-Interacts appropriately 90% of the time - Needs monitoring or encouragement for participation or interaction. Problem Solving Problem Solving: 5-Solves basic 90% of the time/requires cueing < 10% of the time Memory Memory: 5-Recognizes or recalls 90% of the time/requires cueing < 10% of the time FIM - Eating Eating Activity: 6: Swallowing techniques: self-managed  Pain Intermittently throughout session, Pt repositioned and pre-medicated    Therapy/Group: Individual Therapy  Miguel Brady 05/12/2012, 2:05 PM

## 2012-05-12 NOTE — Progress Notes (Signed)
Physical Therapy Note  Patient Details  Name: Miguel Brady MRN: [id number] Date of Birth: 22-Oct-1960 Today's Date: 05/12/2012  Time: 800-855 55 minutes  Pt c/o L ankle pain, RN aware.  Sliding board transfers with supervision to even surfaces, min A for uphill transfers, cues for set up.  W/c mobility with focus on obstacle negotiation, propulsion on various surfaces with supervision, increased fatigue with carpeted surfaces requiring more frequent rests.  Seated unsupported x 20 minutes with core and UE strengthening with 3# med ball.  Pt fatigues easily with sitting unsupported, required 2 x reclining rest breaks.  Educated pt on need to increase core strength to improve overall mobility.  Individual therapy   Shuntay Everetts 05/12/2012, 8:56 AM

## 2012-05-12 NOTE — Progress Notes (Addendum)
Physical Therapy Session Note  Patient Details  Name: Miguel Brady MRN: [id number] Date of Birth: 06/04/1961  Today's Date: 05/12/2012 Time: 14:00-15:00 ( )   Skilled Therapeutic Interventions/Progress Updates:  Tx focused on gentle PROM, bed mobility, and transfers.  Pt received in bed having mostly undressed lower body by himself, cleaning small urinal spill. Pt able to scoot to head of bed with bed rails and RLE in hooklying with S only and cues for efficiency.  Removed brace for gentle LLE PROM.   Performed passive ankle DF/PF x10, then x5 with 60 sec holds for DF stretch. Also performed passive knee flexion 2x10 to tolerable range, progressively higher, approximately to 20 degrees, with rest breaks between sets. Placed pillow beneath knee for prolonged passive x29min.   Pt able to don pants with Min A for threading LLE only. Pt rolling R/Lx4 for pulling up pants with increased time required.  Supine>sit with S only and bed rail with increased time required.  Pt sat unsupported edge of bed x10 min with no breaks for ball toss in all directions outside BOS.  Sit>supine with Min A for LLE lifting.       Therapy Documentation Precautions:  Precautions Precautions: Fall Precaution Comments: WBAT on RLE for transfers only, no active ext on LLE Required Braces or Orthoses: Other Brace/Splint Other Brace/Splint: knee brace L locked in extension, no active knee extension but full ROM at hip Restrictions Weight Bearing Restrictions: Yes RLE Weight Bearing: Weight bearing as tolerated LLE Weight Bearing: Non weight bearing Pain: 3/10 during PROM ex, repositioned prn   See FIM for current functional status  Therapy/Group: Individual Therapy  Virl Cagey, PT 05/12/2012, 2:23 PM

## 2012-05-12 NOTE — Progress Notes (Signed)
Social Work Patient ID: Miguel Brady, male   DOB: [birth year], 51 y.o.   MRN: [id number]  Met with patient and mother following team conference to review progress reports.  Also to inform them that I received a SNF bed offer today from [facility] who could accept pt tomorrow.  Pt and mother are accepting this bed and will plan for transfer in a.m.  Pt has made good progress on CIR.  Ellamay Fors

## 2012-05-12 NOTE — Progress Notes (Signed)
Occupational Therapy Session Note  Patient Details  Name: Miguel Brady MRN: [id number] Date of Birth: [birth year]  Today's Date: 05/12/2012 Time: 0700-0800 Time Calculation (min): 60 min  Short Term Goals: Week 3:  OT Short Term Goal 1 (Week 3): Pt will incorporate reacher to assist with threading pants over legs. OT Short Term Goal 2 (Week 3): Pt will perform slide board transfer to/from uneven surface with min A and +2 to hold s/c OT Short Term Goal 3 (Week 3): Pt will pull pants up seated EOB with mod A incorporating lateral leans OT Short Term Goal 4 (Week 3): Pt will direct doffing/donning of LLE brace with supervision  Skilled Therapeutic Interventions/Progress Updates:    Pt in bed asleep but easily aroused to participate in bathing and dressing tasks at bed level and seated EOB.  Pt performed rolling side to side using bed rails with supervision to position appropriately to bathe buttocks.  Pt assisted with doffing and donning LLE brace to allow for cleaning.  Pt performed supine>sit EOB using bed rails and assistance positioning LLE off EOB.  Pt completed all UB bathing and dressing tasks seated EOB with supervision/setup.  Focus on bed mobility, directing care/assistance, and activity tolerance.  Therapy Documentation Precautions:  Precautions Precautions: Fall Precaution Comments: WBAT on RLE for transfers only, no active ext on LLE Required Braces or Orthoses: Other Brace/Splint Other Brace/Splint: knee brace L locked in extension, no active knee extension but full ROM at hip Restrictions Weight Bearing Restrictions: Yes RLE Weight Bearing: Weight bearing as tolerated LLE Weight Bearing: Non weight bearing  Pain: Pain Assessment Pain Assessment: 0-10 Pain Score:   4 Pain Type: Acute pain Pain Location: Leg Pain Orientation: Left Pain Descriptors: Aching Pain Onset: On-going Patients Stated Pain Goal: 2 Pain Intervention(s): Repositioned  See FIM for current  functional status  Therapy/Group: Individual Therapy  Rich Brave 05/12/2012, 9:26 AM
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
## 2012-05-13 ENCOUNTER — Inpatient Hospital Stay (HOSPITAL_COMMUNITY): Payer: Self-pay | Admitting: Physical Therapy

## 2012-05-13 ENCOUNTER — Encounter (HOSPITAL_COMMUNITY): Payer: Self-pay

## 2012-05-13 ENCOUNTER — Inpatient Hospital Stay (HOSPITAL_COMMUNITY): Payer: Self-pay | Admitting: Occupational Therapy

## 2012-05-13 ENCOUNTER — Inpatient Hospital Stay (HOSPITAL_COMMUNITY): Payer: Self-pay | Admitting: Speech Pathology

## 2012-05-13 LAB — CREATININE, SERUM
Creatinine, Ser: 0.76 mg/dL (ref 0.50–1.35)
GFR calc Af Amer: >90 mL/min (ref >90)
GFR calc non Af Amer: >90 mL/min (ref >90)

## 2012-05-13 LAB — GLUCOSE, CAPILLARY

## 2012-05-13 NOTE — Progress Notes (Signed)
Occupational Therapy Session Note  Patient Details  Name: Miguel Brady MRN: [id number] Date of Birth: 06/20/1961  Today's Date: 05/13/2012 Time: 0900-1000 Time Calculation (min): 60 min  Short Term Goals: Week 3:  OT Short Term Goal 1 (Week 3): Pt will incorporate reacher to assist with threading pants over legs. OT Short Term Goal 2 (Week 3): Pt will perform slide board transfer to/from uneven surface with min A and +2 to hold s/c OT Short Term Goal 3 (Week 3): Pt will pull pants up seated EOB with mod A incorporating lateral leans OT Short Term Goal 4 (Week 3): Pt will direct doffing/donning of LLE brace with supervision  Skilled Therapeutic Interventions/Progress Updates:    Pt engaged in bathing and dressing tasks at bed level and seated EOB.  Pt uses bed rails to roll side to side to bathe buttocks and pull up pants.  Pt exhibited independence with directing care and with doffing/donning LLE brace.  Pt transferred to drop-arm [facility] and back to bed. Pt became "teary eyed" when talking about discharging to SNF later in the morning.  Emotional support offered.  Discussed with patient transition to SNF and importance for first impressions and communication styles.  Focus on bed mobility, activity tolerance, safety awareness, attention to task, sequencing, and transfers.  Therapy Documentation Precautions:  Precautions Precautions: Fall Precaution Comments: WBAT on RLE for transfers only, no active ext on LLE Required Braces or Orthoses: Other Brace/Splint Other Brace/Splint: knee brace L locked in extension, no active knee extension but full ROM at hip Restrictions Weight Bearing Restrictions: Yes RLE Weight Bearing: Weight bearing as tolerated LLE Weight Bearing: Non weight bearing  See FIM for current functional status  Therapy/Group: Individual Therapy  Rich Brave 05/13/2012, 10:07 AM

## 2012-05-13 NOTE — Progress Notes (Signed)
Nutrition Follow-up  Intervention:   1. Encourage Ensure Complete PO BID 2. Offer small frequent snacks from unit between meals to encourage intake 3. RD to continue to follow nutrition care plan  Assessment:   Pt planning to d/c today. Discussed intake and need for continued adequate nutrition when d/c. Intake has mostly been 90 - 100% of meals; except breakfast - pt does not like.  Diet Order:  Regular Supplements: Ensure Complete PO BID   Meds: Scheduled Meds:    . cholestyramine  4 g Oral Q2000  . clonazePAM  1 mg Per Tube QHS  . enoxaparin (LOVENOX) injection  40 mg Subcutaneous Q24H  . fentaNYL  25 mcg Transdermal Q72H  . gabapentin  600 mg Oral TID  . insulin aspart  0-5 Units Subcutaneous QHS  . insulin aspart  0-9 Units Subcutaneous TID WC  . lisinopril  5 mg Oral BID  . metFORMIN  500 mg Oral BID WC  . metoprolol tartrate  50 mg Oral BID  . pantoprazole  40 mg Oral Q1200  . QUEtiapine  25 mg Oral BID  . saccharomyces boulardii  500 mg Oral BID  . DISCONTD: antiseptic oral rinse  15 mL Mouth Rinse QID  . DISCONTD: dextromethorphan-guaiFENesin  1 tablet Oral BID  . DISCONTD: feeding supplement  237 mL Oral BID BM   Continuous Infusions:    . Gerhardt's butt cream     PRN Meds:.acetaminophen, albuterol, alum & mag hydroxide-simeth, bisacodyl, cloNIDine, diphenhydrAMINE, Gerhardt's butt cream, guaiFENesin-dextromethorphan, methocarbamol, naloxone (NARCAN) injection, ondansetron (ZOFRAN) IV, ondansetron, oxyCODONE, polyethylene glycol, prochlorperazine, prochlorperazine, prochlorperazine, sodium chloride, traMADol, traZODone  Labs:  CMP     Component Value Date/Time   NA 137 04/27/2012 1755   K 3.8 04/27/2012 1755   CL 103 04/27/2012 1755   CO2 24 04/27/2012 1755   GLUCOSE 137* 04/27/2012 1755   BUN 21 04/27/2012 1755   CREATININE 0.76 05/13/2012 0522   CALCIUM 8.3* 04/27/2012 1755   PROT 6.9 04/23/2012 0605   ALBUMIN 2.5* 04/23/2012 0605   AST 17 04/23/2012 0605   ALT 21  04/23/2012 0605   ALKPHOS 332* 04/23/2012 0605   BILITOT 1.7* 04/23/2012 0605   GFRNONAA >90 05/13/2012 0522   GFRAA >90 05/13/2012 0522   CBG (last 3)   Basename 05/13/12 0731 05/12/12 2058 05/12/12 1622  GLUCAP 104* 101* 97      Intake/Output Summary (Last 24 hours) at 05/13/12 1100 Last data filed at 05/13/12 0800  Gross per 24 hour  Intake    600 ml  Output    250 ml  Net    350 ml  BM 8/27  Weight Status:  120.1 kg - wt continues to trend down  Estimated needs:  2400 - 2600 kcal, 135 - 150 grams protein  Nutrition Dx:  Increased nutrient needs r/t trauma, healing and recovery AEB estimated needs. Ongoing.  Goal: Pt to meet >/= 90% of their estimated nutrition needs;  met  Monitor:  Weights, labs, PO intake, I/O's  Miguel Motto MS, RD, LDN Pager: [phone] After-hours pager: [phone]

## 2012-05-13 NOTE — Progress Notes (Signed)
Occupational Therapy Discharge Summary  Patient Details  Name: Miguel Brady MRN: [id number] Date of Birth: 06-27-1961  Today's Date: 05/13/2012  Patient has met 10 of 10 long term goals due to improved activity tolerance, improved balance, postural control, ability to compensate for deficits, functional use of  RIGHT upper, RIGHT lower and LEFT upper extremity, improved attention, improved awareness and improved coordination.  Pt has made gains in bathing, dressing, and toilet transfers this admission.  Pt exhibits independence with directing care and requests assistance appropriately.  Pt is supervision for UB bathing and dressing, min A for LB bathing and dressing at bed level, min A for toilet transfers using transfer board to drop arm BSC, and mod A for toileting with lateral leans. Pt is exhibiting behaviors consistent with Rancho Level VII.  Pt exhibits alternating attention in min distractive environment while performing functional tasks and self-corrects when wandering off-task or off-subject. Patient to discharge at [facility] Assist level.  Pt's family not able to care for patient at this time. Education is completed at this level. Pt will d/c to skilled facility to continue to focus on strengthening and functional mobility with ADL tasks  Recommendation:  Patient will benefit from ongoing skilled OT services in skilled nursing facility setting to continue to advance functional skills in the area of BADL and Reduce care partner burden.  Equipment: No equipment provided discharge to SNF  Reasons for discharge: treatment goals met and discharge from hospital  Patient/family agrees with progress made and goals achieved: Yes  OT Discharge   Pain Pain Assessment Pain Assessment: 0-10 Pain Score:   4 Pain Type: Acute pain Pain Orientation: Left Pain Descriptors: Aching;Throbbing Pain Onset: On-going Patients Stated Pain Goal: 2 Pain Intervention(s):  Repositioned ADL ADL Equipment Provided: Reacher Eating: Independent Where Assessed-Eating: Chair Grooming: Independent Where Assessed-Grooming: Edge of bed Upper Body Bathing: Supervision/safety Where Assessed-Upper Body Bathing: Edge of bed Lower Body Bathing: Minimal assistance Where Assessed-Lower Body Bathing: Bed level Upper Body Dressing: Supervision/safety Where Assessed-Upper Body Dressing: Edge of bed Lower Body Dressing: Minimal assistance Where Assessed-Lower Body Dressing: Bed level Toileting: Moderate assistance Where Assessed-Toileting: Bedside Commode Toilet Transfer: Minimal assistance Toilet Transfer Method: Scientist, research (life sciences): Extra wide bedside commode Vision/Perception  Vision - History Baseline Vision: Wears glasses all the time Patient Visual Report: No change from baseline Perception Perception: Within Functional Limits Praxis Praxis: Intact  Cognition Overall Cognitive Status: Impaired Arousal/Alertness: Awake/alert Orientation Level: Oriented X4 Attention: Sustained;Selective Focused Attention: Appears intact Sustained Attention: Appears intact Sustained Attention Impairment: Verbal basic;Functional basic Selective Attention: Impaired Selective Attention Impairment: Verbal basic;Functional basic Memory: Impaired Memory Impairment: Decreased short term memory Awareness: Impaired Awareness Impairment: Emergent impairment Problem Solving: Impaired Problem Solving Impairment: Verbal basic;Functional basic Sensation Sensation Light Touch: Appears Intact Proprioception: Appears Intact Coordination Gross Motor Movements are Fluid and Coordinated: Yes Fine Motor Movements are Fluid and Coordinated: Yes    Trunk/Postural Assessment  Cervical Assessment Cervical Assessment: Within Functional Limits Thoracic Assessment Thoracic Assessment: Within Functional Limits  Balance Static Sitting Balance Static Sitting - Balance  Support: Feet supported Static Sitting - Level of Assistance: 7: Independent Extremity/Trunk Assessment RUE Assessment RUE Assessment: Within Functional Limits LUE Assessment LUE Assessment: Within Functional Limits  See FIM for current functional status  Rich Brave 05/13/2012, 10:50 AM

## 2012-05-13 NOTE — Progress Notes (Signed)
Speech Language Pathology Session Note & Discharge Summary  Patient Details  Name: Miguel Brady MRN: [id number] Date of Birth: 05-Jan-1961  Today's Date: 05/13/2012 Time: 1000-1045 Time Calculation (min): 45 min  Skilled Therapeutic Intervention: Treatment focus on organization and anticipatory awareness. Pt required Min-Mod A verbal and question cues to demonstrate anticipatory awareness for discharge today to SNF. Pt also required Min verbal cues for organization of personal items to increase recall of location once at new facility. Pt's mother present throughout session and asked appropriate questions in regards to procedures for discharge, etc. All questions answered both pt and mother report no further questions at this time.   Patient has met 5 of 5 long term goals.  Patient to discharge at overall Min;Supervision level.   Reasons goals not met: N/A   Clinical Impression/Discharge Summary: Pt has made functional gains and has met all LTG's this admission. Currently, Pt is demonstrating behaviors consistent with a Rancho Level VII and is overall Min A for functional problem solving, selective attention, emergent awareness, working memory and Engineer, building services. Pt is also tolerating a regular diet with thin liquids and is overall Mod I for utilization of swallowing compensatory strategies. Pt education complete and pt will discharge to SNF to maximize cognitive function and overall independence and to reduce caregiver burden.    Care Partner:  Caregiver Able to Provide Assistance: No     Recommendation:  Skilled Nursing facility  Rationale for SLP Follow Up: Maximize cognitive function and independence;Reduce caregiver burden   Equipment: N/A   Reasons for discharge: Treatment goals met;Discharged from hospital   Patient/Family Agrees with Progress Made and Goals Achieved: Yes   See FIM for current functional status  [name] 05/13/2012, 11:00 AM

## 2012-05-13 NOTE — Progress Notes (Signed)
Discharge Note  The overall goal for the admission was met for:   Discharge location: Yes - plan was SNF upon admit  Length of Stay: Yes - 21 days  Discharge activity level: Yes  Home/community participation: Yes  Services provided included: MD, RD, PT, OT, SLP, RN, TR, Pharmacy, Neuropsych and SW  Financial Services: Medicaid -pending  Follow-up services arranged: Other: SNF at [facility]  Comments (or additional information):  Patient/Family verbalized understanding of follow-up arrangements: Yes  Individual responsible for coordination of the follow-up plan: patient and mother  Confirmed correct DME delivered: NA  Betsi Crespi

## 2012-05-13 NOTE — Progress Notes (Addendum)
Subjective/Complaints: Has had questions regarding weight bearing and use of left foot. A 12 point review of systems has been performed and if not noted above is otherwise negative.   Objective: Vital Signs: Blood pressure 129/69, pulse 87, temperature 98 F (36.7 C), temperature source Oral, resp. rate 19, weight 121.5 kg (267 lb 13.7 oz), SpO2 95.00%. Dg Hip 1 View Left  05/12/2012  *RADIOLOGY REPORT*  Clinical Data: Left hip fracture.  LEFT HIP - 1 VIEW:  Comparison: 04/07/2012  Findings: There are 3 screws across the left femoral neck fracture. There has been no change in the alignment or position in the AP projection.  Screws are seen across both sacroiliac joints.  There is a plate and multiple screws across the symphysis pubis.  IMPRESSION: No change in the appearance of the left hip.   Original Report Authenticated By: Gwynn Burly, M.D.    Dg Femur Left  05/12/2012  *RADIOLOGY REPORT*  Clinical Data: Left femur fracture.  LEFT FEMUR - 2 VIEW  Comparison: 04/07/2012  Findings: Since the prior study the fracture fragments in the mid right femur have separated further indicating there is a sagittal component to the fracture.  There is some callus formation.  The proximal and distal aspects of the femoral rod and fixation screws appear in good position.  IMPRESSION: Since the prior study there has been further separation of the fragments of the midshaft of the femur indicating that there is no intact circumferential bone  in the mid femur.   Original Report Authenticated By: Gwynn Burly, M.D.    Dg Tibia/fibula Left  05/12/2012  *RADIOLOGY REPORT*  Clinical Data: Tibia and fibula fractures.  LEFT TIBIA AND FIBULA - 2 VIEW  Comparison: 04/07/2012  Findings: Intramedullary rod is in good position in the tibia and fixation screws appear in good position, unchanged.  Alignment and position of the tibia fracture is near anatomic and unchanged. There is slight improvement in the alignment and  position of the multiple fractures of the fibular shaft.  IMPRESSION: No change in the mid tibia fracture.  Improved alignment and position of the fibular fractures.   Original Report Authenticated By: Gwynn Burly, M.D.    Dg Pelvis Comp Min 3v  05/12/2012  *RADIOLOGY REPORT*  Clinical Data: Multiple pelvic fractures.  Pain.  JUDET PELVIS - 3+ VIEW  Comparison: 08/14 and 04/07/2012  Findings: Alignment of the pelvic bones as anatomic.  There has been no change since the prior exam.  IMPRESSION: Anatomic alignment of the pelvic bones.  No change since the prior exams.   Original Report Authenticated By: Gwynn Burly, M.D.    No results found for this basename: WBC:2,HGB:2,HCT:2,PLT:2 in the last 72 hours No results found for this basename: NA:2,K:2,CL:2,CO2:2,GLUCOSE:2,BUN:2,CREATININE:2,CALCIUM:2 in the last 72 hours CBG (last 3)   Basename 05/12/12 2058 05/12/12 1622 05/12/12 1154  GLUCAP 101* 97 96    Wt Readings from Last 3 Encounters:  05/13/12 121.5 kg (267 lb 13.7 oz)  04/22/12 134.5 kg (296 lb 8.3 oz)  04/22/12 134.5 kg (296 lb 8.3 oz)    Physical Exam:   General: Alert and oriented x 3, No apparent distress.obese HEENT: Head is normocephalic, atraumatic, PERRLA, EOMI, sclera anicteric, oral mucosa pink and moist, dentition intact, ext ear canals clear,  Neck: Supple without JVD or lymphadenopathy Heart: Reg rate and rhythm. No murmurs rubs or gallops Chest: CTA bilaterally without wheezes, rales, or rhonchi; no distress Abdomen: Soft, non-tender, non-distended, bowel sounds positive. Extremities:  No clubbing, cyanosis, or edema. Pulses are 2+.  Knee incision CDI sutures out Skin:  A few reamaining sutures in the groin/inguinal areas Neuro: alert, poor insight and awareness. . Able to follow simple one step commands. Cranial nerves 2-12 are intact. Sensory exam is normal except for left leg. Reflexes are 1 to 2+ in all 4's. Fine motor coordination is intact. No tremors.  Motor function is grossly 4 in the UE. RLE is 2 prox to 4 distally. LLE not tested proximally, he 1-2/5 strength in the foot. Foot remains tender to palpation t Musculoskeletal: left leg in KI. Wrapped. Psych: . Pt is cooperative      Assessment/Plan: 1. 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 Physiatrist is providing close team supervision and 24 hour management of active medical problems listed below. Physiatrist and rehab team continue to assess barriers to discharge/monitor patient progress toward functional and medical goals.  It appears there is a SNF bed available today. I will see the patient back in my office in about 4 weeks FIM: FIM - Bathing Bathing Steps Patient Completed: Chest;Right Arm;Left Arm;Abdomen;Front perineal area;Buttocks;Right upper leg;Left upper leg Bathing: 4: Min-Patient completes 8-9 30f 10 parts or 75+ percent  FIM - Upper Body Dressing/Undressing Upper body dressing/undressing steps patient completed: Thread/unthread right sleeve of pullover shirt/dresss;Thread/unthread left sleeve of pullover shirt/dress;Put head through opening of pull over shirt/dress;Pull shirt over trunk Upper body dressing/undressing: 5: Supervision: Safety issues/verbal cues FIM - Lower Body Dressing/Undressing Lower body dressing/undressing steps patient completed: Thread/unthread right pants leg;Don/Doff right sock Lower body dressing/undressing: 2: Max-Patient completed 25-49% of tasks  FIM - Toileting Toileting: 0: Activity did not occur  FIM - Diplomatic Services operational officer Devices: Human resources officer Transfers: 4-To toilet/BSC: Min A (steadying Pt. > 75%);4-From toilet/BSC: Min A (steadying Pt. > 75%)  FIM - Bed/Chair Transfer Bed/Chair Transfer Assistive Devices: Bed rails Bed/Chair Transfer: 5: Supine > Sit:  Supervision (verbal cues/safety issues);4: Sit > Supine: Min A (steadying pt. > 75%/lift 1 leg)  FIM - Locomotion: Wheelchair Distance: 100 Locomotion: Wheelchair: 0: Activity did not occur FIM - Locomotion: Ambulation Ambulation/Gait Assistance: Not tested (comment) Locomotion: Ambulation: 0: Activity did not occur  Comprehension Comprehension Mode: Auditory Comprehension: 6-Follows complex conversation/direction: With extra time/assistive device  Expression Expression Mode: Verbal Expression: 5-Expresses complex 90% of the time/cues < 10% of the time  Social Interaction Social Interaction: 5-Interacts appropriately 90% of the time - Needs monitoring or encouragement for participation or interaction.  Problem Solving Problem Solving Mode: Not assessed Problem Solving: 5-Solves basic 90% of the time/requires cueing < 10% of the time  Memory Memory: 5-Recognizes or recalls 90% of the time/requires cueing < 10% of the time Medical Problem List and Plan:  1. DVT Prophylaxis/Anticoagulation: Pharmaceutical: Lovenox  2. Pain Management: tylenol and oxycodone on board--overall pain seems more tolerable  -neurontin for neuro pain- increased to 600 tid  -fentanyl  3. Mood: seroquel at decresed dose and klonopin for mood stabilization and To assist with sleep. . Trazodone prn as well .   4 Neuropsych: This patient is not capable of making decisions on his/her own behalf.  5. Serratia bronchitis: resolved 6. Leukocytosis: see above. Follow clinically, --no  Active signs of infection. 7. DM type 2: glucophage and sliding scale insulin with reasonable control  8. ABLA: follow serial cbc's. Multivitamin with iron.  9. Ortho:  -pelvic open  book fx and pubic symphysis diastasis with SI widening  -left femoral neck and proximal femur fx's  -left patella fx and infrapatellar tendon injury  -left distal Tibia and fibular fx's  -multiple rib fxs and skull fx's  - LLE: NO ACTIVE  EXTENSION AND HINGED BRACE NEEDS TO BE LOCKED IN FULL EXTENSION WHEN IN BED. Is NWB LLE and WBAT RLE for transfer only -xrays ordered by ortho who will assess and make recs on advancement before dc. i see minimal to little change in the films. 10.  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  HTN: improved with increased metoprolol LOS (Days) 21 A FACE TO FACE EVALUATION WAS PERFORMED  SWARTZ,ZACHARY T 05/13/2012, 7:20 AM

## 2012-05-13 NOTE — Progress Notes (Signed)
Pt d/c'd to SNF via EMS; mother at [facility]; VSS,   PRN pain med given early for d/c per PAC orders; Carlean Purl

## 2012-05-13 NOTE — Progress Notes (Signed)
Physical Therapy Discharge Summary  Patient Details  Name: Miguel Brady MRN: [id number] Date of Birth: 06/06/1961  Today's Date: 05/13/2012  Patient has met 7 of 7 long term goals due to improved activity tolerance, improved balance, improved postural control, increased strength, decreased pain, ability to compensate for deficits, improved attention and improved awareness.  Patient to discharge at a wheelchair level Min Assist.   Reasons goals not met: n/a  Recommendation:  Patient will benefit from ongoing skilled PT services in skilled nursing facility setting to continue to advance safe functional mobility, address ongoing impairments in strength, mobility, activity tolerance, and minimize fall risk.  Equipment: to be provided by next level of care  Reasons for discharge: treatment goals met and discharge from hospital  Patient/family agrees with progress made and goals achieved: Yes  PT Discharge Precautions/Restrictions Precautions Precautions: Fall Restrictions RLE Weight Bearing: Weight bearing as tolerated LLE Weight Bearing: Non weight bearing  Cognition Overall Cognitive Status: Impaired Arousal/Alertness: Awake/alert Orientation Level: Oriented X4 Attention: Sustained;Selective Focused Attention: Appears intact Sustained Attention: Appears intact Sustained Attention Impairment: Verbal basic;Functional basic Selective Attention: Impaired Selective Attention Impairment: Verbal basic;Functional basic Awareness: Impaired Awareness Impairment: Emergent impairment;Anticipatory impairment Behaviors: Poor frustration tolerance Safety/Judgment: Impaired Rancho Mirant Scales of Cognitive Functioning: Automatic/appropriate Sensation Sensation Light Touch: Appears Intact (hypersensitive L toes, foot) Proprioception: Appears Intact Coordination Gross Motor Movements are Fluid and Coordinated: Yes Fine Motor Movements are Fluid and Coordinated: Yes Motor    Motor Motor: Abnormal postural alignment and control Motor - Discharge Observations: trunk weakness   Trunk/Postural Assessment  Cervical Assessment Cervical Assessment: Within Functional Limits Thoracic Assessment Thoracic Assessment:  (kyphosis) Lumbar AROM Overall Lumbar AROM Comments: limited due to pain Postural Control Postural Control:  (posterior tilt)  Balance Static Sitting Balance Static Sitting - Balance Support: Feet supported Static Sitting - Level of Assistance: 7: Independent Extremity Assessment  RUE Assessment RUE Assessment: Within Functional Limits LUE Assessment LUE Assessment: Within Functional Limits RLE Assessment RLE Assessment: Within Functional Limits LLE Strength LLE Overall Strength Comments: limited by restrictions, brace locked in knee extension, hip 2+/5  See FIM for current functional status  Cledis Sohn 05/13/2012, 12:22 PM

## 2012-05-14 NOTE — ED Notes (Signed)
I saw and evaluated the patient, reviewed the resident's note and I agree with the findings and plan.   .Face to face Exam:  General:  Awake HEENT:  Atraumatic Resp:  Normal effort Abd:  Nondistended Neuro:No focal weakness Lymph: No adenopathy   Nelia Shi, MD 05/14/12 1705

## 2012-06-10 ENCOUNTER — Encounter: Payer: Self-pay | Admitting: Physical Medicine & Rehabilitation

## 2012-06-10 ENCOUNTER — Encounter: Payer: Medicaid Other | Attending: Physical Medicine & Rehabilitation | Admitting: Physical Medicine & Rehabilitation

## 2012-06-10 VITALS — BP 125/85 | HR 80 | Resp 14 | Ht 72.0 in | Wt 264.0 lb

## 2012-06-10 DIAGNOSIS — S82209A Unspecified fracture of shaft of unspecified tibia, initial encounter for closed fracture: Secondary | ICD-10-CM | POA: Insufficient documentation

## 2012-06-10 DIAGNOSIS — S2249XA Multiple fractures of ribs, unspecified side, initial encounter for closed fracture: Secondary | ICD-10-CM

## 2012-06-10 DIAGNOSIS — S069XAA Unspecified intracranial injury with loss of consciousness status unknown, initial encounter: Secondary | ICD-10-CM | POA: Insufficient documentation

## 2012-06-10 DIAGNOSIS — S32509A Unspecified fracture of unspecified pubis, initial encounter for closed fracture: Secondary | ICD-10-CM | POA: Insufficient documentation

## 2012-06-10 DIAGNOSIS — S7292XB Unspecified fracture of left femur, initial encounter for open fracture type I or II: Secondary | ICD-10-CM

## 2012-06-10 DIAGNOSIS — X58XXXA Exposure to other specified factors, initial encounter: Secondary | ICD-10-CM | POA: Insufficient documentation

## 2012-06-10 DIAGNOSIS — S069X9A Unspecified intracranial injury with loss of consciousness of unspecified duration, initial encounter: Secondary | ICD-10-CM

## 2012-06-10 DIAGNOSIS — S8410XA Injury of peroneal nerve at lower leg level, unspecified leg, initial encounter: Secondary | ICD-10-CM | POA: Insufficient documentation

## 2012-06-10 DIAGNOSIS — S82009A Unspecified fracture of unspecified patella, initial encounter for closed fracture: Secondary | ICD-10-CM

## 2012-06-10 DIAGNOSIS — S7290XB Unspecified fracture of unspecified femur, initial encounter for open fracture type I or II: Secondary | ICD-10-CM

## 2012-06-10 DIAGNOSIS — S329XXA Fracture of unspecified parts of lumbosacral spine and pelvis, initial encounter for closed fracture: Secondary | ICD-10-CM

## 2012-06-10 DIAGNOSIS — S020XXA Fracture of vault of skull, initial encounter for closed fracture: Secondary | ICD-10-CM

## 2012-06-10 DIAGNOSIS — S7290XA Unspecified fracture of unspecified femur, initial encounter for closed fracture: Secondary | ICD-10-CM | POA: Insufficient documentation

## 2012-06-10 NOTE — Patient Instructions (Signed)
1. CONTINUE WITH CURRENT PLAN TO INCREASE MOBILITY, SELF-CARE, COGNITION.  2. ORTHO TO COMMENT ON WEIGHT BEARING AND FURTHER SURGICAL NEEDS.  3. I WOULD CONTINUE HIS PAIN MEDICATIONS INCLUDING THE FENTANYL PATCH, OXYCODONE, AND NEURONTIN AT THEIR CURRENT DOSES UNTIL HIS ORTHOPEDIC PAIN BEGINS TO IMPROVE  4. CAN FOLLOW UP WITH ME IN 2 MONTHS.

## 2012-06-10 NOTE — Progress Notes (Signed)
Subjective:    Patient ID: Miguel Brady, male    DOB: 12-02-1960, 51 y.o.   MRN: [id number]  HPI  Miguel Brady is here in follow up of his TBI and polytrauma. He is currently over at [facility] and is working on strengthening and mobility exercises. He is able to accomplish std/pivot transfers. He is non weight bearing on the left leg. He sees dr. Carola Frost in follow up today. His pain in the foot is better, with only tingling noted over the anterior foot and leg. His worst pain complaint is the left knee which remains tender with rest and ROM.  He remains on fentanyl and oxycodone as well as neurontin for pain control.  He has occasional constipation but has been continent of  Bowel and bladder. Emotionally he has remained fairly up beat and has gotten along with staff at the facility.  Ultimately, he would like to return to his home in [place]. His mother can provide distant help, but nothing more, so he would ultimatley have to be independent to return there. There does appear to be a ramp into the house now.  Pain Inventory Average Pain 7 Pain Right Now 6 My pain is intermittent, tingling and aching  In the last 24 hours, has pain interfered with the following? General activity 10 Relation with others 0 Enjoyment of life 7 What TIME of day is your pain at its worst? varies Sleep (in general) Fair  Pain is worse with: some activites Pain improves with: medication Relief from Meds: 7  Mobility how many minutes can you walk? working with therapy but not walking yet ability to climb steps?  no do you drive?  no use a wheelchair needs help with transfers  Function disabled: date disabled  I need assistance with the following:  dressing, bathing, toileting, meal prep, household duties and shopping  Neuro/Psych trouble walking dizziness  Prior Studies Any changes since last visit?  no  Physicians involved in your care Any changes since last visit?  no   History reviewed. No  pertinent family history. History   Social History  . Marital Status: Single    Spouse Name: N/A    Number of Children: N/A  . Years of Education: N/A   Social History Main Topics  . Smoking status: Current Every Day Smoker -- 0.5 packs/day for 30 years    Types: Cigarettes    Start date: 04/02/2012  . Smokeless tobacco: Never Used  . Alcohol Use: No     OCCASIONAL  . Drug Use: No  . Sexually Active: None   Other Topics Concern  . None   Social History Narrative   ** Merged History Encounter **    Past Surgical History  Procedure Date  . External fixation pelvis 04/03/2012    Procedure: EXTERNAL FIXATION PELVIS;  Surgeon: Budd Palmer, MD;  Location: [facility] OR;  Service: Orthopedics;;  . External fixation leg 04/03/2012    Procedure: EXTERNAL FIXATION LEG;  Surgeon: Budd Palmer, MD;  Location: [facility] OR;  Service: Orthopedics;  Laterality: Left;  Left femur  . Chest tube insertion 04/03/2012    Procedure: CHEST TUBE INSERTION;  Surgeon: Liz Malady, MD;  Location: [facility] OR;  Service: General;  Laterality: Left;  . Incision and drainage of wound 04/03/2012    Procedure: IRRIGATION AND DEBRIDEMENT WOUND;  Surgeon: Clydene Fake, MD;  Location: [facility] OR;  Service: Neurosurgery;  Laterality: N/A;  Frontal.  . Tibia im nail insertion 04/07/2012  Procedure: INTRAMEDULLARY (IM) NAIL TIBIAL;  Surgeon: Budd Palmer, MD;  Location: [facility] OR;  Service: Orthopedics;  Laterality: Left;  . Femur im nail 04/07/2012    Procedure: INTRAMEDULLARY (IM) NAIL FEMORAL;  Surgeon: Budd Palmer, MD;  Location: [facility] OR;  Service: Orthopedics;  Laterality: Left;  . Orif patella 04/07/2012    Procedure: OPEN REDUCTION INTERNAL (ORIF) FIXATION PATELLA;  Surgeon: Budd Palmer, MD;  Location: [facility] OR;  Service: Orthopedics;  Laterality: Left;  . Orif pelvic fracture 04/07/2012    Procedure: OPEN REDUCTION INTERNAL FIXATION (ORIF) PELVIC FRACTURE;  Surgeon: Budd Palmer, MD;  Location: [facility] OR;  Service:  Orthopedics;  Laterality: N/A;  Right and left sacroiliac screw pinning,Irrigation and debridebridement open tibia and femur,removal external fixator.  . Flexible bronchoscopy 04/07/2012    Procedure: FLEXIBLE BRONCHOSCOPY;  Surgeon: Liz Malady, MD;  Location: [facility] OR;  Service: General;;  START TIME=1645 END TIME=1700   Past Medical History  Diagnosis Date  . Obesity   . Hypertension   . Diabetes mellitus   . Headache   . TOBACCO ABUSE 12/23/2008  . 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 GERD 05/05/2007  . TRIGGER FINGER 05/05/2007   BP 125/85  Pulse 80  Resp 14  Ht 6' (1.829 m)  Wt 264 lb (119.75 kg)  BMI 35.80 kg/m2  SpO2 95%    Review of Systems  Musculoskeletal: Positive for gait problem.       Leg pain  Neurological: Positive for dizziness.       Tingling  All other systems reviewed and are negative.       Objective:   Physical Exam  General: Alert and oriented x 3, No apparent distress HEENT: Head is normocephalic, atraumatic, PERRLA, EOMI, sclera anicteric, oral mucosa pink and moist, dentition intact, ext ear canals clear,  Neck: Supple without JVD or lymphadenopathy Heart: Reg rate and rhythm. No murmurs rubs or gallops Chest: CTA bilaterally without wheezes, rales, or rhonchi; no distress Abdomen: Soft, non-tender, non-distended, bowel sounds positive. Extremities: No clubbing, cyanosis, or edema. Pulses are 2+ Skin: he has multiple scars over the left leg. There is mild breakdown over the patella. He also has a heel ulcer which is dressed. Neuro: Pt is alert. Oriented to date, place, reason. Conversationally he's appropriate. He's occasionally a little impulsive and loses focus during conversation. He has mild STM deficits. UE strength grossly 5/5. RLE is grossly 4//5. LLE is 1-2 proximally to distal. He has limited left KE/KF due to pain. He has decreased sensory to LT over the anterior/lateral leg and dorsum of the foot.   Musculoskeletal: Full ROM, No pain  with AROM or PROM in the neck, trunk, or extremities. Posture appropriate Psych: Pt's affect is appropriate. Pt is cooperative         Assessment & Plan:  Assessment:  1. Traumatic brain injury with polytrauma.  2. 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  3. Left femur fracture treated with intramedullary nailing.  4. Left tibia fracture treated with intramedullary nailing.  5. Left patella fracture and infrapatellar tendon injury, treated with open  reduction and internal fixation of patella. 6. Right peroneal nerve(or sciatic branch) injury  Plan: 1. Follow up with ortho regarding left patella, femur, tibia, etc. The amount of pain he's still having in his tibia is concerning. His orthopedic  and pain progression will ultimately be the rate limiting step in his mobility as he is doing well enough cognitively at this point that safety shouldn't be a major concern. 2. 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 He is showing signs of recovery already and I would remain conservative for now. Stay with current neurontin dosing. 3. Discussed plan of care, and he will likely need several more weeks at the SNF to reach a level where he can be intermittently independent. 4. Maintain fentanyl and oxycodone at current dosing 5. I will see the patient back in about 2 months.

## 2012-07-27 ENCOUNTER — Observation Stay (HOSPITAL_COMMUNITY)
Admission: EM | Admit: 2012-07-27 | Discharge: 2012-07-30 | Disposition: A | Discharge status: Home / Self Care | Payer: Medicaid Other | Attending: General Surgery | Admitting: General Surgery

## 2012-07-27 ENCOUNTER — Emergency Department (HOSPITAL_COMMUNITY): Payer: Medicaid Other

## 2012-07-27 ENCOUNTER — Encounter (HOSPITAL_COMMUNITY): Payer: Self-pay | Admitting: Emergency Medicine

## 2012-07-27 DIAGNOSIS — K812 Acute cholecystitis with chronic cholecystitis: Secondary | ICD-10-CM | POA: Diagnosis present

## 2012-07-27 DIAGNOSIS — Z87820 Personal history of traumatic brain injury: Secondary | ICD-10-CM | POA: Insufficient documentation

## 2012-07-27 DIAGNOSIS — E669 Obesity, unspecified: Secondary | ICD-10-CM | POA: Insufficient documentation

## 2012-07-27 DIAGNOSIS — K8010 Calculus of gallbladder with chronic cholecystitis without obstruction: Secondary | ICD-10-CM | POA: Insufficient documentation

## 2012-07-27 DIAGNOSIS — K811 Chronic cholecystitis: Secondary | ICD-10-CM

## 2012-07-27 DIAGNOSIS — Z7982 Long term (current) use of aspirin: Secondary | ICD-10-CM | POA: Insufficient documentation

## 2012-07-27 DIAGNOSIS — I10 Essential (primary) hypertension: Secondary | ICD-10-CM | POA: Insufficient documentation

## 2012-07-27 DIAGNOSIS — K824 Cholesterolosis of gallbladder: Secondary | ICD-10-CM

## 2012-07-27 DIAGNOSIS — Z23 Encounter for immunization: Secondary | ICD-10-CM | POA: Insufficient documentation

## 2012-07-27 DIAGNOSIS — K819 Cholecystitis, unspecified: Secondary | ICD-10-CM

## 2012-07-27 DIAGNOSIS — S069X9A Unspecified intracranial injury with loss of consciousness of unspecified duration, initial encounter: Secondary | ICD-10-CM | POA: Diagnosis present

## 2012-07-27 DIAGNOSIS — K8000 Calculus of gallbladder with acute cholecystitis without obstruction: Principal | ICD-10-CM | POA: Insufficient documentation

## 2012-07-27 DIAGNOSIS — E119 Type 2 diabetes mellitus without complications: Secondary | ICD-10-CM | POA: Insufficient documentation

## 2012-07-27 DIAGNOSIS — Z79899 Other long term (current) drug therapy: Secondary | ICD-10-CM | POA: Insufficient documentation

## 2012-07-27 DIAGNOSIS — K219 Gastro-esophageal reflux disease without esophagitis: Secondary | ICD-10-CM | POA: Insufficient documentation

## 2012-07-27 DIAGNOSIS — F17200 Nicotine dependence, unspecified, uncomplicated: Secondary | ICD-10-CM | POA: Insufficient documentation

## 2012-07-27 LAB — CBC WITH DIFFERENTIAL/PLATELET
Basophils Absolute: 0 10*3/uL (ref 0.0–0.1)
Basophils Relative: 0 % (ref 0–1)
Eosinophils Absolute: 0 10*3/uL (ref 0.0–0.7)
Eosinophils Relative: 0 % (ref 0–5)
HCT: 38.5 % — ABNORMAL LOW (ref 39.0–52.0)
Hemoglobin: 12.8 g/dL — ABNORMAL LOW (ref 13.0–17.0)
Lymphocytes Relative: 28 % (ref 12–46)
Lymphs Abs: 3 10*3/uL (ref 0.7–4.0)
MCH: 28.1 pg (ref 26.0–34.0)
MCHC: 33.2 g/dL (ref 30.0–36.0)
MCV: 84.4 fL (ref 78.0–100.0)
Monocytes Absolute: 0.9 10*3/uL (ref 0.1–1.0)
Monocytes Relative: 8 % (ref 3–12)
Neutro Abs: 7 10*3/uL (ref 1.7–7.7)
Neutrophils Relative %: 64 % (ref 43–77)
Platelets: 309 10*3/uL (ref 150–400)
RBC: 4.56 MIL/uL (ref 4.22–5.81)
RDW: 15.6 % — ABNORMAL HIGH (ref 11.5–15.5)
WBC: 11 10*3/uL — ABNORMAL HIGH (ref 4.0–10.5)

## 2012-07-27 LAB — COMPREHENSIVE METABOLIC PANEL
ALT: 7 U/L (ref 0–53)
AST: 14 U/L (ref 0–37)
Albumin: 4.3 g/dL (ref 3.5–5.2)
Alkaline Phosphatase: 150 U/L — ABNORMAL HIGH (ref 39–117)
BUN: 5 mg/dL — ABNORMAL LOW (ref 6–23)
CO2: 28 mEq/L (ref 19–32)
Calcium: 9.3 mg/dL (ref 8.4–10.5)
Chloride: 100 mEq/L (ref 96–112)
Creatinine, Ser: 0.73 mg/dL (ref 0.50–1.35)
GFR calc Af Amer: >90 mL/min (ref >90)
GFR calc non Af Amer: >90 mL/min (ref >90)
Glucose, Bld: 99 mg/dL (ref 70–99)
Potassium: 3.1 mEq/L — ABNORMAL LOW (ref 3.5–5.1)
Sodium: 140 mEq/L (ref 135–145)
Total Bilirubin: 1.2 mg/dL (ref 0.3–1.2)
Total Protein: 9.2 g/dL — ABNORMAL HIGH (ref 6.0–8.3)

## 2012-07-27 LAB — GLUCOSE, CAPILLARY: Glucose-Capillary: 95 mg/dL (ref 70–99)

## 2012-07-27 LAB — URINALYSIS, ROUTINE W REFLEX MICROSCOPIC
Glucose, UA: NEGATIVE mg/dL
Ketones, ur: 15 mg/dL — AB
Leukocytes, UA: NEGATIVE
Nitrite: NEGATIVE
Protein, ur: 100 mg/dL — AB
Specific Gravity, Urine: 1.02 (ref 1.005–1.030)
Urobilinogen, UA: 1 mg/dL (ref 0.0–1.0)
pH: 5.5 (ref 5.0–8.0)

## 2012-07-27 LAB — URINE MICROSCOPIC-ADD ON

## 2012-07-27 LAB — LIPASE, BLOOD: Lipase: 30 U/L (ref 11–59)

## 2012-07-27 MED ORDER — INFLUENZA VIRUS VACC SPLIT PF IM SUSP
0.5000 mL | INTRAMUSCULAR | Status: AC
  Filled 2012-07-27: qty 0.5

## 2012-07-27 MED ORDER — LISINOPRIL 5 MG PO TABS
5.0000 mg | ORAL_TABLET | Freq: Two times a day (BID) | ORAL | Status: DC
  Administered 2012-07-27 – 2012-07-30 (×4): 5 mg via ORAL
  Filled 2012-07-27 (×7): qty 1

## 2012-07-27 MED ORDER — METOPROLOL TARTRATE 50 MG PO TABS
50.0000 mg | ORAL_TABLET | Freq: Two times a day (BID) | ORAL | Status: DC
  Administered 2012-07-27 – 2012-07-30 (×4): 50 mg via ORAL
  Filled 2012-07-27 (×7): qty 1

## 2012-07-27 MED ORDER — PSYLLIUM 95 % PO PACK
1.0000 | PACK | Freq: Two times a day (BID) | ORAL | Status: DC
  Administered 2012-07-29: 1 via ORAL
  Filled 2012-07-27 (×7): qty 1

## 2012-07-27 MED ORDER — HYDROMORPHONE HCL PF 1 MG/ML IJ SOLN
0.5000 mg | INTRAMUSCULAR | Status: DC | PRN
  Administered 2012-07-28: 2 mg via INTRAVENOUS
  Administered 2012-07-28: 1 mg via INTRAVENOUS
  Filled 2012-07-27 (×2): qty 2
  Filled 2012-07-27: qty 1

## 2012-07-27 MED ORDER — INSULIN ASPART 100 UNIT/ML ~~LOC~~ SOLN
0.0000 [IU] | Freq: Three times a day (TID) | SUBCUTANEOUS | Status: DC
  Administered 2012-07-29: 2 [IU] via SUBCUTANEOUS

## 2012-07-27 MED ORDER — CIPROFLOXACIN IN D5W 400 MG/200ML IV SOLN
400.0000 mg | Freq: Two times a day (BID) | INTRAVENOUS | Status: DC
  Administered 2012-07-28 (×2): 400 mg via INTRAVENOUS
  Filled 2012-07-27 (×4): qty 200

## 2012-07-27 MED ORDER — PANTOPRAZOLE SODIUM 40 MG PO TBEC
40.0000 mg | DELAYED_RELEASE_TABLET | Freq: Every day | ORAL | Status: DC
  Administered 2012-07-29 – 2012-07-30 (×2): 40 mg via ORAL
  Filled 2012-07-27 (×3): qty 1

## 2012-07-27 MED ORDER — ACETAMINOPHEN 325 MG PO TABS: 650.0000 mg | ORAL_TABLET | Freq: Four times a day (QID) | ORAL | Status: DC | PRN

## 2012-07-27 MED ORDER — HYDROMORPHONE HCL PF 1 MG/ML IJ SOLN
1.0000 mg | Freq: Once | INTRAMUSCULAR | Status: AC
  Administered 2012-07-27: 1 mg via INTRAVENOUS
  Filled 2012-07-27: qty 1

## 2012-07-27 MED ORDER — ONDANSETRON HCL 4 MG/2ML IJ SOLN
4.0000 mg | Freq: Once | INTRAMUSCULAR | Status: AC
  Administered 2012-07-27: 4 mg via INTRAVENOUS
  Filled 2012-07-27: qty 2

## 2012-07-27 MED ORDER — METRONIDAZOLE IN NACL 5-0.79 MG/ML-% IV SOLN
500.0000 mg | Freq: Four times a day (QID) | INTRAVENOUS | Status: DC
  Administered 2012-07-27 – 2012-07-29 (×8): 500 mg via INTRAVENOUS
  Filled 2012-07-27 (×9): qty 100

## 2012-07-27 MED ORDER — TRAZODONE HCL 50 MG PO TABS
50.0000 mg | ORAL_TABLET | Freq: Every day | ORAL | Status: DC
  Administered 2012-07-27: 50 mg via ORAL
  Filled 2012-07-27 (×4): qty 1

## 2012-07-27 MED ORDER — LIP MEDEX EX OINT
1.0000 "application " | TOPICAL_OINTMENT | Freq: Two times a day (BID) | CUTANEOUS | Status: DC
  Administered 2012-07-27 – 2012-07-29 (×4): 1 via TOPICAL
  Filled 2012-07-27 (×3): qty 7

## 2012-07-27 MED ORDER — ACETAMINOPHEN 650 MG RE SUPP: 650.0000 mg | Freq: Four times a day (QID) | RECTAL | Status: DC | PRN

## 2012-07-27 MED ORDER — DIPHENHYDRAMINE HCL 12.5 MG/5ML PO ELIX: 12.5000 mg | ORAL_SOLUTION | Freq: Four times a day (QID) | ORAL | Status: DC | PRN

## 2012-07-27 MED ORDER — GABAPENTIN 600 MG PO TABS
600.0000 mg | ORAL_TABLET | Freq: Three times a day (TID) | ORAL | Status: DC
  Filled 2012-07-27: qty 1

## 2012-07-27 MED ORDER — MAGNESIUM HYDROXIDE 400 MG/5ML PO SUSP: 30.0000 mL | Freq: Two times a day (BID) | ORAL | Status: DC | PRN

## 2012-07-27 MED ORDER — CLINDAMYCIN PHOSPHATE 600 MG/50ML IV SOLN
600.0000 mg | Freq: Once | INTRAVENOUS | Status: AC
  Administered 2012-07-27: 600 mg via INTRAVENOUS
  Filled 2012-07-27: qty 50

## 2012-07-27 MED ORDER — SACCHAROMYCES BOULARDII 250 MG PO CAPS
500.0000 mg | ORAL_CAPSULE | Freq: Two times a day (BID) | ORAL | Status: DC
  Administered 2012-07-29 – 2012-07-30 (×3): 500 mg via ORAL
  Filled 2012-07-27 (×7): qty 2

## 2012-07-27 MED ORDER — PROMETHAZINE HCL 25 MG/ML IJ SOLN
12.5000 mg | Freq: Four times a day (QID) | INTRAMUSCULAR | Status: DC | PRN
  Filled 2012-07-27: qty 1

## 2012-07-27 MED ORDER — QUETIAPINE FUMARATE 25 MG PO TABS
25.0000 mg | ORAL_TABLET | Freq: Two times a day (BID) | ORAL | Status: DC
  Administered 2012-07-29: 25 mg via ORAL
  Filled 2012-07-27 (×7): qty 1

## 2012-07-27 MED ORDER — ONDANSETRON HCL 4 MG/2ML IJ SOLN: 4.0000 mg | Freq: Four times a day (QID) | INTRAMUSCULAR | Status: DC | PRN

## 2012-07-27 MED ORDER — SODIUM CHLORIDE 0.9 % IV BOLUS (SEPSIS)
2000.0000 mL | Freq: Once | INTRAVENOUS | Status: AC
  Administered 2012-07-27: 2000 mL via INTRAVENOUS

## 2012-07-27 MED ORDER — METOPROLOL TARTRATE 12.5 MG HALF TABLET
12.5000 mg | ORAL_TABLET | Freq: Two times a day (BID) | ORAL | Status: DC | PRN
  Filled 2012-07-27: qty 1

## 2012-07-27 MED ORDER — DIPHENHYDRAMINE HCL 50 MG/ML IJ SOLN
12.5000 mg | Freq: Four times a day (QID) | INTRAMUSCULAR | Status: DC | PRN
  Administered 2012-07-29: 25 mg via INTRAVENOUS
  Filled 2012-07-27: qty 1

## 2012-07-27 MED ORDER — FENTANYL 25 MCG/HR TD PT72
25.0000 ug | MEDICATED_PATCH | TRANSDERMAL | Status: DC
  Administered 2012-07-27: 25 ug via TRANSDERMAL
  Filled 2012-07-27: qty 1

## 2012-07-27 MED ORDER — LACTATED RINGERS IV SOLN
INTRAVENOUS | Status: DC
  Administered 2012-07-27 – 2012-07-28 (×3): via INTRAVENOUS

## 2012-07-27 MED ORDER — DEXTROSE 5 % IV SOLN
1.0000 g | Freq: Once | INTRAVENOUS | Status: DC
  Filled 2012-07-27: qty 1

## 2012-07-27 MED ORDER — LACTATED RINGERS IV BOLUS (SEPSIS): 1000.0000 mL | Freq: Three times a day (TID) | INTRAVENOUS | Status: AC | PRN

## 2012-07-27 MED ORDER — MAGIC MOUTHWASH
15.0000 mL | Freq: Four times a day (QID) | ORAL | Status: DC | PRN
  Filled 2012-07-27: qty 15

## 2012-07-27 MED ORDER — GABAPENTIN 300 MG PO CAPS
600.0000 mg | ORAL_CAPSULE | Freq: Three times a day (TID) | ORAL | Status: DC
  Administered 2012-07-27 – 2012-07-30 (×6): 600 mg via ORAL
  Filled 2012-07-27 (×10): qty 2

## 2012-07-27 MED ORDER — ALUM & MAG HYDROXIDE-SIMETH 200-200-20 MG/5ML PO SUSP
30.0000 mL | Freq: Four times a day (QID) | ORAL | Status: DC | PRN
Start: 2012-07-27 — End: 2012-07-30

## 2012-07-27 NOTE — ED Notes (Signed)
Pt states that he does not have to use the restroom at the moment but he will try later.

## 2012-07-27 NOTE — ED Notes (Signed)
Pt c/o n/v/d that began two days ago.
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
## 2012-07-27 NOTE — ED Notes (Signed)
[id number]<KV> Expected date:<BR> Expected time:<BR> Means of arrival:Ambulance<BR> Comments:<BR> N/v/d

## 2012-07-28 ENCOUNTER — Encounter (HOSPITAL_COMMUNITY): Payer: Self-pay | Admitting: Certified Registered Nurse Anesthetist

## 2012-07-28 ENCOUNTER — Observation Stay (HOSPITAL_COMMUNITY): Payer: Medicaid Other | Admitting: Certified Registered Nurse Anesthetist

## 2012-07-28 ENCOUNTER — Encounter (HOSPITAL_COMMUNITY)
Admission: EM | Disposition: A | Discharge status: Home / Self Care | Payer: Self-pay | Source: Home / Self Care | Attending: Emergency Medicine

## 2012-07-28 HISTORY — PX: CHOLECYSTECTOMY: SHX55

## 2012-07-28 LAB — COMPREHENSIVE METABOLIC PANEL
Albumin: 3.2 g/dL — ABNORMAL LOW (ref 3.5–5.2)
BUN: 5 mg/dL — ABNORMAL LOW (ref 6–23)
Calcium: 8.4 mg/dL (ref 8.4–10.5)
Creatinine, Ser: 0.69 mg/dL (ref 0.50–1.35)
GFR calc Af Amer: >90 mL/min (ref >90)
Glucose, Bld: 88 mg/dL (ref 70–99)
Total Protein: 7.1 g/dL (ref 6.0–8.3)

## 2012-07-28 LAB — CBC
MCH: 28.1 pg (ref 26.0–34.0)
MCHC: 32.7 g/dL (ref 30.0–36.0)
Platelets: 261 10*3/uL (ref 150–400)

## 2012-07-28 LAB — MRSA PCR SCREENING: MRSA by PCR: NEGATIVE

## 2012-07-28 LAB — HEMOGLOBIN A1C
Hgb A1c MFr Bld: 5.9 % — ABNORMAL HIGH (ref <5.7)
Mean Plasma Glucose: 123 mg/dL — ABNORMAL HIGH (ref <117)

## 2012-07-28 LAB — GLUCOSE, CAPILLARY
Glucose-Capillary: 95 mg/dL (ref 70–99)
Glucose-Capillary: 98 mg/dL (ref 70–99)

## 2012-07-28 SURGERY — LAPAROSCOPIC CHOLECYSTECTOMY
Anesthesia: General | Site: Abdomen | Wound class: Clean Contaminated

## 2012-07-28 MED ORDER — HYDRALAZINE HCL 20 MG/ML IJ SOLN
5.0000 mg | INTRAMUSCULAR | Status: AC
  Administered 2012-07-28 (×5): 5 mg via INTRAVENOUS

## 2012-07-28 MED ORDER — ENSURE COMPLETE PO LIQD: 237.0000 mL | Freq: Two times a day (BID) | ORAL | Status: DC

## 2012-07-28 MED ORDER — SODIUM CHLORIDE 0.9 % IR SOLN
Status: DC | PRN
  Administered 2012-07-28: 1000 mL

## 2012-07-28 MED ORDER — MIDAZOLAM HCL 5 MG/5ML IJ SOLN
INTRAMUSCULAR | Status: DC | PRN
  Administered 2012-07-28: 2 mg via INTRAVENOUS

## 2012-07-28 MED ORDER — ACETAMINOPHEN 10 MG/ML IV SOLN
INTRAVENOUS | Status: DC | PRN
  Administered 2012-07-28: 1000 mg via INTRAVENOUS

## 2012-07-28 MED ORDER — HYDROCODONE-ACETAMINOPHEN 5-325 MG PO TABS
1.0000 | ORAL_TABLET | ORAL | Status: DC | PRN
  Administered 2012-07-28: 2 via ORAL
  Filled 2012-07-28 (×2): qty 2

## 2012-07-28 MED ORDER — SUCCINYLCHOLINE CHLORIDE 20 MG/ML IJ SOLN
INTRAMUSCULAR | Status: DC | PRN
  Administered 2012-07-28: 100 mg via INTRAVENOUS

## 2012-07-28 MED ORDER — PROPOFOL 10 MG/ML IV BOLUS
INTRAVENOUS | Status: DC | PRN
  Administered 2012-07-28: 180 mg via INTRAVENOUS

## 2012-07-28 MED ORDER — ONDANSETRON HCL 4 MG/2ML IJ SOLN
INTRAMUSCULAR | Status: DC | PRN
  Administered 2012-07-28: 4 mg via INTRAVENOUS

## 2012-07-28 MED ORDER — LACTATED RINGERS IV SOLN
INTRAVENOUS | Status: DC | PRN
  Administered 2012-07-28 (×2): via INTRAVENOUS

## 2012-07-28 MED ORDER — LACTATED RINGERS IV SOLN: INTRAVENOUS | Status: DC

## 2012-07-28 MED ORDER — FENTANYL CITRATE 0.05 MG/ML IJ SOLN
INTRAMUSCULAR | Status: DC | PRN
  Administered 2012-07-28: 50 ug via INTRAVENOUS
  Administered 2012-07-28 (×2): 100 ug via INTRAVENOUS

## 2012-07-28 MED ORDER — LABETALOL HCL 5 MG/ML IV SOLN
INTRAVENOUS | Status: DC | PRN
  Administered 2012-07-28: 10 mg via INTRAVENOUS

## 2012-07-28 MED ORDER — HYDROMORPHONE HCL PF 1 MG/ML IJ SOLN
0.2500 mg | INTRAMUSCULAR | Status: DC | PRN
  Administered 2012-07-28 (×2): 0.5 mg via INTRAVENOUS

## 2012-07-28 MED ORDER — BUPIVACAINE HCL (PF) 0.5 % IJ SOLN
INTRAMUSCULAR | Status: DC | PRN
  Administered 2012-07-28: 20 mL

## 2012-07-28 MED ORDER — ROCURONIUM BROMIDE 100 MG/10ML IV SOLN
INTRAVENOUS | Status: DC | PRN
  Administered 2012-07-28: 5 mg via INTRAVENOUS
  Administered 2012-07-28: 15 mg via INTRAVENOUS

## 2012-07-28 MED ORDER — PROMETHAZINE HCL 25 MG/ML IJ SOLN: 6.2500 mg | INTRAMUSCULAR | Status: DC | PRN

## 2012-07-28 MED ORDER — RINGERS IRRIGATION IR SOLN
Status: DC | PRN
  Administered 2012-07-28: 1000 mL

## 2012-07-28 SURGICAL SUPPLY — 35 items
APL SKNCLS STERI-STRIP NONHPOA (GAUZE/BANDAGES/DRESSINGS) ×1
APPLIER CLIP 5 13 M/L LIGAMAX5 (MISCELLANEOUS) ×2
APR CLP MED LRG 5 ANG JAW (MISCELLANEOUS) ×1
BAG SPEC RTRVL LRG 6X4 10 (ENDOMECHANICALS) ×1
BANDAGE ADHESIVE 1X3 (GAUZE/BANDAGES/DRESSINGS) ×8 IMPLANT
BENZOIN TINCTURE PRP APPL 2/3 (GAUZE/BANDAGES/DRESSINGS) ×2 IMPLANT
CANISTER SUCTION 2500CC (MISCELLANEOUS) ×2 IMPLANT
CHLORAPREP W/TINT 26ML (MISCELLANEOUS) ×2 IMPLANT
CLIP APPLIE 5 13 M/L LIGAMAX5 (MISCELLANEOUS) ×1 IMPLANT
CLOTH BEACON ORANGE TIMEOUT ST (SAFETY) ×2 IMPLANT
COVER MAYO STAND STRL (DRAPES) IMPLANT
DECANTER SPIKE VIAL GLASS SM (MISCELLANEOUS) ×1 IMPLANT
DRAPE C-ARM 42X72 X-RAY (DRAPES) IMPLANT
DRAPE LAPAROSCOPIC ABDOMINAL (DRAPES) ×2 IMPLANT
ELECT REM PT RETURN 9FT ADLT (ELECTROSURGICAL) ×2
ELECTRODE REM PT RTRN 9FT ADLT (ELECTROSURGICAL) ×1 IMPLANT
GLOVE BIOGEL PI IND STRL 7.0 (GLOVE) ×1 IMPLANT
GLOVE BIOGEL PI INDICATOR 7.0 (GLOVE) ×1
GLOVE SURG SIGNA 7.5 PF LTX (GLOVE) ×4 IMPLANT
GOWN STRL NON-REIN LRG LVL3 (GOWN DISPOSABLE) ×2 IMPLANT
GOWN STRL REIN XL XLG (GOWN DISPOSABLE) ×4 IMPLANT
HEMOSTAT SURGICEL 4X8 (HEMOSTASIS) IMPLANT
KIT BASIN OR (CUSTOM PROCEDURE TRAY) ×2 IMPLANT
NS IRRIG 1000ML POUR BTL (IV SOLUTION) ×1 IMPLANT
POUCH SPECIMEN RETRIEVAL 10MM (ENDOMECHANICALS) ×1 IMPLANT
SET CHOLANGIOGRAPH MIX (MISCELLANEOUS) IMPLANT
SET IRRIG TUBING LAPAROSCOPIC (IRRIGATION / IRRIGATOR) ×2 IMPLANT
SOLUTION ANTI FOG 6CC (MISCELLANEOUS) ×2 IMPLANT
STRIP CLOSURE SKIN 1/2X4 (GAUZE/BANDAGES/DRESSINGS) ×2 IMPLANT
SUT MNCRL AB 4-0 PS2 18 (SUTURE) ×2 IMPLANT
TOWEL OR 17X26 10 PK STRL BLUE (TOWEL DISPOSABLE) ×2 IMPLANT
TRAY LAP CHOLE (CUSTOM PROCEDURE TRAY) ×2 IMPLANT
TROCAR BLADELESS OPT 5 75 (ENDOMECHANICALS) ×6 IMPLANT
TROCAR XCEL BLUNT TIP 100MML (ENDOMECHANICALS) ×2 IMPLANT
TUBING INSUFFLATION 10FT LAP (TUBING) ×2 IMPLANT
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
## 2012-07-28 NOTE — Progress Notes (Signed)
Patient refused all PO meds, stated "I don't want to swallow anything that might mess up what's going on in my stomach" I explained to the patient that the medication was given to help correct those problems, but the patient refused still.  Will continue to monitor

## 2012-07-28 NOTE — Op Note (Signed)
Laparoscopic Cholecystectomy Procedure Note  Indications: This patient presents with symptomatic gallbladder disease and will undergo laparoscopic cholecystectomy.  Pre-operative Diagnosis: Calculus of gallbladder with acute cholecystitis, without mention of obstruction  Post-operative Diagnosis: Same  Surgeon: Abigail Miyamoto A   Assistants: 0  Anesthesia: General endotracheal anesthesia  ASA Class: 3  Procedure Details  The patient was seen again in the Holding Room. The risks, benefits, complications, treatment options, and expected outcomes were discussed with the patient. The possibilities of reaction to medication, pulmonary aspiration, perforation of viscus, bleeding, recurrent infection, finding a normal gallbladder, the need for additional procedures, failure to diagnose a condition, the possible need to convert to an open procedure, and creating a complication requiring transfusion or operation were discussed with the patient. The likelihood of improving the patient's symptoms with return to their baseline status is good.  The patient and/or family concurred with the proposed plan, giving informed consent. The site of surgery properly noted. The patient was taken to Operating Room, identified as Miguel Brady and the procedure verified as Laparoscopic Cholecystectomy with Intraoperative Cholangiogram. A Time Out was held and the above information confirmed.  Prior to the induction of general anesthesia, antibiotic prophylaxis was administered. General endotracheal anesthesia was then administered and tolerated well. After the induction, the abdomen was prepped with Chloraprep and draped in sterile fashion. The patient was positioned in the supine position.  Local anesthetic agent was injected into the skin near the umbilicus and an incision made. We dissected down to the abdominal fascia with blunt dissection.  The fascia was incised vertically and we entered the peritoneal cavity  bluntly.  A pursestring suture of 0-Vicryl was placed around the fascial opening.  The Hasson cannula was inserted and secured with the stay suture.  Pneumoperitoneum was then created with CO2 and tolerated well without any adverse changes in the patient's vital signs. An 11-mm port was placed in the subxiphoid position.  Two 5-mm ports were placed in the right upper quadrant. All skin incisions were infiltrated with a local anesthetic agent before making the incision and placing the trocars.   We positioned the patient in reverse Trendelenburg, tilted slightly to the patient's left.  The gallbladder was identified, the fundus grasped and retracted cephalad. Adhesions were lysed bluntly and with the electrocautery where indicated, taking care not to injure any adjacent organs or viscus. The infundibulum was grasped and retracted laterally, exposing the peritoneum overlying the triangle of Calot. This was then divided and exposed in a blunt fashion. The cystic duct was clearly identified and bluntly dissected circumferentially. A critical view of the cystic duct and cystic artery was obtained.  The cystic duct was then ligated with clips and divided. The cystic artery was, dissected free, ligated with clips and divided as well.   The gallbladder was dissected from the liver bed in retrograde fashion with the electrocautery. The gallbladder was removed and placed in an Endocatch sac. The liver bed was irrigated and inspected. Hemostasis was achieved with the electrocautery. Copious irrigation was utilized and was repeatedly aspirated until clear.  The gallbladder and Endocatch sac were then removed through the umbilical port site.  The pursestring suture was used to close the umbilical fascia.    We again inspected the right upper quadrant for hemostasis.  Pneumoperitoneum was released as we removed the trocars.  4-0 Monocryl was used to close the skin.   Benzoin, steri-strips, and clean dressings were applied.  The patient was then extubated and brought to the recovery  room in stable condition. Instrument, sponge, and needle counts were correct at closure and at the conclusion of the case.   Findings: Cholecystitis with Cholelithiasis  Estimated Blood Loss: Minimal         Drains: 0         Specimens: Gallbladder           Complications: None; patient tolerated the procedure well.         Disposition: PACU - hemodynamically stable.         Condition: stable

## 2012-07-28 NOTE — Anesthesia Postprocedure Evaluation (Signed)
Anesthesia Post Note  Patient: Miguel Brady  Procedure(s) Performed: Procedure(s) (LRB): LAPAROSCOPIC CHOLECYSTECTOMY (N/A)  Anesthesia type: General  Patient location: PACU  Post pain: Pain level controlled  Post assessment: Post-op Vital signs reviewed  Last Vitals:  Filed Vitals:   07/28/12 1411  BP: 155/78  Pulse: 64  Temp: 36.5 C  Resp: 16    Post vital signs: Reviewed  Level of consciousness: sedated  Complications: No apparent anesthesia complications
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
## 2012-07-28 NOTE — Interval H&P Note (Signed)
History and Physical Interval Note:  07/28/2012 9:15 AM  Miguel Brady  has presented today for surgery, with the diagnosis of cholelithiases  The various methods of treatment have been discussed with the patient and family. After consideration of risks, benefits and other options for treatment, the patient has consented to  Procedure(s) (LRB) with comments: LAPAROSCOPIC CHOLECYSTECTOMY WITH INTRAOPERATIVE CHOLANGIOGRAM (N/A) as a surgical intervention .  The patient's history has been reviewed, patient examined, no change in status, stable for surgery.  I have reviewed the patient's chart and labs.  Questions were answered to the patient's satisfaction.     [name]

## 2012-07-28 NOTE — Progress Notes (Signed)
PT Cancellation Note  Patient Details Name: Miguel Brady MRN: [id number] DOB: 1960/11/14   Cancelled Treatment:     Order received. Chart reviewed. Pt is out of room undergoing surgical procedure. Will hold PT eval today. Will check back on tomorrow to attempt evaluation. Thanks.    Rebeca Alert [facility] 07/28/2012, 9:30 AM [phone]

## 2012-07-28 NOTE — Progress Notes (Signed)
Patient ID: Miguel Brady, male   DOB: 1960-09-18, 51 y.o.   MRN: [id number]  No complaints AF/VSS Abdomen soft with mild tenderness  Plan:  Lap chole  Risks including but not limited to bleeding, infection, bile leak, bile duct injury, injury to other structures, need to convert to an open procedure, etc discussed.  He agrees to proceed. Likelihood of success is good.
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
## 2012-07-28 NOTE — Transfer of Care (Signed)
Immediate Anesthesia Transfer of Care Note  Patient: Miguel Brady  Procedure(s) Performed: Procedure(s) (LRB) with comments: LAPAROSCOPIC CHOLECYSTECTOMY WITH INTRAOPERATIVE CHOLANGIOGRAM (N/A)  Patient Location: PACU  Anesthesia Type:General  Level of Consciousness: awake, alert  and oriented  Airway & Oxygen Therapy: Patient Spontanous Breathing and Patient connected to face mask oxygen  Post-op Assessment: Report given to PACU RN and Post -op Vital signs reviewed and stable  Post vital signs: Reviewed and stable  Complications: No apparent anesthesia complications

## 2012-07-28 NOTE — Progress Notes (Signed)
OT Cancellation Note  Patient Details Name: KIETH HARTIS MRN: [id number] DOB: [birth year]   Cancelled Treatment:     Pt out for procedure.  Will check back tomorrow.    Siyona Coto 07/28/2012, 9:48 AM Marica Otter, OTR/L [phone] 07/28/2012

## 2012-07-29 ENCOUNTER — Encounter (HOSPITAL_COMMUNITY): Payer: Self-pay | Admitting: Surgery

## 2012-07-29 LAB — BASIC METABOLIC PANEL
BUN: 5 mg/dL — ABNORMAL LOW (ref 6–23)
CO2: 27 mEq/L (ref 19–32)
Chloride: 100 mEq/L (ref 96–112)
Creatinine, Ser: 0.7 mg/dL (ref 0.50–1.35)
Glucose, Bld: 92 mg/dL (ref 70–99)

## 2012-07-29 LAB — GLUCOSE, CAPILLARY: Glucose-Capillary: 86 mg/dL (ref 70–99)

## 2012-07-29 MED ORDER — ASPIRIN EC 81 MG PO TBEC
81.0000 mg | DELAYED_RELEASE_TABLET | Freq: Every day | ORAL | Status: DC
  Administered 2012-07-29 – 2012-07-30 (×2): 81 mg via ORAL
  Filled 2012-07-29 (×2): qty 1

## 2012-07-29 MED ORDER — HYDROMORPHONE HCL PF 1 MG/ML IJ SOLN: 0.5000 mg | INTRAMUSCULAR | Status: DC | PRN

## 2012-07-29 MED ORDER — OXYCODONE HCL 5 MG PO TABS: 15.0000 mg | ORAL_TABLET | ORAL | Status: DC | PRN

## 2012-07-29 MED ORDER — CHOLESTYRAMINE 4 G PO PACK
1.0000 | PACK | Freq: Every day | ORAL | Status: DC
  Administered 2012-07-29: 1 via ORAL
  Filled 2012-07-29 (×2): qty 1
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
## 2012-07-29 NOTE — Progress Notes (Signed)
I have seen and examined the patient and agree with the assessment and plans.  Ilias Stcharles A. Timaya Bojarski  MD, FACS  
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
## 2012-07-29 NOTE — Progress Notes (Signed)
Met with pt and his mother to discuss d/c planning. Plan is for him to return to his mother's house at discharge. Please see OT note for equipment recommendation.

## 2012-07-30 LAB — COMPREHENSIVE METABOLIC PANEL
ALT: 7 U/L (ref 0–53)
AST: 13 U/L (ref 0–37)
Albumin: 3 g/dL — ABNORMAL LOW (ref 3.5–5.2)
Calcium: 8.6 mg/dL (ref 8.4–10.5)
GFR calc Af Amer: >90 mL/min (ref >90)
Sodium: 140 mEq/L (ref 135–145)
Total Protein: 6.8 g/dL (ref 6.0–8.3)

## 2012-07-30 LAB — CBC
MCH: 27.8 pg (ref 26.0–34.0)
MCHC: 31.9 g/dL (ref 30.0–36.0)
Platelets: 255 10*3/uL (ref 150–400)

## 2012-07-30 LAB — GLUCOSE, CAPILLARY
Glucose-Capillary: 102 mg/dL — ABNORMAL HIGH (ref 70–99)
Glucose-Capillary: 100 mg/dL — ABNORMAL HIGH (ref 70–99)
Glucose-Capillary: 93 mg/dL (ref 70–99)
Glucose-Capillary: 102 mg/dL — ABNORMAL HIGH (ref 70–99)

## 2012-07-30 MED ORDER — INFLUENZA VIRUS VACC SPLIT PF IM SUSP: 0.5000 mL | INTRAMUSCULAR | Status: DC | PRN

## 2012-07-30 MED ORDER — INFLUENZA VIRUS VACC SPLIT PF IM SUSP
0.5000 mL | Freq: Once | INTRAMUSCULAR | Status: AC
  Administered 2012-07-30: 0.5 mL via INTRAMUSCULAR
  Filled 2012-07-30: qty 0.5

## 2012-07-30 MED ORDER — PNEUMOCOCCAL VAC POLYVALENT 25 MCG/0.5ML IJ INJ
0.5000 mL | INJECTION | Freq: Once | INTRAMUSCULAR | Status: DC
  Filled 2012-07-30: qty 0.5

## 2012-07-30 MED ORDER — PNEUMOCOCCAL VAC POLYVALENT 25 MCG/0.5ML IJ INJ: 0.5000 mL | INJECTION | INTRAMUSCULAR | Status: DC | PRN

## 2012-07-30 MED ORDER — PNEUMOCOCCAL VAC POLYVALENT 25 MCG/0.5ML IJ INJ
0.5000 mL | INJECTION | Freq: Once | INTRAMUSCULAR | Status: AC
  Administered 2012-07-30: 0.5 mL via INTRAMUSCULAR
  Filled 2012-07-30: qty 0.5

## 2012-07-30 MED ORDER — INFLUENZA VIRUS VACC SPLIT PF IM SUSP
0.5000 mL | Freq: Once | INTRAMUSCULAR | Status: DC
  Filled 2012-07-30: qty 0.5

## 2012-07-30 NOTE — Progress Notes (Addendum)
   07/29/12 1433  PT G-Codes **NOT FOR INPATIENT CLASS**  Functional Assessment Tool Used clincal judgement from chart review performed  Functional Limitation Mobility: Walking and moving around  Mobility: Walking and Moving Around Current Status (Z6109) CI  Mobility: Walking and Moving Around Discharge Status [phone]) CI   G-codes entered by Marella Bile, PT upon chart review and documentation/assement performed by Blanchard Kelch, PT

## 2012-07-30 NOTE — Progress Notes (Signed)
Received consult about pt wanting to go to rehab. I met with pt to discuss this, he did not tell me about wanting to go to rehab but talked about wanting to stay one more night because he will miss the staff and the hospital bed. I explained to him that does not qualify him to stay in the hospital. I spoke with attending who plans to discharge him today. Spoke with Maudie Flakes pt's mother and made her aware of d/c today.
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
## 2012-07-30 NOTE — Progress Notes (Signed)
Patient discharged home with mother, alert and oriented, discharge orders given patient verbalize understanding of orders given, patient in stable condition at this time

## 2012-07-30 NOTE — Discharge Summary (Signed)
I have seen and examined the patient and agree with the assessment and plans.  Lida Berkery A. Azai Gaffin  MD, FACS  

## 2012-07-30 NOTE — Progress Notes (Signed)
OT Note:  Pt preparing for d/c.  Checked with pt/mother for any OT needs prior to d/c, and they have none.  [place], OTR/L [phone] 07/30/2012

## 2012-08-10 ENCOUNTER — Encounter: Payer: Self-pay | Admitting: Physical Medicine & Rehabilitation

## 2012-08-10 ENCOUNTER — Encounter: Payer: Medicaid Other | Attending: Physical Medicine & Rehabilitation | Admitting: Physical Medicine & Rehabilitation

## 2012-08-10 VITALS — BP 190/101 | HR 65 | Resp 14 | Ht 74.0 in | Wt 240.8 lb

## 2012-08-10 DIAGNOSIS — S329XXA Fracture of unspecified parts of lumbosacral spine and pelvis, initial encounter for closed fracture: Secondary | ICD-10-CM | POA: Insufficient documentation

## 2012-08-10 DIAGNOSIS — S020XXA Fracture of vault of skull, initial encounter for closed fracture: Secondary | ICD-10-CM | POA: Insufficient documentation

## 2012-08-10 DIAGNOSIS — S82209B Unspecified fracture of shaft of unspecified tibia, initial encounter for open fracture type I or II: Secondary | ICD-10-CM | POA: Insufficient documentation

## 2012-08-10 DIAGNOSIS — S32009A Unspecified fracture of unspecified lumbar vertebra, initial encounter for closed fracture: Secondary | ICD-10-CM

## 2012-08-10 DIAGNOSIS — S7290XB Unspecified fracture of unspecified femur, initial encounter for open fracture type I or II: Secondary | ICD-10-CM | POA: Insufficient documentation

## 2012-08-10 DIAGNOSIS — S82009A Unspecified fracture of unspecified patella, initial encounter for closed fracture: Secondary | ICD-10-CM | POA: Insufficient documentation

## 2012-08-10 DIAGNOSIS — S82202B Unspecified fracture of shaft of left tibia, initial encounter for open fracture type I or II: Secondary | ICD-10-CM

## 2012-08-10 DIAGNOSIS — E119 Type 2 diabetes mellitus without complications: Secondary | ICD-10-CM | POA: Insufficient documentation

## 2012-08-10 DIAGNOSIS — K812 Acute cholecystitis with chronic cholecystitis: Secondary | ICD-10-CM | POA: Insufficient documentation

## 2012-08-10 DIAGNOSIS — X58XXXA Exposure to other specified factors, initial encounter: Secondary | ICD-10-CM | POA: Insufficient documentation

## 2012-08-10 DIAGNOSIS — S7292XB Unspecified fracture of left femur, initial encounter for open fracture type I or II: Secondary | ICD-10-CM

## 2012-08-10 MED ORDER — GABAPENTIN 300 MG PO CAPS: 300.0000 mg | ORAL_CAPSULE | Freq: Three times a day (TID) | ORAL | Status: DC

## 2012-08-10 MED ORDER — TRAMADOL HCL 50 MG PO TABS: 50.0000 mg | ORAL_TABLET | Freq: Three times a day (TID) | ORAL | Status: DC | PRN

## 2012-08-10 NOTE — Patient Instructions (Addendum)
HOLD YOUR METFORMIN!!!! THIS MAY BE CAUSING YOUR DIARRHEA

## 2012-08-10 NOTE — Progress Notes (Signed)
Subjective:    Patient ID: Miguel Brady, male    DOB: 1961/01/08, 51 y.o.   MRN: [id number]  HPI  Mr. Ferrari is back regarding his TBI and polytrauma. He went home at the beginning of November after he was dc'ed from the SNF. He had to go back in the hospital to remove his gall bladder unfortunately but has since recovered from that.   He is still having pain with his left knee. He stopped his pain meds as he was confused as to what medication was for which problem. He is only taking medicines for his blood pressure, cholesterol, sugars, GERD, etc.   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  He uses a rolling walker currently. He wants to go back to inpatient rehab. He received a visit from home health but nothing further. He would consider outpt therapies.   Pain Inventory Average Pain 5 Pain Right Now 4 My pain is tingling and aching  In the last 24 hours, has pain interfered with the following? General activity 5 Relation with others 5 Enjoyment of life 5 What TIME of day is your pain at its worst? night Sleep (in general) Poor  Pain is worse with: walking and inactivity Pain improves with: therapy/exercise Relief from Meds: not taking pain medication  Mobility use a walker how many minutes can you walk? 15 ability to climb steps?  yes do you drive?  no  Function I need assistance with the following:  meal prep, household duties and shopping  Neuro/Psych numbness tingling trouble walking  Prior Studies Any changes since last visit?  no  Physicians involved in your care Any changes since last visit?  no   History reviewed. No pertinent family history. History   Social History  . Marital Status: Single    Spouse Name: N/A    Number of Children: N/A  . Years of Education: N/A   Social History Main Topics  . Smoking status: Former Smoker -- 0.5 packs/day for 30 years    Types: Cigarettes    Quit  date: 04/02/2012  . Smokeless tobacco: Never Used  . Alcohol Use: No     Comment: OCCASIONAL  . Drug Use: No  . Sexually Active: None   Other Topics Concern  . None   Social History Narrative   ** Merged History Encounter **    Past Surgical History  Procedure Date  . External fixation pelvis 04/03/2012    Procedure: EXTERNAL FIXATION PELVIS;  Surgeon: Budd Palmer, MD;  Location: [facility] OR;  Service: Orthopedics;;  . External fixation leg 04/03/2012    Procedure: EXTERNAL FIXATION LEG;  Surgeon: Budd Palmer, MD;  Location: [facility] OR;  Service: Orthopedics;  Laterality: Left;  Left femur  . Chest tube insertion 04/03/2012    Procedure: CHEST TUBE INSERTION;  Surgeon: Liz Malady, MD;  Location: [facility] OR;  Service: General;  Laterality: Left;  . Incision and drainage of wound 04/03/2012    Procedure: IRRIGATION AND DEBRIDEMENT WOUND;  Surgeon: Clydene Fake, MD;  Location: [facility] OR;  Service: Neurosurgery;  Laterality: N/A;  Frontal.  . Tibia im nail insertion 04/07/2012    Procedure: INTRAMEDULLARY (IM) NAIL TIBIAL;  Surgeon: Budd Palmer, MD;  Location: [facility] OR;  Service: Orthopedics;  Laterality: Left;  . Femur im nail 04/07/2012    Procedure: INTRAMEDULLARY (IM) NAIL FEMORAL;  Surgeon: Budd Palmer, MD;  Location: [facility] OR;  Service: Orthopedics;  Laterality: Left;  . Orif patella 04/07/2012    Procedure: OPEN REDUCTION INTERNAL (ORIF) FIXATION PATELLA;  Surgeon: Budd Palmer, MD;  Location: [facility] OR;  Service: Orthopedics;  Laterality: Left;  . Orif pelvic fracture 04/07/2012    Procedure: OPEN REDUCTION INTERNAL FIXATION (ORIF) PELVIC FRACTURE;  Surgeon: Budd Palmer, MD;  Location: [facility] OR;  Service: Orthopedics;  Laterality: N/A;  Right and left sacroiliac screw pinning,Irrigation and debridebridement open tibia and femur,removal external fixator.  . Flexible bronchoscopy 04/07/2012    Procedure: FLEXIBLE BRONCHOSCOPY;  Surgeon: Liz Malady, MD;  Location: [facility] OR;  Service:  General;;  START TIME=1645 END TIME=1700  . Cholecystectomy 07/28/2012    Procedure: LAPAROSCOPIC CHOLECYSTECTOMY;  Surgeon: Shelly Rubenstein, MD;  Location: [facility] ORS;  Service: General;  Laterality: N/A;   Past Medical History  Diagnosis Date  . Obesity   . Hypertension   . Diabetes mellitus   . Headache   . TOBACCO ABUSE 12/23/2008  . 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 GERD 05/05/2007  . TRIGGER FINGER 05/05/2007   BP 190/101  Pulse 65  Resp 14  Ht 6\' 2"  (1.88 m)  Wt 240 lb 12.8 oz (109.226 kg)  BMI 30.92 kg/m2  SpO2 96%   Review of Systems  Constitutional: Positive for diaphoresis.  Gastrointestinal: Positive for abdominal pain and diarrhea.  Musculoskeletal: Positive for gait problem.  Neurological: Positive for numbness.       Tingling  All other systems reviewed and are negative.       Objective:   Physical Exam General: Alert and oriented x 3, No apparent distress  HEENT: Head is normocephalic, atraumatic, PERRLA, EOMI, sclera anicteric, oral mucosa pink and moist, dentition intact, ext ear canals clear,  Neck: Supple without JVD or lymphadenopathy  Heart: Reg rate and rhythm. No murmurs rubs or gallops  Chest: CTA bilaterally without wheezes, rales, or rhonchi; no distress  Abdomen: Soft, non-tender, non-distended, bowel sounds positive.  Extremities: No clubbing, cyanosis, or edema. Pulses are 2+  Skin: he has multiple scars over the left leg. There is mild breakdown over the patella. He also has a heel ulcer which is dressed.  Neuro: Pt is alert. Oriented to date, place, reason. Conversationally he's appropriate. He's occasionally a little impulsive but this is decreased. He has mild STM deficits which seem to be improving.. UE strength grossly 5/5. RLE is grossly 4//5. LLE is 1-2 proximally to distal. He has limited left KE/KF due to pain. He has decreased sensory to LT over the anterior/lateral leg and dorsum of the foot and likely the sole as well. There is  also decreased LT over the right foot in a stocking glove distribution Musculoskeletal: Full ROM, No pain with AROM or PROM in the neck, trunk, or extremities. Posture appropriate  Psych: Pt's affect is appropriate. Pt is cooperative   Assessment & Plan:   Assessment:  1. Traumatic brain injury with polytrauma.  2. 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  3. Left femur fracture treated with intramedullary nailing.  4. Left tibia fracture treated with intramedullary nailing.  5. Left patella fracture and infrapatellar tendon injury, treated with open  reduction and internal fixation of patella.  6. Right peroneal nerve(or sciatic branch) injury 7. Diabetic PN?   Plan:  1. Pt told me he was seeing Dr. Carola Frost  next week, so I will refrain from xrays at this time.  2. 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 Has improved ADF today, and as a whole his neuro exam has improved dramatically.    3. Discussed realistic approach to his recovery. He needs to work on improved strength and balance first.  4. Resume neurontin 300mg  tid for peroneal nerve pain, ?diabetic PN. --clearly stated the purpose of medication on his rx. Also wrote for tramadol for breakthrough. 5. I will see the patient back in about 2 months. 6. Begin therapy at [facility] to address gait, lower extremity strengthening, balance, pain. A referral was made today.

## 2012-08-17 ENCOUNTER — Encounter (INDEPENDENT_AMBULATORY_CARE_PROVIDER_SITE_OTHER): Payer: Self-pay | Admitting: Surgery

## 2012-08-17 ENCOUNTER — Ambulatory Visit (INDEPENDENT_AMBULATORY_CARE_PROVIDER_SITE_OTHER): Payer: Medicaid Other | Admitting: Surgery

## 2012-08-17 VITALS — BP 147/88 | HR 74 | Temp 97.0°F | Resp 14 | Ht 74.0 in | Wt 237.0 lb

## 2012-08-17 DIAGNOSIS — Z09 Encounter for follow-up examination after completed treatment for conditions other than malignant neoplasm: Secondary | ICD-10-CM
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
## 2012-08-20 ENCOUNTER — Ambulatory Visit: Payer: Medicaid Other | Attending: Physical Medicine & Rehabilitation | Admitting: Physical Therapy

## 2012-08-20 DIAGNOSIS — IMO0001 Reserved for inherently not codable concepts without codable children: Secondary | ICD-10-CM | POA: Insufficient documentation

## 2012-08-20 DIAGNOSIS — M2560 Stiffness of unspecified joint, not elsewhere classified: Secondary | ICD-10-CM | POA: Insufficient documentation

## 2012-08-20 DIAGNOSIS — R262 Difficulty in walking, not elsewhere classified: Secondary | ICD-10-CM | POA: Insufficient documentation

## 2012-08-20 DIAGNOSIS — M2550 Pain in unspecified joint: Secondary | ICD-10-CM | POA: Insufficient documentation

## 2012-08-20 DIAGNOSIS — R5381 Other malaise: Secondary | ICD-10-CM | POA: Insufficient documentation

## 2012-08-24 ENCOUNTER — Ambulatory Visit: Payer: Medicaid Other | Admitting: Physical Therapy

## 2012-08-26 ENCOUNTER — Ambulatory Visit: Payer: Medicaid Other | Admitting: Physical Therapy

## 2012-08-28 ENCOUNTER — Ambulatory Visit: Payer: Medicaid Other | Admitting: Rehabilitation

## 2012-08-31 ENCOUNTER — Ambulatory Visit: Payer: Medicaid Other | Admitting: Rehabilitation

## 2012-09-02 ENCOUNTER — Ambulatory Visit: Payer: Medicaid Other | Admitting: Rehabilitation

## 2012-09-04 ENCOUNTER — Ambulatory Visit: Payer: Medicaid Other | Admitting: Physical Therapy

## 2012-09-07 ENCOUNTER — Ambulatory Visit: Payer: Medicaid Other | Admitting: Rehabilitation

## 2012-09-11 ENCOUNTER — Ambulatory Visit: Payer: Medicaid Other | Admitting: Physical Therapy

## 2012-09-14 ENCOUNTER — Ambulatory Visit: Payer: Medicaid Other | Admitting: Rehabilitation

## 2012-09-16 ENCOUNTER — Other Ambulatory Visit: Payer: Self-pay | Admitting: Physical Medicine & Rehabilitation

## 2012-09-22 ENCOUNTER — Ambulatory Visit: Payer: Medicaid Other | Attending: Physical Medicine & Rehabilitation | Admitting: Physical Therapy

## 2012-09-22 DIAGNOSIS — M2560 Stiffness of unspecified joint, not elsewhere classified: Secondary | ICD-10-CM | POA: Insufficient documentation

## 2012-09-22 DIAGNOSIS — R5381 Other malaise: Secondary | ICD-10-CM | POA: Insufficient documentation

## 2012-09-22 DIAGNOSIS — R262 Difficulty in walking, not elsewhere classified: Secondary | ICD-10-CM | POA: Insufficient documentation

## 2012-09-22 DIAGNOSIS — M2550 Pain in unspecified joint: Secondary | ICD-10-CM | POA: Insufficient documentation

## 2012-09-22 DIAGNOSIS — IMO0001 Reserved for inherently not codable concepts without codable children: Secondary | ICD-10-CM | POA: Insufficient documentation

## 2012-09-23 ENCOUNTER — Ambulatory Visit: Payer: Medicaid Other | Admitting: Rehabilitation

## 2012-09-25 ENCOUNTER — Ambulatory Visit: Payer: Medicaid Other | Admitting: Physical Therapy

## 2012-09-28 ENCOUNTER — Ambulatory Visit: Payer: Medicaid Other | Admitting: Rehabilitation

## 2012-09-30 ENCOUNTER — Ambulatory Visit: Payer: Medicaid Other | Admitting: Rehabilitation

## 2012-10-02 ENCOUNTER — Ambulatory Visit: Payer: Medicaid Other | Admitting: Rehabilitation

## 2012-10-06 ENCOUNTER — Ambulatory Visit: Payer: Medicaid Other | Admitting: Rehabilitation

## 2012-10-07 ENCOUNTER — Ambulatory Visit: Payer: Medicaid Other | Admitting: Rehabilitation

## 2012-10-09 ENCOUNTER — Encounter: Payer: Self-pay | Admitting: Physical Medicine & Rehabilitation

## 2012-10-09 ENCOUNTER — Encounter: Payer: Medicaid Other | Attending: Physical Medicine & Rehabilitation | Admitting: Physical Medicine & Rehabilitation

## 2012-10-09 VITALS — BP 132/73 | HR 93 | Resp 14 | Ht 74.0 in | Wt 253.0 lb

## 2012-10-09 DIAGNOSIS — S069XAA Unspecified intracranial injury with loss of consciousness status unknown, initial encounter: Secondary | ICD-10-CM | POA: Insufficient documentation

## 2012-10-09 DIAGNOSIS — S7290XB Unspecified fracture of unspecified femur, initial encounter for open fracture type I or II: Secondary | ICD-10-CM | POA: Insufficient documentation

## 2012-10-09 DIAGNOSIS — E119 Type 2 diabetes mellitus without complications: Secondary | ICD-10-CM

## 2012-10-09 DIAGNOSIS — S329XXA Fracture of unspecified parts of lumbosacral spine and pelvis, initial encounter for closed fracture: Secondary | ICD-10-CM

## 2012-10-09 DIAGNOSIS — K812 Acute cholecystitis with chronic cholecystitis: Secondary | ICD-10-CM | POA: Insufficient documentation

## 2012-10-09 DIAGNOSIS — X58XXXA Exposure to other specified factors, initial encounter: Secondary | ICD-10-CM | POA: Insufficient documentation

## 2012-10-09 DIAGNOSIS — S069X9A Unspecified intracranial injury with loss of consciousness of unspecified duration, initial encounter: Secondary | ICD-10-CM

## 2012-10-09 DIAGNOSIS — S020XXA Fracture of vault of skull, initial encounter for closed fracture: Secondary | ICD-10-CM | POA: Insufficient documentation

## 2012-10-09 DIAGNOSIS — G5730 Lesion of lateral popliteal nerve, unspecified lower limb: Secondary | ICD-10-CM | POA: Insufficient documentation

## 2012-10-09 DIAGNOSIS — S82009A Unspecified fracture of unspecified patella, initial encounter for closed fracture: Secondary | ICD-10-CM | POA: Insufficient documentation

## 2012-10-09 DIAGNOSIS — S7292XB Unspecified fracture of left femur, initial encounter for open fracture type I or II: Secondary | ICD-10-CM

## 2012-10-09 MED ORDER — GLUCOSE BLOOD VI STRP: ORAL_STRIP | Status: DC

## 2012-10-09 MED ORDER — OXYCODONE HCL 10 MG PO TABS: 10.0000 mg | ORAL_TABLET | Freq: Four times a day (QID) | ORAL | Status: DC | PRN

## 2012-10-09 MED ORDER — GABAPENTIN 600 MG PO TABS: 600.0000 mg | ORAL_TABLET | Freq: Three times a day (TID) | ORAL | Status: DC

## 2012-10-09 NOTE — Progress Notes (Signed)
Subjective:    Patient ID: Miguel Brady, male    DOB: November 20, 1960, 52 y.o.   MRN: [id number]  HPI  Miguel Brady is back regarding his TBI and polytrauma. He has continued to work with outpt PT on gait, ROM, pain. Pain continues to be a limiting factor. The ultram is giving him no relief with his pain. The neurontin has helped somewhat with the tingling in his left foot. He is walking with a cane currently. He has walked somewhat without, but he finds it much more difficult.  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 He was able to walk up and into my office much easier today. In fact, he asked if we had moved closer to the door!  He sees Miguel Brady in follow up next week. He notes a "knot" on his anterior left thigh and another one along his left buttock which are tender.  \Pain Inventory Average Pain 7 Pain Right Now 7 My pain is sharp, tingling and aching  In the last 24 hours, has pain interfered with the following? General activity 8 Relation with others 8 Enjoyment of life 8 What TIME of day is your pain at its worst? all the time Sleep (in general) Poor  Pain is worse with: walking, bending, sitting and standing Pain improves with: medication Relief from Meds: 5  Mobility use a cane how many minutes can you walk? 10 ability to climb steps?  yes do you drive?  no Do you have any goals in this area?  yes  Function not employed: date last employed 03/20/12  Neuro/Psych weakness numbness trouble walking spasms  Prior Studies Any changes since last visit?  no  Physicians involved in your care Any changes since last visit?  no   History reviewed. No pertinent family history. History   Social History  . Marital Status: Single    Spouse Name: N/A    Number of Children: N/A  . Years of Education: N/A   Social History Main Topics  . Smoking status: Former Smoker -- 0.5 packs/day for 30 years    Types:  Cigarettes    Quit date: 04/02/2012  . Smokeless tobacco: Never Used  . Alcohol Use: No     Comment: OCCASIONAL  . Drug Use: No  . Sexually Active: None   Other Topics Concern  . None   Social History Narrative   ** Merged History Encounter **    Past Surgical History  Procedure Date  . External fixation pelvis 04/03/2012    Procedure: EXTERNAL FIXATION PELVIS;  Surgeon: Miguel Palmer, MD;  Location: [facility] OR;  Service: Orthopedics;;  . External fixation leg 04/03/2012    Procedure: EXTERNAL FIXATION LEG;  Surgeon: Miguel Palmer, MD;  Location: [facility] OR;  Service: Orthopedics;  Laterality: Left;  Left femur  . Chest tube insertion 04/03/2012    Procedure: CHEST TUBE INSERTION;  Surgeon: Miguel Malady, MD;  Location: [facility] OR;  Service: General;  Laterality: Left;  . Incision and drainage of wound 04/03/2012    Procedure: IRRIGATION AND DEBRIDEMENT WOUND;  Surgeon: Miguel Fake, MD;  Location: [facility] OR;  Service: Neurosurgery;  Laterality: N/A;  Frontal.  . Tibia im nail insertion 04/07/2012    Procedure: INTRAMEDULLARY (IM) NAIL TIBIAL;  Surgeon: Miguel Palmer, MD;  Location: [facility] OR;  Service: Orthopedics;  Laterality: Left;  . Femur im nail 04/07/2012    Procedure: INTRAMEDULLARY (  IM) NAIL FEMORAL;  Surgeon: Miguel Palmer, MD;  Location: [facility] OR;  Service: Orthopedics;  Laterality: Left;  . Orif patella 04/07/2012    Procedure: OPEN REDUCTION INTERNAL (ORIF) FIXATION PATELLA;  Surgeon: Miguel Palmer, MD;  Location: [facility] OR;  Service: Orthopedics;  Laterality: Left;  . Orif pelvic fracture 04/07/2012    Procedure: OPEN REDUCTION INTERNAL FIXATION (ORIF) PELVIC FRACTURE;  Surgeon: Miguel Palmer, MD;  Location: [facility] OR;  Service: Orthopedics;  Laterality: N/A;  Right and left sacroiliac screw pinning,Irrigation and debridebridement open tibia and femur,removal external fixator.  . Flexible bronchoscopy 04/07/2012    Procedure: FLEXIBLE BRONCHOSCOPY;  Surgeon: Miguel Malady, MD;  Location: [facility]  OR;  Service: General;;  START TIME=1645 END TIME=1700  . Cholecystectomy 07/28/2012    Procedure: LAPAROSCOPIC CHOLECYSTECTOMY;  Surgeon: Miguel Rubenstein, MD;  Location: [facility] ORS;  Service: General;  Laterality: N/A;   Past Medical History  Diagnosis Date  . Obesity   . Hypertension   . Diabetes mellitus   . Headache   . TOBACCO ABUSE 12/23/2008  . 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 GERD 05/05/2007  . TRIGGER FINGER 05/05/2007   BP 132/73  Pulse 93  Resp 14  Ht 6\' 2"  (1.88 m)  Wt 253 lb (114.76 kg)  BMI 32.48 kg/m2  SpO2 99%    Review of Systems  Musculoskeletal: Positive for back pain and gait problem.  Neurological: Positive for weakness and numbness.       Spasms   All other systems reviewed and are negative.       Objective:   Physical Exam  General: Alert and oriented x 3, No apparent distress  HEENT: Head is normocephalic, atraumatic, PERRLA, EOMI, sclera anicteric, oral mucosa pink and moist, dentition intact, ext ear canals clear,  Neck: Supple without JVD or lymphadenopathy  Heart: Reg rate and rhythm. No murmurs rubs or gallops  Chest: CTA bilaterally without wheezes, rales, or rhonchi; no distress  Abdomen: Soft, non-tender, non-distended, bowel sounds positive.  Extremities: No clubbing, cyanosis, or edema. Pulses are 2+  Skin: he has multiple scars over the left leg. There is mild breakdown over the patella. He also has a heel ulcer which is dressed.  Neuro: Pt is alert. Oriented to date, place, reason. Conversationally he's appropriate. He's occasionally a little impulsive but this is decreased. He has mild STM deficits which seem to be improving.. UE strength grossly 5/5. RLE is grossly 4//5. LLE is 2 to 2+/5 with HF and KE. He has 3.5 APF and 2/5 ADR. He has limited left KE/KF due to pain. He has decreased sensory to LT over the anterior/lateral leg and dorsum of the foot and likely the sole as well. There is also decreased LT over the right foot in a  stocking glove distribution  Musculoskeletal: Full ROM, No pain with AROM or PROM in the neck, trunk. He has callus over the medial anterior femur. There is a small lump which appears to be a scar over the lateral gluteal. Posture appropriate  Psych: Pt's affect is appropriate. Pt is cooperative  Assessment & Plan:   Assessment:  1. Traumatic brain injury with polytrauma. Overall he continues to make progress. 2. 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  3. Left femur fracture treated with intramedullary nailing.  4. Left tibia fracture treated with intramedullary nailing.  5. Left patella fracture and infrapatellar tendon injury, treated with open  reduction and internal fixation of patella.  6. Right peroneal nerve(or sciatic branch) injury  7. Diabetic PN?   Plan:  1. Added oxycodone 10mg  one q6prn #90. i expect him to use only 3 per day on avg 2. Continue with therapy to completion. He has made strides with his gait and stamina.  3. Discussed realistic approach to his recovery. He needs to continue working on improved strength and balance first. He needs to be more confident in weight bearing through the left. i realize that pain is a big part of that as well. He will follow up with Miguel Brady next week also. 4. Increase neurontin 600 mg tid for peroneal nerve pain and ? DPN.  5. I will see the patient back in about 2 months. 15 minutes of face to face patient care time were spent during this visit. All questions were encouraged and answered.  6. Discussed bp and glycemic control as well today. I wrote him an rx for test strips today.

## 2012-10-09 NOTE — Patient Instructions (Signed)
GABAPENTIN: DAY 1-3: 300-300-600MG  DAY 4-6 600-300-600MG  DAY 7+ 600-600-600

## 2012-10-12 ENCOUNTER — Ambulatory Visit: Payer: Medicaid Other | Admitting: Rehabilitation

## 2012-10-13 ENCOUNTER — Ambulatory Visit: Payer: Medicaid Other | Admitting: Rehabilitation

## 2012-10-16 ENCOUNTER — Ambulatory Visit: Payer: Medicaid Other

## 2012-10-19 ENCOUNTER — Ambulatory Visit: Payer: Medicaid Other | Attending: Physical Medicine & Rehabilitation | Admitting: Rehabilitation

## 2012-10-19 DIAGNOSIS — R5381 Other malaise: Secondary | ICD-10-CM | POA: Insufficient documentation

## 2012-10-19 DIAGNOSIS — R262 Difficulty in walking, not elsewhere classified: Secondary | ICD-10-CM | POA: Insufficient documentation

## 2012-10-19 DIAGNOSIS — M2560 Stiffness of unspecified joint, not elsewhere classified: Secondary | ICD-10-CM | POA: Insufficient documentation

## 2012-10-19 DIAGNOSIS — IMO0001 Reserved for inherently not codable concepts without codable children: Secondary | ICD-10-CM | POA: Insufficient documentation

## 2012-10-19 DIAGNOSIS — M2550 Pain in unspecified joint: Secondary | ICD-10-CM | POA: Insufficient documentation

## 2012-10-20 ENCOUNTER — Ambulatory Visit: Payer: Medicaid Other | Attending: Physical Medicine & Rehabilitation | Admitting: Rehabilitation

## 2012-10-20 DIAGNOSIS — R5381 Other malaise: Secondary | ICD-10-CM | POA: Insufficient documentation

## 2012-10-20 DIAGNOSIS — M2550 Pain in unspecified joint: Secondary | ICD-10-CM | POA: Insufficient documentation

## 2012-10-20 DIAGNOSIS — R262 Difficulty in walking, not elsewhere classified: Secondary | ICD-10-CM | POA: Insufficient documentation

## 2012-10-20 DIAGNOSIS — IMO0001 Reserved for inherently not codable concepts without codable children: Secondary | ICD-10-CM | POA: Insufficient documentation

## 2012-10-20 DIAGNOSIS — M2560 Stiffness of unspecified joint, not elsewhere classified: Secondary | ICD-10-CM | POA: Insufficient documentation

## 2012-10-26 ENCOUNTER — Ambulatory Visit: Payer: Medicaid Other | Attending: Physical Medicine & Rehabilitation | Admitting: Rehabilitation

## 2012-10-26 DIAGNOSIS — R5381 Other malaise: Secondary | ICD-10-CM | POA: Insufficient documentation

## 2012-10-26 DIAGNOSIS — M2560 Stiffness of unspecified joint, not elsewhere classified: Secondary | ICD-10-CM | POA: Insufficient documentation

## 2012-10-26 DIAGNOSIS — R262 Difficulty in walking, not elsewhere classified: Secondary | ICD-10-CM | POA: Insufficient documentation

## 2012-10-26 DIAGNOSIS — M2550 Pain in unspecified joint: Secondary | ICD-10-CM | POA: Insufficient documentation

## 2012-10-26 DIAGNOSIS — IMO0001 Reserved for inherently not codable concepts without codable children: Secondary | ICD-10-CM | POA: Insufficient documentation

## 2012-10-27 ENCOUNTER — Other Ambulatory Visit: Payer: Self-pay | Admitting: Orthopedic Surgery

## 2012-10-27 DIAGNOSIS — M25559 Pain in unspecified hip: Secondary | ICD-10-CM

## 2012-10-28 ENCOUNTER — Telehealth: Payer: Self-pay

## 2012-10-28 ENCOUNTER — Ambulatory Visit: Payer: Medicaid Other | Admitting: Rehabilitation

## 2012-10-28 NOTE — Telephone Encounter (Signed)
Patient called to see if he could get a script for an acu-check meter.  He says we filled the strips but he does not have a meter.

## 2012-11-02 ENCOUNTER — Ambulatory Visit: Payer: Medicaid Other | Admitting: Rehabilitation

## 2012-11-02 NOTE — Telephone Encounter (Signed)
Left message advising patient to contact primary care giver to get his acu-check meter.

## 2012-11-04 ENCOUNTER — Ambulatory Visit: Payer: Medicaid Other | Admitting: Rehabilitation

## 2012-11-06 ENCOUNTER — Ambulatory Visit
Admission: RE | Admit: 2012-11-06 | Discharge: 2012-11-06 | Disposition: A | Discharge status: Home / Self Care | Payer: Medicaid Other | Source: Ambulatory Visit | Attending: Orthopedic Surgery | Admitting: Orthopedic Surgery

## 2012-11-06 ENCOUNTER — Other Ambulatory Visit: Payer: Self-pay | Admitting: Orthopedic Surgery

## 2012-11-06 DIAGNOSIS — M25559 Pain in unspecified hip: Secondary | ICD-10-CM

## 2012-11-09 ENCOUNTER — Ambulatory Visit: Payer: Medicaid Other | Admitting: Rehabilitation

## 2012-11-11 ENCOUNTER — Ambulatory Visit: Payer: Medicaid Other | Admitting: Rehabilitation

## 2012-11-16 ENCOUNTER — Ambulatory Visit: Payer: Medicaid Other

## 2012-11-21 ENCOUNTER — Other Ambulatory Visit: Payer: Self-pay | Admitting: Physical Medicine & Rehabilitation

## 2012-11-23 ENCOUNTER — Ambulatory Visit: Payer: Medicaid Other | Attending: Physical Medicine & Rehabilitation

## 2012-11-23 DIAGNOSIS — IMO0001 Reserved for inherently not codable concepts without codable children: Secondary | ICD-10-CM | POA: Insufficient documentation

## 2012-11-23 DIAGNOSIS — R262 Difficulty in walking, not elsewhere classified: Secondary | ICD-10-CM | POA: Insufficient documentation

## 2012-11-23 DIAGNOSIS — M2550 Pain in unspecified joint: Secondary | ICD-10-CM | POA: Insufficient documentation

## 2012-11-23 DIAGNOSIS — M2560 Stiffness of unspecified joint, not elsewhere classified: Secondary | ICD-10-CM | POA: Insufficient documentation

## 2012-11-23 DIAGNOSIS — R5381 Other malaise: Secondary | ICD-10-CM | POA: Insufficient documentation

## 2012-11-26 ENCOUNTER — Ambulatory Visit: Payer: Medicaid Other | Admitting: Rehabilitation

## 2012-11-30 ENCOUNTER — Ambulatory Visit: Payer: Medicaid Other | Admitting: Rehabilitation

## 2012-12-02 ENCOUNTER — Ambulatory Visit: Payer: Medicaid Other | Admitting: Rehabilitation

## 2012-12-04 ENCOUNTER — Other Ambulatory Visit: Payer: Self-pay | Admitting: *Deleted

## 2012-12-04 ENCOUNTER — Encounter: Payer: Medicaid Other | Attending: Physical Medicine & Rehabilitation | Admitting: Physical Medicine & Rehabilitation

## 2012-12-04 ENCOUNTER — Encounter: Payer: Self-pay | Admitting: Physical Medicine & Rehabilitation

## 2012-12-04 VITALS — BP 149/78 | HR 82 | Resp 14 | Ht 74.0 in | Wt 271.2 lb

## 2012-12-04 DIAGNOSIS — Z79899 Other long term (current) drug therapy: Secondary | ICD-10-CM | POA: Insufficient documentation

## 2012-12-04 DIAGNOSIS — K812 Acute cholecystitis with chronic cholecystitis: Secondary | ICD-10-CM | POA: Insufficient documentation

## 2012-12-04 DIAGNOSIS — S82002D Unspecified fracture of left patella, subsequent encounter for closed fracture with routine healing: Secondary | ICD-10-CM

## 2012-12-04 DIAGNOSIS — Z5181 Encounter for therapeutic drug level monitoring: Secondary | ICD-10-CM

## 2012-12-04 DIAGNOSIS — G5732 Lesion of lateral popliteal nerve, left lower limb: Secondary | ICD-10-CM

## 2012-12-04 DIAGNOSIS — S020XXD Fracture of vault of skull, subsequent encounter for fracture with routine healing: Secondary | ICD-10-CM

## 2012-12-04 DIAGNOSIS — S7292XE Unspecified fracture of left femur, subsequent encounter for open fracture type I or II with routine healing: Secondary | ICD-10-CM

## 2012-12-04 DIAGNOSIS — X58XXXA Exposure to other specified factors, initial encounter: Secondary | ICD-10-CM | POA: Insufficient documentation

## 2012-12-04 DIAGNOSIS — S329XXD Fracture of unspecified parts of lumbosacral spine and pelvis, subsequent encounter for fracture with routine healing: Secondary | ICD-10-CM

## 2012-12-04 DIAGNOSIS — S7290XD Unspecified fracture of unspecified femur, subsequent encounter for closed fracture with routine healing: Secondary | ICD-10-CM

## 2012-12-04 DIAGNOSIS — G894 Chronic pain syndrome: Secondary | ICD-10-CM | POA: Insufficient documentation

## 2012-12-04 DIAGNOSIS — E119 Type 2 diabetes mellitus without complications: Secondary | ICD-10-CM

## 2012-12-04 DIAGNOSIS — IMO0001 Reserved for inherently not codable concepts without codable children: Secondary | ICD-10-CM | POA: Insufficient documentation

## 2012-12-04 DIAGNOSIS — G5730 Lesion of lateral popliteal nerve, unspecified lower limb: Secondary | ICD-10-CM

## 2012-12-04 DIAGNOSIS — S069X0D Unspecified intracranial injury without loss of consciousness, subsequent encounter: Secondary | ICD-10-CM

## 2012-12-04 DIAGNOSIS — S0990XA Unspecified injury of head, initial encounter: Secondary | ICD-10-CM | POA: Insufficient documentation

## 2012-12-04 DIAGNOSIS — S8290XD Unspecified fracture of unspecified lower leg, subsequent encounter for closed fracture with routine healing: Secondary | ICD-10-CM | POA: Insufficient documentation

## 2012-12-04 DIAGNOSIS — T07XXXA Unspecified multiple injuries, initial encounter: Secondary | ICD-10-CM

## 2012-12-04 DIAGNOSIS — Z5189 Encounter for other specified aftercare: Secondary | ICD-10-CM | POA: Insufficient documentation

## 2012-12-04 DIAGNOSIS — T148XXA Other injury of unspecified body region, initial encounter: Secondary | ICD-10-CM | POA: Insufficient documentation

## 2012-12-04 MED ORDER — OXYCODONE HCL 10 MG PO TABS: 10.0000 mg | ORAL_TABLET | Freq: Four times a day (QID) | ORAL | Status: DC | PRN

## 2012-12-04 NOTE — Patient Instructions (Signed)
WORK ON BASIC EXERCISE TO IMPROVE YOUR STAMINA AND STRENGTH.   USE GOOD POSTURE  CHECK OUT THE [employer] TO SEE IF YOU QUALIFY FOR FINANCIAL ASSISTANCE
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
## 2012-12-04 NOTE — Telephone Encounter (Signed)
Re printed rx for Miguel Brady to sign because it was not given to him at his visit with Dr Riley Kill.

## 2012-12-22 ENCOUNTER — Other Ambulatory Visit: Payer: Self-pay | Admitting: Physical Medicine & Rehabilitation

## 2012-12-30 ENCOUNTER — Encounter: Payer: Self-pay | Admitting: Physical Medicine and Rehabilitation

## 2012-12-30 ENCOUNTER — Encounter: Payer: Medicaid Other | Attending: Physical Medicine & Rehabilitation | Admitting: Physical Medicine and Rehabilitation

## 2012-12-30 VITALS — BP 121/59 | HR 86 | Resp 16 | Ht 74.0 in | Wt 271.4 lb

## 2012-12-30 DIAGNOSIS — G5730 Lesion of lateral popliteal nerve, unspecified lower limb: Secondary | ICD-10-CM

## 2012-12-30 DIAGNOSIS — K812 Acute cholecystitis with chronic cholecystitis: Secondary | ICD-10-CM | POA: Insufficient documentation

## 2012-12-30 DIAGNOSIS — S7292XE Unspecified fracture of left femur, subsequent encounter for open fracture type I or II with routine healing: Secondary | ICD-10-CM

## 2012-12-30 DIAGNOSIS — S329XXD Fracture of unspecified parts of lumbosacral spine and pelvis, subsequent encounter for fracture with routine healing: Secondary | ICD-10-CM

## 2012-12-30 DIAGNOSIS — Z5189 Encounter for other specified aftercare: Secondary | ICD-10-CM | POA: Insufficient documentation

## 2012-12-30 DIAGNOSIS — T148XXA Other injury of unspecified body region, initial encounter: Secondary | ICD-10-CM

## 2012-12-30 DIAGNOSIS — S82002D Unspecified fracture of left patella, subsequent encounter for closed fracture with routine healing: Secondary | ICD-10-CM

## 2012-12-30 DIAGNOSIS — S020XXD Fracture of vault of skull, subsequent encounter for fracture with routine healing: Secondary | ICD-10-CM

## 2012-12-30 DIAGNOSIS — S069X0D Unspecified intracranial injury without loss of consciousness, subsequent encounter: Secondary | ICD-10-CM

## 2012-12-30 DIAGNOSIS — S7290XD Unspecified fracture of unspecified femur, subsequent encounter for closed fracture with routine healing: Secondary | ICD-10-CM | POA: Insufficient documentation

## 2012-12-30 DIAGNOSIS — T07XXXA Unspecified multiple injuries, initial encounter: Secondary | ICD-10-CM

## 2012-12-30 DIAGNOSIS — G8921 Chronic pain due to trauma: Secondary | ICD-10-CM

## 2012-12-30 DIAGNOSIS — G5732 Lesion of lateral popliteal nerve, left lower limb: Secondary | ICD-10-CM

## 2012-12-30 DIAGNOSIS — S8290XD Unspecified fracture of unspecified lower leg, subsequent encounter for closed fracture with routine healing: Secondary | ICD-10-CM | POA: Insufficient documentation

## 2012-12-30 DIAGNOSIS — IMO0001 Reserved for inherently not codable concepts without codable children: Secondary | ICD-10-CM | POA: Insufficient documentation

## 2012-12-30 DIAGNOSIS — X58XXXA Exposure to other specified factors, initial encounter: Secondary | ICD-10-CM | POA: Insufficient documentation

## 2012-12-30 MED ORDER — OXYCODONE HCL 10 MG PO TABS: 10.0000 mg | ORAL_TABLET | Freq: Four times a day (QID) | ORAL | Status: DC | PRN

## 2012-12-30 NOTE — Progress Notes (Signed)
Subjective:    Patient ID: Miguel Brady, male    DOB: Dec 01, 1960, 52 y.o.   MRN: [id number]  HPI Mr. Nestle is back regarding his polytrauma and TBI, July 2013. He completed PT . The increase of his gabapentin has helped with his radiating symptoms.  He is using oxycodone up to three x per day. He is walking with his cane.  He still is having pain in his left leg which can bother him throughout the day. It is usually tingling and aching. He reports, that he has looked into [facility] in a [facility], but he is a little overwhelmed with the paperwork he has to do to get some financial help. He states, that he will do this eventually.  Pain Inventory Average Pain 6 Pain Right Now 6 My pain is tingling and aching  In the last 24 hours, has pain interfered with the following? General activity 6 Relation with others 0 Enjoyment of life 6 What TIME of day is your pain at its worst? morning,day,evening,night time Sleep (in general) n/a  Pain is worse with: walking, bending, sitting and standing Pain improves with: rest and medication Relief from Meds: 6  Mobility use a cane how many minutes can you walk? 15-20 ability to climb steps?  yes do you drive?  no Do you have any goals in this area?  yes  Function Do you have any goals in this area?  no  Neuro/Psych tingling spasms  Prior Studies Any changes since last visit?  no  Physicians involved in your care Any changes since last visit?  no   History reviewed. No pertinent family history. History   Social History  . Marital Status: Single    Spouse Name: N/A    Number of Children: N/A  . Years of Education: N/A   Social History Main Topics  . Smoking status: Former Smoker -- 0.50 packs/day for 30 years    Types: Cigarettes    Quit date: 04/02/2012  . Smokeless tobacco: Never Used  . Alcohol Use: No     Comment: OCCASIONAL  . Drug Use: No  . Sexually Active: None   Other Topics Concern  . None   Social  History Narrative   ** Merged History Encounter **       Past Surgical History  Procedure Laterality Date  . External fixation pelvis  04/03/2012    Procedure: EXTERNAL FIXATION PELVIS;  Surgeon: Budd Palmer, MD;  Location: [facility] OR;  Service: Orthopedics;;  . External fixation leg  04/03/2012    Procedure: EXTERNAL FIXATION LEG;  Surgeon: Budd Palmer, MD;  Location: [facility] OR;  Service: Orthopedics;  Laterality: Left;  Left femur  . Chest tube insertion  04/03/2012    Procedure: CHEST TUBE INSERTION;  Surgeon: Liz Malady, MD;  Location: [facility] OR;  Service: General;  Laterality: Left;  . Incision and drainage of wound  04/03/2012    Procedure: IRRIGATION AND DEBRIDEMENT WOUND;  Surgeon: Clydene Fake, MD;  Location: [facility] OR;  Service: Neurosurgery;  Laterality: N/A;  Frontal.  . Tibia im nail insertion  04/07/2012    Procedure: INTRAMEDULLARY (IM) NAIL TIBIAL;  Surgeon: Budd Palmer, MD;  Location: [facility] OR;  Service: Orthopedics;  Laterality: Left;  . Femur im nail  04/07/2012    Procedure: INTRAMEDULLARY (IM) NAIL FEMORAL;  Surgeon: Budd Palmer, MD;  Location: [facility] OR;  Service: Orthopedics;  Laterality: Left;  . Orif patella  04/07/2012    Procedure: OPEN  REDUCTION INTERNAL (ORIF) FIXATION PATELLA;  Surgeon: Budd Palmer, MD;  Location: [facility] OR;  Service: Orthopedics;  Laterality: Left;  . Orif pelvic fracture  04/07/2012    Procedure: OPEN REDUCTION INTERNAL FIXATION (ORIF) PELVIC FRACTURE;  Surgeon: Budd Palmer, MD;  Location: [facility] OR;  Service: Orthopedics;  Laterality: N/A;  Right and left sacroiliac screw pinning,Irrigation and debridebridement open tibia and femur,removal external fixator.  . Flexible bronchoscopy  04/07/2012    Procedure: FLEXIBLE BRONCHOSCOPY;  Surgeon: Liz Malady, MD;  Location: [facility] OR;  Service: General;;  START TIME=1645 END TIME=1700  . Cholecystectomy  07/28/2012    Procedure: LAPAROSCOPIC CHOLECYSTECTOMY;  Surgeon: Shelly Rubenstein, MD;  Location: [facility]  ORS;  Service: General;  Laterality: N/A;   Past Medical History  Diagnosis Date  . Obesity   . Hypertension   . Diabetes mellitus   . Headache   . TOBACCO ABUSE 12/23/2008  . 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 GERD 05/05/2007  . TRIGGER FINGER 05/05/2007   BP 121/59  Pulse 86  Resp 16  Ht 6\' 2"  (1.88 m)  Wt 271 lb 6.4 oz (123.106 kg)  BMI 34.83 kg/m2  SpO2 96%      Review of Systems  Neurological:       Spasms, tingling   All other systems reviewed and are negative.       Objective:   Physical Exam General: Alert and oriented x 3, No apparent distress  HEENT: Head is normocephalic Neck: Supple   Chest:  no distress   Extremities: No clubbing, cyanosis, or edema. Pulses are 2+    Neuro: Pt is alert. Oriented to date, place, reason. Conversationally he's appropriate.  Musculoskeletal:  UE strength grossly 5/5. RLE is grossly 5/5.  LLE  Quad 4/5, ischiocrurale 4-/5, peroneus 3+/5, tibialis ant. 4-/5 .  He has decreased sensory to LT over the anterior/lateral leg and dorsum of the foot and likely the sole as well. There is also decreased LT over the right foot in a stocking glove distribution   Full ROM, No pain with AROM or PROM in the neck, trunk.  Posture appropriate  Psych: Pt's affect is appropriate. Pt is cooperative         Assessment & Plan:  1. Traumatic brain injury with polytrauma, July 2013. Overall he continues to make progress.  2. 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  3. Left femur fracture treated with intramedullary nailing.  4. Left tibia fracture treated with intramedullary nailing.  5. Left patella fracture and infrapatellar tendon injury, treated with open  reduction and internal fixation of patella.  6. Right peroneal nerve(or sciatic branch) injury  7. Diabetic PN?  Plan:  1.  Continue with oxycodone 10mg  one q6prn #90. (3 per day on avg)  2. Recommended aquatic exercises in a  [employer] for maintenance program. He has contacted the [facility] regarding financial assistance, and will be working on the papers they require.  3.  Continue neurontin 600 mg tid for peroneal nerve pain and ? DPN.  5.  patient back in about 1 months. 25 minutes of face to face patient care time were spent during this visit. All questions were encouraged and answered.

## 2012-12-30 NOTE — Patient Instructions (Addendum)
Try to start working on filling out the necessary paper work to start  aquatic exercises in a [employer]. Continue with exercises you learned from PT. You can do foot massage with a massage or tennis ball, and massage with a frozen water bottle to help with your foot pain/ tingling/ burning sensation.

## 2013-01-13 ENCOUNTER — Telehealth: Payer: Self-pay | Admitting: *Deleted

## 2013-01-13 NOTE — Telephone Encounter (Signed)
Notified Miguel Brady that Dr Riley Kill wants to continue to treat him for his head injury but that he will not prescribe narcotics for him any longer. He agrees to these terms.

## 2013-01-27 ENCOUNTER — Encounter: Payer: Medicaid Other | Attending: Physical Medicine & Rehabilitation | Admitting: Physical Medicine & Rehabilitation

## 2013-01-27 ENCOUNTER — Encounter: Payer: Self-pay | Admitting: Physical Medicine & Rehabilitation

## 2013-01-27 VITALS — BP 116/67 | HR 68 | Resp 14 | Ht 74.0 in | Wt 273.0 lb

## 2013-01-27 DIAGNOSIS — Z5189 Encounter for other specified aftercare: Secondary | ICD-10-CM

## 2013-01-27 DIAGNOSIS — S069X0D Unspecified intracranial injury without loss of consciousness, subsequent encounter: Secondary | ICD-10-CM

## 2013-01-27 DIAGNOSIS — G5730 Lesion of lateral popliteal nerve, unspecified lower limb: Secondary | ICD-10-CM

## 2013-01-27 DIAGNOSIS — S82002D Unspecified fracture of left patella, subsequent encounter for closed fracture with routine healing: Secondary | ICD-10-CM

## 2013-01-27 DIAGNOSIS — S7290XD Unspecified fracture of unspecified femur, subsequent encounter for closed fracture with routine healing: Secondary | ICD-10-CM

## 2013-01-27 DIAGNOSIS — S8290XD Unspecified fracture of unspecified lower leg, subsequent encounter for closed fracture with routine healing: Secondary | ICD-10-CM

## 2013-01-27 DIAGNOSIS — S329XXD Fracture of unspecified parts of lumbosacral spine and pelvis, subsequent encounter for fracture with routine healing: Secondary | ICD-10-CM

## 2013-01-27 DIAGNOSIS — S7292XE Unspecified fracture of left femur, subsequent encounter for open fracture type I or II with routine healing: Secondary | ICD-10-CM

## 2013-01-27 DIAGNOSIS — E119 Type 2 diabetes mellitus without complications: Secondary | ICD-10-CM

## 2013-01-27 DIAGNOSIS — X58XXXA Exposure to other specified factors, initial encounter: Secondary | ICD-10-CM | POA: Insufficient documentation

## 2013-01-27 DIAGNOSIS — S020XXD Fracture of vault of skull, subsequent encounter for fracture with routine healing: Secondary | ICD-10-CM

## 2013-01-27 DIAGNOSIS — E669 Obesity, unspecified: Secondary | ICD-10-CM

## 2013-01-27 DIAGNOSIS — S7292XH Unspecified fracture of left femur, subsequent encounter for open fracture type I or II with delayed healing: Secondary | ICD-10-CM

## 2013-01-27 DIAGNOSIS — K812 Acute cholecystitis with chronic cholecystitis: Secondary | ICD-10-CM

## 2013-01-27 DIAGNOSIS — G5732 Lesion of lateral popliteal nerve, left lower limb: Secondary | ICD-10-CM

## 2013-01-27 DIAGNOSIS — IMO0001 Reserved for inherently not codable concepts without codable children: Secondary | ICD-10-CM

## 2013-01-27 MED ORDER — OXYCODONE HCL 10 MG PO TABS: 10.0000 mg | ORAL_TABLET | Freq: Four times a day (QID) | ORAL | Status: DC | PRN

## 2013-01-27 MED ORDER — GABAPENTIN 800 MG PO TABS: 800.0000 mg | ORAL_TABLET | Freq: Three times a day (TID) | ORAL | Status: DC

## 2013-01-27 NOTE — Progress Notes (Signed)
Subjective:    Patient ID: Miguel Brady, male    DOB: [birth year], 52 y.o.   MRN: [id number]  HPI  Miguel Brady is back regarding his TBI. He reports that he tripped on a vacuum cord at home about 3 weeks ago. He fell on a dog fence, twisted his left knee, and bumped his head. He didn't go to the ED and is here in follow up of those injuries as well.  He went to the [facility] clinic by referral and doesn't wish to return. He prefers that I rx his medications and is willing to go by a CSA.   Pain Inventory Average Pain 7 Pain Right Now 7 My pain is constant  In the last 24 hours, has pain interfered with the following? General activity 7 Relation with others 6 Enjoyment of life 7 What TIME of day is your pain at its worst? all the time Sleep (in general) Fair  Pain is worse with: walking, bending, sitting, standing and some activites Pain improves with: rest, therapy/exercise and medication Relief from Meds: 4  Mobility walk with assistance use a cane use a walker how many minutes can you walk? 35 ability to climb steps?  yes do you drive?  no  Function disabled: date disabled 7/13 I need assistance with the following:  household duties  Neuro/Psych numbness trouble walking  Prior Studies Any changes since last visit?  no  Physicians involved in your care Any changes since last visit?  no   No family history on file. History   Social History  . Marital Status: Single    Spouse Name: N/A    Number of Children: N/A  . Years of Education: N/A   Social History Main Topics  . Smoking status: Former Smoker -- 0.50 packs/day for 30 years    Types: Cigarettes    Quit date: 04/02/2012  . Smokeless tobacco: Never Used  . Alcohol Use: No     Comment: OCCASIONAL  . Drug Use: No  . Sexually Active: None   Other Topics Concern  . None   Social History Narrative   ** Merged History Encounter **       Past Surgical History  Procedure Laterality Date  .  External fixation pelvis  04/03/2012    Procedure: EXTERNAL FIXATION PELVIS;  Surgeon: Budd Palmer, MD;  Location: [facility] OR;  Service: Orthopedics;;  . External fixation leg  04/03/2012    Procedure: EXTERNAL FIXATION LEG;  Surgeon: Budd Palmer, MD;  Location: [facility] OR;  Service: Orthopedics;  Laterality: Left;  Left femur  . Chest tube insertion  04/03/2012    Procedure: CHEST TUBE INSERTION;  Surgeon: Liz Malady, MD;  Location: [facility] OR;  Service: General;  Laterality: Left;  . Incision and drainage of wound  04/03/2012    Procedure: IRRIGATION AND DEBRIDEMENT WOUND;  Surgeon: Clydene Fake, MD;  Location: [facility] OR;  Service: Neurosurgery;  Laterality: N/A;  Frontal.  . Tibia im nail insertion  04/07/2012    Procedure: INTRAMEDULLARY (IM) NAIL TIBIAL;  Surgeon: Budd Palmer, MD;  Location: [facility] OR;  Service: Orthopedics;  Laterality: Left;  . Femur im nail  04/07/2012    Procedure: INTRAMEDULLARY (IM) NAIL FEMORAL;  Surgeon: Budd Palmer, MD;  Location: [facility] OR;  Service: Orthopedics;  Laterality: Left;  . Orif patella  04/07/2012    Procedure: OPEN REDUCTION INTERNAL (ORIF) FIXATION PATELLA;  Surgeon: Budd Palmer, MD;  Location: [facility] OR;  Service: Orthopedics;  Laterality: Left;  . Orif pelvic fracture  04/07/2012    Procedure: OPEN REDUCTION INTERNAL FIXATION (ORIF) PELVIC FRACTURE;  Surgeon: Budd Palmer, MD;  Location: [facility] OR;  Service: Orthopedics;  Laterality: N/A;  Right and left sacroiliac screw pinning,Irrigation and debridebridement open tibia and femur,removal external fixator.  . Flexible bronchoscopy  04/07/2012    Procedure: FLEXIBLE BRONCHOSCOPY;  Surgeon: Liz Malady, MD;  Location: [facility] OR;  Service: General;;  START TIME=1645 END TIME=1700  . Cholecystectomy  07/28/2012    Procedure: LAPAROSCOPIC CHOLECYSTECTOMY;  Surgeon: Shelly Rubenstein, MD;  Location: [facility] ORS;  Service: General;  Laterality: N/A;   Past Medical History  Diagnosis Date  . Obesity   . Hypertension    . Diabetes mellitus   . Headache   . TOBACCO ABUSE 12/23/2008  . 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 GERD 05/05/2007  . TRIGGER FINGER 05/05/2007   BP 116/67  Pulse 68  Resp 14  Ht 6\' 2"  (1.88 m)  Wt 273 lb (123.832 kg)  BMI 35.04 kg/m2  SpO2 100%     Review of Systems  Constitutional: Positive for unexpected weight change.  Musculoskeletal: Positive for gait problem.  Neurological: Positive for numbness.  All other systems reviewed and are negative.       Objective:   Physical Exam General: Alert and oriented x 3, No apparent distress  HEENT: Head is normocephalic, atraumatic, PERRLA, EOMI, sclera anicteric, oral mucosa pink and moist, dentition intact, ext ear canals clear,  Neck: Supple without JVD or lymphadenopathy  Heart: Reg rate and rhythm. No murmurs rubs or gallops  Chest: CTA bilaterally without wheezes, rales, or rhonchi; no distress  Abdomen: Soft, non-tender, non-distended, bowel sounds positive.  Extremities: No clubbing, cyanosis, or edema. Pulses are 2+  Skin: he has multiple scars over the left leg.  Neuro: Pt is alert. Oriented to date, place, reason. Conversationally he's appropriate. He's occasionally a little impulsive but this is decreased. He has mild STM deficits which seem to be improving.. UE strength grossly 5/5. RLE is grossly 4//5. LLE is 2 to 2+/5 with HF and KE. He has 3.5 APF and 2/5 ADR. He has limited left KE/KF due to pain. He has decreased sensory to LT over the anterior/lateral leg and dorsum of the foot and likely the sole as well. There is also decreased LT over the right foot in a stocking glove distribution  Musculoskeletal: Full ROM, No pain with AROM or PROM in the neck, trunk. He has callus over the medial anterior femur. There is a small lump which appears to be a scar over the lateral gluteal. Posture appropriate . LEFT femur is more tender to palpation. He displays increased antalgia LLE today. Left knee with small effusion but  stable. No instability.  Psych: Pt's affect is appropriate. Pt is cooperative   Assessment & Plan:   Assessment:  1. Traumatic brain injury with polytrauma. Overall he continues to make progress.  2. 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  3. Left femur fracture treated with intramedullary nailing.  4. Left tibia fracture treated with intramedullary nailing.  5. Left patella fracture and infrapatellar tendon injury, treated with open  reduction and internal fixation of patella.  6. Right peroneal nerve(or sciatic branch) injury  7. Diabetic PN?    Plan:  1. Continue with oxycodone 10mg  one  q6prn #90. (3 per day on avg). His CSA was reviewed. A no tolerance policy will be taken going forward---dc from clinic if CSA is broken. 2. I recommended an XR of the left femur. He wishes to see Dr. Carola Frost (his appt is next month)---I think this is too long a wait. Encouraged him to see him sooner if leg doesn't progress.  3. Reviewed appropriate dietary habits. A dietary referral was made.  4. Increase gabapentin to 800mg  tid for peroneal nerve pain and ? DPN.  5. My PA will see the patient back in about 1 months. 25 minutes of face to face patient care time were spent during this visit. All questions were encouraged and answered.

## 2013-01-27 NOTE — Patient Instructions (Addendum)
Be realistic about safety and activity at home. THINK ABOUT SAFETY FIRST  CONTACT DR HANDY REGARDING FOLLOW UP OF YOUR LEFT FEMUR/LEG IF YOU DON'T SEE IMPROVEMENT IN PAIN.

## 2013-02-24 ENCOUNTER — Encounter: Payer: Medicaid Other | Admitting: Physical Medicine and Rehabilitation

## 2013-03-05 ENCOUNTER — Encounter
Payer: Medicaid Other | Attending: Physical Medicine and Rehabilitation | Admitting: Physical Medicine and Rehabilitation

## 2013-03-05 ENCOUNTER — Encounter: Payer: Self-pay | Admitting: Physical Medicine and Rehabilitation

## 2013-03-05 VITALS — BP 118/64 | HR 95 | Resp 16 | Ht 74.0 in | Wt 285.0 lb

## 2013-03-05 DIAGNOSIS — T07XXXA Unspecified multiple injuries, initial encounter: Secondary | ICD-10-CM

## 2013-03-05 DIAGNOSIS — S020XXD Fracture of vault of skull, subsequent encounter for fracture with routine healing: Secondary | ICD-10-CM

## 2013-03-05 DIAGNOSIS — K812 Acute cholecystitis with chronic cholecystitis: Secondary | ICD-10-CM

## 2013-03-05 DIAGNOSIS — G8921 Chronic pain due to trauma: Secondary | ICD-10-CM

## 2013-03-05 DIAGNOSIS — S069X0D Unspecified intracranial injury without loss of consciousness, subsequent encounter: Secondary | ICD-10-CM

## 2013-03-05 DIAGNOSIS — I10 Essential (primary) hypertension: Secondary | ICD-10-CM | POA: Insufficient documentation

## 2013-03-05 DIAGNOSIS — Z5189 Encounter for other specified aftercare: Secondary | ICD-10-CM

## 2013-03-05 DIAGNOSIS — G5732 Lesion of lateral popliteal nerve, left lower limb: Secondary | ICD-10-CM

## 2013-03-05 DIAGNOSIS — IMO0001 Reserved for inherently not codable concepts without codable children: Secondary | ICD-10-CM

## 2013-03-05 DIAGNOSIS — R209 Unspecified disturbances of skin sensation: Secondary | ICD-10-CM | POA: Insufficient documentation

## 2013-03-05 DIAGNOSIS — G5730 Lesion of lateral popliteal nerve, unspecified lower limb: Secondary | ICD-10-CM

## 2013-03-05 DIAGNOSIS — X58XXXS Exposure to other specified factors, sequela: Secondary | ICD-10-CM | POA: Insufficient documentation

## 2013-03-05 DIAGNOSIS — S82002D Unspecified fracture of left patella, subsequent encounter for closed fracture with routine healing: Secondary | ICD-10-CM

## 2013-03-05 DIAGNOSIS — E669 Obesity, unspecified: Secondary | ICD-10-CM | POA: Insufficient documentation

## 2013-03-05 DIAGNOSIS — M79609 Pain in unspecified limb: Secondary | ICD-10-CM | POA: Insufficient documentation

## 2013-03-05 DIAGNOSIS — S069XAS Unspecified intracranial injury with loss of consciousness status unknown, sequela: Secondary | ICD-10-CM | POA: Insufficient documentation

## 2013-03-05 DIAGNOSIS — T148XXA Other injury of unspecified body region, initial encounter: Secondary | ICD-10-CM

## 2013-03-05 DIAGNOSIS — IMO0002 Reserved for concepts with insufficient information to code with codable children: Secondary | ICD-10-CM | POA: Insufficient documentation

## 2013-03-05 DIAGNOSIS — S329XXD Fracture of unspecified parts of lumbosacral spine and pelvis, subsequent encounter for fracture with routine healing: Secondary | ICD-10-CM

## 2013-03-05 DIAGNOSIS — S8290XD Unspecified fracture of unspecified lower leg, subsequent encounter for closed fracture with routine healing: Secondary | ICD-10-CM

## 2013-03-05 DIAGNOSIS — S346XXS Injury of peripheral nerve(s) at abdomen, lower back and pelvis level, sequela: Secondary | ICD-10-CM | POA: Insufficient documentation

## 2013-03-05 DIAGNOSIS — K219 Gastro-esophageal reflux disease without esophagitis: Secondary | ICD-10-CM | POA: Insufficient documentation

## 2013-03-05 DIAGNOSIS — S069X9S Unspecified intracranial injury with loss of consciousness of unspecified duration, sequela: Secondary | ICD-10-CM | POA: Insufficient documentation

## 2013-03-05 DIAGNOSIS — S7292XE Unspecified fracture of left femur, subsequent encounter for open fracture type I or II with routine healing: Secondary | ICD-10-CM

## 2013-03-05 DIAGNOSIS — S7290XD Unspecified fracture of unspecified femur, subsequent encounter for closed fracture with routine healing: Secondary | ICD-10-CM

## 2013-03-05 DIAGNOSIS — E119 Type 2 diabetes mellitus without complications: Secondary | ICD-10-CM | POA: Insufficient documentation

## 2013-03-05 DIAGNOSIS — S8490XS Injury of unspecified nerve at lower leg level, unspecified leg, sequela: Secondary | ICD-10-CM | POA: Insufficient documentation

## 2013-03-05 DIAGNOSIS — S8290XS Unspecified fracture of unspecified lower leg, sequela: Secondary | ICD-10-CM | POA: Insufficient documentation

## 2013-03-05 MED ORDER — OXYCODONE HCL 10 MG PO TABS: 10.0000 mg | ORAL_TABLET | Freq: Four times a day (QID) | ORAL | Status: DC | PRN

## 2013-03-05 NOTE — Progress Notes (Signed)
Subjective:    Patient ID: Miguel Brady, male    DOB: Sep 24, 1960, 52 y.o.   MRN: [id number]  HPI Miguel Brady is back regarding his polytrauma and TBI, July 2013. He completed PT . The increase of his gabapentin has helped with his radiating symptoms. He is using oxycodone up to three x per day. He is walking with his cane.  He still is having pain in his left leg which can bother him throughout the day. It is usually tingling and aching. He reports, that he has looked into [facility] exercises in a [employer], but he is a little overwhelmed with the paperwork he has to do to get some financial help. He states, that he will do this eventually. He still has not done this, he states, that he is waiting to get disability.  Pain Inventory Average Pain 7 Pain Right Now 7 My pain is sharp and aching  In the last 24 hours, has pain interfered with the following? General activity 7 Relation with others 3 Enjoyment of life 8 What TIME of day is your pain at its worst? constant Sleep (in general) Poor  Pain is worse with: walking, bending, sitting, inactivity and standing Pain improves with: rest Relief from Meds: 7  Mobility use a cane ability to climb steps?  yes do you drive?  no Do you have any goals in this area?  yes  Function disabled: date disabled na I need assistance with the following:  household duties and shopping  Neuro/Psych numbness tingling trouble walking spasms  Prior Studies Any changes since last visit?  no  Physicians involved in your care Any changes since last visit?  no   History reviewed. No pertinent family history. History   Social History  . Marital Status: Single    Spouse Name: N/A    Number of Children: N/A  . Years of Education: N/A   Social History Main Topics  . Smoking status: Former Smoker -- 0.50 packs/day for 30 years    Types: Cigarettes    Quit date: 04/02/2012  . Smokeless tobacco: Never Used  . Alcohol Use: No     Comment:  OCCASIONAL  . Drug Use: No  . Sexually Active: None   Other Topics Concern  . None   Social History Narrative   ** Merged History Encounter **       Past Surgical History  Procedure Laterality Date  . External fixation pelvis  04/03/2012    Procedure: EXTERNAL FIXATION PELVIS;  Surgeon: Budd Palmer, MD;  Location: [facility] OR;  Service: Orthopedics;;  . External fixation leg  04/03/2012    Procedure: EXTERNAL FIXATION LEG;  Surgeon: Budd Palmer, MD;  Location: [facility] OR;  Service: Orthopedics;  Laterality: Left;  Left femur  . Chest tube insertion  04/03/2012    Procedure: CHEST TUBE INSERTION;  Surgeon: Liz Malady, MD;  Location: [facility] OR;  Service: General;  Laterality: Left;  . Incision and drainage of wound  04/03/2012    Procedure: IRRIGATION AND DEBRIDEMENT WOUND;  Surgeon: Clydene Fake, MD;  Location: [facility] OR;  Service: Neurosurgery;  Laterality: N/A;  Frontal.  . Tibia im nail insertion  04/07/2012    Procedure: INTRAMEDULLARY (IM) NAIL TIBIAL;  Surgeon: Budd Palmer, MD;  Location: [facility] OR;  Service: Orthopedics;  Laterality: Left;  . Femur im nail  04/07/2012    Procedure: INTRAMEDULLARY (IM) NAIL FEMORAL;  Surgeon: Budd Palmer, MD;  Location: [facility] OR;  Service: Orthopedics;  Laterality: Left;  . Orif patella  04/07/2012    Procedure: OPEN REDUCTION INTERNAL (ORIF) FIXATION PATELLA;  Surgeon: Budd Palmer, MD;  Location: [facility] OR;  Service: Orthopedics;  Laterality: Left;  . Orif pelvic fracture  04/07/2012    Procedure: OPEN REDUCTION INTERNAL FIXATION (ORIF) PELVIC FRACTURE;  Surgeon: Budd Palmer, MD;  Location: [facility] OR;  Service: Orthopedics;  Laterality: N/A;  Right and left sacroiliac screw pinning,Irrigation and debridebridement open tibia and femur,removal external fixator.  . Flexible bronchoscopy  04/07/2012    Procedure: FLEXIBLE BRONCHOSCOPY;  Surgeon: Liz Malady, MD;  Location: [facility] OR;  Service: General;;  START TIME=1645 END TIME=1700  . Cholecystectomy   07/28/2012    Procedure: LAPAROSCOPIC CHOLECYSTECTOMY;  Surgeon: Shelly Rubenstein, MD;  Location: [facility] ORS;  Service: General;  Laterality: N/A;   Past Medical History  Diagnosis Date  . Obesity   . Hypertension   . Diabetes mellitus   . Headache(784.0)   . TOBACCO ABUSE 12/23/2008  . 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 GERD 05/05/2007  . TRIGGER FINGER 05/05/2007   BP 118/64  Pulse 95  Resp 16  Ht 6\' 2"  (1.88 m)  Wt 285 lb (129.275 kg)  BMI 36.58 kg/m2  SpO2 91%      Review of Systems  Constitutional: Positive for unexpected weight change.  Musculoskeletal: Positive for gait problem.  Neurological: Positive for numbness.       Tingling   All other systems reviewed and are negative.       Objective:   Physical Exam General: Alert and oriented x 3, No apparent distress  HEENT: Head is normocephalic  Neck: Supple  Chest: no distress  Extremities: No clubbing, cyanosis, or edema. Pulses are 2+  Neuro: Pt is alert. Oriented to date, place, reason. Conversationally he's appropriate.  Musculoskeletal:  UE strength grossly 5/5. RLE is grossly 5/5.  LLE Quad 4/5, ischiocrurale 4-/5, peroneus 3+/5, tibialis ant. 4-/5 .  He has decreased sensory to LT over the anterior/lateral leg and dorsum of the foot and likely the sole as well. There is also decreased LT over the right foot in a stocking glove distribution  Full ROM, No pain with AROM or PROM in the neck, trunk. Posture appropriate  Psych: Pt's affect is appropriate. Pt is cooperative         Assessment & Plan:  1. Traumatic brain injury with polytrauma, July 2013. Overall he continues to make progress.  2. 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  3. Left femur fracture treated with intramedullary nailing.  4. Left tibia fracture treated with intramedullary  nailing.  5. Left patella fracture and infrapatellar tendon injury, treated with open  reduction and internal fixation of patella.  6. Right peroneal nerve(or sciatic branch) injury  7. Diabetic PN?  Plan:  1. Continue with oxycodone 10mg  one q6prn #90. (3 per day on avg). His CSA was reviewed. A no tolerance policy will be taken going forward---dc from clinic if CSA is broken. He went to the [facility] clinic by referral and doesn't wish to return. He prefers that we rx his medications and he is willing to go by a CSA.   2. Recommended aquatic exercises in a [employer] for maintenance program. He has contacted the [facility] regarding financial assistance, but has not handed in the papers they require.  3.  Continue neurontin 800 mg tid for peroneal nerve pain   5. patient back in about 1 months.

## 2013-03-05 NOTE — Patient Instructions (Signed)
Continue with your walking program. 

## 2013-04-13 ENCOUNTER — Encounter: Payer: Self-pay | Admitting: Physical Medicine and Rehabilitation

## 2013-04-13 ENCOUNTER — Encounter
Payer: Medicaid Other | Attending: Physical Medicine and Rehabilitation | Admitting: Physical Medicine and Rehabilitation

## 2013-04-13 VITALS — BP 147/93 | HR 94 | Resp 17 | Ht 74.0 in | Wt 289.0 lb

## 2013-04-13 DIAGNOSIS — S82209A Unspecified fracture of shaft of unspecified tibia, initial encounter for closed fracture: Secondary | ICD-10-CM | POA: Insufficient documentation

## 2013-04-13 DIAGNOSIS — Z5189 Encounter for other specified aftercare: Secondary | ICD-10-CM

## 2013-04-13 DIAGNOSIS — K812 Acute cholecystitis with chronic cholecystitis: Secondary | ICD-10-CM

## 2013-04-13 DIAGNOSIS — G8921 Chronic pain due to trauma: Secondary | ICD-10-CM

## 2013-04-13 DIAGNOSIS — S069X0D Unspecified intracranial injury without loss of consciousness, subsequent encounter: Secondary | ICD-10-CM

## 2013-04-13 DIAGNOSIS — S8290XD Unspecified fracture of unspecified lower leg, subsequent encounter for closed fracture with routine healing: Secondary | ICD-10-CM

## 2013-04-13 DIAGNOSIS — G5730 Lesion of lateral popliteal nerve, unspecified lower limb: Secondary | ICD-10-CM

## 2013-04-13 DIAGNOSIS — IMO0001 Reserved for inherently not codable concepts without codable children: Secondary | ICD-10-CM

## 2013-04-13 DIAGNOSIS — S7290XA Unspecified fracture of unspecified femur, initial encounter for closed fracture: Secondary | ICD-10-CM | POA: Insufficient documentation

## 2013-04-13 DIAGNOSIS — S329XXD Fracture of unspecified parts of lumbosacral spine and pelvis, subsequent encounter for fracture with routine healing: Secondary | ICD-10-CM

## 2013-04-13 DIAGNOSIS — S020XXD Fracture of vault of skull, subsequent encounter for fracture with routine healing: Secondary | ICD-10-CM

## 2013-04-13 DIAGNOSIS — S7292XE Unspecified fracture of left femur, subsequent encounter for open fracture type I or II with routine healing: Secondary | ICD-10-CM

## 2013-04-13 DIAGNOSIS — S069X9S Unspecified intracranial injury with loss of consciousness of unspecified duration, sequela: Secondary | ICD-10-CM

## 2013-04-13 DIAGNOSIS — S82009A Unspecified fracture of unspecified patella, initial encounter for closed fracture: Secondary | ICD-10-CM | POA: Insufficient documentation

## 2013-04-13 DIAGNOSIS — S8410XA Injury of peroneal nerve at lower leg level, unspecified leg, initial encounter: Secondary | ICD-10-CM | POA: Insufficient documentation

## 2013-04-13 DIAGNOSIS — X58XXXA Exposure to other specified factors, initial encounter: Secondary | ICD-10-CM | POA: Insufficient documentation

## 2013-04-13 DIAGNOSIS — G5732 Lesion of lateral popliteal nerve, left lower limb: Secondary | ICD-10-CM

## 2013-04-13 DIAGNOSIS — S82002D Unspecified fracture of left patella, subsequent encounter for closed fracture with routine healing: Secondary | ICD-10-CM

## 2013-04-13 DIAGNOSIS — S069X0S Unspecified intracranial injury without loss of consciousness, sequela: Secondary | ICD-10-CM

## 2013-04-13 DIAGNOSIS — S7290XD Unspecified fracture of unspecified femur, subsequent encounter for closed fracture with routine healing: Secondary | ICD-10-CM

## 2013-04-13 DIAGNOSIS — S069X9A Unspecified intracranial injury with loss of consciousness of unspecified duration, initial encounter: Secondary | ICD-10-CM | POA: Insufficient documentation

## 2013-04-13 DIAGNOSIS — S32509A Unspecified fracture of unspecified pubis, initial encounter for closed fracture: Secondary | ICD-10-CM | POA: Insufficient documentation

## 2013-04-13 MED ORDER — OXYCODONE HCL 10 MG PO TABS: 10.0000 mg | ORAL_TABLET | Freq: Four times a day (QID) | ORAL | Status: DC | PRN

## 2013-04-13 NOTE — Patient Instructions (Signed)
Stay as active as tolerated. 

## 2013-04-13 NOTE — Progress Notes (Signed)
Subjective:    Patient ID: Miguel Brady, male    DOB: 1960/11/03, 52 y.o.   MRN: [id number]  HPI Miguel Brady is back regarding his polytrauma and TBI, July 2013. He completed PT . The increase of his gabapentin has helped with his radiating symptoms. He is using oxycodone up to three x per day. He is walking with his cane.  He still is having pain in his left leg which can bother him throughout the day. It is usually tingling and aching. He reports, that he has looked into [place] [facility] in a [place], but he is a little overwhelmed with the paperwork he has to do to get some financial help. He states, that he will do this eventually. He still has not done this, he states, that he is waiting to get disability. He was denied for disability, he gets SSI.   Pain Inventory Average Pain 9 Pain Right Now 9 My pain is aching  In the last 24 hours, has pain interfered with the following? General activity 9 Relation with others 6 Enjoyment of life 8 What TIME of day is your pain at its worst? constant Sleep (in general) Fair  Pain is worse with: walking, bending, sitting, standing and some activites Pain improves with: rest, heat/ice and medication Relief from Meds: 9  Mobility walk without assistance walk with assistance use a cane how many minutes can you walk? 25 ability to climb steps?  yes do you drive?  no Do you have any goals in this area?  yes  Function Do you have any goals in this area?  no  Neuro/Psych No problems in this area  Prior Studies Any changes since last visit?  yes x-rays  Physicians involved in your care Any changes since last visit?  no   History reviewed. No pertinent family history. History   Social History  . Marital Status: Single    Spouse Name: N/A    Number of Children: N/A  . Years of Education: N/A   Social History Main Topics  . Smoking status: Former Smoker -- 0.50 packs/day for 30 years    Types: Cigarettes    Quit date:  04/02/2012  . Smokeless tobacco: Never Used  . Alcohol Use: No     Comment: OCCASIONAL  . Drug Use: No  . Sexually Active: None   Other Topics Concern  . None   Social History Narrative   ** Merged History Encounter **       Past Surgical History  Procedure Laterality Date  . External fixation pelvis  04/03/2012    Procedure: EXTERNAL FIXATION PELVIS;  Surgeon: Budd Palmer, MD;  Location: [facility] OR;  Service: Orthopedics;;  . External fixation leg  04/03/2012    Procedure: EXTERNAL FIXATION LEG;  Surgeon: Budd Palmer, MD;  Location: [facility] OR;  Service: Orthopedics;  Laterality: Left;  Left femur  . Chest tube insertion  04/03/2012    Procedure: CHEST TUBE INSERTION;  Surgeon: Liz Malady, MD;  Location: [facility] OR;  Service: General;  Laterality: Left;  . Incision and drainage of wound  04/03/2012    Procedure: IRRIGATION AND DEBRIDEMENT WOUND;  Surgeon: Clydene Fake, MD;  Location: [facility] OR;  Service: Neurosurgery;  Laterality: N/A;  Frontal.  . Tibia im nail insertion  04/07/2012    Procedure: INTRAMEDULLARY (IM) NAIL TIBIAL;  Surgeon: Budd Palmer, MD;  Location: [facility] OR;  Service: Orthopedics;  Laterality: Left;  . Femur im nail  04/07/2012  Procedure: INTRAMEDULLARY (IM) NAIL FEMORAL;  Surgeon: Budd Palmer, MD;  Location: [facility] OR;  Service: Orthopedics;  Laterality: Left;  . Orif patella  04/07/2012    Procedure: OPEN REDUCTION INTERNAL (ORIF) FIXATION PATELLA;  Surgeon: Budd Palmer, MD;  Location: [facility] OR;  Service: Orthopedics;  Laterality: Left;  . Orif pelvic fracture  04/07/2012    Procedure: OPEN REDUCTION INTERNAL FIXATION (ORIF) PELVIC FRACTURE;  Surgeon: Budd Palmer, MD;  Location: [facility] OR;  Service: Orthopedics;  Laterality: N/A;  Right and left sacroiliac screw pinning,Irrigation and debridebridement open tibia and femur,removal external fixator.  . Flexible bronchoscopy  04/07/2012    Procedure: FLEXIBLE BRONCHOSCOPY;  Surgeon: Liz Malady, MD;  Location: [facility] OR;   Service: General;;  START TIME=1645 END TIME=1700  . Cholecystectomy  07/28/2012    Procedure: LAPAROSCOPIC CHOLECYSTECTOMY;  Surgeon: Shelly Rubenstein, MD;  Location: [facility] ORS;  Service: General;  Laterality: N/A;   Past Medical History  Diagnosis Date  . Obesity   . Hypertension   . Diabetes mellitus   . Headache(784.0)   . TOBACCO ABUSE 12/23/2008  . 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 GERD 05/05/2007  . TRIGGER FINGER 05/05/2007   BP 147/93  Pulse 94  Resp 17  Ht 6\' 2"  (1.88 m)  Wt 289 lb (131.09 kg)  BMI 37.09 kg/m2  SpO2 95%     Review of Systems  All other systems reviewed and are negative.       Objective:   Physical Exam General: Alert and oriented x 3, No apparent distress  HEENT: Head is normocephalic  Neck: Supple  Chest: no distress  Extremities: No clubbing, cyanosis, or edema. Pulses are 2+  Neuro: Pt is alert. Oriented to date, place, reason. Conversationally he's appropriate.  Musculoskeletal:  UE strength grossly 5/5. RLE is grossly 5/5.  LLE Quad 4/5, ischiocrurale 4-/5, peroneus 3+/5, tibialis ant. 4-/5 .  He has decreased sensory to LT over the anterior/lateral leg and dorsum of the foot and likely the sole as well. There is also decreased LT over the right foot in a stocking glove distribution  Full ROM, No pain with AROM or PROM in the neck, trunk. Posture appropriate  Psych: Pt's affect is appropriate. Pt is cooperative         Assessment & Plan:  1. Traumatic brain injury with polytrauma, July 2013. Overall he continues to make progress.  2. 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  3. Left femur fracture treated with intramedullary nailing.  4. Left tibia fracture treated with intramedullary nailing.  5. Left patella fracture and infrapatellar tendon injury, treated with open   reduction and internal fixation of patella.  6. Right peroneal nerve(or sciatic branch) injury  7. Diabetic PN?  Plan:  1. Continue with oxycodone 10mg  one q6prn #90. (3 per day on avg). His CSA was reviewed. A no tolerance policy will be taken going forward---dc from clinic if CSA is broken. He went to the [facility] clinic by referral and doesn't wish to return. He prefers that we rx his medications and he is willing to go by a CSA.  2. Recommended aquatic exercises in a [employer] for maintenance program. He has contacted the [facility] regarding financial assistance, but has not handed in the papers they require.  3. Continue neurontin 800 mg tid for peroneal nerve pain  5. patient back in about 1 month, he wants to talk to Dr. Riley Kill about an increase of his Oxycodone. I recommended aquatic exercising to strengthen his muscles and help with his pain in that way.

## 2013-05-14 ENCOUNTER — Encounter: Payer: Self-pay | Admitting: Physical Medicine & Rehabilitation

## 2013-05-14 ENCOUNTER — Encounter: Payer: Medicaid Other | Attending: Physical Medicine and Rehabilitation | Admitting: Physical Medicine & Rehabilitation

## 2013-05-14 VITALS — BP 154/82 | HR 90 | Resp 16 | Ht 74.0 in | Wt 290.0 lb

## 2013-05-14 DIAGNOSIS — X58XXXS Exposure to other specified factors, sequela: Secondary | ICD-10-CM | POA: Insufficient documentation

## 2013-05-14 DIAGNOSIS — IMO0001 Reserved for inherently not codable concepts without codable children: Secondary | ICD-10-CM

## 2013-05-14 DIAGNOSIS — Z5181 Encounter for therapeutic drug level monitoring: Secondary | ICD-10-CM

## 2013-05-14 DIAGNOSIS — S329XXS Fracture of unspecified parts of lumbosacral spine and pelvis, sequela: Secondary | ICD-10-CM

## 2013-05-14 DIAGNOSIS — S069X0D Unspecified intracranial injury without loss of consciousness, subsequent encounter: Secondary | ICD-10-CM

## 2013-05-14 DIAGNOSIS — G5732 Lesion of lateral popliteal nerve, left lower limb: Secondary | ICD-10-CM

## 2013-05-14 DIAGNOSIS — S329XXD Fracture of unspecified parts of lumbosacral spine and pelvis, subsequent encounter for fracture with routine healing: Secondary | ICD-10-CM

## 2013-05-14 DIAGNOSIS — Z5189 Encounter for other specified aftercare: Secondary | ICD-10-CM

## 2013-05-14 DIAGNOSIS — S069X0S Unspecified intracranial injury without loss of consciousness, sequela: Secondary | ICD-10-CM

## 2013-05-14 DIAGNOSIS — S7292XE Unspecified fracture of left femur, subsequent encounter for open fracture type I or II with routine healing: Secondary | ICD-10-CM

## 2013-05-14 DIAGNOSIS — S82002D Unspecified fracture of left patella, subsequent encounter for closed fracture with routine healing: Secondary | ICD-10-CM

## 2013-05-14 DIAGNOSIS — S82009P Unspecified fracture of unspecified patella, subsequent encounter for closed fracture with malunion: Secondary | ICD-10-CM

## 2013-05-14 DIAGNOSIS — S069X9S Unspecified intracranial injury with loss of consciousness of unspecified duration, sequela: Secondary | ICD-10-CM

## 2013-05-14 DIAGNOSIS — S020XXD Fracture of vault of skull, subsequent encounter for fracture with routine healing: Secondary | ICD-10-CM

## 2013-05-14 DIAGNOSIS — S8290XD Unspecified fracture of unspecified lower leg, subsequent encounter for closed fracture with routine healing: Secondary | ICD-10-CM

## 2013-05-14 DIAGNOSIS — S7290XD Unspecified fracture of unspecified femur, subsequent encounter for closed fracture with routine healing: Secondary | ICD-10-CM

## 2013-05-14 DIAGNOSIS — G5730 Lesion of lateral popliteal nerve, unspecified lower limb: Secondary | ICD-10-CM

## 2013-05-14 DIAGNOSIS — IMO0002 Reserved for concepts with insufficient information to code with codable children: Secondary | ICD-10-CM

## 2013-05-14 DIAGNOSIS — K812 Acute cholecystitis with chronic cholecystitis: Secondary | ICD-10-CM

## 2013-05-14 DIAGNOSIS — S069XAS Unspecified intracranial injury with loss of consciousness status unknown, sequela: Secondary | ICD-10-CM

## 2013-05-14 DIAGNOSIS — Z79899 Other long term (current) drug therapy: Secondary | ICD-10-CM

## 2013-05-14 MED ORDER — OXYCODONE HCL 10 MG PO TABS: 10.0000 mg | ORAL_TABLET | Freq: Four times a day (QID) | ORAL | Status: DC | PRN

## 2013-05-14 NOTE — Patient Instructions (Signed)
CONTINUE TO WORK ON YOUR DIET. EAT 3-4 SMALL MEALS EACH DAY

## 2013-05-14 NOTE — Progress Notes (Signed)
Subjective:    Patient ID: Miguel Brady, male    DOB: 1961/03/08, 52 y.o.   MRN: [id number]  HPI  Miguel Brady is back regarding his polytrauma and chronic pain. Things haven't changed dramatically since I last saw him. Pain remains an issue and is limiting as far as his mobility is concerned.   He is not using a walker. He sometimes furniture walks to for support.   The left thigh gives him problems. He followed up with Dr. Carola Frost who asked him to follow up prn.   Pain Inventory  Average Pain 9  Pain Right Now 9  My pain is aching  In the last 24 hours, has pain interfered with the following?  General activity 9  Relation with others 6  Enjoyment of life 8  What TIME of day is your pain at its worst? constant  Sleep (in general) Fair  Pain is worse with: walking, bending, sitting, standing and some activites  Pain improves with: rest, heat/ice and medication  Relief from Meds: 9  Mobility  walk without assistance  walk with assistance  use a cane  how many minutes can you walk? 25  ability to climb steps? yes  do you drive? no  Do you have any goals in this area? yes  Function  Do you have any goals in this area? no  Neuro/Psych  No problems in this area  Prior Studies  Any changes since last visit? yes  x-rays  Physicians involved in your care  Any changes since last visit? no    History reviewed. No pertinent family history. History   Social History  . Marital Status: Single    Spouse Name: N/A    Number of Children: N/A  . Years of Education: N/A   Social History Main Topics  . Smoking status: Former Smoker -- 0.50 packs/day for 30 years    Types: Cigarettes    Quit date: 04/02/2012  . Smokeless tobacco: Never Used  . Alcohol Use: No     Comment: OCCASIONAL  . Drug Use: No  . Sexual Activity: None   Other Topics Concern  . None   Social History Narrative   ** Merged History Encounter **       Past Surgical History  Procedure Laterality  Date  . External fixation pelvis  04/03/2012    Procedure: EXTERNAL FIXATION PELVIS;  Surgeon: Budd Palmer, MD;  Location: [facility] OR;  Service: Orthopedics;;  . External fixation leg  04/03/2012    Procedure: EXTERNAL FIXATION LEG;  Surgeon: Budd Palmer, MD;  Location: [facility] OR;  Service: Orthopedics;  Laterality: Left;  Left femur  . Chest tube insertion  04/03/2012    Procedure: CHEST TUBE INSERTION;  Surgeon: Liz Malady, MD;  Location: [facility] OR;  Service: General;  Laterality: Left;  . Incision and drainage of wound  04/03/2012    Procedure: IRRIGATION AND DEBRIDEMENT WOUND;  Surgeon: Clydene Fake, MD;  Location: [facility] OR;  Service: Neurosurgery;  Laterality: N/A;  Frontal.  . Tibia im nail insertion  04/07/2012    Procedure: INTRAMEDULLARY (IM) NAIL TIBIAL;  Surgeon: Budd Palmer, MD;  Location: [facility] OR;  Service: Orthopedics;  Laterality: Left;  . Femur im nail  04/07/2012    Procedure: INTRAMEDULLARY (IM) NAIL FEMORAL;  Surgeon: Budd Palmer, MD;  Location: [facility] OR;  Service: Orthopedics;  Laterality: Left;  . Orif patella  04/07/2012    Procedure: OPEN REDUCTION INTERNAL (ORIF) FIXATION PATELLA;  Surgeon: Budd Palmer, MD;  Location: [facility] OR;  Service: Orthopedics;  Laterality: Left;  . Orif pelvic fracture  04/07/2012    Procedure: OPEN REDUCTION INTERNAL FIXATION (ORIF) PELVIC FRACTURE;  Surgeon: Budd Palmer, MD;  Location: [facility] OR;  Service: Orthopedics;  Laterality: N/A;  Right and left sacroiliac screw pinning,Irrigation and debridebridement open tibia and femur,removal external fixator.  . Flexible bronchoscopy  04/07/2012    Procedure: FLEXIBLE BRONCHOSCOPY;  Surgeon: Liz Malady, MD;  Location: [facility] OR;  Service: General;;  START TIME=1645 END TIME=1700  . Cholecystectomy  07/28/2012    Procedure: LAPAROSCOPIC CHOLECYSTECTOMY;  Surgeon: Shelly Rubenstein, MD;  Location: [facility] ORS;  Service: General;  Laterality: N/A;   Past Medical History  Diagnosis Date  . Obesity   .  Hypertension   . Diabetes mellitus   . Headache(784.0)   . TOBACCO ABUSE 12/23/2008  . 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 GERD 05/05/2007  . TRIGGER FINGER 05/05/2007   BP 154/82  Pulse 90  Resp 16  Ht 6\' 2"  (1.88 m)  Wt 290 lb (131.543 kg)  BMI 37.22 kg/m2  SpO2 93%     Review of Systems  All other systems reviewed and are negative.       Objective:   Physical Exam  General: Alert and oriented x 3, No apparent distress. He has gained further abdominal weight HEENT: Head is normocephalic, atraumatic, PERRLA, EOMI, sclera anicteric, oral mucosa pink and moist, dentition intact, ext ear canals clear,  Neck: Supple without JVD or lymphadenopathy  Heart: Reg rate and rhythm. No murmurs rubs or gallops  Chest: CTA bilaterally without wheezes, rales, or rhonchi; no distress  Abdomen: Soft, non-tender, non-distended, bowel sounds positive.  Extremities: No clubbing, cyanosis, or edema. Pulses are 2+  Skin: he has multiple scars over the left leg.  Neuro: Pt is alert. Oriented to date, place, reason. Conversationally he's appropriate. He is NOT impulsive today.  He has mild STM deficits which seem to be improving.. UE strength grossly 5/5. RLE is grossly 4//5. LLE is 2 to 2+/5 with HF and KE. He has 3.5 APF and 2/5 ADR. He has limited left KE/KF due to pain. He has decreased sensory to LT over the anterior/lateral leg and dorsum of the foot and likely the sole as well. There is also decreased LT over the right foot in a stocking glove distribution  Musculoskeletal: Full ROM, No pain with AROM or PROM in the neck, trunk. He has callus over the medial anterior femur. There is a small lump which appears to be a scar over the lateral gluteal. Posture appropriate . LEFT femur is more tender to palpation. He displays increased antalgia LLE today. Left knee with small effusion but stable. No instability.  Psych: Pt's affect is appropriate. Pt is cooperative   Assessment & Plan:   Assessment:   1. Traumatic brain injury with polytrauma. Overall he continues to make progress.  2. 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  3. Left femur fracture treated with intramedullary nailing.  4. Left tibia fracture treated with intramedullary nailing.  5. Left patella fracture and infrapatellar tendon injury, treated with open  reduction and internal fixation of patella.  6. Right peroneal nerve(or sciatic branch) injury  7. Diabetic PN?    Plan:  1. Continue with oxycodone 10mg  one q6prn. i  gave him an additional 30 to allow him one every 6 prn. CSA was reviewed. A no tolerance policy will be taken going forward---dc from clinic if CSA is broken.  2. We discussed vocational opportunities. I made him a voc rehab referral.  3. Reviewed appropriate dietary habits. A dietary referral was made.  4. Continue gabapentin to 800mg  tid for peroneal nerve pain and ? DPN.  5. My PA will see the patient back in about 1 months. 25 minutes of face to face patient care time were spent during this visit. All questions were encouraged and answered.

## 2013-05-14 NOTE — Addendum Note (Signed)
Addended by: Doreene Eland on: 05/14/2013 11:43 AM   Modules accepted: Orders

## 2013-05-25 ENCOUNTER — Encounter: Payer: Self-pay | Admitting: *Deleted

## 2013-05-25 ENCOUNTER — Telehealth: Payer: Self-pay | Admitting: *Deleted

## 2013-05-25 NOTE — Telephone Encounter (Signed)
Notified Miguel Brady that we are discharging him from clinic and he will be receiving a letter with area pain clinics and instructions to wean off his oxycodone.

## 2013-06-14 ENCOUNTER — Ambulatory Visit: Payer: Medicaid Other | Admitting: Physical Medicine and Rehabilitation

## 2013-06-27 IMAGING — CR DG PELVIS 3+V JUDET
4 series · 4 of 4 positions shown · non-contrast
Comparison: Multiple prior examinations including intraoperative
imaging earlier today

CLINICAL DATA: Trauma and status post ORIF of the pelvis and left
lower extremity.

JUDET PELVIS - 3+ VIEW

[AP (1 of 4)]
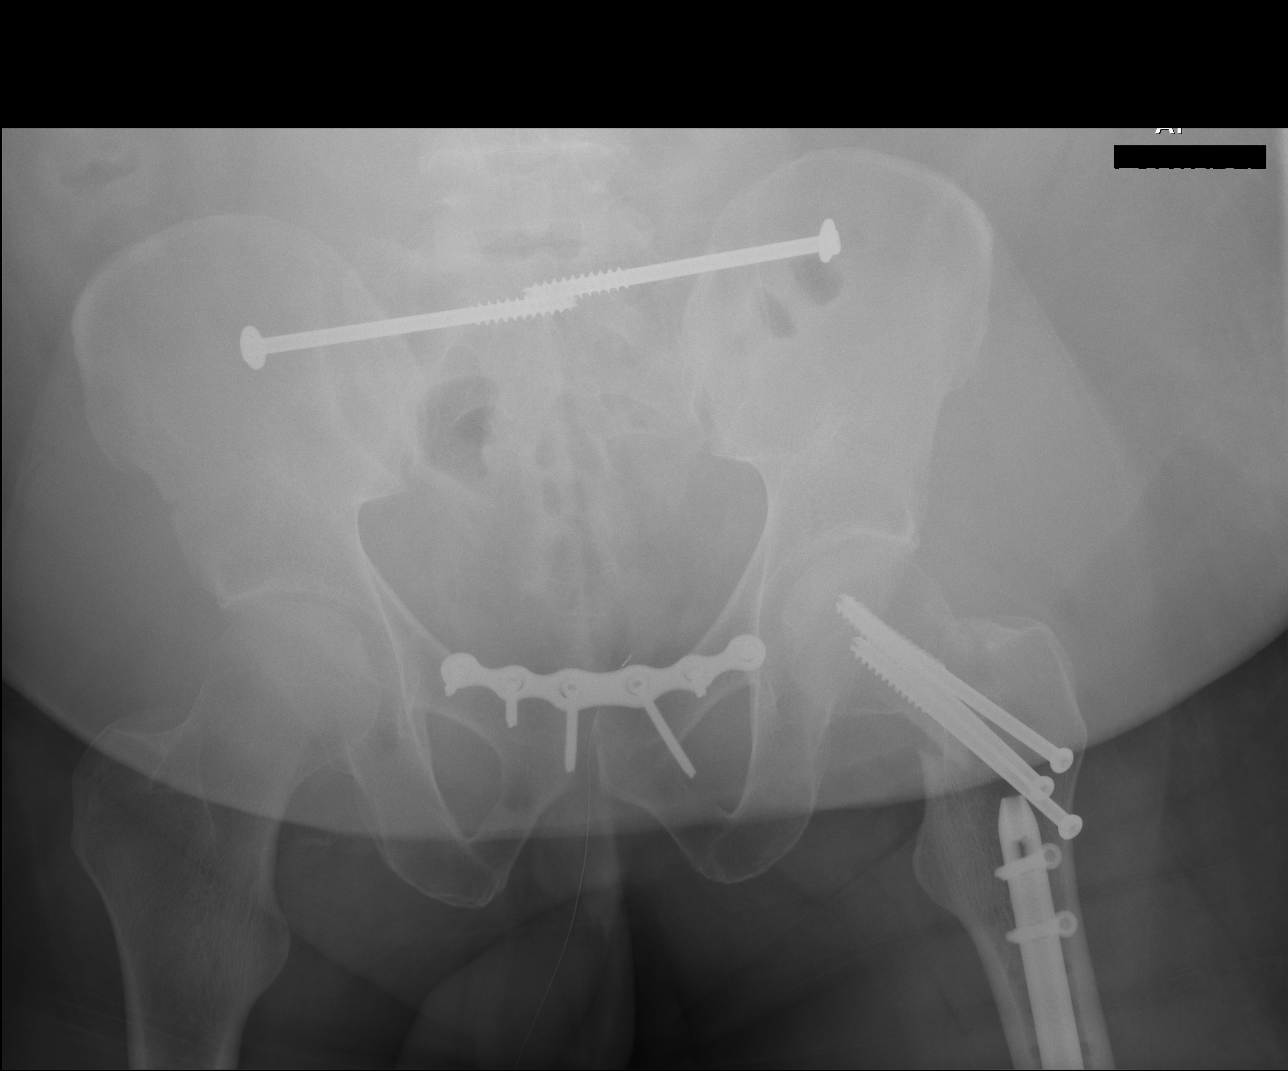

[AP (2 of 4)]
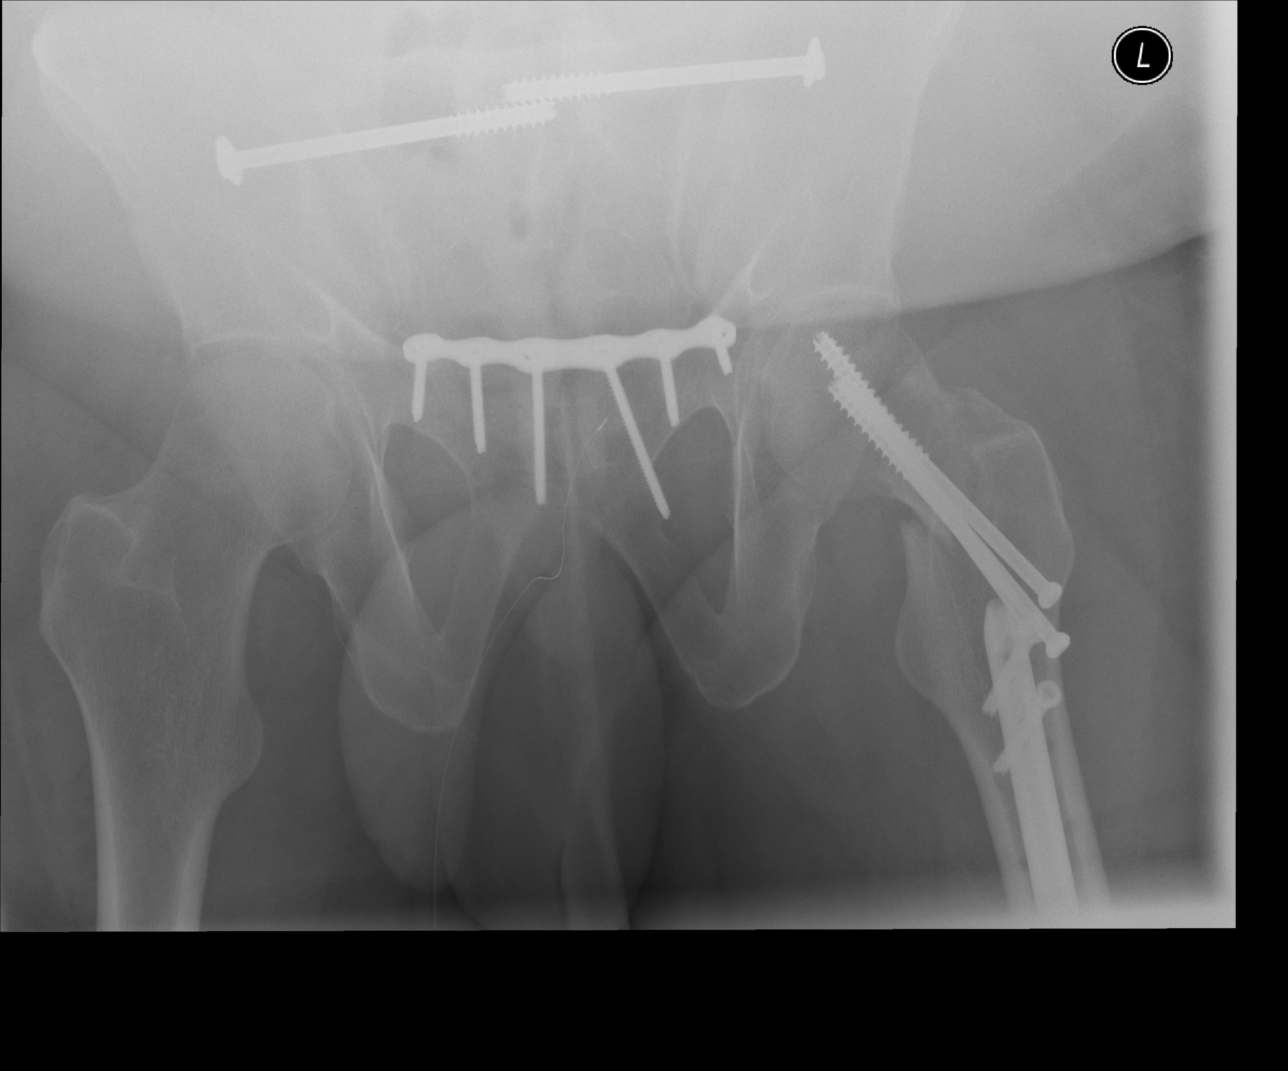

[AP (3 of 4)]
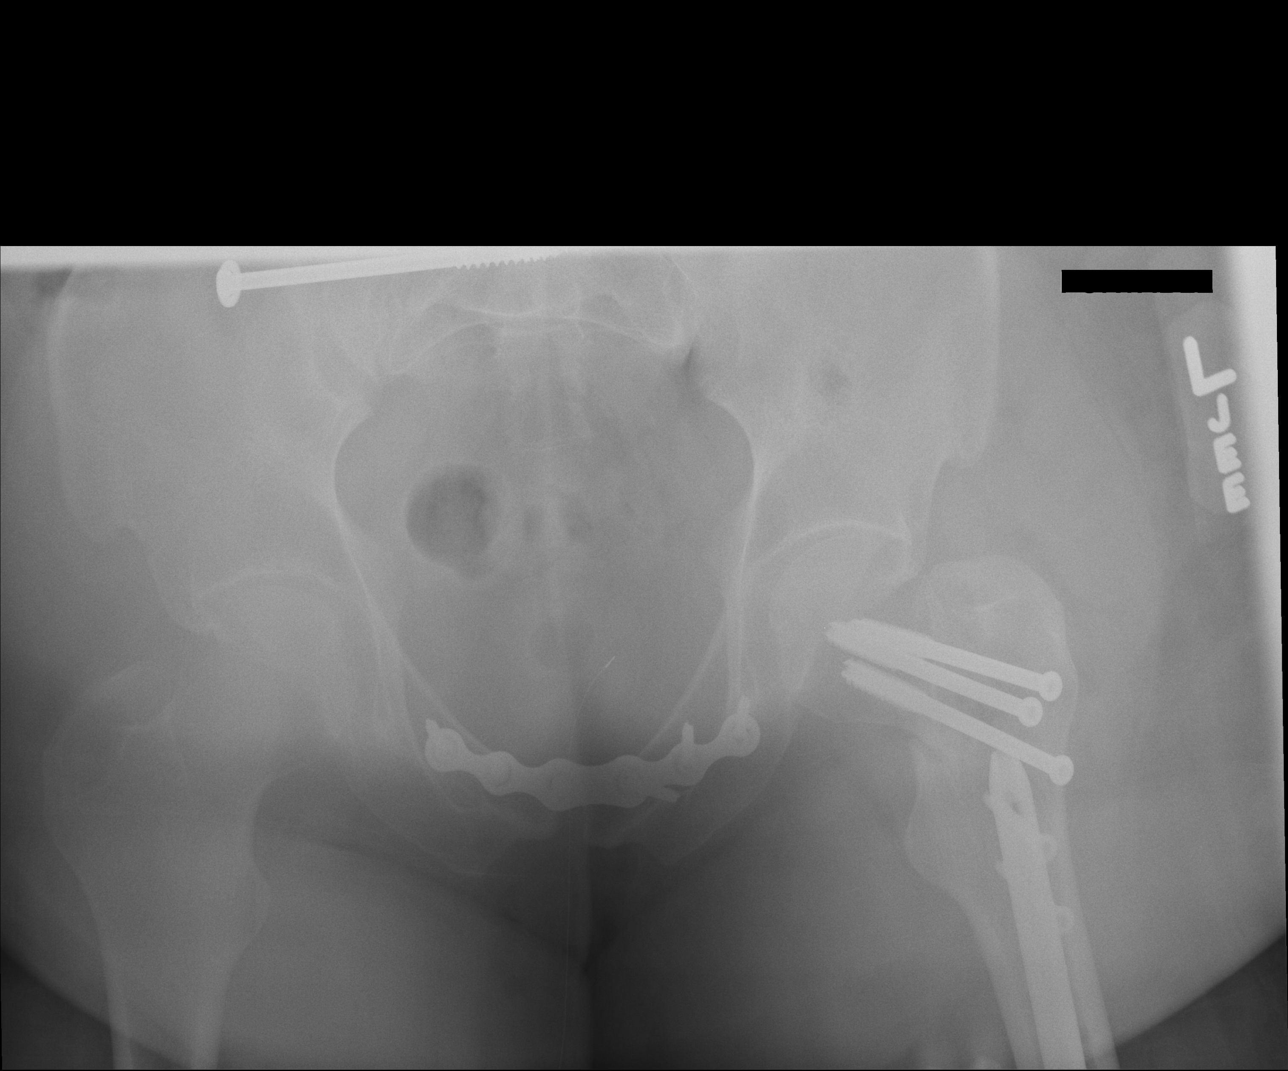

[AP (4 of 4)]
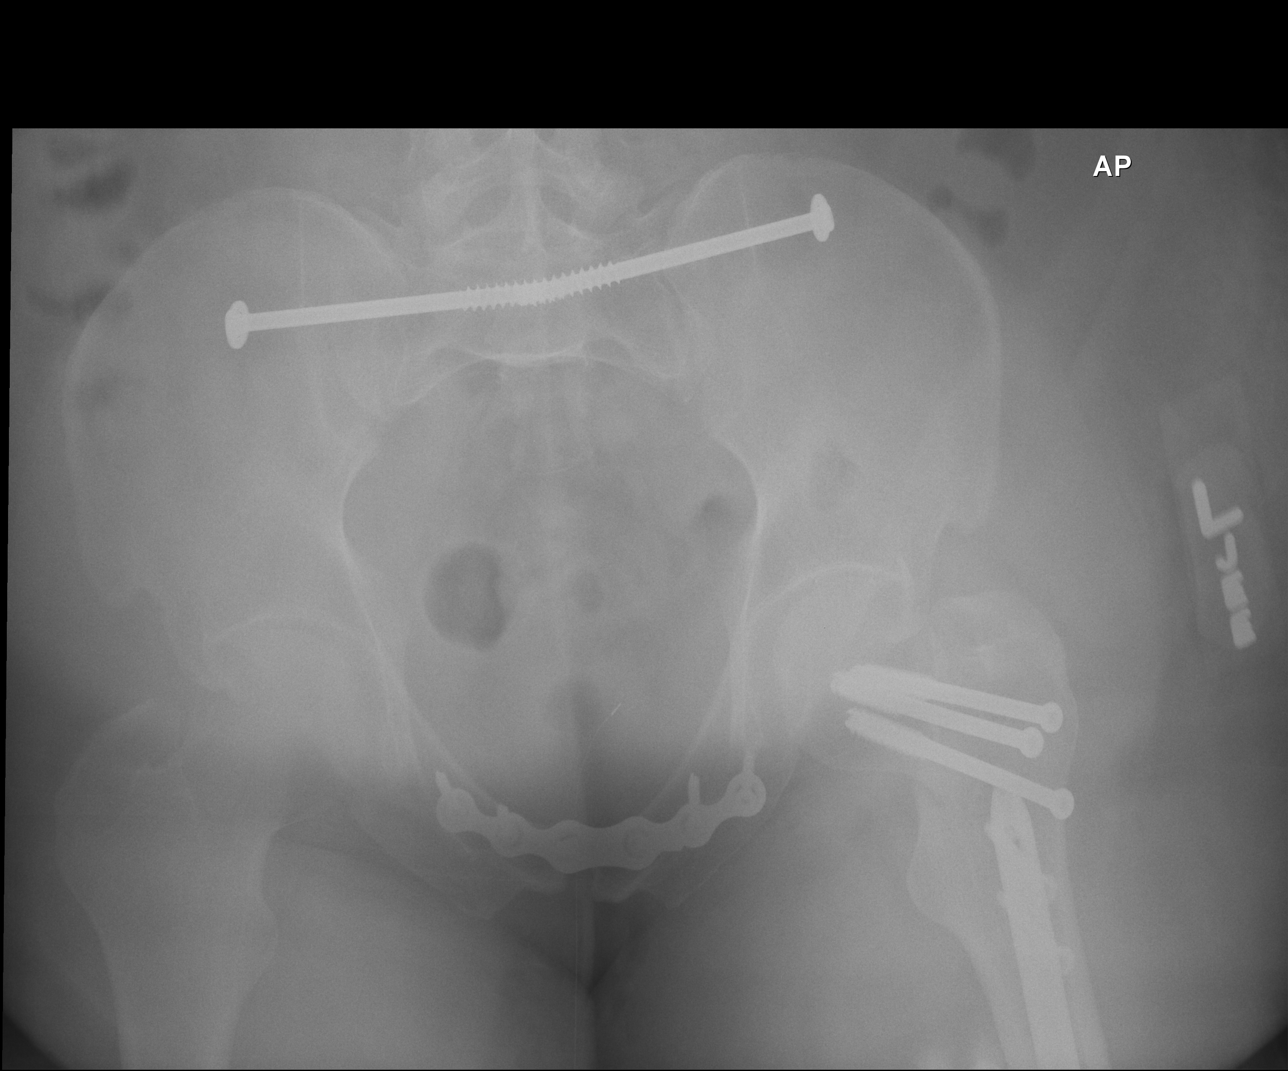

[4 of 4 positions shown; findings below may reference images not displayed]

FINDINGS: Alignment at the level of the sacroiliac joints and pubic
symphysis appear improved after ORIF.  Transversely oriented screws
present across the upper sacroiliac joints and superior
reconstruction plate noted across the pubic symphysis.  There
remains some probable widening of the inferior sacroiliac joints
bilaterally based on radiographic appearance.  The pubic symphysis
shows normal alignment.  Alignment is improved at the level of a
left femoral neck fracture.
IMPRESSION: After ORIF, alignment is improved at the level of symphysial
diastasis.  Screws are present across the sacroiliac joints with
some persistent widening of the inferior SI joints
radiographically.

## 2013-06-27 IMAGING — CR DG TIBIA/FIBULA PORT 2V*L*
4 series · 4 of 4 positions shown · non-contrast
Comparison: Multiple exams, including 04/07/2010

CLINICAL DATA: Postoperative for intramedullary nail in the left
tibia.

PORTABLE LEFT TIBIA AND FIBULA - 2 VIEW

[AP (1 of 2)]
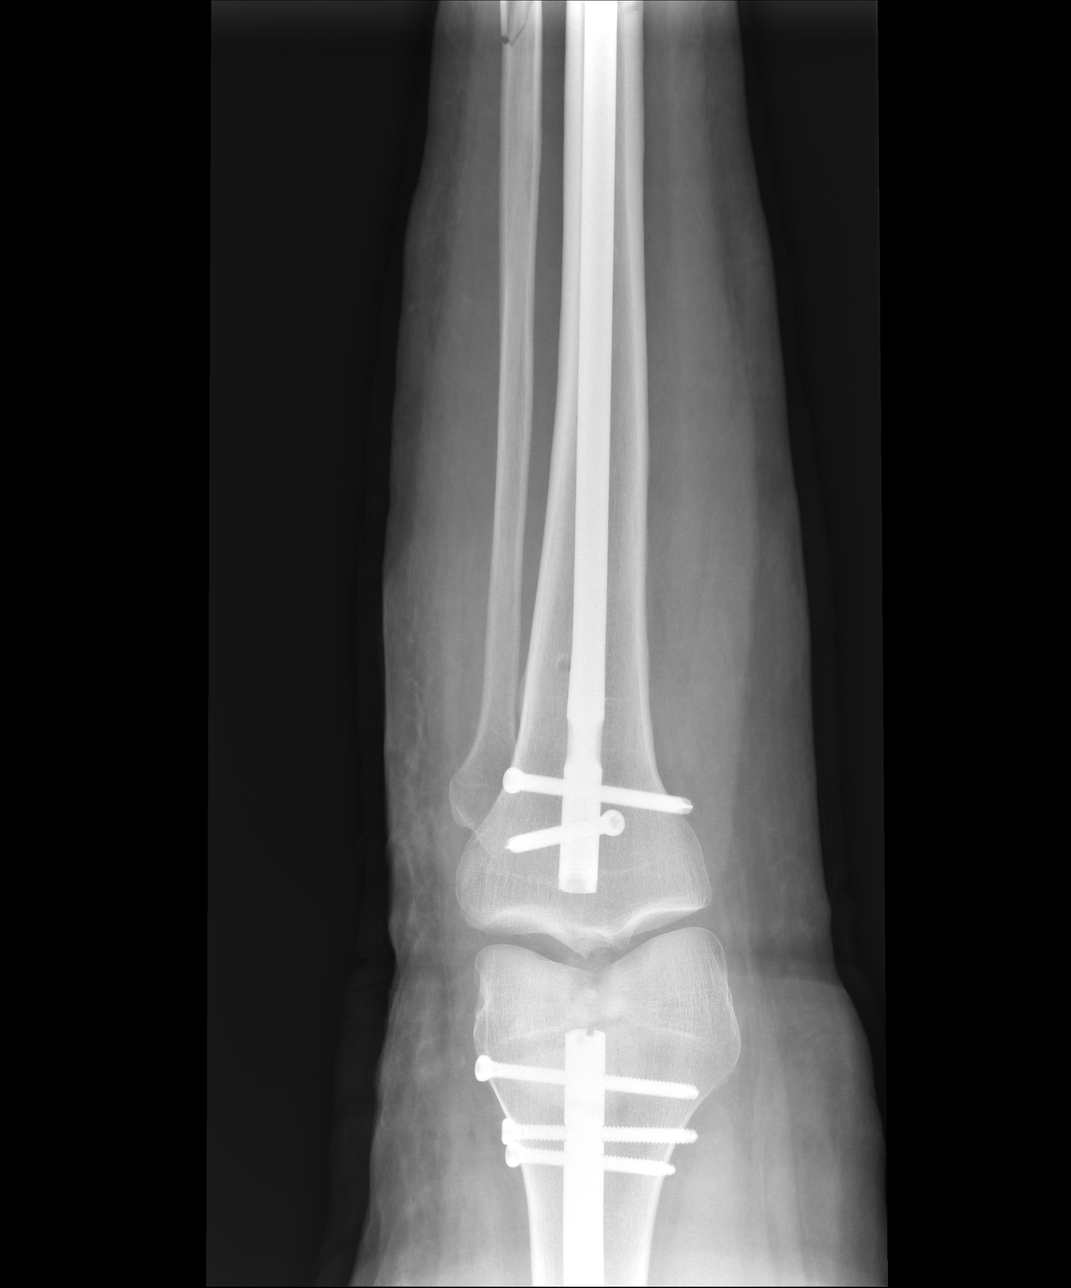

[lateral (1 of 2)]
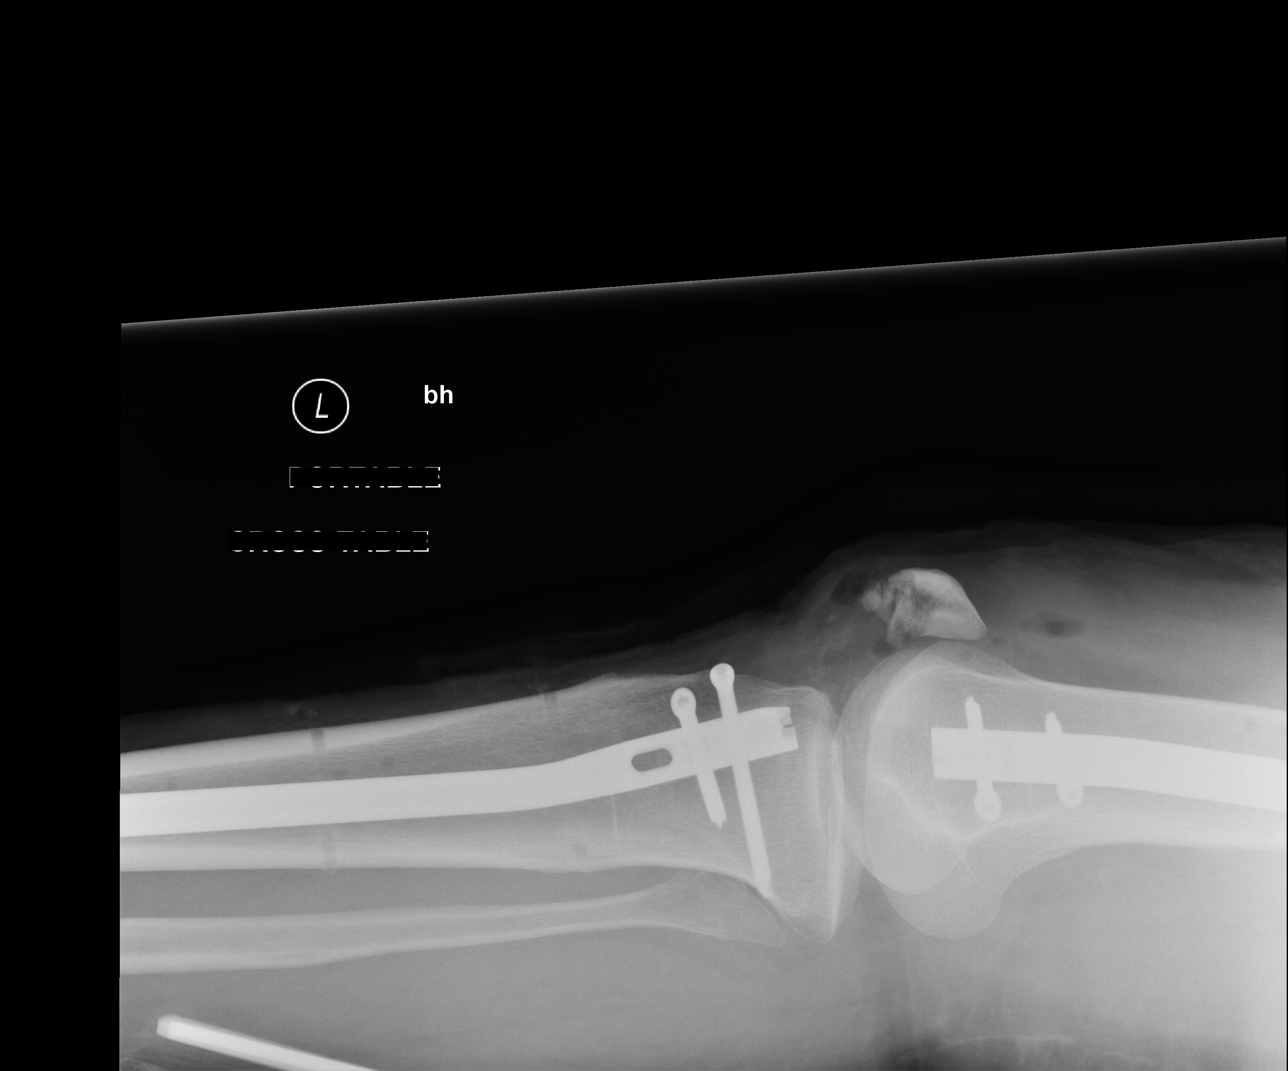

[AP (2 of 2)]
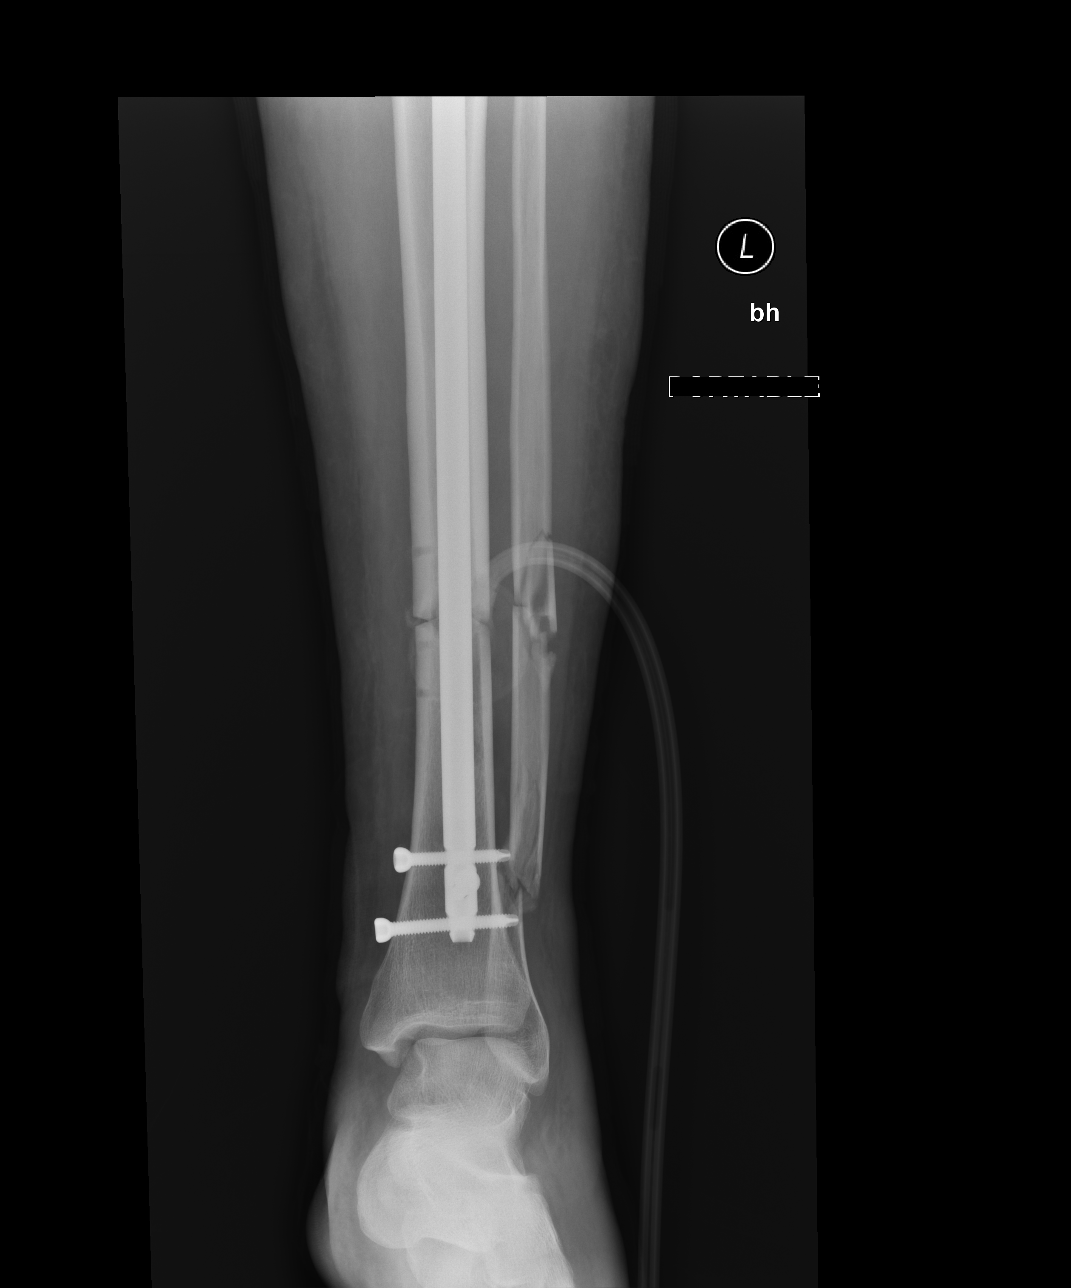

[lateral (2 of 2)]
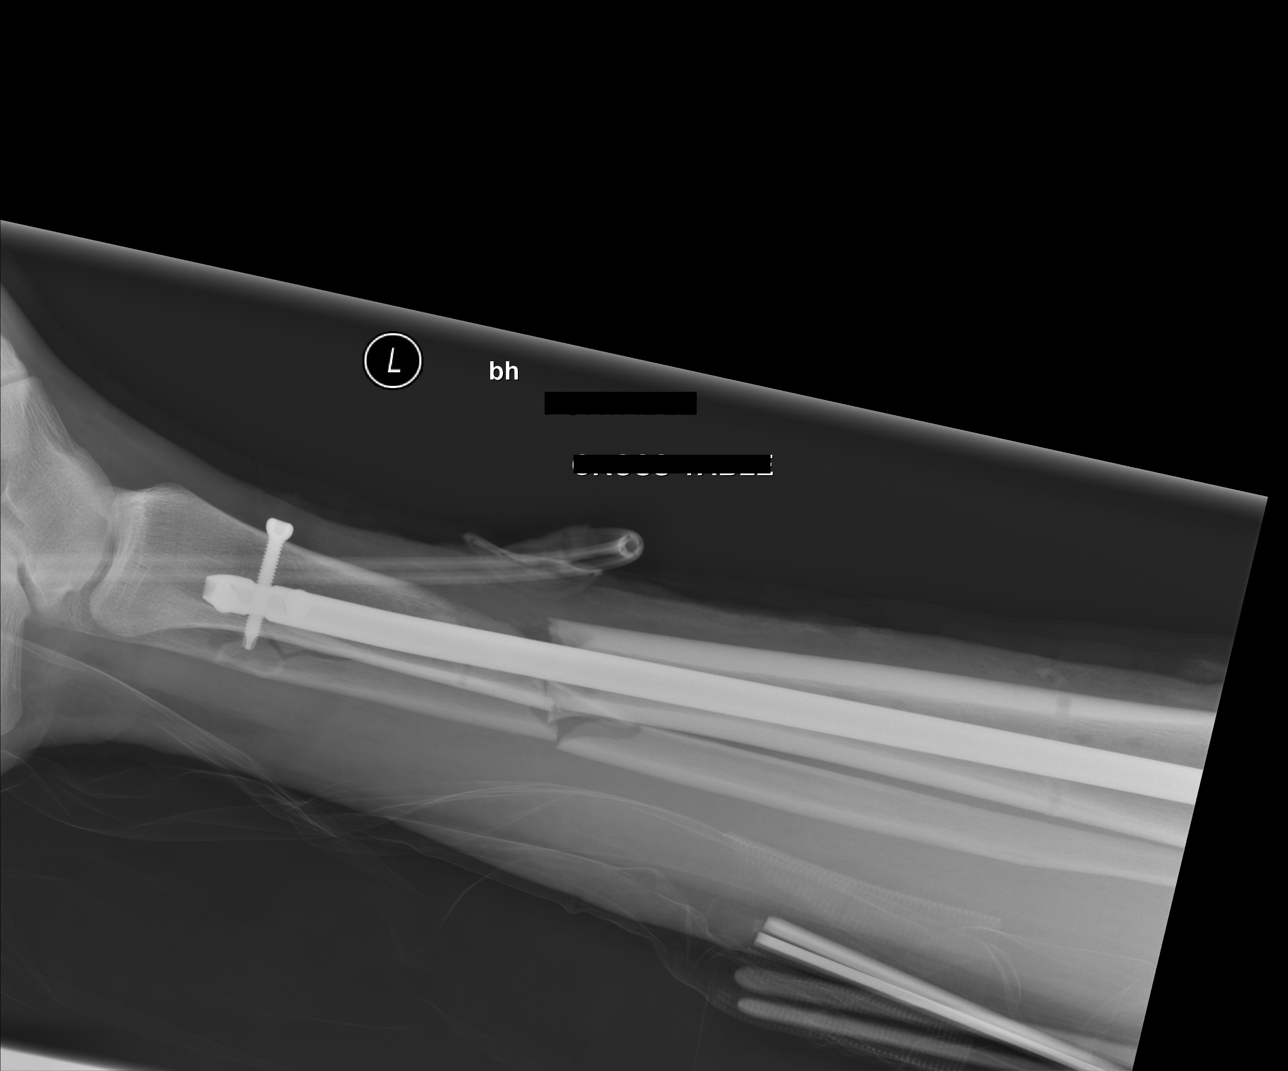

[4 of 4 positions shown; findings below may reference images not displayed]

FINDINGS: Dedicated radiographs of the right tibia / fibula
demonstrate an antegrade intramedullary nail with to proximal
orthogonal screws and three distal screws for fixation.  The nail
traverses the distal tibial diaphyseal fracture.  There is also a
comminuted segmental distal fibular fracture.  Alignment is
satisfactory.  There may be a fragment absent anteriorly at the
distal tibial fracture site.  Also, distal half of the patella
appears to have been resected.  Gas noted in the knee joint.
IMPRESSION: 1.  Antegrade intramedullary rod traverses the distal tibial
fracture site, with good alignment.  There may be an absent
fragment anteriorly.
2.  Comminuted distal fibular fracture.
3.  Apparent resection of the distal half of the patella.

## 2013-06-29 IMAGING — CR DG CHEST 1V PORT
1 series · 1 of 1 positions shown · non-contrast
Comparison: 04/08/2012

CLINICAL DATA: Right upper lobe atelectasis

PORTABLE CHEST - 1 VIEW

[AP]
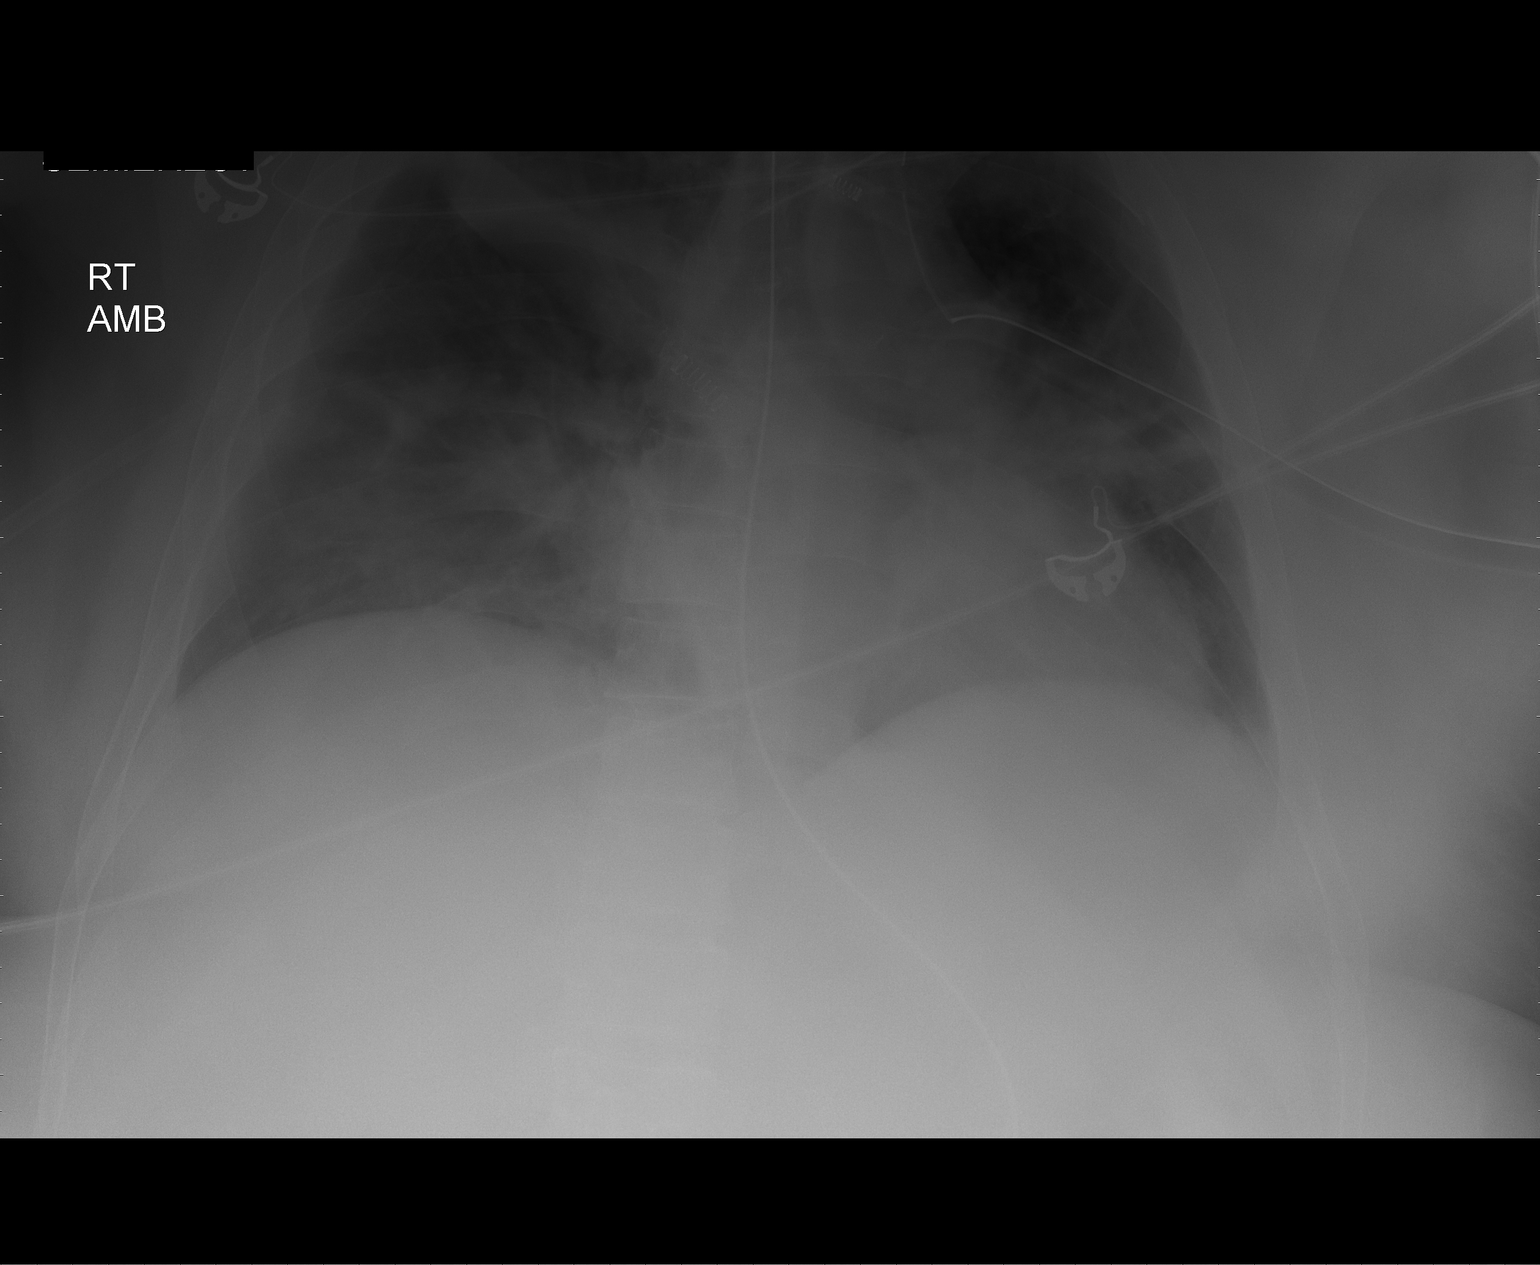

[1 of 1 positions shown; findings below may reference images not displayed]

FINDINGS: There is a nasogastric tube with tip in the stomach.
There is a ET tube with tip above the carina.  Left-sided chest
tube is noted.  Heart size is normal.  Right pleural effusion and
interstitial edema is again noted.  Bilateral areas of atelectasis
are unchanged.  There are multiple left posterior rib fracture
deformities.
IMPRESSION: 1.  Stable exam.
2.  No significant change in aeration to the lungs.

## 2013-06-30 IMAGING — CR DG CHEST 1V PORT
1 series · 1 of 1 positions shown · non-contrast
Comparison: 04/09/2012

CLINICAL DATA: Ventilator dependence.

PORTABLE CHEST - 1 VIEW

[AP]
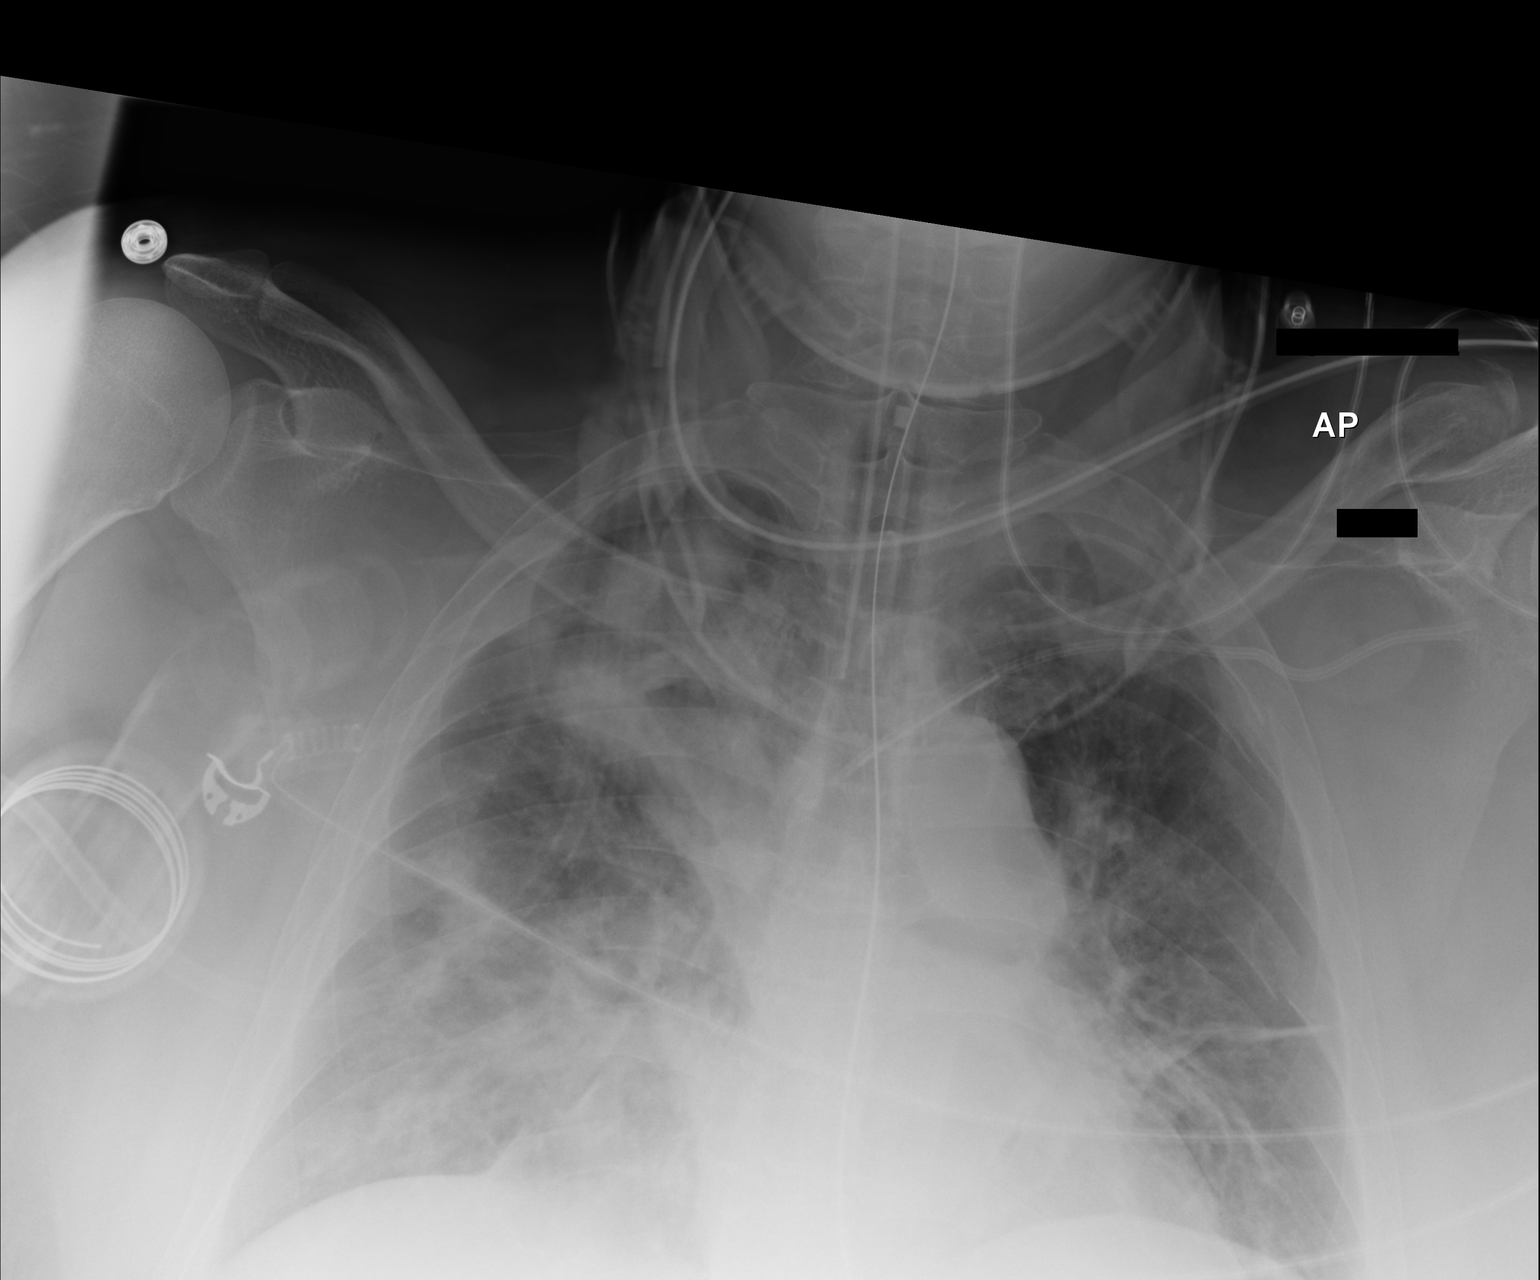

[1 of 1 positions shown; findings below may reference images not displayed]

FINDINGS: 8344 hours.  Endotracheal tube tip is approximately
cm above the base of the carina. The NG tube passes into the
stomach although the distal tip position is not included on the
film.  Left subclavian central line tip projects at the innominate
vein confluence.  Left chest tube has been removed in the interval.
The bibasilar atelectasis persist.  There is patchy airspace
disease in the right upper lung with some probable fluid in the
minor fissure.
IMPRESSION: Interval removal of left chest tube without evidence for residual
pneumothorax.

Slight increase in patchy airspace disease in the right upper lobe.

## 2013-07-01 IMAGING — CR DG CHEST 1V PORT
1 series · 1 of 1 positions shown · non-contrast
Comparison: 04/10/2012

CLINICAL DATA: Respiratory failure

PORTABLE CHEST - 1 VIEW

[AP]
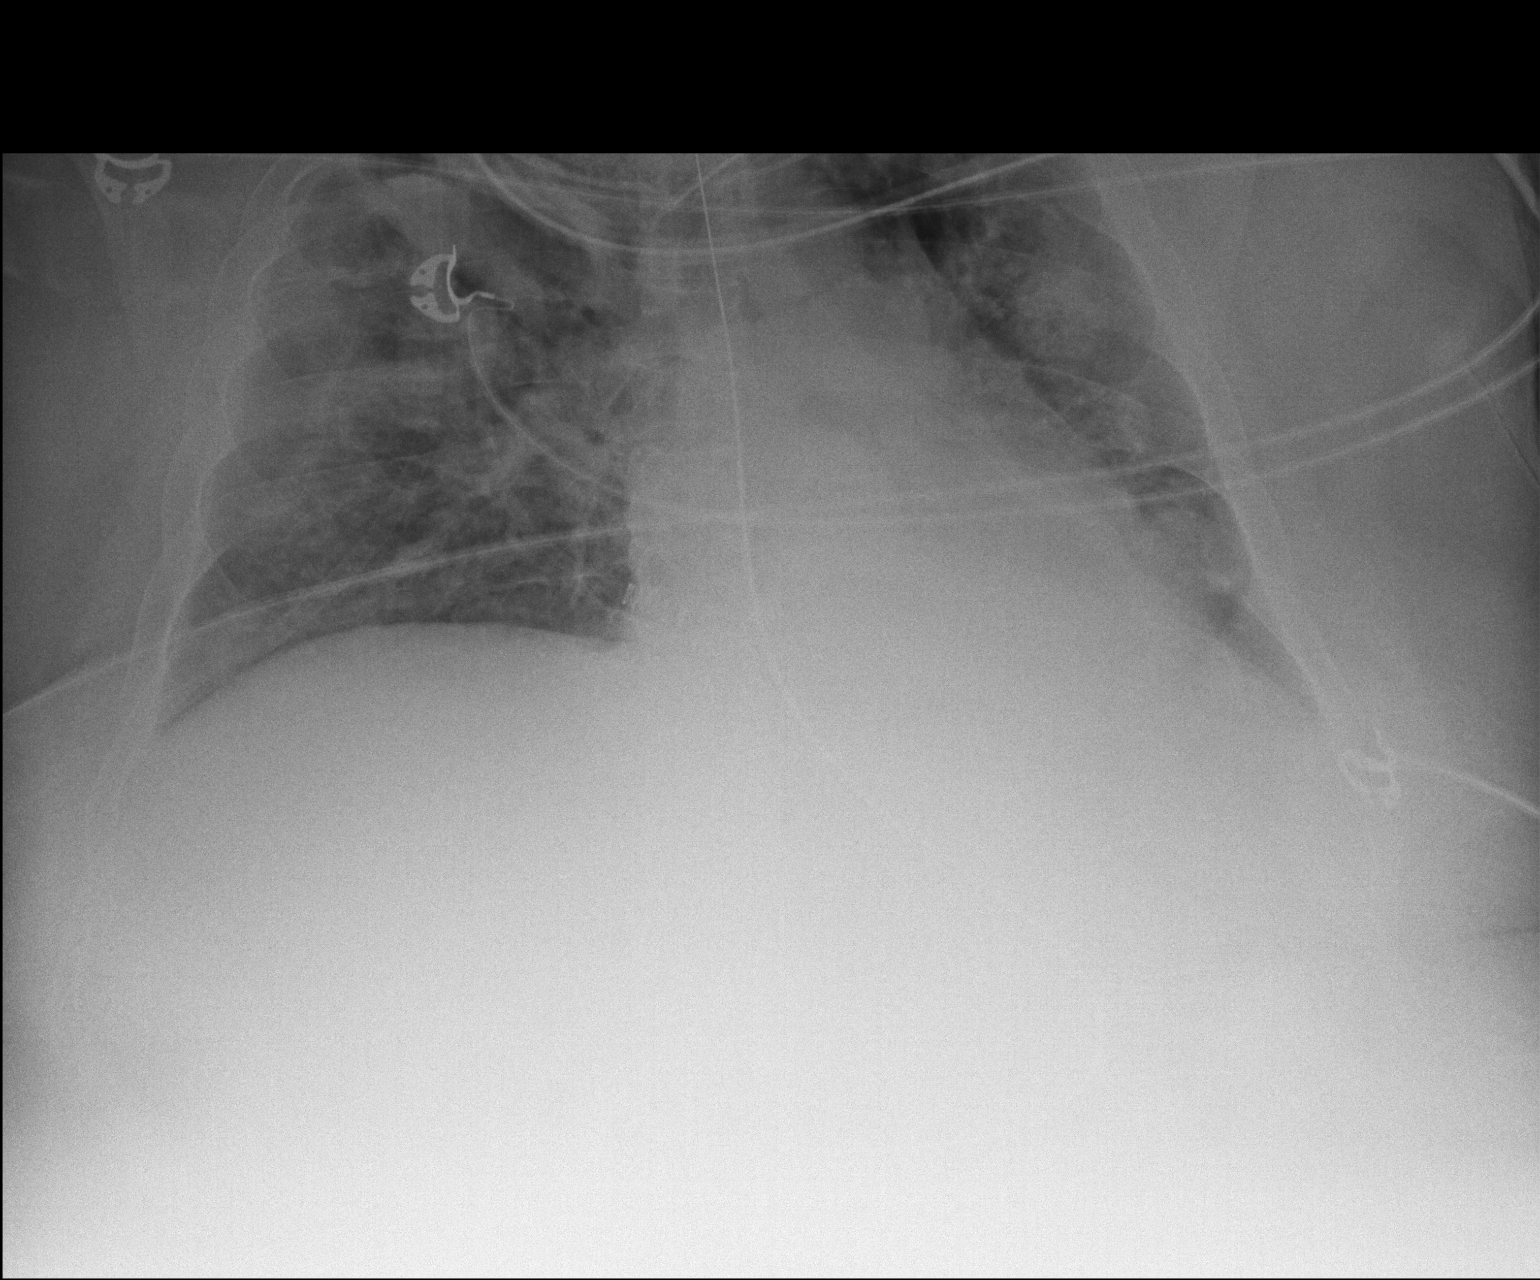

[1 of 1 positions shown; findings below may reference images not displayed]

FINDINGS: Cardiomediastinal silhouette is stable.  Stable
endotracheal and NG tube position.  There is no diagnostic
pneumothorax.  Patchy airspace disease in the right upper lobe
again noted.  Central vascular congestion without convincing
pulmonary edema.  Stable left subclavian central line position.
There is new nodular patchy airspace disease left midlung and
lingula.
IMPRESSION: Stable endotracheal and NG tube position.  There is no diagnostic
pneumothorax.  Patchy airspace disease in the right upper lobe
again noted.  Central vascular congestion without convincing
pulmonary edema.  Stable left subclavian central line position.
There is new nodular patchy airspace disease left midlung and
lingula.

## 2014-08-23 ENCOUNTER — Other Ambulatory Visit: Payer: Self-pay | Admitting: Physician Assistant

## 2014-08-23 ENCOUNTER — Ambulatory Visit
Admission: RE | Admit: 2014-08-23 | Discharge: 2014-08-23 | Disposition: A | Discharge status: Home / Self Care | Payer: Medicaid Other | Source: Ambulatory Visit | Attending: Physician Assistant | Admitting: Physician Assistant

## 2014-08-23 DIAGNOSIS — W19XXXD Unspecified fall, subsequent encounter: Secondary | ICD-10-CM

## 2014-08-23 DIAGNOSIS — M79675 Pain in left toe(s): Secondary | ICD-10-CM

## 2014-09-29 ENCOUNTER — Ambulatory Visit (INDEPENDENT_AMBULATORY_CARE_PROVIDER_SITE_OTHER): Payer: Medicaid Other | Admitting: Podiatry

## 2014-09-29 ENCOUNTER — Encounter: Payer: Self-pay | Admitting: Podiatry

## 2014-09-29 VITALS — BP 139/76 | HR 89 | Resp 16

## 2014-09-29 DIAGNOSIS — L600 Ingrowing nail: Secondary | ICD-10-CM
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
## 2014-09-29 NOTE — Progress Notes (Signed)
Subjective:     Patient ID: Miguel Brady, male   DOB: September 19, 1960, 54 y.o.   MRN: [id number]  HPI patient states that he traumatized his left big toenail and that it has turned dark and he's very worried about this. States he is a diabetic and has extreme obesity and smokes which are all complicated factors   Review of Systems  All other systems reviewed and are negative.      Objective:   Physical Exam  Constitutional: He is oriented to person, place, and time.  Cardiovascular: Intact distal pulses.   Musculoskeletal: Normal range of motion.  Neurological: He is oriented to person, place, and time.  Skin: Skin is warm and dry.  Nursing note and vitals reviewed.  neurovascular status is found to be intact with muscle strength adequate and range of motion subtalar midtarsal joint moderately diminished. Patient has depression of the arch noted bilateral and did have adequate sharp dull and vibratory. Left hallux nail is darkened but it is not loose and I did not note any drainage or proximal edema erythema     Assessment:     Traumatized ingrown toenail deformity left hallux with probable dried blood under the surface    Plan:     H&P and condition explained to patient and at this point I do not recommend removal since it's not sore it is not movable with no indications of infection. I explained may go to this and if it does I need to see him immediately if not it may take 6 months to a year for the nail to grow out

## 2014-12-12 ENCOUNTER — Other Ambulatory Visit: Payer: Self-pay | Admitting: Physician Assistant

## 2014-12-12 ENCOUNTER — Ambulatory Visit
Admission: RE | Admit: 2014-12-12 | Discharge: 2014-12-12 | Disposition: A | Discharge status: Home / Self Care | Payer: Medicaid Other | Source: Ambulatory Visit | Attending: Pain Medicine | Admitting: Pain Medicine

## 2014-12-12 ENCOUNTER — Other Ambulatory Visit: Payer: Self-pay | Admitting: Pain Medicine

## 2014-12-12 DIAGNOSIS — M25562 Pain in left knee: Secondary | ICD-10-CM

## 2015-01-11 ENCOUNTER — Ambulatory Visit: Payer: Medicaid Other | Admitting: Neurology

## 2015-01-24 ENCOUNTER — Ambulatory Visit: Payer: Medicaid Other | Admitting: Neurology

## 2015-01-24 ENCOUNTER — Ambulatory Visit (INDEPENDENT_AMBULATORY_CARE_PROVIDER_SITE_OTHER): Payer: Medicaid Other | Admitting: Neurology

## 2015-01-24 ENCOUNTER — Encounter: Payer: Self-pay | Admitting: Neurology

## 2015-01-24 VITALS — BP 102/66 | HR 91 | Ht 74.0 in | Wt 291.0 lb

## 2015-01-24 DIAGNOSIS — I6350 Cerebral infarction due to unspecified occlusion or stenosis of unspecified cerebral artery: Secondary | ICD-10-CM

## 2015-01-24 DIAGNOSIS — H539 Unspecified visual disturbance: Secondary | ICD-10-CM | POA: Diagnosis not present

## 2015-01-24 DIAGNOSIS — R42 Dizziness and giddiness: Secondary | ICD-10-CM | POA: Diagnosis not present

## 2015-01-24 NOTE — Progress Notes (Addendum)
[facility]    Provider:  Dr Jaynee Eagles Referring Provider: Benito Mccreedy, MD Primary Care Physician:  Benito Mccreedy, MD  CC:  dizziness  HPI:  Miguel Brady is a 54 y.o. male here as a referral from Dr. Vista Lawman for dizziness. PMHx DM2, HTN, HLD, TBI, CHF. Started a month ago. Started bumping into things. Room spinning on movement, sitting or leaning back on the bed. No nausea, no vomiting. Vertigo lasted. Lasted 10-12 hours for 10 days and slowly improved. A foot on the floor helped while he was in bed. Sleep helped. No headaches, no decreased hearing. It is improving. He was given meclizine which did not help. Made him too tired. No inciting factors, no new medications, no illnesses previous to vertigo onset. He changed his diet, he eats fish and vegetables. Has not had the vertigo today or yesterday, last time was last week. He has new vision changes, blurry vision persists/worsening.  Reviewed notes, labs and imaging from outside physicians, which showed:  TSH within normal limits. BU 18 and creatinine of 0.75 liver function tests within normal limits.  Review of Systems: Patient complains of symptoms per HPI as well as the following symptoms: Sensation, dizziness, depression. Pertinent negatives per HPI. All others negative.   History   Social History  . Marital Status: Single    Spouse Name: N/A  . Number of Children: 0  . Years of Education: college   Occupational History  .      disabled   Social History Main Topics  . Smoking status: Current Every Day Smoker -- 0.50 packs/day for 30 years    Types: Cigarettes    Last Attempt to Quit: 04/02/2012  . Smokeless tobacco: Never Used  . Alcohol Use: No     Comment: OCCASIONAL  . Drug Use: No  . Sexual Activity: Not on file   Other Topics Concern  . Not on file   Social History Narrative   Patient lives at home alone. Patient is single.   Disabled.   Education college.   Left handed.   Caffeine one soda daily.    Family History  Problem Relation Age of Onset  . Cancer Father     Past Medical History  Diagnosis Date  . Obesity   . Hypertension   . Diabetes mellitus   . Headache(784.0)   . TOBACCO ABUSE 12/23/2008  . 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 GERD 05/05/2007  . TRIGGER FINGER 05/05/2007    Past Surgical History  Procedure Laterality Date  . External fixation pelvis  04/03/2012    Procedure: EXTERNAL FIXATION PELVIS;  Surgeon: Rozanna Box, MD;  Location: [place];  Service: Orthopedics;;  . External fixation leg  04/03/2012    Procedure: EXTERNAL FIXATION LEG;  Surgeon: Rozanna Box, MD;  Location: [place];  Service: Orthopedics;  Laterality: Left;  Left femur  . Chest tube insertion  04/03/2012    Procedure: CHEST TUBE INSERTION;  Surgeon: Zenovia Jarred, MD;  Location: [place];  Service: General;  Laterality: Left;  . Incision and drainage of wound  04/03/2012    Procedure: IRRIGATION AND DEBRIDEMENT WOUND;  Surgeon: Otilio Connors, MD;  Location: [place];  Service: Neurosurgery;  Laterality: N/A;  Frontal.  . Tibia im nail insertion  04/07/2012    Procedure: INTRAMEDULLARY (IM) NAIL TIBIAL;  Surgeon: Rozanna Box, MD;  Location: [place];  Service: Orthopedics;  Laterality: Left;  . Femur im nail  04/07/2012    Procedure:  INTRAMEDULLARY (IM) NAIL FEMORAL;  Surgeon: Rozanna Box, MD;  Location: [place];  Service: Orthopedics;  Laterality: Left;  . Orif patella  04/07/2012    Procedure: OPEN REDUCTION INTERNAL (ORIF) FIXATION PATELLA;  Surgeon: Rozanna Box, MD;  Location: [place];  Service: Orthopedics;  Laterality: Left;  . Orif pelvic fracture  04/07/2012    Procedure: OPEN REDUCTION INTERNAL FIXATION (ORIF) PELVIC FRACTURE;  Surgeon: Rozanna Box, MD;  Location: [place];  Service: Orthopedics;  Laterality: N/A;  Right and left sacroiliac screw pinning,Irrigation and debridebridement open tibia and femur,removal external fixator.  . Flexible bronchoscopy   04/07/2012    Procedure: FLEXIBLE BRONCHOSCOPY;  Surgeon: Zenovia Jarred, MD;  Location: [place];  Service: General;;  START TIME=1645 END TIME=1700  . Cholecystectomy  07/28/2012    Procedure: LAPAROSCOPIC CHOLECYSTECTOMY;  Surgeon: Harl Bowie, MD;  Location: [facility];  Service: General;  Laterality: N/A;    Current Outpatient Prescriptions  Medication Sig Dispense Refill  . amLODipine (NORVASC) 10 MG tablet Take 10 mg by mouth daily.    [name] aspirin EC 81 MG tablet Take 81 mg by mouth daily.      . chlorzoxazone (PARAFON) 500 MG tablet Take by mouth 4 (four) times daily as needed for muscle spasms.    . cholecalciferol (VITAMIN D) 1000 UNITS tablet Take 1,000 Units by mouth daily.    . citalopram (CELEXA) 20 MG tablet Take 20 mg by mouth daily.    . furosemide (LASIX) 20 MG tablet Take 20 mg by mouth.    . Gabapentin, Once-Daily, (GRALISE PO) Take by mouth.    [name] glucose blood test strip Use as instructed 100 each 12  . lisinopril (PRINIVIL,ZESTRIL) 5 MG tablet Take 5 mg by mouth 2 (two) times daily.    . metFORMIN (GLUCOPHAGE) 500 MG tablet Take by mouth 2 (two) times daily with a meal.    . Oxycodone HCl 10 MG TABS Take 1 tablet (10 mg total) by mouth every 6 (six) hours as needed. (Patient taking differently: Take 15 mg by mouth every 6 (six) hours as needed. ) 120 tablet 0  . pantoprazole (PROTONIX) 40 MG tablet Take 40 mg by mouth daily.    [name] POTASSIUM CHLORIDE PO Take by mouth.    . pravastatin (PRAVACHOL) 10 MG tablet Take 10 mg by mouth daily. Patient unsure of dose     No current facility-administered medications for this visit.    Allergies as of 01/24/2015 - Review Complete 01/24/2015  Allergen Reaction Noted  . Shellfish allergy Anaphylaxis 04/03/2012  . Penicillins Itching 01/27/2009    Vitals: BP 102/66 mmHg  Pulse 91  Ht 6\' 2"  (1.88 m)  Wt 291 lb (131.997 kg)  BMI 37.35 kg/m2 Last Weight:  Wt Readings from Last 1 Encounters:  01/24/15 291 lb (131.997 kg)    Last Height:   Ht Readings from Last 1 Encounters:  01/24/15 6\' 2"  (1.88 m)    Physical exam: Exam: Gen: NAD, conversant, well nourised, obese, well groomed                     CV: RRR, no MRG. No Carotid Bruits. No peripheral edema, warm, nontender Eyes: No exudates or hemorrhage  Neuro: Detailed Neurologic Exam  Speech:    Speech is normal; fluent and spontaneous with normal comprehension.  Cognition:    The patient is oriented to person, place, and time;     recent and remote memory intact;  language fluent;     normal attention, concentration,     fund of knowledge Cranial Nerves:    The pupils are equal, round, and reactive to light. The fundi are normal and spontaneous venous pulsations are present. Visual fields are full to finger confrontation. Extraocular movements are intact. Trigeminal sensation is intact and the muscles of mastication are normal. The face is symmetric. The palate elevates in the midline. Hearing intact. Voice is normal. Shoulder shrug is normal. The tongue has normal motion without fasciculations.   Coordination:    Normal finger to nose, difficulty due to weakness and body habitus on HTSn. Normal rapid alternating movements.   Gait:    Antalgic. With a cane  Motor Observation:    No asymmetry, no atrophy, and no involuntary movements noted. Tone:    Normal muscle tone.    Posture:    Posture is normal. normal erect    Strength: Left leg weakness due to previous pelvic fracture otherwise intact.      Sensation: intact to LT     Reflex Exam:  DTR's:    Deep tendon reflexes in the upper and lower extremities arabsent achilles  Toes:    The toes are downgoing bilaterally.   Clonus:    Clonus is absent.    Assessment/Plan:  54 year old male with vertigo. Appears to be for peripheral however there is some unusual qualities including vertigo lasting 10-12 hours, and patient complaining of vision changes since onset. We'll order MRI  of the brain to ensure that there is been no central causes of vertigo.    Sarina Ill, MD  [facility] [address] [place], [place] [address]  Phone [phone] Fax [phone]

## 2015-01-24 NOTE — Patient Instructions (Signed)
Overall you are doing fairly well but I do want to suggest a few things today:   Remember to drink plenty of fluid, eat healthy meals and do not skip any meals. Try to eat protein with a every meal and eat a healthy snack such as fruit or nuts in between meals. Try to keep a regular sleep-wake schedule and try to exercise daily, particularly in the form of walking, 20-30 minutes a day, if you can.   As far as diagnostic testing: MRi of the brain  I would like to see you back in 6 months, sooner if we need to. Please call us with any interim questions, concerns, problems, updates or refill requests.   Please also call us for any test results so we can go over those with you on the phone.  My clinical assistant and will answer any of your questions and relay your messages to me and also relay most of my messages to you.   Our phone number is [phone]. We also have an after hours call service for urgent matters and there is a physician on-call for urgent questions. For any emergencies you know to call 911 or go to the nearest emergency room

## 2015-01-25 ENCOUNTER — Other Ambulatory Visit: Payer: Self-pay | Admitting: Neurology

## 2015-01-25 DIAGNOSIS — Z139 Encounter for screening, unspecified: Secondary | ICD-10-CM

## 2015-01-30 ENCOUNTER — Other Ambulatory Visit: Payer: Medicaid Other

## 2015-01-30 ENCOUNTER — Ambulatory Visit
Admission: RE | Admit: 2015-01-30 | Discharge: 2015-01-30 | Disposition: A | Discharge status: Home / Self Care | Payer: Medicaid Other | Source: Ambulatory Visit | Attending: Neurology | Admitting: Neurology

## 2015-01-30 ENCOUNTER — Other Ambulatory Visit: Payer: Self-pay | Admitting: Neurology

## 2015-01-30 DIAGNOSIS — Z77018 Contact with and (suspected) exposure to other hazardous metals: Secondary | ICD-10-CM

## 2015-02-11 ENCOUNTER — Ambulatory Visit
Admission: RE | Admit: 2015-02-11 | Discharge: 2015-02-11 | Disposition: A | Discharge status: Home / Self Care | Payer: Medicaid Other | Source: Ambulatory Visit | Attending: Neurology | Admitting: Neurology

## 2015-02-11 DIAGNOSIS — R42 Dizziness and giddiness: Secondary | ICD-10-CM

## 2015-02-11 DIAGNOSIS — I6350 Cerebral infarction due to unspecified occlusion or stenosis of unspecified cerebral artery: Secondary | ICD-10-CM

## 2015-02-11 DIAGNOSIS — H539 Unspecified visual disturbance: Secondary | ICD-10-CM

## 2015-02-14 ENCOUNTER — Telehealth: Payer: Self-pay

## 2015-02-14 NOTE — Telephone Encounter (Signed)
VM left to let patient know the MRi of his brain was Normal. There was some chronic right maxillary sinusitis which has improved since last scan per Dr. Jaynee Eagles.  Patient has been asked to call office back

## 2015-02-14 NOTE — Telephone Encounter (Signed)
Patient returned call, please call and advise.

## 2015-02-14 NOTE — Telephone Encounter (Signed)
Patient informed of Normal MRI and the chronic right maxillary sinusitis. He has not questions at this time.

## 2015-04-02 ENCOUNTER — Encounter (HOSPITAL_COMMUNITY): Payer: Self-pay | Admitting: Emergency Medicine

## 2015-04-02 ENCOUNTER — Emergency Department (HOSPITAL_COMMUNITY)
Admission: EM | Admit: 2015-04-02 | Discharge: 2015-04-02 | Disposition: A | Discharge status: Home / Self Care | Payer: Medicaid Other | Attending: Emergency Medicine | Admitting: Emergency Medicine

## 2015-04-02 DIAGNOSIS — Z880 Allergy status to penicillin: Secondary | ICD-10-CM | POA: Diagnosis not present

## 2015-04-02 DIAGNOSIS — J45909 Unspecified asthma, uncomplicated: Secondary | ICD-10-CM | POA: Insufficient documentation

## 2015-04-02 DIAGNOSIS — Y9289 Other specified places as the place of occurrence of the external cause: Secondary | ICD-10-CM | POA: Insufficient documentation

## 2015-04-02 DIAGNOSIS — Z7982 Long term (current) use of aspirin: Secondary | ICD-10-CM | POA: Insufficient documentation

## 2015-04-02 DIAGNOSIS — Z720 Tobacco use: Secondary | ICD-10-CM | POA: Diagnosis not present

## 2015-04-02 DIAGNOSIS — E119 Type 2 diabetes mellitus without complications: Secondary | ICD-10-CM | POA: Insufficient documentation

## 2015-04-02 DIAGNOSIS — Y998 Other external cause status: Secondary | ICD-10-CM | POA: Insufficient documentation

## 2015-04-02 DIAGNOSIS — W57XXXA Bitten or stung by nonvenomous insect and other nonvenomous arthropods, initial encounter: Secondary | ICD-10-CM | POA: Insufficient documentation

## 2015-04-02 DIAGNOSIS — Z8739 Personal history of other diseases of the musculoskeletal system and connective tissue: Secondary | ICD-10-CM | POA: Diagnosis not present

## 2015-04-02 DIAGNOSIS — K219 Gastro-esophageal reflux disease without esophagitis: Secondary | ICD-10-CM | POA: Insufficient documentation

## 2015-04-02 DIAGNOSIS — Z79899 Other long term (current) drug therapy: Secondary | ICD-10-CM | POA: Insufficient documentation

## 2015-04-02 DIAGNOSIS — E669 Obesity, unspecified: Secondary | ICD-10-CM | POA: Insufficient documentation

## 2015-04-02 DIAGNOSIS — I10 Essential (primary) hypertension: Secondary | ICD-10-CM | POA: Insufficient documentation

## 2015-04-02 DIAGNOSIS — S70361A Insect bite (nonvenomous), right thigh, initial encounter: Secondary | ICD-10-CM | POA: Insufficient documentation

## 2015-04-02 DIAGNOSIS — Y9389 Activity, other specified: Secondary | ICD-10-CM | POA: Diagnosis not present

## 2015-04-02 NOTE — Discharge Instructions (Signed)
The insect bite does not appear infected. You may use cool compresses, ibuprofen, or Benadryl as needed for itching, or pain  Insect Bite Mosquitoes, flies, fleas, bedbugs, and other insects can bite. Insect bites are different from insect stings. The bite may be red, puffy (swollen), and itchy for 2 to 4 days. Most bites get better on their own. HOME CARE   Do not scratch the bite.  Keep the bite clean and dry. Wash the bite with soap and water.  Put ice on the bite.  Put ice in a plastic bag.  Place a towel between your skin and the bag.  Leave the ice on for 20 minutes, 4 times a day. Do this for the first 2 to 3 days, or as told by your doctor.  You may use medicated lotions or creams to lessen itching as told by your doctor.  Only take medicines as told by your doctor.  If you are given medicines (antibiotics), take them as told. Finish them even if you start to feel better. You may need a tetanus shot if:  You cannot remember when you had your last tetanus shot.  You have never had a tetanus shot.  The injury broke your skin. If you need a tetanus shot and you choose not to have one, you may get tetanus. Sickness from tetanus can be serious. GET HELP RIGHT AWAY IF:   You have more pain, redness, or puffiness.  You see a red line on the skin coming from the bite.  You have a fever.  You have joint pain.  You have a headache or neck pain.  You feel weak.  You have a rash.  You have chest pain, or you are short of breath.  You have belly (abdominal) pain.  You feel sick to your stomach (nauseous) or throw up (vomit).  You feel very tired or sleepy. MAKE SURE YOU:   Understand these instructions.  Will watch your condition.  Will get help right away if you are not doing well or get worse. Document Released: 08/30/2000 Document Revised: 11/25/2011 Document Reviewed: 04/03/2011 [facility] Patient Information 2015 [place], [place]. This information is not  intended to replace advice given to you by your health care provider. Make sure you discuss any questions you have with your health care provider.

## 2015-04-02 NOTE — ED Provider Notes (Signed)
CSN: [id number]     Arrival date & time 04/02/15  4580 History   First MD Initiated Contact with Patient 04/02/15 0900     Chief Complaint  Patient presents with  . Insect Bite  . Leg Pain      HPI  Impression presents evaluation of a "bug bite" on his right lower thigh. He states his itching and "I can't get anything to come out of it". He's been squeezing it, scratching, he applied limited juice, and to stay. No drainage. No redness. Overall he feels well. Minimal itch.  Past Medical History  Diagnosis Date  . Obesity   . Hypertension   . Diabetes mellitus   . Headache(784.0)   . TOBACCO ABUSE 12/23/2008  . 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 GERD 05/05/2007  . TRIGGER FINGER 05/05/2007   Past Surgical History  Procedure Laterality Date  . External fixation pelvis  04/03/2012    Procedure: EXTERNAL FIXATION PELVIS;  Surgeon: Rozanna Box, MD;  Location: [place];  Service: Orthopedics;;  . External fixation leg  04/03/2012    Procedure: EXTERNAL FIXATION LEG;  Surgeon: Rozanna Box, MD;  Location: [place];  Service: Orthopedics;  Laterality: Left;  Left femur  . Chest tube insertion  04/03/2012    Procedure: CHEST TUBE INSERTION;  Surgeon: Zenovia Jarred, MD;  Location: [place];  Service: General;  Laterality: Left;  . Incision and drainage of wound  04/03/2012    Procedure: IRRIGATION AND DEBRIDEMENT WOUND;  Surgeon: Otilio Connors, MD;  Location: [place];  Service: Neurosurgery;  Laterality: N/A;  Frontal.  . Tibia im nail insertion  04/07/2012    Procedure: INTRAMEDULLARY (IM) NAIL TIBIAL;  Surgeon: Rozanna Box, MD;  Location: [place];  Service: Orthopedics;  Laterality: Left;  . Femur im nail  04/07/2012    Procedure: INTRAMEDULLARY (IM) NAIL FEMORAL;  Surgeon: Rozanna Box, MD;  Location: [place];  Service: Orthopedics;  Laterality: Left;  . Orif patella  04/07/2012    Procedure: OPEN REDUCTION INTERNAL (ORIF) FIXATION PATELLA;  Surgeon: Rozanna Box, MD;  Location: [place];   Service: Orthopedics;  Laterality: Left;  . Orif pelvic fracture  04/07/2012    Procedure: OPEN REDUCTION INTERNAL FIXATION (ORIF) PELVIC FRACTURE;  Surgeon: Rozanna Box, MD;  Location: [place];  Service: Orthopedics;  Laterality: N/A;  Right and left sacroiliac screw pinning,Irrigation and debridebridement open tibia and femur,removal external fixator.  . Flexible bronchoscopy  04/07/2012    Procedure: FLEXIBLE BRONCHOSCOPY;  Surgeon: Zenovia Jarred, MD;  Location: [place];  Service: General;;  START TIME=1645 END TIME=1700  . Cholecystectomy  07/28/2012    Procedure: LAPAROSCOPIC CHOLECYSTECTOMY;  Surgeon: Harl Bowie, MD;  Location: [facility];  Service: General;  Laterality: N/A;   Family History  Problem Relation Age of Onset  . Cancer Father    History  Substance Use Topics  . Smoking status: Current Every Day Smoker -- 0.50 packs/day for 30 years    Types: Cigarettes    Last Attempt to Quit: 04/02/2012  . Smokeless tobacco: Never Used  . Alcohol Use: No     Comment: OCCASIONAL    Review of Systems  Constitutional: Negative for fever, chills, diaphoresis, appetite change and fatigue.  HENT: Negative for mouth sores, sore throat and trouble swallowing.   Eyes: Negative for visual disturbance.  Respiratory: Negative for cough, chest tightness, shortness of breath and wheezing.   Cardiovascular: Negative for chest pain.  Gastrointestinal: Negative  for nausea, vomiting, abdominal pain, diarrhea and abdominal distention.  Endocrine: Negative for polydipsia, polyphagia and polyuria.  Genitourinary: Negative for dysuria, frequency and hematuria.  Musculoskeletal: Negative for gait problem.  Skin: Negative for color change, pallor and rash.  Neurological: Negative for dizziness, syncope, light-headedness and headaches.  Hematological: Does not bruise/bleed easily.  Psychiatric/Behavioral: Negative for behavioral problems and confusion.      Allergies  Shellfish allergy and  Penicillins  Home Medications   Prior to Admission medications   Medication Sig Start Date End Date Taking? Authorizing Provider  amLODipine (NORVASC) 10 MG tablet Take 10 mg by mouth daily.    Historical Provider, MD  aspirin EC 81 MG tablet Take 81 mg by mouth daily.      Historical Provider, MD  chlorzoxazone (PARAFON) 500 MG tablet Take by mouth 4 (four) times daily as needed for muscle spasms.    Historical Provider, MD  cholecalciferol (VITAMIN D) 1000 UNITS tablet Take 1,000 Units by mouth daily.    Historical Provider, MD  citalopram (CELEXA) 20 MG tablet Take 20 mg by mouth daily.    Historical Provider, MD  furosemide (LASIX) 20 MG tablet Take 20 mg by mouth.    Historical Provider, MD  Gabapentin, Once-Daily, (GRALISE PO) Take by mouth.    Historical Provider, MD  glucose blood test strip Use as instructed 10/09/12   Meredith Staggers, MD  lisinopril (PRINIVIL,ZESTRIL) 5 MG tablet Take 5 mg by mouth 2 (two) times daily.    Historical Provider, MD  metFORMIN (GLUCOPHAGE) 500 MG tablet Take by mouth 2 (two) times daily with a meal.    Historical Provider, MD  Oxycodone HCl 10 MG TABS Take 1 tablet (10 mg total) by mouth every 6 (six) hours as needed. Patient taking differently: Take 15 mg by mouth every 6 (six) hours as needed.  05/14/13   Meredith Staggers, MD  pantoprazole (PROTONIX) 40 MG tablet Take 40 mg by mouth daily.    Historical Provider, MD  POTASSIUM CHLORIDE PO Take by mouth.    Historical Provider, MD  pravastatin (PRAVACHOL) 10 MG tablet Take 10 mg by mouth daily. Patient unsure of dose    Historical Provider, MD   BP 136/71 mmHg  Pulse 88  Temp(Src) 98.6 F (37 C) (Oral)  Resp 22  Ht 6\' 2"  (1.88 m)  Wt 290 lb (131.543 kg)  BMI 37.22 kg/m2  SpO2 99% Physical Exam  Constitutional: He is oriented to person, place, and time. He appears well-developed and well-nourished. No distress.  HENT:  Head: Normocephalic.  Eyes: Conjunctivae are normal. Pupils are equal,  round, and reactive to light. No scleral icterus.  Neck: Normal range of motion. Neck supple. No thyromegaly present.  Cardiovascular: Normal rate and regular rhythm.  Exam reveals no gallop and no friction rub.   No murmur heard. Pulmonary/Chest: Effort normal and breath sounds normal. No respiratory distress. He has no wheezes. He has no rales.  Abdominal: Soft. Bowel sounds are normal. He exhibits no distension. There is no tenderness. There is no rebound.  Musculoskeletal: Normal range of motion.  Neurological: He is alert and oriented to person, place, and time.  Skin: Skin is warm and dry. No rash noted.     Psychiatric: He has a normal mood and affect. His behavior is normal.    ED Course  Procedures (including critical care time) Labs Review Labs Reviewed - No data to display  Imaging Review No results found.   EKG Interpretation None  MDM   Final diagnoses:  Insect bite    Minimal abnormality of the skin. No surrounding erythema or induration to suggest acute infection.    Tanna Furry, MD 04/02/15 [phone]

## 2015-04-02 NOTE — ED Notes (Signed)
Pt c/o stung by bee on neck Sunday or Monday, pt used lemon juice. Pt noticed a bug bite on his right thigh area. Pt tried using lemon juice and toothpaste without relief. Area red and swollen.

## 2015-06-28 ENCOUNTER — Encounter: Payer: Self-pay | Admitting: Gastroenterology

## 2015-06-28 NOTE — Telephone Encounter (Signed)
Received a fax referral for Miguel Brady for a screening colonoscopy. Miguel Brady is due for a recall colon in 05/2016. He was a [facility] Miguel Brady? Should the patient pick a new physician and schedule an office visit or continue with original recall for 05/2016?

## 2015-06-28 NOTE — Telephone Encounter (Signed)
error 

## 2015-08-16 ENCOUNTER — Ambulatory Visit (AMBULATORY_SURGERY_CENTER): Payer: Self-pay

## 2015-08-16 VITALS — Ht 71.0 in | Wt 292.8 lb

## 2015-08-16 DIAGNOSIS — Z800 Family history of malignant neoplasm of digestive organs: Secondary | ICD-10-CM

## 2015-08-16 MED ORDER — SUPREP BOWEL PREP KIT 17.5-3.13-1.6 GM/177ML PO SOLN: 1.0000 | Freq: Once | ORAL | Status: DC

## 2015-08-16 NOTE — Progress Notes (Signed)
No allergies to eggs or soy No past problems with anesthesia No diet/weight loss meds No home oxygen  Has email and internet on phone only

## 2015-08-30 ENCOUNTER — Ambulatory Visit (AMBULATORY_SURGERY_CENTER): Payer: Medicaid Other | Admitting: Gastroenterology

## 2015-08-30 ENCOUNTER — Encounter: Payer: Self-pay | Admitting: Gastroenterology

## 2015-08-30 VITALS — BP 121/79 | HR 73 | Temp 96.8°F | Resp 16 | Ht 71.0 in | Wt 292.0 lb

## 2015-08-30 DIAGNOSIS — Z1211 Encounter for screening for malignant neoplasm of colon: Secondary | ICD-10-CM | POA: Diagnosis not present

## 2015-08-30 DIAGNOSIS — D124 Benign neoplasm of descending colon: Secondary | ICD-10-CM | POA: Diagnosis not present

## 2015-08-30 DIAGNOSIS — Z800 Family history of malignant neoplasm of digestive organs: Secondary | ICD-10-CM

## 2015-08-30 LAB — GLUCOSE, CAPILLARY
GLUCOSE-CAPILLARY: 81 mg/dL (ref 65–99)
GLUCOSE-CAPILLARY: 143 mg/dL — AB (ref 65–99)
Glucose-Capillary: 78 mg/dL (ref 65–99)

## 2015-08-30 MED ORDER — DEXTROSE 5 % IV SOLN: INTRAVENOUS | Status: DC

## 2015-08-30 MED ORDER — SODIUM CHLORIDE 0.9 % IV SOLN: 500.0000 mL | INTRAVENOUS | Status: DC

## 2015-08-30 NOTE — Patient Instructions (Signed)
Discharge instructions given. Handout on polyps. Resume previous medications. YOU HAD AN ENDOSCOPIC PROCEDURE TODAY AT THE [facility]:   Refer to the procedure report that was given to you for any specific questions about what was found during the examination.  If the procedure report does not answer your questions, please call your gastroenterologist to clarify.  If you requested that your care partner not be given the details of your procedure findings, then the procedure report has been included in a sealed envelope for you to review at your convenience later.  YOU SHOULD EXPECT: Some feelings of bloating in the abdomen. Passage of more gas than usual.  Walking can help get rid of the air that was put into your GI tract during the procedure and reduce the bloating. If you had a lower endoscopy (such as a colonoscopy or flexible sigmoidoscopy) you may notice spotting of blood in your stool or on the toilet paper. If you underwent a bowel prep for your procedure, you may not have a normal bowel movement for a few days.  Please Note:  You might notice some irritation and congestion in your nose or some drainage.  This is from the oxygen used during your procedure.  There is no need for concern and it should clear up in a day or so.  SYMPTOMS TO REPORT IMMEDIATELY:   Following lower endoscopy (colonoscopy or flexible sigmoidoscopy):  Excessive amounts of blood in the stool  Significant tenderness or worsening of abdominal pains  Swelling of the abdomen that is new, acute  Fever of 100F or higher   For urgent or emergent issues, a gastroenterologist can be reached at any hour by calling [phone].   DIET: Your first meal following the procedure should be a small meal and then it is ok to progress to your normal diet. Heavy or fried foods are harder to digest and may make you feel nauseous or bloated.  Likewise, meals heavy in dairy and vegetables can increase bloating.  Drink  plenty of fluids but you should avoid alcoholic beverages for 24 hours.  ACTIVITY:  You should plan to take it easy for the rest of today and you should NOT DRIVE or use heavy machinery until tomorrow (because of the sedation medicines used during the test).    FOLLOW UP: Our staff will call the number listed on your records the next business day following your procedure to check on you and address any questions or concerns that you may have regarding the information given to you following your procedure. If we do not reach you, we will leave a message.  However, if you are feeling well and you are not experiencing any problems, there is no need to return our call.  We will assume that you have returned to your regular daily activities without incident.  If any biopsies were taken you will be contacted by phone or by letter within the next 1-3 weeks.  Please call us at [phone] if you have not heard about the biopsies in 3 weeks.    SIGNATURES/CONFIDENTIALITY: You and/or your care partner have signed paperwork which will be entered into your electronic medical record.  These signatures attest to the fact that that the information above on your After Visit Summary has been reviewed and is understood.  Full responsibility of the confidentiality of this discharge information lies with you and/or your care-partner. 

## 2015-08-30 NOTE — Progress Notes (Signed)
Report to PACU, RN, vss, BBS= Clear.  

## 2015-08-30 NOTE — Progress Notes (Signed)
Pt blood sugar was 78 on admission.  He stated to Randye Lobo, CMA, "I feel a little dizzy".  When IV started, D5W was hung.  Rechecked blood sugar and it was 81.  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  maw

## 2015-08-30 NOTE — Progress Notes (Signed)
Called to room to assist during endoscopic procedure.  Patient ID and intended procedure confirmed with present staff. Received instructions for my participation in the procedure from the performing physician.  

## 2015-08-31 ENCOUNTER — Telehealth: Payer: Self-pay

## 2015-08-31 NOTE — Op Note (Signed)
[place]  [employer]. [place], [address]   COLONOSCOPY PROCEDURE REPORT  PATIENT: Miguel Brady, Miguel Brady  MR#: [id number] BIRTHDATE: [birth year] , 62  yrs. old GENDER: male ENDOSCOPIST: Milus Banister, MD REFERRED [id number] Osei-Bonsu, M.D. PROCEDURE DATE:  08/30/2015 PROCEDURE:   Colonoscopy, screening and Colonoscopy with snare polypectomy First Screening Colonoscopy - Avg.  risk and is 50 yrs.  old or older Yes.  Prior Negative Screening - Now for repeat screening. N/A  History of Adenoma - Now for follow-up colonoscopy & has been > or = to 3 yrs.  N/A  Polyps removed today? Yes ASA CLASS:   Class II INDICATIONS:Screening for colonic neoplasia, FH Colon or Rectal Adenocarcinoma, and Father had colon cancer. MEDICATIONS: Monitored anesthesia care and Propofol 300 mg IV  DESCRIPTION OF PROCEDURE:   After the risks benefits and alternatives of the procedure were thoroughly explained, informed consent was obtained.  The digital rectal exam revealed no abnormalities of the rectum.   The LB [id number]  endoscope was introduced through the anus and advanced to the cecum, which was identified by both the appendix and ileocecal valve. No adverse events experienced.   The quality of the prep was excellent.  The instrument was then slowly withdrawn as the colon was fully examined. Estimated blood loss is zero unless otherwise noted in this procedure report.      COLON FINDINGS: A sessile polyp measuring 4 mm in size was found in the descending colon.  A polypectomy was performed with a cold snare.  The resection was complete, the polyp tissue was completely retrieved and sent to histology.   The examination was otherwise normal.  Retroflexed views revealed no abnormalities. The time to cecum = 2.1 Withdrawal time = 10.7   The scope was withdrawn and the procedure completed. COMPLICATIONS: There were no immediate complications.  ENDOSCOPIC IMPRESSION: 1.    Sessile polyp was found in the descending colon; polypectomy was performed with a cold snare 2.   The examination was otherwise normal  RECOMMENDATIONS: 1.  If the polyp(s) removed today are proven to be adenomatous (pre-cancerous) polyps, you will need a repeat colonoscopy in 5 years.  Otherwise you should continue to follow colorectal cancer screening guidelines for "routine risk" patients with colonoscopy in 10 years.  You will receive a letter within 1-2 weeks with the results of your biopsy as well as final recommendations.  Please call my office if you have not received a letter after 3 weeks.   eSigned:  Milus Banister, MD 08/30/2015 10:51 AM

## 2015-08-31 NOTE — Telephone Encounter (Signed)
  Follow up Call-  No flowsheet data found.   Patient questions:  Do you have a fever, pain , or abdominal swelling? No. Pain Score  0 *  Have you tolerated food without any problems? Yes.    Have you been able to return to your normal activities? Yes.    Do you have any questions about your discharge instructions: Diet   No. Medications  No. Follow up visit  No.  Do you have questions or concerns about your Care? No.  Actions: * If pain score is 4 or above: No action needed, pain <4.   

## 2015-09-05 ENCOUNTER — Encounter: Payer: Self-pay | Admitting: Gastroenterology

## 2015-09-29 ENCOUNTER — Inpatient Hospital Stay (HOSPITAL_COMMUNITY)
Admission: EM | Admit: 2015-09-29 | Discharge: 2015-10-01 | DRG: 603 | Disposition: A | Discharge status: Home / Self Care | Payer: Medicaid Other | Attending: Internal Medicine | Admitting: Internal Medicine

## 2015-09-29 ENCOUNTER — Encounter (HOSPITAL_COMMUNITY): Payer: Self-pay | Admitting: *Deleted

## 2015-09-29 ENCOUNTER — Emergency Department (INDEPENDENT_AMBULATORY_CARE_PROVIDER_SITE_OTHER)
Admission: EM | Admit: 2015-09-29 | Discharge: 2015-09-29 | Disposition: A | Discharge status: Other Acute Inpatient Hospital | Payer: Medicaid Other | Source: Home / Self Care | Attending: Family Medicine | Admitting: Family Medicine

## 2015-09-29 DIAGNOSIS — R220 Localized swelling, mass and lump, head: Secondary | ICD-10-CM | POA: Diagnosis not present

## 2015-09-29 DIAGNOSIS — I10 Essential (primary) hypertension: Secondary | ICD-10-CM | POA: Diagnosis present

## 2015-09-29 DIAGNOSIS — Z8611 Personal history of tuberculosis: Secondary | ICD-10-CM

## 2015-09-29 DIAGNOSIS — Z8614 Personal history of Methicillin resistant Staphylococcus aureus infection: Secondary | ICD-10-CM

## 2015-09-29 DIAGNOSIS — Z800 Family history of malignant neoplasm of digestive organs: Secondary | ICD-10-CM

## 2015-09-29 DIAGNOSIS — Z7984 Long term (current) use of oral hypoglycemic drugs: Secondary | ICD-10-CM

## 2015-09-29 DIAGNOSIS — J45909 Unspecified asthma, uncomplicated: Secondary | ICD-10-CM | POA: Diagnosis present

## 2015-09-29 DIAGNOSIS — K047 Periapical abscess without sinus: Secondary | ICD-10-CM | POA: Diagnosis not present

## 2015-09-29 DIAGNOSIS — K219 Gastro-esophageal reflux disease without esophagitis: Secondary | ICD-10-CM | POA: Diagnosis present

## 2015-09-29 DIAGNOSIS — M79605 Pain in left leg: Secondary | ICD-10-CM | POA: Diagnosis present

## 2015-09-29 DIAGNOSIS — F17210 Nicotine dependence, cigarettes, uncomplicated: Secondary | ICD-10-CM | POA: Diagnosis present

## 2015-09-29 DIAGNOSIS — L03211 Cellulitis of face: Secondary | ICD-10-CM | POA: Diagnosis present

## 2015-09-29 DIAGNOSIS — Z7982 Long term (current) use of aspirin: Secondary | ICD-10-CM

## 2015-09-29 DIAGNOSIS — E785 Hyperlipidemia, unspecified: Secondary | ICD-10-CM | POA: Diagnosis present

## 2015-09-29 DIAGNOSIS — E119 Type 2 diabetes mellitus without complications: Secondary | ICD-10-CM

## 2015-09-29 DIAGNOSIS — Z880 Allergy status to penicillin: Secondary | ICD-10-CM

## 2015-09-29 DIAGNOSIS — L03213 Periorbital cellulitis: Principal | ICD-10-CM | POA: Diagnosis present

## 2015-09-29 DIAGNOSIS — Z91013 Allergy to seafood: Secondary | ICD-10-CM

## 2015-09-29 LAB — COMPREHENSIVE METABOLIC PANEL
ALT: 14 U/L — ABNORMAL LOW (ref 17–63)
ANION GAP: 9 (ref 5–15)
AST: 16 U/L (ref 15–41)
Albumin: 3.3 g/dL — ABNORMAL LOW (ref 3.5–5.0)
Alkaline Phosphatase: 77 U/L (ref 38–126)
BILIRUBIN TOTAL: 0.7 mg/dL (ref 0.3–1.2)
BUN: 8 mg/dL (ref 6–20)
CO2: 27 mmol/L (ref 22–32)
Calcium: 9.2 mg/dL (ref 8.9–10.3)
Chloride: 106 mmol/L (ref 101–111)
Creatinine, Ser: 0.96 mg/dL (ref 0.61–1.24)
Glucose, Bld: 151 mg/dL — ABNORMAL HIGH (ref 65–99)
Potassium: 3.5 mmol/L (ref 3.5–5.1)
Sodium: 142 mmol/L (ref 135–145)
TOTAL PROTEIN: 7 g/dL (ref 6.5–8.1)

## 2015-09-29 LAB — CBC WITH DIFFERENTIAL/PLATELET
BASOS ABS: 0 10*3/uL (ref 0.0–0.1)
BASOS PCT: 0 %
EOS PCT: 1 %
Eosinophils Absolute: 0.1 10*3/uL (ref 0.0–0.7)
HCT: 44.5 % (ref 39.0–52.0)
Hemoglobin: 14.4 g/dL (ref 13.0–17.0)
LYMPHS PCT: 38 %
Lymphs Abs: 4.5 10*3/uL — ABNORMAL HIGH (ref 0.7–4.0)
MCH: 29.3 pg (ref 26.0–34.0)
MCHC: 32.4 g/dL (ref 30.0–36.0)
MCV: 90.4 fL (ref 78.0–100.0)
MONOS PCT: 10 %
Monocytes Absolute: 1.2 10*3/uL — ABNORMAL HIGH (ref 0.1–1.0)
NEUTROS ABS: 6.1 10*3/uL (ref 1.7–7.7)
Neutrophils Relative %: 51 %
PLATELETS: 254 10*3/uL (ref 150–400)
RBC: 4.92 MIL/uL (ref 4.22–5.81)
RDW: 16.3 % — AB (ref 11.5–15.5)
WBC: 12 10*3/uL — ABNORMAL HIGH (ref 4.0–10.5)

## 2015-09-29 NOTE — ED Notes (Signed)
Py has  Pain  And  Swelling  To  The  l  Side  Of face       Pt  Has  Dental  Caries           His  Airway  Is  Intact             He  Is  Speaking in  Complete  sentances         He  Has  Been taking  Amoxicillin    He  Is  A  Diabetic

## 2015-09-29 NOTE — ED Notes (Signed)
The pt has face swellinbg for over opne week.  He has been seen x 2 for the same.  He was seen at [facility] and he was sent here for treatment.  He has a nose sore on the lt side of his nose

## 2015-09-29 NOTE — ED Provider Notes (Signed)
CSN: [id number]     Arrival date & time 09/29/15  1751 History   First MD Initiated Contact with Patient 09/29/15 1944     Chief Complaint  Patient presents with  . Facial Swelling   (Consider location/radiation/quality/duration/timing/severity/associated sxs/prior Treatment) Patient is a 55 y.o. male presenting with tooth pain. The history is provided by the patient.  Dental Pain Location:  Upper and lower Upper teeth location:  14/LU 1st molar Lower teeth location:  20/LL 2nd bicuspid Quality:  Throbbing Severity:  Moderate Onset quality:  Gradual Duration:  10 days Progression:  Unchanged Chronicity:  New Context: abscess, dental caries and poor dentition   Context comment:  Seen 1 week ago by lmd and given amox and taken without relief. Associated symptoms: facial swelling, gum swelling, headaches and neck swelling   Associated symptoms: no fever   Risk factors: diabetes and lack of dental care     Past Medical History  Diagnosis Date  . Obesity   . Hypertension   . Diabetes mellitus   . Headache(784.0)   . TOBACCO ABUSE 12/23/2008  . GERD 05/05/2007  . TRIGGER FINGER 05/05/2007  . REACTIVE AIRWAY DISEASE 12/23/2008    pt denies.  no inhaler  . Hyperlipidemia   . Substance abuse     ETOH  . Tuberculosis     pos PPD  . 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 External fixation pelvis  04/03/2012    Procedure: EXTERNAL FIXATION PELVIS;  Surgeon: Rozanna Box, MD;  Location: [place];  Service: Orthopedics;;  . External fixation leg  04/03/2012    Procedure: EXTERNAL FIXATION LEG;  Surgeon: Rozanna Box, MD;  Location: [place];  Service: Orthopedics;  Laterality: Left;  Left femur  . Chest tube insertion  04/03/2012    Procedure: CHEST TUBE INSERTION;  Surgeon: Zenovia Jarred, MD;   Location: [place];  Service: General;  Laterality: Left;  . Incision and drainage of wound  04/03/2012    Procedure: IRRIGATION AND DEBRIDEMENT WOUND;  Surgeon: Otilio Connors, MD;  Location: [place];  Service: Neurosurgery;  Laterality: N/A;  Frontal.  . Tibia im nail insertion  04/07/2012    Procedure: INTRAMEDULLARY (IM) NAIL TIBIAL;  Surgeon: Rozanna Box, MD;  Location: [place];  Service: Orthopedics;  Laterality: Left;  . Femur im nail  04/07/2012    Procedure: INTRAMEDULLARY (IM) NAIL FEMORAL;  Surgeon: Rozanna Box, MD;  Location: [place];  Service: Orthopedics;  Laterality: Left;  . Orif patella  04/07/2012    Procedure: OPEN REDUCTION INTERNAL (ORIF) FIXATION PATELLA;  Surgeon: Rozanna Box, MD;  Location: [place];  Service: Orthopedics;  Laterality: Left;  . Orif pelvic fracture  04/07/2012    Procedure: OPEN REDUCTION INTERNAL FIXATION (ORIF) PELVIC FRACTURE;  Surgeon: Rozanna Box, MD;  Location: [place];  Service: Orthopedics;  Laterality: N/A;  Right and left sacroiliac screw pinning,Irrigation and debridebridement open tibia and femur,removal external fixator.  . Flexible bronchoscopy  04/07/2012    Procedure: FLEXIBLE BRONCHOSCOPY;  Surgeon: Zenovia Jarred, MD;  Location: [place];  Service: General;;  START TIME=1645 END TIME=1700  . Cholecystectomy  07/28/2012    Procedure: LAPAROSCOPIC CHOLECYSTECTOMY;  Surgeon: Harl Bowie, MD;  Location: [facility];  Service: General;  Laterality: N/A;   Family History  Problem Relation Age of Onset  . Cancer Father   . Colon cancer Father   . Esophageal cancer Neg Hx   . Rectal cancer Neg Hx   . Stomach cancer Neg Hx   . Colon cancer Paternal Uncle     pt thinks 8 pat uncles   Social History  Substance Use Topics  . Smoking status: Current Every Day Smoker -- 0.50 packs/day for 30 years    Types: Cigarettes    Last Attempt to Quit: 04/02/2012  . Smokeless tobacco: Never Used  . Alcohol Use: No     Comment: Pt denies alcohol  consumption for 2 years    Review of Systems  Constitutional: Negative for fever.  HENT: Positive for dental problem and facial swelling. Negative for postnasal drip, rhinorrhea and trouble swallowing.   Respiratory: Negative.   Neurological: Positive for headaches.    Allergies  Shellfish allergy and Penicillins  Home Medications   Prior to Admission medications   Medication Sig Start Date End Date Taking? Authorizing Provider  amLODipine (NORVASC) 10 MG tablet Take 10 mg by mouth daily.    Historical Provider, MD  aspirin EC 81 MG tablet Take 81 mg by mouth daily.      Historical Provider, MD  chlorzoxazone (PARAFON) 500 MG tablet Take by mouth 4 (four) times daily as needed for muscle spasms.    Historical Provider, MD  cholecalciferol (VITAMIN D) 1000 UNITS tablet Take 1,000 Units by mouth daily.    Historical Provider, MD  furosemide (LASIX) 20 MG tablet Take 20 mg by mouth.    Historical Provider, MD  Gabapentin, Once-Daily, (GRALISE PO) Take by mouth.    Historical Provider, MD  glucose blood test strip Use as instructed 10/09/12   Meredith Staggers, MD  lisinopril (PRINIVIL,ZESTRIL) 5 MG tablet Take 5 mg by mouth 2 (two) times daily.    Historical Provider, MD  metFORMIN (GLUCOPHAGE) 500 MG tablet Take by mouth 2 (two) times daily with a meal.    Historical Provider, MD  Oxycodone HCl 10 MG TABS Take 1 tablet (10 mg total) by mouth every 6 (six) hours as needed. Patient taking differently: Take 15 mg by mouth every 6 (six) hours as needed.  05/14/13   Meredith Staggers, MD  pantoprazole (PROTONIX) 40 MG tablet Take 40 mg by mouth daily.    Historical Provider, MD  POTASSIUM CHLORIDE PO Take by mouth.    Historical Provider, MD  pravastatin (PRAVACHOL) 10 MG tablet Take 10 mg by mouth daily. Patient unsure of dose    Historical Provider, MD   Meds Ordered and Administered this Visit  Medications - No data to display  BP 127/85 mmHg  Pulse 76  Temp(Src) 98 F (36.7 C) (Oral)   Resp 16  SpO2 97% No data found.   Physical Exam  Constitutional: He is oriented to person, place, and time. He appears well-developed and well-nourished. He has a sickly appearance.  HENT:  Right Ear: External ear normal.  Left Ear: External ear normal.  Mouth/Throat: Uvula is midline. Abnormal dentition. Dental abscesses and dental caries present.    Left facial sts and tenderness.  Eyes: Pupils are equal, round, and reactive to light.  Neck: Normal range of motion. Neck supple.  Lymphadenopathy:    He has cervical adenopathy.  Neurological: He is alert and oriented to person, place, and time.  Skin: Skin is warm and dry.  Nursing note  and vitals reviewed.   ED Course  Procedures (including critical care time)  Labs Review Labs Reviewed - No data to display  Imaging Review No results found.   Visual Acuity Review  Right Eye Distance:   Left Eye Distance:   Bilateral Distance:    Right Eye Near:   Left Eye Near:    Bilateral Near:         MDM   1. Dental abscess   2. Left facial swelling     Sent for iv abx for dental infection causing facial swelling, unresponsive to amox in diabetic pt.   Billy Fischer, MD 09/29/15 2002

## 2015-09-29 NOTE — ED Notes (Signed)
Pt   Says  He  Must  Take  Someone  Home   Before  He  Goes  To  Hospital             He  Was  Advised  To proceed  By  Shuttle to  [place] states  He  Must  Doe  This  First  Dr  Rosalie Doctor  With pt  As   Well  Pt  Promised  To go to  [place]

## 2015-09-29 NOTE — ED Provider Notes (Signed)
CSN: [id number]     Arrival date & time 09/29/15  2109 History  By signing my name below, I, Hansel Feinstein, attest that this documentation has been prepared under the direction and in the presence of Gloriann Loan, PA-C. Electronically Signed: Hansel Feinstein, ED Scribe. 09/29/2015. 10:17 PM.    Chief Complaint  Patient presents with  . Facial Swelling   The history is provided by the patient. No language interpreter was used.    HPI Comments: Miguel Brady is a 55 y.o. male with h/o obesity, HTN, DM, HLD who presents to the Emergency Department complaining of moderate, worsening, left-sided facial and neck swelling onset 10 days ago with associated left nare pain onset 2 days ago, left eyelid swelling and pain, left eye pain, abdominal pain this morning. Pt states that his symptoms began with a foreign body sensation and pain in his left eye, which have resolved when he began to have the facial and neck swelling. He was seen by his PCP last Friday (8 days ago)  and was dx with an allergic reaction, prescribed Zyrtec and Amoxicillin TID.  He reports he has been compliant. He reports that this treatment alleviated his neck swelling, but has not alleviated his facial swelling. Pt was evaluated earlier today at [facility] for facial swelling and was sent to the ED for further evaluation. Dr. Duanne Limerick note was reviewed. He denies worsened dental pain from baseline (poor dentition), fever, visual disturbance, nausea, emesis, CP, SOB.   Past Medical History  Diagnosis Date  . Obesity   . Hypertension   . Diabetes mellitus   . Headache(784.0)   . TOBACCO ABUSE 12/23/2008  . GERD 05/05/2007  . TRIGGER FINGER 05/05/2007  . REACTIVE AIRWAY DISEASE 12/23/2008    pt denies.  no inhaler  . Hyperlipidemia   . Substance abuse     ETOH  . Tuberculosis     pos PPD  . 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 Neuropathy due to medical condition [facility])      bilateral feet   Past Surgical History  Procedure Laterality Date  . External fixation pelvis  04/03/2012    Procedure: EXTERNAL FIXATION PELVIS;  Surgeon: Rozanna Box, MD;  Location: [place];  Service: Orthopedics;;  . External fixation leg  04/03/2012    Procedure: EXTERNAL FIXATION LEG;  Surgeon: Rozanna Box, MD;  Location: [place];  Service: Orthopedics;  Laterality: Left;  Left femur  . Chest tube insertion  04/03/2012    Procedure: CHEST TUBE INSERTION;  Surgeon: Zenovia Jarred, MD;  Location: [place];  Service: General;  Laterality: Left;  . Incision and drainage of wound  04/03/2012    Procedure: IRRIGATION AND DEBRIDEMENT WOUND;  Surgeon: Otilio Connors, MD;  Location: [place];  Service: Neurosurgery;  Laterality: N/A;  Frontal.  . Tibia im nail insertion  04/07/2012    Procedure: INTRAMEDULLARY (IM) NAIL TIBIAL;  Surgeon: Rozanna Box, MD;  Location: [place];  Service: Orthopedics;  Laterality: Left;  . Femur im nail  04/07/2012    Procedure: INTRAMEDULLARY (IM) NAIL FEMORAL;  Surgeon: Rozanna Box, MD;  Location: [place];  Service: Orthopedics;  Laterality: Left;  . Orif patella  04/07/2012    Procedure: OPEN REDUCTION INTERNAL (ORIF) FIXATION PATELLA;  Surgeon: Rozanna Box, MD;  Location: [place];  Service: Orthopedics;  Laterality: Left;  . Orif pelvic  fracture  04/07/2012    Procedure: OPEN REDUCTION INTERNAL FIXATION (ORIF) PELVIC FRACTURE;  Surgeon: Rozanna Box, MD;  Location: [place];  Service: Orthopedics;  Laterality: N/A;  Right and left sacroiliac screw pinning,Irrigation and debridebridement open tibia and femur,removal external fixator.  . Flexible bronchoscopy  04/07/2012    Procedure: FLEXIBLE BRONCHOSCOPY;  Surgeon: Zenovia Jarred, MD;  Location: [place];  Service: General;;  START TIME=1645 END TIME=1700  . Cholecystectomy  07/28/2012    Procedure: LAPAROSCOPIC CHOLECYSTECTOMY;  Surgeon: Harl Bowie, MD;  [facility];  Service: General;  Laterality: N/A;    Family History  Problem Relation Age of Onset  . Cancer Father   . Colon cancer Father   . Esophageal cancer Neg Hx   . Rectal cancer Neg Hx   . Stomach cancer Neg Hx   . Colon cancer Paternal Uncle     pt thinks 8 pat uncles   Social History  Substance Use Topics  . Smoking status: Current Every Day Smoker -- 0.50 packs/day for 30 years    Types: Cigarettes    Last Attempt to Quit: 04/02/2012  . Smokeless tobacco: Never Used  . Alcohol Use: No     Comment: Pt denies alcohol consumption for 2 years    Review of Systems A complete 10 system review of systems was obtained and all systems are negative except as noted in the HPI and PMH.    Allergies  Shellfish allergy and Penicillins  Home Medications   Prior to Admission medications   Medication Sig Start Date End Date Taking? Authorizing Provider  amLODipine (NORVASC) 10 MG tablet Take 10 mg by mouth daily.   Yes Historical Provider, MD  aspirin EC 81 MG tablet Take 81 mg by mouth daily.     Yes Historical Provider, MD  chlorzoxazone (PARAFON) 500 MG tablet Take by mouth 4 (four) times daily as needed for muscle spasms.   Yes Historical Provider, MD  cholecalciferol (VITAMIN D) 1000 UNITS tablet Take 1,000 Units by mouth daily.   Yes Historical Provider, MD  furosemide (LASIX) 20 MG tablet Take 20 mg by mouth.   Yes Historical Provider, MD  Gabapentin, Once-Daily, (GRALISE PO) Take by mouth.   Yes Historical Provider, MD  lisinopril (PRINIVIL,ZESTRIL) 5 MG tablet Take 5 mg by mouth 2 (two) times daily.   Yes Historical Provider, MD  metFORMIN (GLUCOPHAGE) 500 MG tablet Take by mouth 2 (two) times daily with a meal.   Yes Historical Provider, MD  Oxycodone HCl 10 MG TABS Take 1 tablet (10 mg total) by mouth every 6 (six) hours as needed. Patient taking differently: Take 15 mg by mouth every 6 (six) hours as needed.  05/14/13  Yes Meredith Staggers, MD  pantoprazole (PROTONIX) 40 MG tablet Take 40 mg by mouth daily.   Yes  Historical Provider, MD  pravastatin (PRAVACHOL) 10 MG tablet Take 10 mg by mouth daily. Patient unsure of dose   Yes Historical Provider, MD   BP 140/93 mmHg  Pulse 89  Temp(Src) 97.4 F (36.3 C) (Oral)  Resp 18  SpO2 96% Physical Exam  Constitutional: He is oriented to person, place, and time. He appears well-developed and well-nourished.  HENT:  Head: Normocephalic and atraumatic. Head is without left periorbital erythema.  Mouth/Throat: Uvula is midline, oropharynx is clear and moist and mucous membranes are normal. No trismus in the jaw. Abnormal dentition. Dental caries present. No oropharyngeal exudate.  Mild left periorbital edema and left facial  swelling. No left upper lid edema or erythema. Left face non-TTP. There is 2 very small scabs on left nare. No signs of infection, no active bleeding.   Eyes: Conjunctivae and EOM are normal. Pupils are equal, round, and reactive to light.  Neck: Normal range of motion. Neck supple.  No left neck swelling.   Cardiovascular: Normal rate, regular rhythm and normal heart sounds.  Exam reveals no gallop and no friction rub.   No murmur heard. Pulmonary/Chest: Effort normal and breath sounds normal. No respiratory distress. He has no wheezes. He has no rales. He exhibits no tenderness.  Abdominal: Soft. Bowel sounds are normal. He exhibits no distension. There is no tenderness.  Musculoskeletal: Normal range of motion.  Lymphadenopathy:    He has no cervical adenopathy.  Neurological: He is alert and oriented to person, place, and time.  Skin: Skin is warm and dry.  Psychiatric: He has a normal mood and affect. His behavior is normal.  Nursing note and vitals reviewed.  ED Course  Procedures (including critical care time) DIAGNOSTIC STUDIES: Oxygen Saturation is 96% on RA, adequate by my interpretation.    COORDINATION OF CARE: 10:13 PM Discussed treatment plan with pt at bedside which includes lab work and CT head and pt agreed to  plan.   Labs Review Labs Reviewed  CBC WITH DIFFERENTIAL/PLATELET - Abnormal; Notable for the following:    WBC 12.0 (*)    RDW 16.3 (*)    Lymphs Abs 4.5 (*)    Monocytes Absolute 1.2 (*)    All other components within normal limits  COMPREHENSIVE METABOLIC PANEL - Abnormal; Notable for the following:    Glucose, Bld 151 (*)    Albumin 3.3 (*)    ALT 14 (*)    All other components within normal limits    Imaging Review No results found. I have personally reviewed and evaluated these images and lab results as part of my medical decision-making.   MDM   Final diagnoses:  Facial swelling   Filed Vitals:   09/29/15 2145  BP: 140/93  Pulse: 89  Temp: 97.4 F (36.3 C)  Resp: 18    Meds given in ED:  Medications - No data to display  New Prescriptions   No medications on file    Patient with left facial swelling x 10 days.  Hx DM.  Previously on amoxicillin for possible dental abscess; however, no alleviation of symptoms. Exam not concerning for Ludwig's angina or pharyngeal abscess.  Concern for orbital cellulitis vs other facial soft tissue infection.  Will obtain CT maxillofacial, CMP, and CBC.    CBC remarkable for WBC 12.0, HGB 14.4 CMP remarkable for glucose 151; otherwise, unremarkable. I was informed that the patient refused contrast media.  Therefore, CT maxillofacial w/out contrast obtained.  CT pending.  If negative, patient stable for discharge home with PCP follow up; otherwise, patient needs to be admitted for IV abx after failing outpatient treatment.  Patient signed out to oncoming midlevel Delos Haring, PA-C at shift change who will follow up on CT results.  I personally performed the services described in this documentation, which was scribed in my presence. The recorded information has been reviewed and is accurate.   Gloriann Loan, PA-C 09/30/15 0231  Dorie Rank, MD 09/30/15 [phone]

## 2015-09-30 ENCOUNTER — Emergency Department (HOSPITAL_COMMUNITY): Payer: Medicaid Other

## 2015-09-30 DIAGNOSIS — L03211 Cellulitis of face: Secondary | ICD-10-CM

## 2015-09-30 DIAGNOSIS — R220 Localized swelling, mass and lump, head: Secondary | ICD-10-CM | POA: Diagnosis not present

## 2015-09-30 DIAGNOSIS — F17210 Nicotine dependence, cigarettes, uncomplicated: Secondary | ICD-10-CM | POA: Diagnosis present

## 2015-09-30 DIAGNOSIS — Z7982 Long term (current) use of aspirin: Secondary | ICD-10-CM | POA: Diagnosis not present

## 2015-09-30 DIAGNOSIS — Z800 Family history of malignant neoplasm of digestive organs: Secondary | ICD-10-CM | POA: Diagnosis not present

## 2015-09-30 DIAGNOSIS — M79605 Pain in left leg: Secondary | ICD-10-CM | POA: Diagnosis present

## 2015-09-30 DIAGNOSIS — E119 Type 2 diabetes mellitus without complications: Secondary | ICD-10-CM

## 2015-09-30 DIAGNOSIS — Z7984 Long term (current) use of oral hypoglycemic drugs: Secondary | ICD-10-CM | POA: Diagnosis not present

## 2015-09-30 DIAGNOSIS — Z91013 Allergy to seafood: Secondary | ICD-10-CM | POA: Diagnosis not present

## 2015-09-30 DIAGNOSIS — L03213 Periorbital cellulitis: Principal | ICD-10-CM

## 2015-09-30 DIAGNOSIS — J45909 Unspecified asthma, uncomplicated: Secondary | ICD-10-CM | POA: Diagnosis present

## 2015-09-30 DIAGNOSIS — E785 Hyperlipidemia, unspecified: Secondary | ICD-10-CM | POA: Diagnosis present

## 2015-09-30 DIAGNOSIS — Z880 Allergy status to penicillin: Secondary | ICD-10-CM | POA: Diagnosis not present

## 2015-09-30 DIAGNOSIS — Z8611 Personal history of tuberculosis: Secondary | ICD-10-CM | POA: Diagnosis not present

## 2015-09-30 DIAGNOSIS — K219 Gastro-esophageal reflux disease without esophagitis: Secondary | ICD-10-CM | POA: Diagnosis present

## 2015-09-30 DIAGNOSIS — Z8614 Personal history of Methicillin resistant Staphylococcus aureus infection: Secondary | ICD-10-CM | POA: Diagnosis not present

## 2015-09-30 DIAGNOSIS — I10 Essential (primary) hypertension: Secondary | ICD-10-CM | POA: Diagnosis present

## 2015-09-30 MED ORDER — VANCOMYCIN HCL 10 G IV SOLR
2000.0000 mg | INTRAVENOUS | Status: AC
  Administered 2015-09-30: 2000 mg via INTRAVENOUS
  Filled 2015-09-30: qty 2000

## 2015-09-30 MED ORDER — VANCOMYCIN HCL 10 G IV SOLR
1250.0000 mg | Freq: Two times a day (BID) | INTRAVENOUS | Status: DC
  Administered 2015-09-30: 1250 mg via INTRAVENOUS
  Filled 2015-09-30 (×4): qty 1250

## 2015-09-30 MED ORDER — FUROSEMIDE 20 MG PO TABS
20.0000 mg | ORAL_TABLET | Freq: Every day | ORAL | Status: DC
  Filled 2015-09-30: qty 1

## 2015-09-30 MED ORDER — AMLODIPINE BESYLATE 10 MG PO TABS
10.0000 mg | ORAL_TABLET | Freq: Every day | ORAL | Status: DC
  Administered 2015-09-30 – 2015-10-01 (×2): 10 mg via ORAL
  Filled 2015-09-30: qty 2
  Filled 2015-09-30: qty 1

## 2015-09-30 MED ORDER — OXYCODONE HCL 5 MG PO TABS
15.0000 mg | ORAL_TABLET | Freq: Four times a day (QID) | ORAL | Status: DC | PRN
  Administered 2015-09-30: 15 mg via ORAL
  Filled 2015-09-30: qty 3

## 2015-09-30 MED ORDER — ENOXAPARIN SODIUM 80 MG/0.8ML ~~LOC~~ SOLN
65.0000 mg | SUBCUTANEOUS | Status: DC
  Administered 2015-09-30: 65 mg via SUBCUTANEOUS
  Filled 2015-09-30 (×2): qty 0.8

## 2015-09-30 MED ORDER — METFORMIN HCL 500 MG PO TABS: 1000.0000 mg | ORAL_TABLET | Freq: Every day | ORAL | Status: DC

## 2015-09-30 MED ORDER — METFORMIN HCL 500 MG PO TABS
500.0000 mg | ORAL_TABLET | Freq: Two times a day (BID) | ORAL | Status: DC
  Administered 2015-09-30: 500 mg via ORAL
  Filled 2015-09-30: qty 1

## 2015-09-30 MED ORDER — PANTOPRAZOLE SODIUM 40 MG PO TBEC
40.0000 mg | DELAYED_RELEASE_TABLET | Freq: Every day | ORAL | Status: DC
  Administered 2015-09-30 – 2015-10-01 (×2): 40 mg via ORAL
  Filled 2015-09-30 (×2): qty 1

## 2015-09-30 MED ORDER — PRAVASTATIN SODIUM 20 MG PO TABS
20.0000 mg | ORAL_TABLET | Freq: Every day | ORAL | Status: DC
  Administered 2015-09-30: 20 mg via ORAL
  Filled 2015-09-30 (×2): qty 1

## 2015-09-30 MED ORDER — LISINOPRIL 20 MG PO TABS
20.0000 mg | ORAL_TABLET | Freq: Every day | ORAL | Status: DC
  Administered 2015-09-30 – 2015-10-01 (×2): 20 mg via ORAL
  Filled 2015-09-30 (×2): qty 1

## 2015-09-30 MED ORDER — LISINOPRIL 10 MG PO TABS
5.0000 mg | ORAL_TABLET | Freq: Two times a day (BID) | ORAL | Status: DC
  Administered 2015-09-30: 5 mg via ORAL
  Filled 2015-09-30: qty 1

## 2015-09-30 MED ORDER — FUROSEMIDE 20 MG PO TABS: 40.0000 mg | ORAL_TABLET | Freq: Every day | ORAL | Status: DC

## 2015-09-30 MED ORDER — VITAMIN D 1000 UNITS PO TABS
1000.0000 [IU] | ORAL_TABLET | Freq: Every day | ORAL | Status: DC
  Administered 2015-09-30: 1000 [IU] via ORAL
  Filled 2015-09-30: qty 1

## 2015-09-30 MED ORDER — VANCOMYCIN HCL IN DEXTROSE 1-5 GM/200ML-% IV SOLN: 1000.0000 mg | Freq: Once | INTRAVENOUS | Status: DC

## 2015-09-30 MED ORDER — PRAVASTATIN SODIUM 10 MG PO TABS
10.0000 mg | ORAL_TABLET | Freq: Every day | ORAL | Status: DC
  Filled 2015-09-30: qty 1

## 2015-09-30 MED ORDER — HYDROMORPHONE HCL 1 MG/ML IJ SOLN
0.5000 mg | Freq: Once | INTRAMUSCULAR | Status: AC
  Administered 2015-09-30: 0.5 mg via INTRAVENOUS
  Filled 2015-09-30: qty 1

## 2015-09-30 MED ORDER — FUROSEMIDE 40 MG PO TABS
40.0000 mg | ORAL_TABLET | Freq: Every day | ORAL | Status: DC
  Administered 2015-09-30: 40 mg via ORAL
  Filled 2015-09-30: qty 1

## 2015-09-30 MED ORDER — CHLORZOXAZONE 500 MG PO TABS
500.0000 mg | ORAL_TABLET | Freq: Four times a day (QID) | ORAL | Status: DC | PRN
  Filled 2015-09-30: qty 1

## 2015-09-30 MED ORDER — ASPIRIN EC 81 MG PO TBEC
81.0000 mg | DELAYED_RELEASE_TABLET | Freq: Every day | ORAL | Status: DC
  Administered 2015-09-30 – 2015-10-01 (×2): 81 mg via ORAL
  Filled 2015-09-30 (×2): qty 1

## 2015-09-30 MED ORDER — DIPHENHYDRAMINE HCL 50 MG/ML IJ SOLN
25.0000 mg | Freq: Once | INTRAMUSCULAR | Status: AC
  Administered 2015-09-30: 25 mg via INTRAVENOUS
  Filled 2015-09-30: qty 1

## 2015-09-30 MED ORDER — ONDANSETRON HCL 4 MG/2ML IJ SOLN
4.0000 mg | Freq: Once | INTRAMUSCULAR | Status: AC
  Administered 2015-09-30: 4 mg via INTRAVENOUS
  Filled 2015-09-30: qty 2

## 2015-09-30 NOTE — ED Notes (Signed)
Pt sleeping  Waiting for c-t results

## 2015-09-30 NOTE — Progress Notes (Signed)
ANTIBIOTIC CONSULT NOTE - INITIAL  Pharmacy Consult for Vancomycin Indication: periorbital cellulitis  Allergies  Allergen Reactions  . Shellfish Allergy Anaphylaxis  . Penicillins Itching    Patient Measurements:   Ht: 71 in Wt:  133 kg  Vital Signs: Temp: 97.4 F (36.3 C) (01/13 2145) Temp Source: Oral (01/13 2145) BP: 140/93 mmHg (01/13 2145) Pulse Rate: 89 (01/13 2145) Intake/Output from previous day:   Intake/Output from this shift:    Labs:  Recent Labs  09/29/15 2250  WBC 12.0*  HGB 14.4  PLT 254  CREATININE 0.96   CrCl cannot be calculated (Unknown ideal weight.). No results for input(s): VANCOTROUGH, VANCOPEAK, VANCORANDOM, GENTTROUGH, GENTPEAK, GENTRANDOM, TOBRATROUGH, TOBRAPEAK, TOBRARND, AMIKACINPEAK, AMIKACINTROU, AMIKACIN in the last 72 hours.   Microbiology: No results found for this or any previous visit (from the past 720 hour(s)).  Medical History: Past Medical History  Diagnosis Date  . Obesity   . Hypertension   . Diabetes mellitus   . Headache(784.0)   . TOBACCO ABUSE 12/23/2008  . GERD 05/05/2007  . TRIGGER FINGER 05/05/2007  . REACTIVE AIRWAY DISEASE 12/23/2008    pt denies.  no inhaler  . Hyperlipidemia   . Substance abuse     ETOH  . Tuberculosis     pos PPD  . 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 Neuropathy due to medical condition ([place])     bilateral feet    Medications:  See electronic med rec  Assessment: 55 y.o. M presents with periorbital cellulitis after failing o/p treatment with amoxicillin. To begin vancomycin. Estimated normalized CrCl 90 ml/min. WBC elevated to 12. Afeb.  Goal of Therapy:  Vancomycin trough 10-15 mcg/ml  Plan:  Vancomycin 2000mg  IV now then 1250mg  IV q12h Will f/u micro data, renal function, and pt's clinical condition Vanc trough prn  Sherlon Handing, PharmD, BCPS Clinical pharmacist, pager [phone] 09/30/2015,3:32 AM

## 2015-09-30 NOTE — ED Notes (Signed)
Pt very grumpy he is still waiting on c-t.  Just called and was told he was in a stack of pts to come over

## 2015-09-30 NOTE — ED Notes (Signed)
Requested for pharm tech to review pt's meds d/t pt states Lisinopril should be 10mg .

## 2015-09-30 NOTE — ED Notes (Signed)
Card modified diet called in for breakfast.

## 2015-09-30 NOTE — ED Notes (Signed)
Patient transported to CT 
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
## 2015-09-30 NOTE — ED Notes (Signed)
Pt desating to 60% and below. 3L [place] in place and ambu bag set up at bedside

## 2015-09-30 NOTE — ED Notes (Signed)
Pt awake now threatening to walk out  He is tired of waiting for the c-t report

## 2015-09-30 NOTE — Discharge Instructions (Signed)
Continue taking the Zyrtec as prescribed.  Follow up with your primary care doctor.    Orbital Cellulitis   Orbital cellulitis is an infection in the eye socket (orbit) and the tissues that surround the eye. The infection can spread to the eyelids, eyebrow area, and cheek. It can also cause a pocket of pus to develop around the eye (orbital abscess). In severe cases, the infection can spread to the brain. Orbital cellulitis is a medical emergency.  CAUSES  The most common cause of this condition is a bacterial infection. The infection usually spreads to the eye socket from another part of the body. The infection may start in:  The nose or sinuses.  The eyelids.  Facial skin.  The bloodstream. RISK FACTORS  This condition is more likely to develop in people who have recently had one of the following:  Upper respiratory infection.  Sinus infection.  Eyelid or facial infection.  Eye injury.  Infection that affects the entire body or the bloodstream (systemic infection). SYMPTOMS  Symptoms of this condition usually start quickly. Symptoms include:  Eye pain that gets worse with eye movement.  Swelling around the eye.  Eye redness.  Bulging of the eye.  Inability to move the eye.  Double vision.  Fever. DIAGNOSIS  This condition may be diagnosed based on your symptoms and an eye exam. You may also have tests to confirm the diagnosis and to check for an orbital abscess. Other tests (cultures) may be done to find out what type of bacteria is causing the infection. Tests may include:  Complete blood count (CBC).  Blood culture.  Nose, sinus, or throat culture.  Imaging studies such as a CT scan or MRI. TREATMENT  This condition is usually treated in a hospital. Antibiotic medicines are given directly into a vein through an IV tube.  At first, you may get IV antibiotics to kill bacteria that often cause orbital cellulitis (broad spectrum antibiotics).  Your medicine may be changed if  cultures suggest that another antibiotic would be better.  If the IV antibiotics are working to treat your infection, you may be switched to oral antibiotics and allowed to go home.  In some cases, surgery may be needed to drain an orbital abscess. HOME CARE INSTRUCTIONS  Take medicines only as directed by your health care provider.  Take your antibiotic medicine as directed by your health care provider. Finish the antibiotic even if you start to feel better.  Return to your normal activities as directed by your health care provider. Ask your health care provider what activities are safe for you.  Keep all follow-up visits as directed by your health care provider. This is important. SEEK IMMEDIATE MEDICAL CARE IF:  Your eye pain or swelling returns or it gets worse.  You have any changes in your vision.  You have a fever. This information is not intended to replace advice given to you by your health care provider. Make sure you discuss any questions you have with your health care provider.  Document Released: 08/27/2001 Document Revised: 01/17/2015 Document Reviewed: 08/29/2014  Elsevier Interactive Patient Education [employer].

## 2015-09-30 NOTE — ED Notes (Signed)
To ct

## 2015-09-30 NOTE — ED Provider Notes (Signed)
Patient was seen by Sharol Given, PA-C  He is currently waiting for CT maxfac to r/o periorbital cellulitis after  Failing outpatient treatment with AMoxicillin.  Labs Reviewed  CBC WITH DIFFERENTIAL/PLATELET - Abnormal; Notable for the following:    WBC 12.0 (*)    RDW 16.3 (*)    Lymphs Abs 4.5 (*)    Monocytes Absolute 1.2 (*)    All other components within normal limits  COMPREHENSIVE METABOLIC PANEL - Abnormal; Notable for the following:    Glucose, Bld 151 (*)    Albumin 3.3 (*)    ALT 14 (*)    All other components within normal limits    CLINICAL DATA: 54 year old male with bilateral facial and periorbital swelling.  EXAM: CT MAXILLOFACIAL WITHOUT CONTRAST  TECHNIQUE: Multidetector CT imaging of the maxillofacial structures was performed. Multiplanar CT image reconstructions were also generated. A small metallic BB was placed on the right temple in order to reliably differentiate right from left.  COMPARISON: Head CT dated 04/04/2012  FINDINGS: Evaluation of this exam is limited in the absence of intravenous contrast.  There is no acute fracture. The maxilla, mandible, and pterygoid plates are intact. The 1.1 x 0.9 cm sclerotic lesion in the body of the left mandible appears benign and may represent a bone island. The globes, retro-orbital fat, and orbital walls are preserved. There is mild mucoperiosteal thickening of paranasal sinuses.  There is mild skin thickening with diffuse subcutaneous soft tissue haziness of the left periorbital region as well as soft tissues over the nose most compatible with cellulitis. No drainable fluid collection or abscess identified.  There is left submandibular adenopathy.  A 3 mm calcific focus in the floor of the mouth on the right concerning for a salivary gland duct calculus. The visualized parotid and submandibular glands appear unremarkable.  IMPRESSION: Left facial and periorbital soft tissue stranding  most compatible with cellulitis. No drainable fluid collection or abscess identified.  A 3 mm calcific focus in the floor of the mouth on the right concerning for sialadenitis.   Electronically Signed  By: Anner Crete M.D.  On: 09/30/2015 02:33   CT scan shows facial and periorbital cellulitis.   Medications  vancomycin (VANCOCIN) IVPB 1000 mg/200 mL premix (not administered)  diphenhydrAMINE (BENADRYL) injection 25 mg (not administered)  ondansetron (ZOFRAN) injection 4 mg (not administered)  HYDROmorphone (DILAUDID) injection 0.5 mg (not administered)    Patient admitted to Med/surg, [place], [facility] inpatient. Discused results and treatment with patient who is agreeable. Eye re-examined, no s/sx of entrapment or proptosis.  Filed Vitals:   09/29/15 2145  BP: 140/93  Pulse: 89  Temp: 97.4 F (36.3 C)  Resp: [address], PA-C 09/30/15 0327  Rolland Porter, MD 09/30/15 [phone]

## 2015-09-30 NOTE — ED Notes (Signed)
Tiffany, Pa-c, at the bedside to update, planning for admission

## 2015-09-30 NOTE — H&P (Signed)
[facility] History and Physical  Miguel Brady [id number] DOB: 02/18/1961 DOA: 09/29/2015  Referring physician: EDP PCP: Miguel Mccreedy, MD   Chief Complaint: Facial swelling   HPI: Miguel Brady is a 55 y.o. male with h/o DM, HTN, who presents to the ED with 2 week history of facial swelling on the L side of his face.  He has L eye pain and left eyelid swelling.  He saw PCP 8 days ago, started on amoxicillin TID.  Despite taking meds as prescribed symptoms have persisted.  He went to [facility] today who sent him to ED.  Review of Systems: Systems reviewed.  As above, otherwise negative  Past Medical History  Diagnosis Date  . Obesity   . Hypertension   . Diabetes mellitus   . Headache(784.0)   . TOBACCO ABUSE 12/23/2008  . GERD 05/05/2007  . TRIGGER FINGER 05/05/2007  . REACTIVE AIRWAY DISEASE 12/23/2008    pt denies.  no inhaler  . Hyperlipidemia   . Substance abuse     ETOH  . Tuberculosis     pos PPD  . 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 External fixation pelvis  04/03/2012    Procedure: EXTERNAL FIXATION PELVIS;  Surgeon: Rozanna Box, MD;  Location: [place];  Service: Orthopedics;;  . External fixation leg  04/03/2012    Procedure: EXTERNAL FIXATION LEG;  Surgeon: Rozanna Box, MD;  Location: [place];  Service: Orthopedics;  Laterality: Left;  Left femur  . Chest tube insertion  04/03/2012    Procedure: CHEST TUBE INSERTION;  Surgeon: Zenovia Jarred, MD;  Location: [place];  Service: General;  Laterality: Left;  . Incision and drainage of wound  04/03/2012    Procedure: IRRIGATION AND DEBRIDEMENT WOUND;  Surgeon: Otilio Connors, MD;  Location: [place];  Service: Neurosurgery;  Laterality: N/A;  Frontal.  . Tibia im nail insertion  04/07/2012    Procedure: INTRAMEDULLARY  (IM) NAIL TIBIAL;  Surgeon: Rozanna Box, MD;  Location: [place];  Service: Orthopedics;  Laterality: Left;  . Femur im nail  04/07/2012    Procedure: INTRAMEDULLARY (IM) NAIL FEMORAL;  Surgeon: Rozanna Box, MD;  Location: [place];  Service: Orthopedics;  Laterality: Left;  . Orif patella  04/07/2012    Procedure: OPEN REDUCTION INTERNAL (ORIF) FIXATION PATELLA;  Surgeon: Rozanna Box, MD;  Location: [place];  Service: Orthopedics;  Laterality: Left;  . Orif pelvic fracture  04/07/2012    Procedure: OPEN REDUCTION INTERNAL FIXATION (ORIF) PELVIC FRACTURE;  Surgeon: Rozanna Box, MD;  Location: [place];  Service: Orthopedics;  Laterality: N/A;  Right and left sacroiliac screw pinning,Irrigation and debridebridement open tibia and femur,removal external fixator.  . Flexible bronchoscopy  04/07/2012    Procedure: FLEXIBLE BRONCHOSCOPY;  Surgeon: Zenovia Jarred, MD;  Location: [place];  Service: General;;  START TIME=1645 END TIME=1700  . Cholecystectomy  07/28/2012    Procedure: LAPAROSCOPIC CHOLECYSTECTOMY;  Surgeon: Harl Bowie, MD;  Location: [facility];  Service: General;  Laterality: N/A;   Social History:  reports that he has been smoking Cigarettes.  He has a 15 pack-year smoking history. He has never used smokeless tobacco. He reports that he does not drink alcohol or use illicit drugs.  Allergies  Allergen Reactions  . Shellfish Allergy Anaphylaxis  . Penicillins Itching    Family History  Problem Relation Age of Onset  . Cancer Father   . Colon cancer Father   . Esophageal cancer Neg Hx   . Rectal cancer Neg Hx   . Stomach cancer Neg Hx   . Colon cancer Paternal Uncle     pt thinks 8 pat uncles     Prior to Admission medications   Medication Sig Start Date End Date Taking? Authorizing Provider  amLODipine (NORVASC) 10 MG tablet Take 10 mg by mouth daily.   Yes Historical Provider, MD  aspirin EC 81 MG tablet Take 81 mg by mouth daily.     Yes Historical Provider, MD   chlorzoxazone (PARAFON) 500 MG tablet Take by mouth 4 (four) times daily as needed for muscle spasms.   Yes Historical Provider, MD  cholecalciferol (VITAMIN D) 1000 UNITS tablet Take 1,000 Units by mouth daily.   Yes Historical Provider, MD  furosemide (LASIX) 20 MG tablet Take 20 mg by mouth.   Yes Historical Provider, MD  Gabapentin, Once-Daily, (GRALISE PO) Take by mouth.   Yes Historical Provider, MD  lisinopril (PRINIVIL,ZESTRIL) 5 MG tablet Take 5 mg by mouth 2 (two) times daily.   Yes Historical Provider, MD  metFORMIN (GLUCOPHAGE) 500 MG tablet Take by mouth 2 (two) times daily with a meal.   Yes Historical Provider, MD  Oxycodone HCl 10 MG TABS Take 1 tablet (10 mg total) by mouth every 6 (six) hours as needed. Patient taking differently: Take 15 mg by mouth every 6 (six) hours as needed.  05/14/13  Yes Meredith Staggers, MD  pantoprazole (PROTONIX) 40 MG tablet Take 40 mg by mouth daily.   Yes Historical Provider, MD  pravastatin (PRAVACHOL) 10 MG tablet Take 10 mg by mouth daily. Patient unsure of dose   Yes Historical Provider, MD   Physical Exam: Filed Vitals:   09/29/15 2145  BP: 140/93  Pulse: 89  Temp: 97.4 F (36.3 C)  Resp: 18    BP 140/93 mmHg  Pulse 89  Temp(Src) 97.4 F (36.3 C) (Oral)  Resp 18  SpO2 96%  General Appearance:    Alert, oriented, no distress, appears stated age  Head:    Normocephalic, atraumatic, erythema around L eye, cellulitis of L side of face  Eyes:    PERRL, EOMI, sclera non-icteric        Nose:   Nares without drainage or epistaxis. Mucosa, turbinates normal  Throat:   Moist mucous membranes. Oropharynx without erythema or exudate.  Neck:   Supple. No carotid bruits.  No thyromegaly.  No lymphadenopathy.   Back:     No CVA tenderness, no spinal tenderness  Lungs:     Clear to auscultation bilaterally, without wheezes, rhonchi or rales  Chest wall:    No tenderness to palpitation  Heart:    Regular rate and rhythm without murmurs,  gallops, rubs  Abdomen:     Soft, non-tender, nondistended, normal bowel sounds, no organomegaly  Genitalia:    deferred  Rectal:    deferred  Extremities:   No clubbing, cyanosis or edema.  Pulses:   2+ and symmetric all extremities  Skin:   Skin color, texture, turgor normal, no rashes or lesions  Lymph nodes:   Cervical, supraclavicular, and axillary nodes normal  Neurologic:   CNII-XII intact. Normal strength, sensation and reflexes      throughout    Labs on  Admission:  Basic Metabolic Panel:  Recent Labs Lab 09/29/15 2250  NA 142  K 3.5  CL 106  CO2 27  GLUCOSE 151*  BUN 8  CREATININE 0.96  CALCIUM 9.2   Liver Function Tests:  Recent Labs Lab 09/29/15 2250  AST 16  ALT 14*  ALKPHOS 77  BILITOT 0.7  PROT 7.0  ALBUMIN 3.3*   No results for input(s): LIPASE, AMYLASE in the last 168 hours. No results for input(s): AMMONIA in the last 168 hours. CBC:  Recent Labs Lab 09/29/15 2250  WBC 12.0*  NEUTROABS 6.1  HGB 14.4  HCT 44.5  MCV 90.4  PLT 254   Cardiac Enzymes: No results for input(s): CKTOTAL, CKMB, CKMBINDEX, TROPONINI in the last 168 hours.  BNP (last 3 results) No results for input(s): PROBNP in the last 8760 hours. CBG: No results for input(s): GLUCAP in the last 168 hours.  Radiological Exams on Admission: Ct Maxillofacial Wo Cm  09/30/2015  CLINICAL DATA:  55 year old male with bilateral facial and periorbital swelling. EXAM: CT MAXILLOFACIAL WITHOUT CONTRAST TECHNIQUE: Multidetector CT imaging of the maxillofacial structures was performed. Multiplanar CT image reconstructions were also generated. A small metallic BB was placed on the right temple in order to reliably differentiate right from left. COMPARISON:  Head CT dated 04/04/2012 FINDINGS: Evaluation of this exam is limited in the absence of intravenous contrast. There is no acute fracture. The maxilla, mandible, and pterygoid plates are intact. The 1.1 x 0.9 cm sclerotic lesion in the  body of the left mandible appears benign and may represent a bone island. The globes, retro-orbital fat, and orbital walls are preserved. There is mild mucoperiosteal thickening of paranasal sinuses. There is mild skin thickening with diffuse subcutaneous soft tissue haziness of the left periorbital region as well as soft tissues over the nose most compatible with cellulitis. No drainable fluid collection or abscess identified. There is left submandibular adenopathy. A 3 mm calcific focus in the floor of the mouth on the right concerning for a salivary gland duct calculus. The visualized parotid and submandibular glands appear unremarkable. IMPRESSION: Left facial and periorbital soft tissue stranding most compatible with cellulitis. No drainable fluid collection or abscess identified. A 3 mm calcific focus in the floor of the mouth on the right concerning for sialadenitis. Electronically Signed   By: Anner Crete M.D.   On: 09/30/2015 02:33    EKG: Independently reviewed.  Assessment/Plan Principal Problem:   Periorbital cellulitis Active Problems:   DM2 (diabetes mellitus, type 2) (HCC)   HLD (hyperlipidemia)   HTN (hypertension)   1. Periorbital cellulitis - failed outpatient therapy 1. Starting patient on IV vanc since he has clearly failed outpatient ABx at this point 2. 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 DM2 - Continue home meds 3. HTN - continue home meds 4. HLD - continue statin    Code Status: Full Code  Family Communication: No family in room Disposition Plan: Admit to inpatient   Time spent: 50 min  Huy Majid M. [facility] Pager [phone]  If 7AM-7PM, please contact the day team taking care of the patient [email or web address] Password [facility] 09/30/2015, 3:46 AM

## 2015-09-30 NOTE — ED Notes (Signed)
Admitting MD in w pt

## 2015-09-30 NOTE — ED Notes (Signed)
Pt returned from c-t  Visiting another pt in fast track

## 2015-09-30 NOTE — ED Notes (Signed)
Pharm Tech in w/pt. 

## 2015-09-30 NOTE — ED Notes (Signed)
Patient was given a sprite zero. Pt Mother was given some gram crackers and sprite. A regular diet ordered for lunch for patient.

## 2015-10-01 DIAGNOSIS — L03211 Cellulitis of face: Secondary | ICD-10-CM | POA: Diagnosis present

## 2015-10-01 LAB — BASIC METABOLIC PANEL
Anion gap: 9 (ref 5–15)
BUN: 6 mg/dL (ref 6–20)
CHLORIDE: 102 mmol/L (ref 101–111)
CO2: 28 mmol/L (ref 22–32)
CREATININE: 0.89 mg/dL (ref 0.61–1.24)
Calcium: 8.5 mg/dL — ABNORMAL LOW (ref 8.9–10.3)
GFR calc Af Amer: >60 mL/min (ref >60)
GFR calc non Af Amer: >60 mL/min (ref >60)
GLUCOSE: 150 mg/dL — AB (ref 65–99)
Potassium: 3.5 mmol/L (ref 3.5–5.1)
SODIUM: 139 mmol/L (ref 135–145)

## 2015-10-01 LAB — CBC
HCT: 42.7 % (ref 39.0–52.0)
HEMOGLOBIN: 13.7 g/dL (ref 13.0–17.0)
MCH: 28.8 pg (ref 26.0–34.0)
MCHC: 32.1 g/dL (ref 30.0–36.0)
MCV: 89.7 fL (ref 78.0–100.0)
PLATELETS: 254 10*3/uL (ref 150–400)
RBC: 4.76 MIL/uL (ref 4.22–5.81)
RDW: 16.3 % — ABNORMAL HIGH (ref 11.5–15.5)
WBC: 9.6 10*3/uL (ref 4.0–10.5)

## 2015-10-01 MED ORDER — DIPHENHYDRAMINE HCL 25 MG PO CAPS: 25.0000 mg | ORAL_CAPSULE | Freq: Three times a day (TID) | ORAL | Status: DC | PRN

## 2015-10-01 MED ORDER — DOXYCYCLINE HYCLATE 100 MG PO TBEC
100.0000 mg | DELAYED_RELEASE_TABLET | Freq: Two times a day (BID) | ORAL | Status: DC
Start: 2015-10-01 — End: 2016-01-23
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
## 2015-10-01 NOTE — Progress Notes (Signed)
Patient discharged home with brother. Discharge instructions given. Prescription for antibiotic previously called in to [facility]. No complaints of pain or discomfort on discharge.

## 2015-10-02 LAB — HEMOGLOBIN A1C
HEMOGLOBIN A1C: 6.2 % — AB (ref 4.8–5.6)
MEAN PLASMA GLUCOSE: 131 mg/dL

## 2015-10-06 DIAGNOSIS — IMO0002 Reserved for concepts with insufficient information to code with codable children: Secondary | ICD-10-CM | POA: Insufficient documentation

## 2015-10-06 DIAGNOSIS — E1165 Type 2 diabetes mellitus with hyperglycemia: Secondary | ICD-10-CM | POA: Insufficient documentation

## 2015-10-26 ENCOUNTER — Ambulatory Visit (INDEPENDENT_AMBULATORY_CARE_PROVIDER_SITE_OTHER): Payer: Medicaid Other | Admitting: Podiatry

## 2015-10-26 ENCOUNTER — Encounter: Payer: Self-pay | Admitting: Podiatry

## 2015-10-26 DIAGNOSIS — L03032 Cellulitis of left toe: Secondary | ICD-10-CM

## 2015-10-26 DIAGNOSIS — L600 Ingrowing nail: Secondary | ICD-10-CM | POA: Diagnosis not present

## 2015-10-26 DIAGNOSIS — L03012 Cellulitis of left finger: Secondary | ICD-10-CM

## 2015-10-26 NOTE — Progress Notes (Signed)
Subjective:     Patient ID: Miguel Brady, male   DOB: [birth year], 55 y.o.   MRN: [id number]  HPI patient presents with pain in the left hallux lateral border of the nail states that it bothers him at times and that he has trouble trimming it. States his sugars been running well   Review of Systems     Objective:   Physical Exam Her vascular status intact muscle strength adequate with incurvated left hallux lateral border that is moderately painful when pressed with no other issues noted around the area    Assessment:     Ingrown toenail deformity left hallux lateral border with moderate pain    Plan:     H&P condition reviewed and at this time debrided out the lateral corner and created a reduction of pressure on it. Applied Band-Aid with Neosporin instructed on soaks and will reappoint if any issues were to reoccur

## 2016-01-23 ENCOUNTER — Inpatient Hospital Stay (HOSPITAL_COMMUNITY)
Admission: EM | Admit: 2016-01-23 | Discharge: 2016-01-26 | DRG: 872 | Disposition: A | Discharge status: Home Health | Payer: Medicaid Other | Attending: Internal Medicine | Admitting: Internal Medicine

## 2016-01-23 ENCOUNTER — Emergency Department (HOSPITAL_COMMUNITY): Payer: Medicaid Other

## 2016-01-23 ENCOUNTER — Encounter (HOSPITAL_COMMUNITY): Payer: Self-pay

## 2016-01-23 DIAGNOSIS — R109 Unspecified abdominal pain: Secondary | ICD-10-CM | POA: Insufficient documentation

## 2016-01-23 DIAGNOSIS — Z794 Long term (current) use of insulin: Secondary | ICD-10-CM

## 2016-01-23 DIAGNOSIS — N323 Diverticulum of bladder: Secondary | ICD-10-CM | POA: Diagnosis present

## 2016-01-23 DIAGNOSIS — E119 Type 2 diabetes mellitus without complications: Secondary | ICD-10-CM | POA: Diagnosis not present

## 2016-01-23 DIAGNOSIS — R112 Nausea with vomiting, unspecified: Secondary | ICD-10-CM

## 2016-01-23 DIAGNOSIS — A419 Sepsis, unspecified organism: Secondary | ICD-10-CM | POA: Diagnosis present

## 2016-01-23 DIAGNOSIS — A4189 Other specified sepsis: Principal | ICD-10-CM | POA: Diagnosis present

## 2016-01-23 DIAGNOSIS — Z6841 Body Mass Index (BMI) 40.0 and over, adult: Secondary | ICD-10-CM

## 2016-01-23 DIAGNOSIS — Z880 Allergy status to penicillin: Secondary | ICD-10-CM

## 2016-01-23 DIAGNOSIS — N12 Tubulo-interstitial nephritis, not specified as acute or chronic: Secondary | ICD-10-CM | POA: Diagnosis present

## 2016-01-23 DIAGNOSIS — K5909 Other constipation: Secondary | ICD-10-CM | POA: Diagnosis present

## 2016-01-23 DIAGNOSIS — Z7984 Long term (current) use of oral hypoglycemic drugs: Secondary | ICD-10-CM

## 2016-01-23 DIAGNOSIS — E876 Hypokalemia: Secondary | ICD-10-CM | POA: Insufficient documentation

## 2016-01-23 DIAGNOSIS — R6510 Systemic inflammatory response syndrome (SIRS) of non-infectious origin without acute organ dysfunction: Secondary | ICD-10-CM | POA: Diagnosis not present

## 2016-01-23 DIAGNOSIS — E860 Dehydration: Secondary | ICD-10-CM | POA: Diagnosis present

## 2016-01-23 DIAGNOSIS — E66813 Obesity, class 3: Secondary | ICD-10-CM | POA: Diagnosis present

## 2016-01-23 DIAGNOSIS — Z91013 Allergy to seafood: Secondary | ICD-10-CM

## 2016-01-23 DIAGNOSIS — E785 Hyperlipidemia, unspecified: Secondary | ICD-10-CM | POA: Diagnosis present

## 2016-01-23 DIAGNOSIS — F17200 Nicotine dependence, unspecified, uncomplicated: Secondary | ICD-10-CM | POA: Diagnosis present

## 2016-01-23 DIAGNOSIS — K219 Gastro-esophageal reflux disease without esophagitis: Secondary | ICD-10-CM | POA: Diagnosis not present

## 2016-01-23 DIAGNOSIS — R509 Fever, unspecified: Secondary | ICD-10-CM

## 2016-01-23 DIAGNOSIS — E669 Obesity, unspecified: Secondary | ICD-10-CM | POA: Diagnosis present

## 2016-01-23 DIAGNOSIS — Z79899 Other long term (current) drug therapy: Secondary | ICD-10-CM

## 2016-01-23 DIAGNOSIS — D72829 Elevated white blood cell count, unspecified: Secondary | ICD-10-CM

## 2016-01-23 DIAGNOSIS — Z79891 Long term (current) use of opiate analgesic: Secondary | ICD-10-CM

## 2016-01-23 DIAGNOSIS — N10 Acute tubulo-interstitial nephritis: Secondary | ICD-10-CM | POA: Diagnosis present

## 2016-01-23 DIAGNOSIS — Z7982 Long term (current) use of aspirin: Secondary | ICD-10-CM

## 2016-01-23 DIAGNOSIS — N5082 Scrotal pain: Secondary | ICD-10-CM

## 2016-01-23 DIAGNOSIS — N5089 Other specified disorders of the male genital organs: Secondary | ICD-10-CM | POA: Diagnosis present

## 2016-01-23 DIAGNOSIS — N390 Urinary tract infection, site not specified: Secondary | ICD-10-CM

## 2016-01-23 DIAGNOSIS — Z87820 Personal history of traumatic brain injury: Secondary | ICD-10-CM

## 2016-01-23 DIAGNOSIS — I10 Essential (primary) hypertension: Secondary | ICD-10-CM | POA: Diagnosis present

## 2016-01-23 DIAGNOSIS — R946 Abnormal results of thyroid function studies: Secondary | ICD-10-CM | POA: Diagnosis present

## 2016-01-23 DIAGNOSIS — N179 Acute kidney failure, unspecified: Secondary | ICD-10-CM | POA: Diagnosis present

## 2016-01-23 HISTORY — DX: Nausea with vomiting, unspecified: R11.2

## 2016-01-23 LAB — COMPREHENSIVE METABOLIC PANEL
ALBUMIN: 3.7 g/dL (ref 3.5–5.0)
ALK PHOS: 77 U/L (ref 38–126)
ALT: 14 U/L — ABNORMAL LOW (ref 17–63)
ANION GAP: 11 (ref 5–15)
AST: 15 U/L (ref 15–41)
BUN: 10 mg/dL (ref 6–20)
CHLORIDE: 102 mmol/L (ref 101–111)
CO2: 29 mmol/L (ref 22–32)
Calcium: 8.9 mg/dL (ref 8.9–10.3)
Creatinine, Ser: 1.19 mg/dL (ref 0.61–1.24)
GFR calc non Af Amer: >60 mL/min (ref >60)
GLUCOSE: 127 mg/dL — AB (ref 65–99)
POTASSIUM: 3.3 mmol/L — AB (ref 3.5–5.1)
Sodium: 142 mmol/L (ref 135–145)
Total Bilirubin: 1.3 mg/dL — ABNORMAL HIGH (ref 0.3–1.2)
Total Protein: 8.7 g/dL — ABNORMAL HIGH (ref 6.5–8.1)

## 2016-01-23 LAB — URINALYSIS, ROUTINE W REFLEX MICROSCOPIC
BILIRUBIN URINE: NEGATIVE
GLUCOSE, UA: NEGATIVE mg/dL
KETONES UR: NEGATIVE mg/dL
NITRITE: POSITIVE — AB
PROTEIN: 100 mg/dL — AB
Specific Gravity, Urine: 1.014 (ref 1.005–1.030)
pH: 6 (ref 5.0–8.0)

## 2016-01-23 LAB — URINE MICROSCOPIC-ADD ON

## 2016-01-23 LAB — GLUCOSE, CAPILLARY
GLUCOSE-CAPILLARY: 138 mg/dL — AB (ref 65–99)
GLUCOSE-CAPILLARY: 100 mg/dL — AB (ref 65–99)

## 2016-01-23 LAB — MAGNESIUM: Magnesium: 1.5 mg/dL — ABNORMAL LOW (ref 1.7–2.4)

## 2016-01-23 LAB — CBC WITH DIFFERENTIAL/PLATELET
BASOS ABS: 0 10*3/uL (ref 0.0–0.1)
Basophils Relative: 0 %
EOS ABS: 0 10*3/uL (ref 0.0–0.7)
Eosinophils Relative: 0 %
HCT: 44.2 % (ref 39.0–52.0)
HEMOGLOBIN: 14.2 g/dL (ref 13.0–17.0)
LYMPHS PCT: 14 %
Lymphs Abs: 2.8 10*3/uL (ref 0.7–4.0)
MCH: 28.9 pg (ref 26.0–34.0)
MCHC: 32.1 g/dL (ref 30.0–36.0)
MCV: 90 fL (ref 78.0–100.0)
MONO ABS: 2.4 10*3/uL — AB (ref 0.1–1.0)
Monocytes Relative: 12 %
NEUTROS ABS: 14.9 10*3/uL — AB (ref 1.7–7.7)
Neutrophils Relative %: 74 %
PLATELETS: 302 10*3/uL (ref 150–400)
RBC: 4.91 MIL/uL (ref 4.22–5.81)
RDW: 15 % (ref 11.5–15.5)
WBC: 20.1 10*3/uL — ABNORMAL HIGH (ref 4.0–10.5)

## 2016-01-23 LAB — HEPATIC FUNCTION PANEL
ALBUMIN: 3.3 g/dL — AB (ref 3.5–5.0)
ALK PHOS: 66 U/L (ref 38–126)
ALT: 12 U/L — ABNORMAL LOW (ref 17–63)
AST: 13 U/L — AB (ref 15–41)
BILIRUBIN TOTAL: 1.5 mg/dL — AB (ref 0.3–1.2)
Bilirubin, Direct: 0.4 mg/dL (ref 0.1–0.5)
Indirect Bilirubin: 1.1 mg/dL — ABNORMAL HIGH (ref 0.3–0.9)
Total Protein: 7.8 g/dL (ref 6.5–8.1)

## 2016-01-23 LAB — TSH: TSH: 0.227 u[IU]/mL — ABNORMAL LOW (ref 0.350–4.500)

## 2016-01-23 LAB — PHOSPHORUS: Phosphorus: 2.7 mg/dL (ref 2.5–4.6)

## 2016-01-23 LAB — LIPASE, BLOOD: Lipase: 25 U/L (ref 11–51)

## 2016-01-23 LAB — MRSA PCR SCREENING: MRSA by PCR: NEGATIVE

## 2016-01-23 LAB — I-STAT CG4 LACTIC ACID, ED: LACTIC ACID, VENOUS: 1.49 mmol/L (ref 0.5–2.0)

## 2016-01-23 MED ORDER — NICOTINE 14 MG/24HR TD PT24
14.0000 mg | MEDICATED_PATCH | Freq: Every day | TRANSDERMAL | Status: DC
  Administered 2016-01-25: 14 mg via TRANSDERMAL
  Filled 2016-01-23 (×2): qty 1

## 2016-01-23 MED ORDER — POTASSIUM CHLORIDE CRYS ER 20 MEQ PO TBCR
40.0000 meq | EXTENDED_RELEASE_TABLET | Freq: Every day | ORAL | Status: DC
  Administered 2016-01-23: 40 meq via ORAL
  Filled 2016-01-23 (×2): qty 2

## 2016-01-23 MED ORDER — ACETAMINOPHEN 325 MG PO TABS
650.0000 mg | ORAL_TABLET | Freq: Once | ORAL | Status: AC
  Administered 2016-01-23: 650 mg via ORAL
  Filled 2016-01-23: qty 2

## 2016-01-23 MED ORDER — ACETAMINOPHEN 650 MG RE SUPP: 650.0000 mg | Freq: Four times a day (QID) | RECTAL | Status: DC | PRN

## 2016-01-23 MED ORDER — VITAMIN B-1 100 MG PO TABS
100.0000 mg | ORAL_TABLET | Freq: Every day | ORAL | Status: DC
  Administered 2016-01-23 – 2016-01-25 (×3): 100 mg via ORAL
  Filled 2016-01-23 (×3): qty 1

## 2016-01-23 MED ORDER — ACETAMINOPHEN 325 MG PO TABS
650.0000 mg | ORAL_TABLET | Freq: Four times a day (QID) | ORAL | Status: DC | PRN
  Administered 2016-01-24 – 2016-01-25 (×2): 650 mg via ORAL
  Filled 2016-01-23 (×2): qty 2

## 2016-01-23 MED ORDER — INSULIN ASPART 100 UNIT/ML ~~LOC~~ SOLN: 0.0000 [IU] | Freq: Every day | SUBCUTANEOUS | Status: DC

## 2016-01-23 MED ORDER — DEXTROSE 5 % IV SOLN
1.0000 g | Freq: Once | INTRAVENOUS | Status: AC
  Administered 2016-01-23: 1 g via INTRAVENOUS
  Filled 2016-01-23: qty 10

## 2016-01-23 MED ORDER — IBUPROFEN 200 MG PO TABS
400.0000 mg | ORAL_TABLET | Freq: Three times a day (TID) | ORAL | Status: DC | PRN
  Administered 2016-01-23 – 2016-01-24 (×2): 400 mg via ORAL
  Filled 2016-01-23 (×2): qty 2

## 2016-01-23 MED ORDER — OXYCODONE HCL 5 MG PO TABS
15.0000 mg | ORAL_TABLET | Freq: Three times a day (TID) | ORAL | Status: DC | PRN
  Administered 2016-01-24 – 2016-01-25 (×3): 15 mg via ORAL
  Filled 2016-01-23 (×3): qty 3

## 2016-01-23 MED ORDER — PANTOPRAZOLE SODIUM 40 MG PO TBEC
40.0000 mg | DELAYED_RELEASE_TABLET | Freq: Every day | ORAL | Status: DC
  Administered 2016-01-24 – 2016-01-25 (×3): 40 mg via ORAL
  Filled 2016-01-23 (×3): qty 1

## 2016-01-23 MED ORDER — SODIUM CHLORIDE 0.9 % IV SOLN
INTRAVENOUS | Status: DC
  Administered 2016-01-23: 75 mL via INTRAVENOUS
  Administered 2016-01-24: 05:00:00 via INTRAVENOUS

## 2016-01-23 MED ORDER — GABAPENTIN 300 MG PO CAPS
600.0000 mg | ORAL_CAPSULE | Freq: Every day | ORAL | Status: DC
  Administered 2016-01-24 – 2016-01-25 (×3): 600 mg via ORAL
  Filled 2016-01-23 (×3): qty 2

## 2016-01-23 MED ORDER — PRAVASTATIN SODIUM 20 MG PO TABS
20.0000 mg | ORAL_TABLET | Freq: Every day | ORAL | Status: DC
  Administered 2016-01-24 – 2016-01-25 (×3): 20 mg via ORAL
  Filled 2016-01-23 (×3): qty 1

## 2016-01-23 MED ORDER — GABAPENTIN (ONCE-DAILY) 600 MG PO TABS: 600.0000 mg | ORAL_TABLET | Freq: Every day | ORAL | Status: DC

## 2016-01-23 MED ORDER — LISINOPRIL 20 MG PO TABS
20.0000 mg | ORAL_TABLET | Freq: Every day | ORAL | Status: DC
  Administered 2016-01-23 – 2016-01-25 (×3): 20 mg via ORAL
  Filled 2016-01-23 (×3): qty 1

## 2016-01-23 MED ORDER — SODIUM CHLORIDE 0.9 % IV BOLUS (SEPSIS)
1000.0000 mL | Freq: Once | INTRAVENOUS | Status: AC
  Administered 2016-01-23: 1000 mL via INTRAVENOUS

## 2016-01-23 MED ORDER — CHLORZOXAZONE 500 MG PO TABS
500.0000 mg | ORAL_TABLET | Freq: Four times a day (QID) | ORAL | Status: DC | PRN
  Administered 2016-01-24: 500 mg via ORAL
  Filled 2016-01-23 (×2): qty 1

## 2016-01-23 MED ORDER — VITAMIN D 1000 UNITS PO TABS
1000.0000 [IU] | ORAL_TABLET | Freq: Every day | ORAL | Status: DC
  Administered 2016-01-24 – 2016-01-25 (×3): 1000 [IU] via ORAL
  Filled 2016-01-23 (×3): qty 1

## 2016-01-23 MED ORDER — AMLODIPINE BESYLATE 10 MG PO TABS
10.0000 mg | ORAL_TABLET | Freq: Every day | ORAL | Status: DC
  Administered 2016-01-24 – 2016-01-25 (×3): 10 mg via ORAL
  Filled 2016-01-23 (×3): qty 1

## 2016-01-23 MED ORDER — DEXTROSE 5 % IV SOLN
1.0000 g | INTRAVENOUS | Status: DC
  Administered 2016-01-24 – 2016-01-25 (×2): 1 g via INTRAVENOUS
  Filled 2016-01-23 (×3): qty 10

## 2016-01-23 MED ORDER — FOLIC ACID 1 MG PO TABS
1.0000 mg | ORAL_TABLET | Freq: Every day | ORAL | Status: DC
  Administered 2016-01-23 – 2016-01-25 (×3): 1 mg via ORAL
  Filled 2016-01-23 (×3): qty 1

## 2016-01-23 MED ORDER — MORPHINE SULFATE (PF) 4 MG/ML IV SOLN
4.0000 mg | Freq: Once | INTRAVENOUS | Status: AC
Start: 2016-01-23 — End: 2016-01-23
  Administered 2016-01-23: 4 mg via INTRAVENOUS
  Filled 2016-01-23: qty 1

## 2016-01-23 MED ORDER — HYDROMORPHONE HCL 1 MG/ML IJ SOLN
1.0000 mg | Freq: Once | INTRAMUSCULAR | Status: AC
  Administered 2016-01-23: 1 mg via INTRAVENOUS
  Filled 2016-01-23: qty 1

## 2016-01-23 MED ORDER — IOPAMIDOL (ISOVUE-300) INJECTION 61%
100.0000 mL | Freq: Once | INTRAVENOUS | Status: AC | PRN
Start: 2016-01-23 — End: 2016-01-23
  Administered 2016-01-23: 100 mL via INTRAVENOUS

## 2016-01-23 MED ORDER — LORATADINE 10 MG PO TABS
10.0000 mg | ORAL_TABLET | Freq: Every day | ORAL | Status: DC
  Administered 2016-01-23 – 2016-01-25 (×3): 10 mg via ORAL
  Filled 2016-01-23 (×3): qty 1

## 2016-01-23 MED ORDER — ONDANSETRON HCL 4 MG/2ML IJ SOLN: 4.0000 mg | Freq: Four times a day (QID) | INTRAMUSCULAR | Status: DC | PRN

## 2016-01-23 MED ORDER — ASPIRIN EC 81 MG PO TBEC
81.0000 mg | DELAYED_RELEASE_TABLET | Freq: Every day | ORAL | Status: DC
  Administered 2016-01-24 – 2016-01-25 (×3): 81 mg via ORAL
  Filled 2016-01-23 (×3): qty 1

## 2016-01-23 MED ORDER — POTASSIUM CHLORIDE CRYS ER 20 MEQ PO TBCR
20.0000 meq | EXTENDED_RELEASE_TABLET | Freq: Once | ORAL | Status: AC
  Administered 2016-01-23: 20 meq via ORAL
  Filled 2016-01-23: qty 1

## 2016-01-23 MED ORDER — HEPARIN SODIUM (PORCINE) 5000 UNIT/ML IJ SOLN
5000.0000 [IU] | Freq: Three times a day (TID) | INTRAMUSCULAR | Status: DC
  Administered 2016-01-24 – 2016-01-26 (×7): 5000 [IU] via SUBCUTANEOUS
  Filled 2016-01-23 (×8): qty 1

## 2016-01-23 MED ORDER — INSULIN ASPART 100 UNIT/ML ~~LOC~~ SOLN
0.0000 [IU] | Freq: Three times a day (TID) | SUBCUTANEOUS | Status: DC
  Administered 2016-01-25 (×2): 1 [IU] via SUBCUTANEOUS

## 2016-01-23 MED ORDER — ONDANSETRON HCL 4 MG PO TABS: 4.0000 mg | ORAL_TABLET | Freq: Four times a day (QID) | ORAL | Status: DC | PRN

## 2016-01-23 NOTE — ED Notes (Signed)
PT HAS CONDOM CATH PERFORMED BY JANET LAWSON TO GRAVITY. PT TOLERATED.

## 2016-01-23 NOTE — ED Notes (Signed)
ED PA at bedside

## 2016-01-23 NOTE — ED Notes (Signed)
Per [facility] Pt resides at home. Pt c/o of BIL flank pain with painful urination, fever with chills, and constipation x 1 week. Pt has not taken anything for relief.

## 2016-01-23 NOTE — ED Notes (Signed)
ADMITTTING MD AWARE OF ELEVATED TEMP OF 102.2. AND TYLENOL MED GIVEN. NO ADDITIONAL ORDERS GIVEN.ALSO MADE AWARE OF CONDOM CATH

## 2016-01-23 NOTE — H&P (Signed)
History and Physical    Miguel Brady [id number] DOB: [birth year] DOA: 01/23/2016  Referring Provider: Dr. Alfonse Spruce   PCP: Miguel Mccreedy, MD   Patient coming from: Home  Chief Complaint: Abdominal pain, nausea/vomiting, chills  HPI: Miguel Brady is a 55 y.o. male with PMH significant for obesity, hypertension, diabetes, tobacco abuse, GERD, hyperlipidemia and traumatic brain injury; who presented to the emergency department secondary to one week of bilateral flank and right lower quadrant pain, with associated nausea, vomiting and chills. Patient also reports some darkening in the appearance of his urine. The pain is localized mainly in the right lower quadrant/suprapubic area, sharp/stabbing in quality, constant, 8 out of 10 in intensity, radiated to both flanks and associated with nausea and vomiting. Patient never took his temperature though has been feeling chills throughout this days. A lot of the vomiting has been Just food content and no blood. Patient denies chest pain, shortness of breath, hematuria, hematochezia, melena, cough, headaches, focal neurologic deficit or any other acute complaints.  ED Course: Patient has a CT abdomen and pelvis with contrast that demonstrated a cystitis with ureteritis, no stones appreciated. Patient with Sirs due to pyelonephritis. Cultures were taken, IV fluids initiated and empirically started on Rocephin. [facility] has been called to admit the patient for further evaluation and treatment.  Review of Systems:  All other systems reviewed and apart from HPI, are negative.  Past Medical History  Diagnosis Date  . Obesity   . Hypertension   . Diabetes mellitus   . Headache(784.0)   . TOBACCO ABUSE 12/23/2008  . GERD 05/05/2007  . TRIGGER FINGER 05/05/2007  . REACTIVE AIRWAY DISEASE 12/23/2008    pt denies.  no inhaler  . Hyperlipidemia   . Substance abuse     ETOH  . Tuberculosis     pos PPD  . Neuromuscular disorder ([place])    Pt had brain injury 04-02-2012 and pt has chronic left hip, leg and foot pain  . Neuropathy due to medical condition [facility])     bilateral feet    Past Surgical History  Procedure Laterality Date  . External fixation pelvis  04/03/2012    Procedure: EXTERNAL FIXATION PELVIS;  Surgeon: Rozanna Box, MD;  Location: [place];  Service: Orthopedics;;  . External fixation leg  04/03/2012    Procedure: EXTERNAL FIXATION LEG;  Surgeon: Rozanna Box, MD;  Location: [place];  Service: Orthopedics;  Laterality: Left;  Left femur  . Chest tube insertion  04/03/2012    Procedure: CHEST TUBE INSERTION;  Surgeon: Zenovia Jarred, MD;  Location: [place];  Service: General;  Laterality: Left;  . Incision and drainage of wound  04/03/2012    Procedure: IRRIGATION AND DEBRIDEMENT WOUND;  Surgeon: Otilio Connors, MD;  Location: [place];  Service: Neurosurgery;  Laterality: N/A;  Frontal.  . Tibia im nail insertion  04/07/2012    Procedure: INTRAMEDULLARY (IM) NAIL TIBIAL;  Surgeon: Rozanna Box, MD;  Location: [place];  Service: Orthopedics;  Laterality: Left;  . Femur im nail  04/07/2012    Procedure: INTRAMEDULLARY (IM) NAIL FEMORAL;  Surgeon: Rozanna Box, MD;  Location: [place];  Service: Orthopedics;  Laterality: Left;  . Orif patella  04/07/2012    Procedure: OPEN REDUCTION INTERNAL (ORIF) FIXATION PATELLA;  Surgeon: Rozanna Box, MD;  Location: [place];  Service: Orthopedics;  Laterality: Left;  . Orif pelvic fracture  04/07/2012    Procedure: OPEN REDUCTION INTERNAL FIXATION (ORIF) PELVIC FRACTURE;  Surgeon: Rozanna Box, MD;  Location: [place];  Service: Orthopedics;  Laterality: N/A;  Right and left sacroiliac screw pinning,Irrigation and debridebridement open tibia and femur,removal external fixator.  . Flexible bronchoscopy  04/07/2012    Procedure: FLEXIBLE BRONCHOSCOPY;  Surgeon: Zenovia Jarred, MD;  Location: [place];  Service: General;;  START TIME=1645 END TIME=1700  . Cholecystectomy  07/28/2012     Procedure: LAPAROSCOPIC CHOLECYSTECTOMY;  Surgeon: Harl Bowie, MD;  Location: [facility];  Service: General;  Laterality: N/A;     reports that he has been smoking Cigarettes.  He has a 15 pack-year smoking history. He has never used smokeless tobacco. He reports that he does not drink alcohol or use illicit drugs.  Allergies  Allergen Reactions  . Shellfish Allergy Anaphylaxis  . Penicillins Itching    Has patient had a PCN reaction causing immediate rash, facial/tongue/throat swelling, SOB or lightheadedness with hypotension: No Has patient had a PCN reaction causing severe rash involving mucus membranes or skin necrosis: No Has patient had a PCN reaction that required hospitalization No Has patient had a PCN reaction occurring within the last 10 years: No If all of the above answers are "NO", then may proceed with Cephalosporin use.    Family History  Problem Relation Age of Onset  . Cancer Father   . Colon cancer Father   . Esophageal cancer Neg Hx   . Rectal cancer Neg Hx   . Stomach cancer Neg Hx   . Colon cancer Paternal Uncle     pt thinks 8 pat uncles     Prior to Admission medications   Medication Sig Start Date End Date Taking? Authorizing Provider  amLODipine (NORVASC) 10 MG tablet Take 10 mg by mouth at bedtime.    Yes Historical Provider, MD  aspirin EC 81 MG tablet Take 81 mg by mouth at bedtime.    Yes Historical Provider, MD  cetirizine (ZYRTEC) 10 MG tablet Take 10 mg by mouth at bedtime.  09/22/15  Yes Historical Provider, MD  chlorzoxazone (PARAFON) 500 MG tablet Take by mouth 4 (four) times daily as needed for muscle spasms.   Yes Historical Provider, MD  cholecalciferol (VITAMIN D) 1000 UNITS tablet Take 1,000 Units by mouth at bedtime.    Yes Historical Provider, MD  metFORMIN (GLUCOPHAGE) 500 MG tablet Take 500 mg by mouth daily as needed (hyperglycemia).    Yes Historical Provider, MD  testosterone cypionate (DEPOTESTOSTERONE CYPIONATE) 200 MG/ML  injection Inject 1 mL into the muscle every 28 (twenty-eight) days. 09/21/15  Yes Historical Provider, MD  furosemide (LASIX) 40 MG tablet Take 40 mg by mouth at bedtime. 08/21/15   Historical Provider, MD  GRALISE 600 MG TABS Take 600-1,200 mg by mouth at bedtime.  07/07/15   Historical Provider, MD  lisinopril (PRINIVIL,ZESTRIL) 20 MG tablet Take 20 mg by mouth daily. 09/21/15   Historical Provider, MD  metFORMIN (GLUCOPHAGE) 1000 MG tablet Take 1,000 mg by mouth at bedtime. 07/07/15   Historical Provider, MD  oxyCODONE (ROXICODONE) 15 MG immediate release tablet Take 15 mg by mouth 3 (three) times daily as needed for pain.  09/25/15   Historical Provider, MD  pantoprazole (PROTONIX) 40 MG tablet Take 40 mg by mouth at bedtime.     Historical Provider, MD  potassium chloride (K-DUR,KLOR-CON) 10 MEQ tablet Take 10 mEq by mouth at bedtime.  09/21/15   Historical Provider, MD  pravastatin (PRAVACHOL) 20 MG tablet Take 20 mg by mouth at bedtime. 09/15/15  Historical Provider, MD    Physical Exam: Filed Vitals:   01/23/16 1600 01/23/16 1647 01/23/16 1708 01/23/16 1716  BP:  125/86 143/76   Pulse:  108 100   Temp: 99.6 F (37.6 C) 101.6 F (38.7 C)  102.2 F (39 C)  TempSrc: Oral Oral  Oral  Resp:  16 18   Height:      Weight:      SpO2:  96% 95%    Constitutional: Patient warm to touch and with positive fever on exam. Experienced chills and complaining of bilateral flank pains. No chest pain or shortness of breath reported. Oriented 3 and able to follow commands properly.  Eyes: PERRLA, lids and conjunctivae normal, no icterus, no nystagmus ENMT: Mucous membranes dry on exam. Posterior pharynx clear of any exudate or lesions. Normal hearing, no thrush.  Neck: normal, supple, no masses, no thyromegaly, no JVD Respiratory: clear to auscultation bilaterally, no wheezing, no crackles. Normal respiratory effort. No accessory muscle use.  Cardiovascular: S1 & S2 heard, regular rate and rhythm, no  murmurs / rubs / gallops. No extremity edema. 2+ pedal pulses. No carotid bruits.  Abdomen: Soft, positive bowel sounds. Patient with bilateral CVA tenderness and right lower quadrant discomfort and palpitation; no guarding.   Musculoskeletal: no clubbing / cyanosis. No joint deformity upper and lower extremities. Good ROM, no contractures. Normal muscle tone.  Skin: no rashes, lesions, ulcers. No induration Neurologic: CN 2-12 grossly intact. Sensation intact, DTR normal. Strength 4/5 in all 4 limbs, secondary to poor effort. No focal neurologic deficit appreciated Psychiatric: Normal judgment and insight. Alert and oriented x 3. Normal mood.    Labs on Admission: I have personally reviewed following labs and imaging studies  CBC:  Recent Labs Lab 01/23/16 1324  WBC 20.1*  NEUTROABS 14.9*  HGB 14.2  HCT 44.2  MCV 90.0  PLT 99991111   Basic Metabolic Panel:  Recent Labs Lab 01/23/16 1324  NA 142  K 3.3*  CL 102  CO2 29  GLUCOSE 127*  BUN 10  CREATININE 1.19  CALCIUM 8.9   GFR: Estimated Creatinine Clearance: 100 mL/min (by C-G formula based on Cr of 1.19).   Liver Function Tests:  Recent Labs Lab 01/23/16 1324  AST 15  ALT 14*  ALKPHOS 77  BILITOT 1.3*  PROT 8.7*  ALBUMIN 3.7    Recent Labs Lab 01/23/16 1325  LIPASE 25   Urine analysis:    Component Value Date/Time   COLORURINE ORANGE* 01/23/2016 1331   APPEARANCEUR CLOUDY* 01/23/2016 1331   LABSPEC 1.014 01/23/2016 1331   PHURINE 6.0 01/23/2016 1331   GLUCOSEU NEGATIVE 01/23/2016 1331   HGBUR LARGE* 01/23/2016 1331   HGBUR small 06/06/2010 1519   BILIRUBINUR NEGATIVE 01/23/2016 1331   KETONESUR NEGATIVE 01/23/2016 1331   PROTEINUR 100* 01/23/2016 1331   UROBILINOGEN 1.0 07/27/2012 1708   NITRITE POSITIVE* 01/23/2016 1331   LEUKOCYTESUR MODERATE* 01/23/2016 1331   Sepsis Labs: Lactic acid 1.49  Radiological Exams on Admission: Ct Abdomen Pelvis W Contrast  01/23/2016  CLINICAL DATA:  Right  lower quadrant pain. Bilateral flank tenderness. EXAM: CT ABDOMEN AND PELVIS WITH CONTRAST TECHNIQUE: Multidetector CT imaging of the abdomen and pelvis was performed using the standard protocol following bolus administration of intravenous contrast. CONTRAST:  11mL ISOVUE-300 IOPAMIDOL (ISOVUE-300) INJECTION 61% COMPARISON:  04/03/2012 FINDINGS: Lower chest and abdominal wall:  Wide necked fatty umbilical hernia. Hepatobiliary: No focal liver abnormality.No evidence of biliary obstruction or stone. Pancreas: Unremarkable. Spleen: Unremarkable. Adrenals/Urinary Tract: 13  mm left adrenal nodule, chronic based on 2013 CT and consistent with adenoma Thick walled bladder with perivesicular fat stranding. 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 Stranding tracks along the left more than right ureters. There is asymmetric left perinephric edema without striated nephrogram. No hydronephrosis. Reproductive:No pathologic findings. Stomach/Bowel:  No obstruction. No appendicitis. Vascular/Lymphatic: IVC filter in good position. No acute vascular abnormality. No mass or adenopathy. Peritoneal: No ascites or pneumoperitoneum. Musculoskeletal: Remote pelvic fractures with bilateral sacroiliac and symphysis pubis fusion. Remote left femoral neck fracture status post fixation. 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 Cystitis with left ureteritis and probable early pyelonephritis. No hydronephrosis. 2. 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 Electronically Signed   By: Monte Fantasia M.D.   On: 01/23/2016 15:00    EKG:  None  Assessment/Plan 1-SIRS (systemic inflammatory response syndrome) ([place]): Secondary to pyelonephritis. Patient with temperature of 102.2 and WBCs 20.1; meeting SIRS criteria. Normal lactic acid, nontoxic on examination. Normal respiratory  rate and heart rate of 95-100. -Patient will be admitted to [facility] -Will provide fluid resuscitation -Will check urine culture and blood culture -Empirically has been started on Rocephin -PRN antipyretics, antiemetics and analgesics will be provided -Will follow clinical response.  2-DM2 (diabetes mellitus, type 2) ([place]): -While inpatient will hold oral hypoglycemic agents -Will provide sliding scale insulin and will check hemoglobin A1c  3-HLD (hyperlipidemia): -Will continue statins and will check lipid panel in a.m.  4-OBESITY: -Low-calorie diet and exercise has been discussed with the patient -Body mass index is 41.86 kg/(m^2).  5-TOBACCO ABUSE: -Cessation counseling has been provided -Nicotine patch has been ordered  6-GERD: -Will continue PPI  7-HTN (hypertension): -Currently stable -Will continue Norvasc and lisinopril -Given signs of dehydration and ongoing UTI requiring IV fluids will hold Lasix for now.  8-Nausea with vomiting: -Secondary to pyelonephritis -Will provide as needed antiemetics and fluid resuscitation   DVT prophylaxis: Heparin  Code Status: Full code Family Communication: Mother at bedside Disposition Plan: Anticipate discharge home if no further complications in less than 2 midnights; patient's needs to be afebrile and able to tolerate by mouth's. Consults called: None Admission status:  Observation, length of stay less than 2 midnights, MedSurg   Barton Dubois MD [facility] Pager [phone]  If 7PM-7AM, please contact night-coverage [email or web address] Password [email or web address]  01/23/2016, 5:20 PM

## 2016-01-23 NOTE — Plan of Care (Signed)
Problem: Safety: Goal: Ability to remain free from injury will improve Outcome: Completed/Met Date Met:  01/23/16 Discussed safety plan. Pt was encouraged to call should pt need to get out of the bed

## 2016-01-23 NOTE — ED Notes (Signed)
Bed: WA23 Expected date:  Expected time:  Means of arrival:  Comments: EMS-[facility] 

## 2016-01-23 NOTE — ED Provider Notes (Signed)
CSN: [id number]     Arrival date & time 01/23/16  1210 History   First MD Initiated Contact with Patient 01/23/16 1229     Chief Complaint  Patient presents with  . Fever  . Flank Pain  . Urinary Tract Infection    x 1 week  . Constipation    x 1 week     (Consider location/radiation/quality/duration/timing/severity/associated sxs/prior Treatment) HPI Comments: Miguel Brady is a 55 y.o. 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 He describes the pain as 10/10 sharp constant pain in both flanks, radiating into the suprapubic area, worse with urination, will with no treatments tried prior to arrival. Associated symptoms include subjective fever, chills, dysuria, increased urinary frequency and urgency, and constipation although he states he had a bowel movement that was small just prior to arrival. He also reports that he stuck his finger in his throat in an attempt to vomit, vomited twice yesterday which is nonbloody and nonbilious, states that he has had no ongoing nausea or vomiting and not he did this to try to see if it would relieve his symptoms.  He denies any chest pain, shortness of breath, nausea, ongoing vomiting, diarrhea, obstipation, melena, hematochezia, hematuria, penile discharge, testicular pain or swelling, rectal pain, numbness, tingling, weakness, recent travel, sick contacts, suspicious food intake, alcohol use, or chronic NSAID use. He is not sexually active.  Patient is a 55 y.o. male presenting with fever, flank pain, urinary tract infection, and constipation. The history is provided by the patient. No language interpreter was used.  Fever Associated symptoms:  chills, dysuria and vomiting (forced emesis x2 yesterday)   Associated symptoms: no chest pain, no confusion, no diarrhea, no myalgias and no nausea   Flank Pain This is a new problem. The current episode started in the past 7 days. The problem occurs constantly. The problem has been unchanged. Associated symptoms include abdominal pain (suprapubic), chills, a fever (subjective), urinary symptoms and vomiting (forced emesis x2 yesterday). Pertinent negatives include no arthralgias, chest pain, myalgias, nausea, numbness or weakness. Exacerbated by: urination. He has tried nothing for the symptoms. The treatment provided no relief.  Urinary Tract Infection Associated symptoms include abdominal pain (suprapubic), chills, a fever (subjective), urinary symptoms and vomiting (forced emesis x2 yesterday). Pertinent negatives include no arthralgias, chest pain, myalgias, nausea, numbness or weakness.  Constipation Associated symptoms: abdominal pain (suprapubic), dysuria, fever (subjective) and vomiting (forced emesis x2 yesterday)   Associated symptoms: no diarrhea and no nausea     Past Medical History  Diagnosis Date  . Obesity   . Hypertension   . Diabetes mellitus   . Headache(784.0)   . TOBACCO ABUSE 12/23/2008  . GERD 05/05/2007  . TRIGGER FINGER 05/05/2007  . REACTIVE AIRWAY DISEASE 12/23/2008    pt denies.  no inhaler  . Hyperlipidemia   . Substance abuse     ETOH  . Tuberculosis     pos PPD  . Neuromuscular disorder ([place])     Pt had brain injury 04-02-2012 and pt has chronic left hip, leg and foot pain  . Neuropathy due to medical condition [facility])     bilateral feet   Past Surgical History  Procedure Laterality Date  .  External fixation pelvis  04/03/2012    Procedure: EXTERNAL FIXATION PELVIS;  Surgeon: Rozanna Box, MD;  Location: [place];  Service: Orthopedics;;  . External fixation leg  04/03/2012    Procedure: EXTERNAL FIXATION LEG;  Surgeon: Rozanna Box, MD;  Location: [place];   Service: Orthopedics;  Laterality: Left;  Left femur  . Chest tube insertion  04/03/2012    Procedure: CHEST TUBE INSERTION;  Surgeon: Zenovia Jarred, MD;  Location: [place];  Service: General;  Laterality: Left;  . Incision and drainage of wound  04/03/2012    Procedure: IRRIGATION AND DEBRIDEMENT WOUND;  Surgeon: Otilio Connors, MD;  Location: [place];  Service: Neurosurgery;  Laterality: N/A;  Frontal.  . Tibia im nail insertion  04/07/2012    Procedure: INTRAMEDULLARY (IM) NAIL TIBIAL;  Surgeon: Rozanna Box, MD;  Location: [place];  Service: Orthopedics;  Laterality: Left;  . Femur im nail  04/07/2012    Procedure: INTRAMEDULLARY (IM) NAIL FEMORAL;  Surgeon: Rozanna Box, MD;  Location: [place];  Service: Orthopedics;  Laterality: Left;  . Orif patella  04/07/2012    Procedure: OPEN REDUCTION INTERNAL (ORIF) FIXATION PATELLA;  Surgeon: Rozanna Box, MD;  Location: [place];  Service: Orthopedics;  Laterality: Left;  . Orif pelvic fracture  04/07/2012    Procedure: OPEN REDUCTION INTERNAL FIXATION (ORIF) PELVIC FRACTURE;  Surgeon: Rozanna Box, MD;  Location: [place];  Service: Orthopedics;  Laterality: N/A;  Right and left sacroiliac screw pinning,Irrigation and debridebridement open tibia and femur,removal external fixator.  . Flexible bronchoscopy  04/07/2012    Procedure: FLEXIBLE BRONCHOSCOPY;  Surgeon: Zenovia Jarred, MD;  Location: [place];  Service: General;;  START TIME=1645 END TIME=1700  . Cholecystectomy  07/28/2012    Procedure: LAPAROSCOPIC CHOLECYSTECTOMY;  Surgeon: Harl Bowie, MD;  Location: [facility];  Service: General;  Laterality: N/A;   Family History  Problem Relation Age of Onset  . Cancer Father   . Colon cancer Father   . Esophageal cancer Neg Hx   . Rectal cancer Neg Hx   . Stomach cancer Neg Hx   . Colon cancer Paternal Uncle     pt thinks 8 pat uncles   Social History  Substance Use Topics  . Smoking status: Current Every Day Smoker -- 0.50 packs/day for  30 years    Types: Cigarettes    Last Attempt to Quit: 04/02/2012  . Smokeless tobacco: Never Used  . Alcohol Use: No     Comment: Pt denies alcohol consumption for 2 years    Review of Systems  Constitutional: Positive for fever (subjective) and chills.  Respiratory: Negative for shortness of breath.   Cardiovascular: Negative for chest pain.  Gastrointestinal: Positive for vomiting (forced emesis x2 yesterday), abdominal pain (suprapubic) and constipation (BM prior to arrival). Negative for nausea, diarrhea, blood in stool, anal bleeding and rectal pain.  Genitourinary: Positive for dysuria, urgency, frequency and flank pain. Negative for hematuria, discharge, scrotal swelling and testicular pain.  Musculoskeletal: Negative for myalgias and arthralgias.  Skin: Negative for color change.  Allergic/Immunologic: Positive for immunocompromised state (DM2).  Neurological: Negative for weakness and numbness.  Psychiatric/Behavioral: Negative for confusion.   10 Systems reviewed and are negative for acute change except as noted in the HPI.    Allergies  Shellfish allergy and Penicillins  Home Medications   Prior to Admission medications   Medication Sig Start Date End Date Taking? Authorizing Provider  amLODipine (NORVASC) 10  MG tablet Take 10 mg by mouth at bedtime.     Historical Provider, MD  aspirin EC 81 MG tablet Take 81 mg by mouth at bedtime.     Historical Provider, MD  cetirizine (ZYRTEC) 10 MG tablet Take 10 mg by mouth at bedtime.  09/22/15   Historical Provider, MD  chlorzoxazone (PARAFON) 500 MG tablet Take by mouth 4 (four) times daily as needed for muscle spasms.    Historical Provider, MD  cholecalciferol (VITAMIN D) 1000 UNITS tablet Take 1,000 Units by mouth at bedtime.     Historical Provider, MD  doxycycline (DORYX) 100 MG EC tablet Take 1 tablet (100 mg total) by mouth 2 (two) times daily with a meal. For 7days 10/01/15   Domenic Polite, MD  furosemide (LASIX) 40 MG  tablet Take 40 mg by mouth at bedtime. 08/21/15   Historical Provider, MD  GRALISE 600 MG TABS Take 600-1,200 mg by mouth at bedtime.  07/07/15   Historical Provider, MD  lisinopril (PRINIVIL,ZESTRIL) 20 MG tablet Take 20 mg by mouth daily. 09/21/15   Historical Provider, MD  metFORMIN (GLUCOPHAGE) 1000 MG tablet Take 1,000 mg by mouth at bedtime. 07/07/15   Historical Provider, MD  metFORMIN (GLUCOPHAGE) 500 MG tablet Take 500 mg by mouth daily as needed (hyperglycemia).     Historical Provider, MD  oxyCODONE (ROXICODONE) 15 MG immediate release tablet Take 15 mg by mouth 3 (three) times daily as needed for pain.  09/25/15   Historical Provider, MD  pantoprazole (PROTONIX) 40 MG tablet Take 40 mg by mouth at bedtime.     Historical Provider, MD  potassium chloride (K-DUR,KLOR-CON) 10 MEQ tablet Take 10 mEq by mouth at bedtime.  09/21/15   Historical Provider, MD  pravastatin (PRAVACHOL) 20 MG tablet Take 20 mg by mouth at bedtime. 09/15/15   Historical Provider, MD  testosterone cypionate (DEPOTESTOSTERONE CYPIONATE) 200 MG/ML injection Inject 1 mL into the muscle every 28 (twenty-eight) days. 09/21/15   Historical Provider, MD   BP 127/84 mmHg  Pulse 92  Temp(Src) 99 F (37.2 C) (Oral)  Resp 16  SpO2 97% Physical Exam  Constitutional: He is oriented to person, place, and time. Vital signs are normal. He appears well-developed and well-nourished.  Non-toxic appearance. No distress.  Low-grade temp 46F, nontoxic, NAD  HENT:  Head: Normocephalic and atraumatic.  Mouth/Throat: Oropharynx is clear and moist and mucous membranes are normal.  Eyes: Conjunctivae and EOM are normal. Right eye exhibits no discharge. Left eye exhibits no discharge.  Neck: Normal range of motion. Neck supple.  Cardiovascular: Normal rate, regular rhythm, normal heart sounds and intact distal pulses.  Exam reveals no gallop and no friction rub.   No murmur heard. Pulmonary/Chest: Effort normal and breath sounds normal. No  respiratory distress. He has no decreased breath sounds. He has no wheezes. He has no rhonchi. He has no rales.  Abdominal: Soft. Normal appearance and bowel sounds are normal. He exhibits no distension. There is tenderness in the right lower quadrant. There is CVA tenderness and tenderness at McBurney's point. There is no rigidity, no rebound, no guarding and negative Murphy's sign.  Soft, obese but not obviously distended, +BS throughout, with moderate RLQ TTP near mcburney's point, no r/g/r, neg murphy's, with moderate b/l CVA TTP   Musculoskeletal: Normal range of motion.  Neurological: He is alert and oriented to person, place, and time. He has normal strength. No sensory deficit.  Skin: Skin is warm, dry and intact. No rash noted.  Psychiatric: He has a normal mood and affect.  Nursing note and vitals reviewed.   ED Course  Procedures (including critical care time) Labs Review Labs Reviewed  URINALYSIS, ROUTINE W REFLEX MICROSCOPIC (NOT AT [facility]) - Abnormal; Notable for the following:    Color, Urine ORANGE (*)    APPearance CLOUDY (*)    Hgb urine dipstick LARGE (*)    Protein, ur 100 (*)    Nitrite POSITIVE (*)    Leukocytes, UA MODERATE (*)    All other components within normal limits  CBC WITH DIFFERENTIAL/PLATELET - Abnormal; Notable for the following:    WBC 20.1 (*)    Neutro Abs 14.9 (*)    Monocytes Absolute 2.4 (*)    All other components within normal limits  COMPREHENSIVE METABOLIC PANEL - Abnormal; Notable for the following:    Potassium 3.3 (*)    Glucose, Bld 127 (*)    Total Protein 8.7 (*)    ALT 14 (*)    Total Bilirubin 1.3 (*)    All other components within normal limits  URINE MICROSCOPIC-ADD ON - Abnormal; Notable for the following:    Squamous Epithelial / LPF 6-30 (*)    Bacteria, UA MANY (*)    All other components within normal limits  URINE CULTURE  LIPASE, BLOOD  I-STAT CG4 LACTIC ACID, ED    Imaging Review Ct Abdomen Pelvis W  Contrast  01/23/2016  CLINICAL DATA:  Right lower quadrant pain. Bilateral flank tenderness. EXAM: CT ABDOMEN AND PELVIS WITH CONTRAST TECHNIQUE: Multidetector CT imaging of the abdomen and pelvis was performed using the standard protocol following bolus administration of intravenous contrast. CONTRAST:  131mL ISOVUE-300 IOPAMIDOL (ISOVUE-300) INJECTION 61% COMPARISON:  04/03/2012 FINDINGS: Lower chest and abdominal wall:  Wide necked fatty umbilical hernia. Hepatobiliary: No focal liver abnormality.No evidence of biliary obstruction or stone. Pancreas: Unremarkable. Spleen: Unremarkable. Adrenals/Urinary Tract: 13 mm left adrenal nodule, chronic based on 2013 CT and consistent with adenoma Thick walled bladder with perivesicular fat stranding. 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 Stranding tracks along the left more than right ureters. There is asymmetric left perinephric edema without striated nephrogram. No hydronephrosis. Reproductive:No pathologic findings. Stomach/Bowel:  No obstruction. No appendicitis. Vascular/Lymphatic: IVC filter in good position. No acute vascular abnormality. No mass or adenopathy. Peritoneal: No ascites or pneumoperitoneum. Musculoskeletal: Remote pelvic fractures with bilateral sacroiliac and symphysis pubis fusion. Remote left femoral neck fracture status post fixation. 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 Cystitis with left ureteritis and probable early pyelonephritis. No hydronephrosis. 2. 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 Electronically Signed   By: Monte Fantasia M.D.   On: 01/23/2016 15:00   I have personally reviewed and evaluated these images and lab results as part of my medical decision-making.   EKG Interpretation None      MDM   Final diagnoses:  Pyelonephritis  UTI (lower urinary  tract infection)  Other constipation  Fever, unspecified fever cause  Left flank pain  Leukocytosis  Bladder diverticulum  Hypokalemia    55 y.o. male here with one week of bilateral flank pain radiating into the suprapubic area, dysuria, increased urinary frequency and urgency, and chills. Subjective fever at home. States he forced himself to vomit twice yesterday but this did not relieve his symptoms. Also reports he has been constipated, but had a small bowel movement right prior to arrival.  On exam, bilateral CVA tenderness, right lower quadrant tenderness with no rebound guarding or rigidity. Likely etiology is TR/Pilo, but appendicitis is on the differential given the subjective fevers and right lower quadrant tenderness. Will proceed with CT imaging to fully evaluate stool burden, kidneys, and appendix. Will get labs, lactate, and UA as well as urine culture. Will give fluids and morphine. Will reassess shortly.  3:23 PM Lactic 1.49 which is reassuring. U/A with +nitrites +leuks TNTC WBC and many bacteria consistent with UTI/pyelo. Lipase WNL. CBC w/diff showing leukocytosis [id number] with neutrophilic predominance. CMP with K 3.3, bili minimally elevated likely from dehydration, gluc 127. 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 All exam findings consistent with pyelo. Rocephin IV ordered and is being given. Temp rising to 100.1, will give tylenol. Pt still in significant pain, and doesn't feel like he could go home and take care of himself-- given that he's a diabetic with pyelo, he could be managed inpatient to ensure proper treatment. Will admit to hospitalist.   4:03 PM Dr. Dyann Kief returning page, will admit for overnight obs admission and IV abx. Please see his notes for further documentation of care. Pt stable at this time.  BP 118/68 mmHg  Pulse 92  Temp(Src)  99.6 F (37.6 C) (Oral)  Resp 18  Ht 5\' 11"  (1.803 m)  Wt 136.079 kg  BMI 41.86 kg/m2  SpO2 92%  Meds ordered this encounter  Medications  . sodium chloride 0.9 % bolus 1,000 mL    Sig:   . morphine 4 MG/ML injection 4 mg    Sig:   . iopamidol (ISOVUE-300) 61 % injection 100 mL    Sig:   . cefTRIAXone (ROCEPHIN) 1 g in dextrose 5 % 50 mL IVPB    Sig:   . acetaminophen (TYLENOL) tablet 650 mg    Sig:   . HYDROmorphone (DILAUDID) injection 1 mg    Sig:   . potassium chloride SA (K-DUR,KLOR-CON) CR tablet 20 mEq    Sig:        Zailey Audia Camprubi-Soms, PA-C 01/23/16 1610  Harvel Quale, MD 01/25/16 1654

## 2016-01-24 DIAGNOSIS — N10 Acute tubulo-interstitial nephritis: Secondary | ICD-10-CM | POA: Diagnosis present

## 2016-01-24 DIAGNOSIS — E785 Hyperlipidemia, unspecified: Secondary | ICD-10-CM | POA: Diagnosis present

## 2016-01-24 DIAGNOSIS — A4189 Other specified sepsis: Secondary | ICD-10-CM | POA: Diagnosis present

## 2016-01-24 DIAGNOSIS — K219 Gastro-esophageal reflux disease without esophagitis: Secondary | ICD-10-CM | POA: Diagnosis present

## 2016-01-24 DIAGNOSIS — N179 Acute kidney failure, unspecified: Secondary | ICD-10-CM | POA: Diagnosis present

## 2016-01-24 DIAGNOSIS — Z79899 Other long term (current) drug therapy: Secondary | ICD-10-CM | POA: Diagnosis not present

## 2016-01-24 DIAGNOSIS — N323 Diverticulum of bladder: Secondary | ICD-10-CM | POA: Diagnosis present

## 2016-01-24 DIAGNOSIS — K5909 Other constipation: Secondary | ICD-10-CM | POA: Diagnosis present

## 2016-01-24 DIAGNOSIS — R946 Abnormal results of thyroid function studies: Secondary | ICD-10-CM | POA: Diagnosis present

## 2016-01-24 DIAGNOSIS — A419 Sepsis, unspecified organism: Secondary | ICD-10-CM | POA: Diagnosis present

## 2016-01-24 DIAGNOSIS — N5089 Other specified disorders of the male genital organs: Secondary | ICD-10-CM | POA: Diagnosis present

## 2016-01-24 DIAGNOSIS — Z6841 Body Mass Index (BMI) 40.0 and over, adult: Secondary | ICD-10-CM | POA: Diagnosis not present

## 2016-01-24 DIAGNOSIS — E669 Obesity, unspecified: Secondary | ICD-10-CM | POA: Diagnosis present

## 2016-01-24 DIAGNOSIS — E876 Hypokalemia: Secondary | ICD-10-CM | POA: Diagnosis present

## 2016-01-24 DIAGNOSIS — I10 Essential (primary) hypertension: Secondary | ICD-10-CM | POA: Diagnosis present

## 2016-01-24 DIAGNOSIS — Z880 Allergy status to penicillin: Secondary | ICD-10-CM | POA: Diagnosis not present

## 2016-01-24 DIAGNOSIS — Z87820 Personal history of traumatic brain injury: Secondary | ICD-10-CM | POA: Diagnosis not present

## 2016-01-24 DIAGNOSIS — N12 Tubulo-interstitial nephritis, not specified as acute or chronic: Secondary | ICD-10-CM | POA: Diagnosis not present

## 2016-01-24 DIAGNOSIS — E860 Dehydration: Secondary | ICD-10-CM | POA: Diagnosis present

## 2016-01-24 DIAGNOSIS — E119 Type 2 diabetes mellitus without complications: Secondary | ICD-10-CM | POA: Diagnosis present

## 2016-01-24 DIAGNOSIS — Z79891 Long term (current) use of opiate analgesic: Secondary | ICD-10-CM | POA: Diagnosis not present

## 2016-01-24 DIAGNOSIS — R6510 Systemic inflammatory response syndrome (SIRS) of non-infectious origin without acute organ dysfunction: Secondary | ICD-10-CM | POA: Diagnosis not present

## 2016-01-24 DIAGNOSIS — Z7984 Long term (current) use of oral hypoglycemic drugs: Secondary | ICD-10-CM | POA: Diagnosis not present

## 2016-01-24 DIAGNOSIS — Z91013 Allergy to seafood: Secondary | ICD-10-CM | POA: Diagnosis not present

## 2016-01-24 DIAGNOSIS — Z794 Long term (current) use of insulin: Secondary | ICD-10-CM | POA: Diagnosis not present

## 2016-01-24 DIAGNOSIS — Z7982 Long term (current) use of aspirin: Secondary | ICD-10-CM | POA: Diagnosis not present

## 2016-01-24 LAB — CBC
HEMATOCRIT: 39.3 % (ref 39.0–52.0)
Hemoglobin: 12.5 g/dL — ABNORMAL LOW (ref 13.0–17.0)
MCH: 28.3 pg (ref 26.0–34.0)
MCHC: 31.8 g/dL (ref 30.0–36.0)
MCV: 89.1 fL (ref 78.0–100.0)
Platelets: 280 10*3/uL (ref 150–400)
RBC: 4.41 MIL/uL (ref 4.22–5.81)
RDW: 15.1 % (ref 11.5–15.5)
WBC: 20.4 10*3/uL — ABNORMAL HIGH (ref 4.0–10.5)

## 2016-01-24 LAB — BASIC METABOLIC PANEL
Anion gap: 12 (ref 5–15)
BUN: 9 mg/dL (ref 6–20)
CO2: 26 mmol/L (ref 22–32)
Calcium: 8.1 mg/dL — ABNORMAL LOW (ref 8.9–10.3)
Chloride: 106 mmol/L (ref 101–111)
Creatinine, Ser: 1.18 mg/dL (ref 0.61–1.24)
GFR calc Af Amer: >60 mL/min (ref >60)
GLUCOSE: 125 mg/dL — AB (ref 65–99)
POTASSIUM: 3.4 mmol/L — AB (ref 3.5–5.1)
Sodium: 144 mmol/L (ref 135–145)

## 2016-01-24 LAB — LIPID PANEL
Cholesterol: 89 mg/dL (ref 0–200)
HDL: 29 mg/dL — ABNORMAL LOW (ref >40)
LDL CALC: 49 mg/dL (ref 0–99)
Total CHOL/HDL Ratio: 3.1 RATIO
Triglycerides: 57 mg/dL (ref <150)
VLDL: 11 mg/dL (ref 0–40)

## 2016-01-24 LAB — GLUCOSE, CAPILLARY
GLUCOSE-CAPILLARY: 108 mg/dL — AB (ref 65–99)
GLUCOSE-CAPILLARY: 118 mg/dL — AB (ref 65–99)
Glucose-Capillary: 122 mg/dL — ABNORMAL HIGH (ref 65–99)
Glucose-Capillary: 113 mg/dL — ABNORMAL HIGH (ref 65–99)

## 2016-01-24 MED ORDER — POTASSIUM CHLORIDE CRYS ER 20 MEQ PO TBCR
40.0000 meq | EXTENDED_RELEASE_TABLET | Freq: Four times a day (QID) | ORAL | Status: AC
  Administered 2016-01-24 (×2): 40 meq via ORAL
  Filled 2016-01-24: qty 2

## 2016-01-24 MED ORDER — DIPHENHYDRAMINE HCL 25 MG PO CAPS
25.0000 mg | ORAL_CAPSULE | ORAL | Status: DC | PRN
  Administered 2016-01-24 – 2016-01-25 (×2): 25 mg via ORAL
  Filled 2016-01-24 (×2): qty 1

## 2016-01-24 MED ORDER — MAGNESIUM SULFATE 2 GM/50ML IV SOLN
2.0000 g | Freq: Once | INTRAVENOUS | Status: AC
  Administered 2016-01-24: 2 g via INTRAVENOUS
  Filled 2016-01-24: qty 50

## 2016-01-24 NOTE — Progress Notes (Signed)
PROGRESS NOTE  Miguel Brady  [id number] DOB: [birth year] DOA: 01/23/2016 PCP: Benito Mccreedy, MD  Subjective: Had fever of 102.9 earlier today.  Brief Narrative:  55 years old with history of traumatic brain injury, obesity and hypertension came to the hospital complaining about bilateral flank pain, urinalysis consistent with UTI started on Rocephin.  Assessment & Plan:   Principal Problem:   SIRS (systemic inflammatory response syndrome) (HCC) Active Problems:   DM2 (diabetes mellitus, type 2) (HCC)   HLD (hyperlipidemia)   OBESITY   TOBACCO ABUSE   GERD   HTN (hypertension)   Pyelonephritis   Nausea with vomiting   Sepsis Presented with with temperature of 102.2 and WBCs 20.1 and the presence of a UTI/pyelonephritis. No evidence of end organ damage, normal lactic acid. Foley and resuscitation with IV fluids, started on antibiotics. Sepsis physiology resolved.  Acute pyelonephritis Urinalysis consistent with UTI, bilateral flank pain, CT scan showed left more than right evidence of early pyelonephritis. Started on Rocephin, will continue. Adjust antibiotics according to culture results, symptomatic management with antipyretics, antiemetics and analgesics.  DM2 (diabetes mellitus, type 2) ([place]): -While inpatient will hold oral hypoglycemic agents -Will provide sliding scale insulin and will check hemoglobin A1c  HLD (hyperlipidemia): -Will continue statins and will check lipid panel in a.m.  OBESITY: -Low-calorie diet and exercise has been discussed with the patient -Body mass index is 41.86 kg/(m^2).  TOBACCO ABUSE: -Cessation counseling has been provided -Nicotine patch has been ordered  GERD: -Will continue PPI  HTN (hypertension): -Currently stable -Will continue Norvasc and lisinopril -Was given IV fluids, discontinue keep Lasix on hold.  Nausea with vomiting: -Secondary to pyelonephritis -Will provide as needed antiemetics and fluid  resuscitation  Abnormal TSH Low TSH at 0.227, check total T3 and free T4.  Hypokalemia and hypomagnesemia Likely secondary to nausea and vomiting, replete and check in a.m.  DVT prophylaxis:  Code Status: Full Code Family Communication: Plan discussed with the patient Disposition Plan: Await culture results Diet: Diet Carb Modified Fluid consistency:: Thin; Room service appropriate?: Yes  Consultants:   None  Procedures:   None  Antimicrobials:   Rocephin  Objective: Filed Vitals:   01/23/16 1900 01/23/16 2313 01/24/16 0522 01/24/16 0730  BP: 170/84 150/85 142/72 107/66  Pulse:  96 121 110  Temp:  99.4 F (37.4 C) 102.9 F (39.4 C) 100.3 F (37.9 C)  TempSrc:  Oral Axillary Oral  Resp:  18 20 22   Height:      Weight:      SpO2:  100% 95% 98%    Intake/Output Summary (Last 24 hours) at 01/24/16 1130 Last data filed at 01/24/16 0700  Gross per 24 hour  Intake 2653.75 ml  Output   1300 ml  Net 1353.75 ml   Filed Weights   01/23/16 1507 01/23/16 1742  Weight: 136.079 kg (300 lb) 136.079 kg (300 lb)    Examination: General exam: Appears calm and comfortable  Respiratory system: Clear to auscultation. Respiratory effort normal. Cardiovascular system: S1 & S2 heard, RRR. No JVD, murmurs, rubs, gallops or clicks. No pedal edema. Gastrointestinal system: Abdomen is nondistended, soft and nontender. No organomegaly or masses felt. Normal bowel sounds heard. Central nervous system: Alert and oriented. No focal neurological deficits. Extremities: Symmetric 5 x 5 power. Skin: No rashes, lesions or ulcers Psychiatry: Judgement and insight appear normal. Mood & affect appropriate.   Data Reviewed: I have personally reviewed following labs and imaging studies  CBC:  Recent Labs Lab 01/23/16 1324  01/24/16 0525  WBC 20.1* 20.4*  NEUTROABS 14.9*  --   HGB 14.2 12.5*  HCT 44.2 39.3  MCV 90.0 89.1  PLT 302 [id number]   Basic Metabolic Panel:  Recent Labs Lab  01/23/16 1324 01/23/16 1806 01/24/16 0525  NA 142  --  144  K 3.3*  --  3.4*  CL 102  --  106  CO2 29  --  26  GLUCOSE 127*  --  125*  BUN 10  --  9  CREATININE 1.19  --  1.18  CALCIUM 8.9  --  8.1*  MG  --  1.5*  --   PHOS  --  2.7  --    GFR: Estimated Creatinine Clearance: 100.8 mL/min (by C-G formula based on Cr of 1.18). Liver Function Tests:  Recent Labs Lab 01/23/16 1324 01/23/16 1806  AST 15 13*  ALT 14* 12*  ALKPHOS 77 66  BILITOT 1.3* 1.5*  PROT 8.7* 7.8  ALBUMIN 3.7 3.3*    Recent Labs Lab 01/23/16 1325  LIPASE 25   No results for input(s): AMMONIA in the last 168 hours. Coagulation Profile: No results for input(s): INR, PROTIME in the last 168 hours. Cardiac Enzymes: No results for input(s): CKTOTAL, CKMB, CKMBINDEX, TROPONINI in the last 168 hours. BNP (last 3 results) No results for input(s): PROBNP in the last 8760 hours. HbA1C: No results for input(s): HGBA1C in the last 72 hours. CBG:  Recent Labs Lab 01/23/16 1838 01/23/16 2318 01/24/16 0747  GLUCAP 138* 100* 108*   Lipid Profile:  Recent Labs  01/24/16 0517  CHOL 89  HDL 29*  LDLCALC 49  TRIG 57  CHOLHDL 3.1   Thyroid Function Tests:  Recent Labs  01/23/16 1806  TSH 0.227*   Anemia Panel: No results for input(s): VITAMINB12, FOLATE, FERRITIN, TIBC, IRON, RETICCTPCT in the last 72 hours. Urine analysis:    Component Value Date/Time   COLORURINE ORANGE* 01/23/2016 1331   APPEARANCEUR CLOUDY* 01/23/2016 1331   LABSPEC 1.014 01/23/2016 1331   PHURINE 6.0 01/23/2016 1331   GLUCOSEU NEGATIVE 01/23/2016 1331   HGBUR LARGE* 01/23/2016 1331   HGBUR small 06/06/2010 1519   BILIRUBINUR NEGATIVE 01/23/2016 1331   KETONESUR NEGATIVE 01/23/2016 1331   PROTEINUR 100* 01/23/2016 1331   UROBILINOGEN 1.0 07/27/2012 1708   NITRITE POSITIVE* 01/23/2016 1331   LEUKOCYTESUR MODERATE* 01/23/2016 1331   Sepsis Labs: @LABRCNTIP (procalcitonin:4,lacticidven:4)  ) Recent Results  (from the past 240 hour(s))  MRSA PCR Screening     Status: None   Collection Time: 01/23/16  5:53 PM  Result Value Ref Range Status   MRSA by PCR NEGATIVE NEGATIVE Final    Comment:        The GeneXpert MRSA Assay (FDA approved for NASAL specimens only), is one component of a comprehensive MRSA colonization surveillance program. It is not intended to diagnose MRSA infection nor to guide or monitor treatment for MRSA infections.   Culture, blood (Routine X 2) w Reflex to ID Panel     Status: None (Preliminary result)   Collection Time: 01/23/16  6:06 PM  Result Value Ref Range Status   Specimen Description   Final    BLOOD LEFT ANTECUBITAL Performed at [facility]    Special Requests BOTTLES DRAWN AEROBIC AND ANAEROBIC 5CC  Final   Culture PENDING  Incomplete   Report Status PENDING  Incomplete     Invalid input(s): PROCALCITONIN, LACTICACIDVEN   Radiology Studies: Ct Abdomen Pelvis W Contrast  01/23/2016  CLINICAL DATA:  Right lower quadrant pain. Bilateral flank tenderness. EXAM: CT ABDOMEN AND PELVIS WITH CONTRAST TECHNIQUE: Multidetector CT imaging of the abdomen and pelvis was performed using the standard protocol following bolus administration of intravenous contrast. CONTRAST:  157mL ISOVUE-300 IOPAMIDOL (ISOVUE-300) INJECTION 61% COMPARISON:  04/03/2012 FINDINGS: Lower chest and abdominal wall:  Wide necked fatty umbilical hernia. Hepatobiliary: No focal liver abnormality.No evidence of biliary obstruction or stone. Pancreas: Unremarkable. Spleen: Unremarkable. Adrenals/Urinary Tract: 13 mm left adrenal nodule, chronic based on 2013 CT and consistent with adenoma Thick walled bladder with perivesicular fat stranding. 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 Stranding tracks along the left more than right ureters. There is asymmetric left perinephric edema without striated nephrogram. No hydronephrosis. Reproductive:No pathologic findings.  Stomach/Bowel:  No obstruction. No appendicitis. Vascular/Lymphatic: IVC filter in good position. No acute vascular abnormality. No mass or adenopathy. Peritoneal: No ascites or pneumoperitoneum. Musculoskeletal: Remote pelvic fractures with bilateral sacroiliac and symphysis pubis fusion. Remote left femoral neck fracture status post fixation. 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 Cystitis with left ureteritis and probable early pyelonephritis. No hydronephrosis. 2. 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 Electronically Signed   By: Monte Fantasia M.D.   On: 01/23/2016 15:00        Scheduled Meds: . amLODipine  10 mg Oral QHS  . aspirin EC  81 mg Oral QHS  . cefTRIAXone (ROCEPHIN)  IV  1 g Intravenous Q24H  . cholecalciferol  1,000 Units Oral QHS  . folic acid  1 mg Oral Daily  . gabapentin  600 mg Oral QHS  . heparin  5,000 Units Subcutaneous Q8H  . insulin aspart  0-5 Units Subcutaneous QHS  . insulin aspart  0-9 Units Subcutaneous TID WC  . lisinopril  20 mg Oral Daily  . loratadine  10 mg Oral Daily  . nicotine  14 mg Transdermal Daily  . pantoprazole  40 mg Oral QHS  . potassium chloride  40 mEq Oral Q6H  . pravastatin  20 mg Oral QHS  . thiamine  100 mg Oral Daily   Continuous Infusions: . sodium chloride 75 mL/hr at 01/24/16 0526        Time spent: 35 minutes    Leonardo Makris A, MD [facility] Pager [phone]  If 7PM-7AM, please contact night-coverage [email or web address] Password [email or web address] 01/24/2016, 11:30 AM
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
## 2016-01-25 ENCOUNTER — Inpatient Hospital Stay (HOSPITAL_COMMUNITY): Payer: Medicaid Other

## 2016-01-25 DIAGNOSIS — A419 Sepsis, unspecified organism: Secondary | ICD-10-CM

## 2016-01-25 LAB — CBC
HCT: 37.1 % — ABNORMAL LOW (ref 39.0–52.0)
Hemoglobin: 11.7 g/dL — ABNORMAL LOW (ref 13.0–17.0)
MCH: 28.5 pg (ref 26.0–34.0)
MCHC: 31.5 g/dL (ref 30.0–36.0)
MCV: 90.3 fL (ref 78.0–100.0)
Platelets: 273 10*3/uL (ref 150–400)
RBC: 4.11 MIL/uL — AB (ref 4.22–5.81)
RDW: 15.2 % (ref 11.5–15.5)
WBC: 20.9 10*3/uL — AB (ref 4.0–10.5)

## 2016-01-25 LAB — GLUCOSE, CAPILLARY
GLUCOSE-CAPILLARY: 146 mg/dL — AB (ref 65–99)
GLUCOSE-CAPILLARY: 138 mg/dL — AB (ref 65–99)
Glucose-Capillary: 120 mg/dL — ABNORMAL HIGH (ref 65–99)

## 2016-01-25 LAB — BASIC METABOLIC PANEL
Anion gap: 10 (ref 5–15)
BUN: 14 mg/dL (ref 6–20)
CO2: 26 mmol/L (ref 22–32)
CREATININE: 1.25 mg/dL — AB (ref 0.61–1.24)
Calcium: 8.1 mg/dL — ABNORMAL LOW (ref 8.9–10.3)
Chloride: 105 mmol/L (ref 101–111)
Glucose, Bld: 157 mg/dL — ABNORMAL HIGH (ref 65–99)
POTASSIUM: 3.9 mmol/L (ref 3.5–5.1)
SODIUM: 141 mmol/L (ref 135–145)

## 2016-01-25 LAB — URINE CULTURE: Culture: >=100000 — AB

## 2016-01-25 LAB — HEMOGLOBIN A1C
HEMOGLOBIN A1C: 7 % — AB (ref 4.8–5.6)
MEAN PLASMA GLUCOSE: 154 mg/dL

## 2016-01-25 LAB — MAGNESIUM: MAGNESIUM: 2.1 mg/dL (ref 1.7–2.4)

## 2016-01-25 MED ORDER — SODIUM CHLORIDE 0.9 % IV SOLN
INTRAVENOUS | Status: DC
  Administered 2016-01-25 (×2): via INTRAVENOUS

## 2016-01-25 MED ORDER — POTASSIUM CHLORIDE CRYS ER 20 MEQ PO TBCR
40.0000 meq | EXTENDED_RELEASE_TABLET | Freq: Once | ORAL | Status: AC
  Administered 2016-01-25: 40 meq via ORAL
  Filled 2016-01-25: qty 2
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
## 2016-01-25 NOTE — Progress Notes (Signed)
PROGRESS NOTE  Miguel Brady  [id number] DOB: Feb 07, 1961 DOA: 01/23/2016 PCP: Benito Mccreedy, MD  Subjective: No fever overnight, feels much better. Reported swelling in his scrotum.  Brief Narrative:  55 years old with history of traumatic brain injury, obesity and hypertension came to the hospital complaining about bilateral flank pain, urinalysis consistent with UTI started on Rocephin.  Assessment & Plan:   Principal Problem:   Sepsis ([place]) Active Problems:   DM2 (diabetes mellitus, type 2) (HCC)   HLD (hyperlipidemia)   OBESITY   TOBACCO ABUSE   GERD   HTN (hypertension)   Pyelonephritis   Nausea with vomiting   Sepsis Presented with with temperature of 102.2 and WBCs 20.1 and the presence of a UTI/pyelonephritis. No evidence of end organ damage, normal lactic acid. Foley and resuscitation with IV fluids, started on antibiotics. Sepsis physiology resolved.  Acute pyelonephritis secondary to Klebsiella pneumoniae Urinalysis consistent with UTI, bilateral flank pain, CT scan showed left more than right evidence of early pyelonephritis. On Rocephin, will continue IV for 1 more day, likely to switch to Ceftin Adjust antibiotics according to culture results, symptomatic management with antipyretics, antiemetics and analgesics.  Acute kidney injury Creatinine slightly elevated at 1.25, started on IV fluids, check BMP in a.m. This is likely secondary to the sepsis syndrome.  DM2 (diabetes mellitus, type 2) ([place]): -While inpatient will hold oral hypoglycemic agents -Will provide sliding scale insulin and will check hemoglobin A1c  HLD (hyperlipidemia): -Will continue statins and will check lipid panel in a.m.  OBESITY: -Low-calorie diet and exercise has been discussed with the patient -Body mass index is 41.86 kg/(m^2).  TOBACCO ABUSE: -Cessation counseling has been provided -Nicotine patch has been ordered  GERD: -Will continue PPI  HTN  (hypertension): -Currently stable -Will continue Norvasc and lisinopril -Was given IV fluids, discontinue keep Lasix on hold.  Nausea with vomiting: -Secondary to pyelonephritis -Will provide as needed antiemetics and fluid resuscitation  Abnormal TSH Low TSH at 0.227, check total T3 and free T4.  Hypokalemia and hypomagnesemia Likely secondary to nausea and vomiting, repleted with oral supplements.  Scrotal swelling Scrotal ultrasound to evaluate.  DVT prophylaxis:  Code Status: Full Code Family Communication: Plan discussed with the patient Disposition Plan: Await culture results Diet: Diet Carb Modified Fluid consistency:: Thin; Room service appropriate?: Yes  Consultants:   None  Procedures:   None  Antimicrobials:   Rocephin  Objective: Filed Vitals:   01/24/16 1500 01/24/16 2117 01/25/16 0629 01/25/16 1147  BP: 124/66 145/77 110/68 111/59  Pulse: 96 91 90   Temp: 98 F (36.7 C) 98.6 F (37 C) 98.2 F (36.8 C)   TempSrc: Axillary Oral Oral   Resp: 20 20 20    Height:      Weight:      SpO2: 95% 98% 98%     Intake/Output Summary (Last 24 hours) at 01/25/16 1204 Last data filed at 01/25/16 0920  Gross per 24 hour  Intake    770 ml  Output    900 ml  Net   -130 ml   Filed Weights   01/23/16 1507 01/23/16 1742  Weight: 136.079 kg (300 lb) 136.079 kg (300 lb)    Examination: General exam: Appears calm and comfortable  Respiratory system: Clear to auscultation. Respiratory effort normal. Cardiovascular system: S1 & S2 heard, RRR. No JVD, murmurs, rubs, gallops or clicks. No pedal edema. Gastrointestinal system: Abdomen is nondistended, soft and nontender. No organomegaly or masses felt. Normal bowel sounds heard. Central nervous  system: Alert and oriented. No focal neurological deficits. Extremities: Symmetric 5 x 5 power. Skin: No rashes, lesions or ulcers Psychiatry: Judgement and insight appear normal. Mood & affect appropriate.   Data  Reviewed: I have personally reviewed following labs and imaging studies  CBC:  Recent Labs Lab 01/23/16 1324 01/24/16 0525 01/25/16 0349  WBC 20.1* 20.4* 20.9*  NEUTROABS 14.9*  --   --   HGB 14.2 12.5* 11.7*  HCT 44.2 39.3 37.1*  MCV 90.0 89.1 90.3  PLT 302 280 [id number]   Basic Metabolic Panel:  Recent Labs Lab 01/23/16 1324 01/23/16 1806 01/24/16 0525 01/25/16 0349  NA 142  --  144 141  K 3.3*  --  3.4* 3.9  CL 102  --  106 105  CO2 29  --  26 26  GLUCOSE 127*  --  125* 157*  BUN 10  --  9 14  CREATININE 1.19  --  1.18 1.25*  CALCIUM 8.9  --  8.1* 8.1*  MG  --  1.5*  --  2.1  PHOS  --  2.7  --   --    GFR: Estimated Creatinine Clearance: 95.2 mL/min (by C-G formula based on Cr of 1.25). Liver Function Tests:  Recent Labs Lab 01/23/16 1324 01/23/16 1806  AST 15 13*  ALT 14* 12*  ALKPHOS 77 66  BILITOT 1.3* 1.5*  PROT 8.7* 7.8  ALBUMIN 3.7 3.3*    Recent Labs Lab 01/23/16 1325  LIPASE 25   No results for input(s): AMMONIA in the last 168 hours. Coagulation Profile: No results for input(s): INR, PROTIME in the last 168 hours. Cardiac Enzymes: No results for input(s): CKTOTAL, CKMB, CKMBINDEX, TROPONINI in the last 168 hours. BNP (last 3 results) No results for input(s): PROBNP in the last 8760 hours. HbA1C:  Recent Labs  01/23/16 1806  HGBA1C 7.0*   CBG:  Recent Labs Lab 01/24/16 0747 01/24/16 1210 01/24/16 1641 01/24/16 2112 01/25/16 0751  GLUCAP 108* 118* 122* 113* 146*   Lipid Profile:  Recent Labs  01/24/16 0517  CHOL 89  HDL 29*  LDLCALC 49  TRIG 57  CHOLHDL 3.1   Thyroid Function Tests:  Recent Labs  01/23/16 1806  TSH 0.227*   Anemia Panel: No results for input(s): VITAMINB12, FOLATE, FERRITIN, TIBC, IRON, RETICCTPCT in the last 72 hours. Urine analysis:    Component Value Date/Time   COLORURINE ORANGE* 01/23/2016 1331   APPEARANCEUR CLOUDY* 01/23/2016 1331   LABSPEC 1.014 01/23/2016 1331   PHURINE 6.0  01/23/2016 1331   GLUCOSEU NEGATIVE 01/23/2016 1331   HGBUR LARGE* 01/23/2016 1331   HGBUR small 06/06/2010 1519   BILIRUBINUR NEGATIVE 01/23/2016 1331   KETONESUR NEGATIVE 01/23/2016 1331   PROTEINUR 100* 01/23/2016 1331   UROBILINOGEN 1.0 07/27/2012 1708   NITRITE POSITIVE* 01/23/2016 1331   LEUKOCYTESUR MODERATE* 01/23/2016 1331   Sepsis Labs: @LABRCNTIP (procalcitonin:4,lacticidven:4)  ) Recent Results (from the past 240 hour(s))  Urine C&S     Status: Abnormal   Collection Time: 01/23/16  1:30 PM  Result Value Ref Range Status   Specimen Description URINE, CLEAN CATCH  Final   Special Requests NONE  Final   Culture >=100,000 COLONIES/mL KLEBSIELLA PNEUMONIAE (A)  Final   Report Status 01/25/2016 FINAL  Final   Organism ID, Bacteria KLEBSIELLA PNEUMONIAE (A)  Final      Susceptibility   Klebsiella pneumoniae - MIC*    AMPICILLIN >=32 RESISTANT Resistant     CEFAZOLIN >=64 RESISTANT Resistant  CEFTRIAXONE <=1 SENSITIVE Sensitive     CIPROFLOXACIN <=0.25 SENSITIVE Sensitive     GENTAMICIN <=1 SENSITIVE Sensitive     IMIPENEM <=0.25 SENSITIVE Sensitive     NITROFURANTOIN 32 SENSITIVE Sensitive     TRIMETH/SULFA <=20 SENSITIVE Sensitive     AMPICILLIN/SULBACTAM >=32 RESISTANT Resistant     PIP/TAZO >=128 RESISTANT Resistant     * >=100,000 COLONIES/mL KLEBSIELLA PNEUMONIAE  MRSA PCR Screening     Status: None   Collection Time: 01/23/16  5:53 PM  Result Value Ref Range Status   MRSA by PCR NEGATIVE NEGATIVE Final    Comment:        The GeneXpert MRSA Assay (FDA approved for NASAL specimens only), is one component of a comprehensive MRSA colonization surveillance program. It is not intended to diagnose MRSA infection nor to guide or monitor treatment for MRSA infections.   Culture, blood (Routine X 2) w Reflex to ID Panel     Status: None (Preliminary result)   Collection Time: 01/23/16  6:06 PM  Result Value Ref Range Status   Specimen Description BLOOD LEFT  HAND  Final   Special Requests BOTTLES DRAWN AEROBIC ONLY 5CC  Final   Culture   Final    NO GROWTH < 24 HOURS Performed at [facility]    Report Status PENDING  Incomplete  Culture, blood (Routine X 2) w Reflex to ID Panel     Status: None (Preliminary result)   Collection Time: 01/23/16  6:06 PM  Result Value Ref Range Status   Specimen Description BLOOD LEFT ANTECUBITAL  Final   Special Requests BOTTLES DRAWN AEROBIC AND ANAEROBIC 5CC  Final   Culture   Final    NO GROWTH < 24 HOURS Performed at [facility]    Report Status PENDING  Incomplete     Invalid input(s): PROCALCITONIN, LACTICACIDVEN   Radiology Studies: Ct Abdomen Pelvis W Contrast  01/23/2016  CLINICAL DATA:  Right lower quadrant pain. Bilateral flank tenderness. EXAM: CT ABDOMEN AND PELVIS WITH CONTRAST TECHNIQUE: Multidetector CT imaging of the abdomen and pelvis was performed using the standard protocol following bolus administration of intravenous contrast. CONTRAST:  155mL ISOVUE-300 IOPAMIDOL (ISOVUE-300) INJECTION 61% COMPARISON:  04/03/2012 FINDINGS: Lower chest and abdominal wall:  Wide necked fatty umbilical hernia. Hepatobiliary: No focal liver abnormality.No evidence of biliary obstruction or stone. Pancreas: Unremarkable. Spleen: Unremarkable. Adrenals/Urinary Tract: 13 mm left adrenal nodule, chronic based on 2013 CT and consistent with adenoma Thick walled bladder with perivesicular fat stranding. 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 Stranding tracks along the left more than right ureters. There is asymmetric left perinephric edema without striated nephrogram. No hydronephrosis. Reproductive:No pathologic findings. Stomach/Bowel:  No obstruction. No appendicitis. Vascular/Lymphatic: IVC filter in good position. No acute vascular abnormality. No mass or adenopathy. Peritoneal: No ascites or pneumoperitoneum. Musculoskeletal: Remote pelvic fractures with bilateral sacroiliac  and symphysis pubis fusion. Remote left femoral neck fracture status post fixation. 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 Cystitis with left ureteritis and probable early pyelonephritis. No hydronephrosis. 2. 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 Electronically Signed   By: Monte Fantasia M.D.   On: 01/23/2016 15:00        Scheduled Meds: . amLODipine  10 mg Oral QHS  . aspirin EC  81 mg Oral QHS  . cefTRIAXone (ROCEPHIN)  IV  1 g Intravenous Q24H  .  cholecalciferol  1,000 Units Oral QHS  . folic acid  1 mg Oral Daily  . gabapentin  600 mg Oral QHS  . heparin  5,000 Units Subcutaneous Q8H  . insulin aspart  0-5 Units Subcutaneous QHS  . insulin aspart  0-9 Units Subcutaneous TID WC  . lisinopril  20 mg Oral Daily  . loratadine  10 mg Oral Daily  . nicotine  14 mg Transdermal Daily  . pantoprazole  40 mg Oral QHS  . pravastatin  20 mg Oral QHS  . thiamine  100 mg Oral Daily   Continuous Infusions: . sodium chloride       LOS: 1 day    Time spent: 35 minutes    Kelina Beauchamp A, MD [facility] Pager [phone]  If 7PM-7AM, please contact night-coverage [email or web address] Password [facility] 01/25/2016, 12:04 PM

## 2016-01-26 LAB — CBC
HCT: 35.5 % — ABNORMAL LOW (ref 39.0–52.0)
Hemoglobin: 11.2 g/dL — ABNORMAL LOW (ref 13.0–17.0)
MCH: 28.5 pg (ref 26.0–34.0)
MCHC: 31.5 g/dL (ref 30.0–36.0)
MCV: 90.3 fL (ref 78.0–100.0)
PLATELETS: 293 10*3/uL (ref 150–400)
RBC: 3.93 MIL/uL — ABNORMAL LOW (ref 4.22–5.81)
RDW: 15.3 % (ref 11.5–15.5)
WBC: 15.4 10*3/uL — ABNORMAL HIGH (ref 4.0–10.5)

## 2016-01-26 LAB — GLUCOSE, CAPILLARY
GLUCOSE-CAPILLARY: 111 mg/dL — AB (ref 65–99)
Glucose-Capillary: 149 mg/dL — ABNORMAL HIGH (ref 65–99)

## 2016-01-26 LAB — BASIC METABOLIC PANEL
Anion gap: 9 (ref 5–15)
BUN: 11 mg/dL (ref 6–20)
CALCIUM: 8.1 mg/dL — AB (ref 8.9–10.3)
CO2: 26 mmol/L (ref 22–32)
CREATININE: 1 mg/dL (ref 0.61–1.24)
Chloride: 109 mmol/L (ref 101–111)
GFR calc Af Amer: >60 mL/min (ref >60)
GFR calc non Af Amer: >60 mL/min (ref >60)
GLUCOSE: 114 mg/dL — AB (ref 65–99)
Potassium: 3.8 mmol/L (ref 3.5–5.1)
Sodium: 144 mmol/L (ref 135–145)

## 2016-01-26 MED ORDER — CEFUROXIME AXETIL 500 MG PO TABS: 500.0000 mg | ORAL_TABLET | Freq: Two times a day (BID) | ORAL | Status: DC

## 2016-01-26 NOTE — Discharge Summary (Signed)
Physician Discharge Summary  Miguel Brady [id number] DOB: [birth year] DOA: 01/23/2016  PCP: Benito Mccreedy, MD  Admit date: 01/23/2016 Discharge date: 01/26/2016  Time spent: 40 minutes  Recommendations for Outpatient Follow-up:  1. Follow-up with primary care physician within one week.   Discharge Diagnoses:  Principal Problem:   Sepsis ([place]) Active Problems:   DM2 (diabetes mellitus, type 2) ([place])   HLD (hyperlipidemia)   OBESITY   TOBACCO ABUSE   GERD   HTN (hypertension)   Pyelonephritis   Nausea with vomiting   Discharge Condition: Stable  Diet recommendation: Heart healthy  Filed Weights   01/23/16 1507 01/23/16 1742  Weight: 136.079 kg (300 lb) 136.079 kg (300 lb)    History of present illness:  Miguel Brady is a 55 y.o. male with PMH significant for obesity, hypertension, diabetes, tobacco abuse, GERD, hyperlipidemia and traumatic brain injury; who presented to the emergency department secondary to one week of bilateral flank and right lower quadrant pain, with associated nausea, vomiting and chills. Patient also reports some darkening in the appearance of his urine. The pain is localized mainly in the right lower quadrant/suprapubic area, sharp/stabbing in quality, constant, 8 out of 10 in intensity, radiated to both flanks and associated with nausea and vomiting. Patient never took his temperature though has been feeling chills throughout this days. A lot of the vomiting has been Just food content and no blood. Patient denies chest pain, shortness of breath, hematuria, hematochezia, melena, cough, headaches, focal neurologic deficit or any other acute complaints. ED Course: Patient has a CT abdomen and pelvis with contrast that demonstrated a cystitis with ureteritis, no stones appreciated. Patient with Sirs due to pyelonephritis. Cultures were taken, IV fluids initiated and empirically started on Rocephin. [facility] has been called to admit the  patient for further evaluation and treatment.   Hospital Course:   Sepsis Presented with with temperature of 102.2 and WBCs 20.1 and the presence of a UTI/pyelonephritis. No evidence of end organ damage, normal lactic acid. [place] stated with IV fluids, was on IV antibiotics. Sepsis physiology resolved.  Acute pyelonephritis secondary to Klebsiella pneumoniae Urinalysis consistent with UTI, bilateral flank pain, CT scan showed left more than right evidence of early pyelonephritis. Was on Rocephin, symptomatic management with antipyretics, antiemetics and analgesics. Discharged on Ceftin for 11 more days to complete a total 14 days of antibiotics  Acute kidney injury Creatinine slightly elevated at 1.25, started on IV fluids, check BMP in a.m. This is likely secondary to the sepsis syndrome. Resolved with IV fluid hydration, creatinine 1.0 on discharge.  DM2 (diabetes mellitus, type 2) ([place]): -While inpatient will hold oral hypoglycemic agents, was on SSI. -Hemoglobin A1c is 7.0, consistent with controlled DM 2  HLD (hyperlipidemia): -Continue statins, total cholesterol 89, LDL 49, HDL is 29.  OBESITY: -Low-calorie diet and exercise has been discussed with the patient -Body mass index is 41.86 kg/(m^2).  TOBACCO ABUSE: -Cessation counseling has been provided -Nicotine patch has been ordered  GERD: -Will continue PPI  HTN (hypertension): -Currently stable -Will continue Norvasc and lisinopril -Was given IV fluids, discontinue keep Lasix on hold.  Nausea with vomiting: -Secondary to pyelonephritis -Will provide as needed antiemetics and fluid resuscitation  Abnormal TSH Low TSH at 0.227, recommend to recheck TSH, free T4 and total T3 in 4-6 weeks.  Hypokalemia and hypomagnesemia Likely secondary to nausea and vomiting, repleted with oral supplements.  Scrotal swelling Scrotal ultrasound showed no acute abnormalities. Has a small knot, not sure it's infection  or  ingrowing hair. Follow with PCP if no resolution with the current antibiotics.   Procedures:  None  Consultations:  None  Discharge Exam: Filed Vitals:   01/25/16 2111 01/26/16 0608  BP: 138/74 129/77  Pulse: 82 81  Temp: 98.4 F (36.9 C) 98 F (36.7 C)  Resp: 20 18  General: Alert and awake, oriented x3, not in any acute distress. HEENT: anicteric sclera, pupils reactive to light and accommodation, EOMI CVS: S1-S2 clear, no murmur rubs or gallops Chest: clear to auscultation bilaterally, no wheezing, rales or rhonchi Abdomen: soft nontender, nondistended, normal bowel sounds, no organomegaly Extremities: no cyanosis, clubbing or edema noted bilaterally Neuro: Cranial nerves II-XII intact, no focal neurological deficits  Discharge Instructions   Discharge Instructions    Diet - low sodium heart healthy    Complete by:  As directed      Increase activity slowly    Complete by:  As directed           Current Discharge Medication List    START taking these medications   Details  cefUROXime (CEFTIN) 500 MG tablet Take 1 tablet (500 mg total) by mouth 2 (two) times daily with a meal. Qty: 22 tablet, Refills: 0      CONTINUE these medications which have NOT CHANGED   Details  amLODipine (NORVASC) 10 MG tablet Take 10 mg by mouth at bedtime.     aspirin EC 81 MG tablet Take 81 mg by mouth at bedtime.     cetirizine (ZYRTEC) 10 MG tablet Take 10 mg by mouth at bedtime.  Refills: 3    chlorzoxazone (PARAFON) 500 MG tablet Take by mouth 4 (four) times daily as needed for muscle spasms.    cholecalciferol (VITAMIN D) 1000 UNITS tablet Take 1,000 Units by mouth at bedtime.     !! metFORMIN (GLUCOPHAGE) 500 MG tablet Take 500 mg by mouth daily as needed (hyperglycemia).     testosterone cypionate (DEPOTESTOSTERONE CYPIONATE) 200 MG/ML injection Inject 1 mL into the muscle every 28 (twenty-eight) days. Refills: 0    furosemide (LASIX) 40 MG tablet Take 40 mg by  mouth at bedtime. Refills: 0    GRALISE 600 MG TABS Take 600-1,200 mg by mouth at bedtime.  Refills: 4    lisinopril (PRINIVIL,ZESTRIL) 20 MG tablet Take 20 mg by mouth daily. Refills: 1    !! metFORMIN (GLUCOPHAGE) 1000 MG tablet Take 1,000 mg by mouth at bedtime. Refills: 1    oxyCODONE (ROXICODONE) 15 MG immediate release tablet Take 15 mg by mouth 3 (three) times daily as needed for pain.  Refills: 0    pantoprazole (PROTONIX) 40 MG tablet Take 40 mg by mouth at bedtime.     potassium chloride (K-DUR,KLOR-CON) 10 MEQ tablet Take 10 mEq by mouth at bedtime.  Refills: 0    pravastatin (PRAVACHOL) 20 MG tablet Take 20 mg by mouth at bedtime. Refills: 1     !! - Potential duplicate medications found. Please discuss with provider.     Allergies  Allergen Reactions  . Shellfish Allergy Anaphylaxis  . Penicillins Itching    Has patient had a PCN reaction causing immediate rash, facial/tongue/throat swelling, SOB or lightheadedness with hypotension: No Has patient had a PCN reaction causing severe rash involving mucus membranes or skin necrosis: No Has patient had a PCN reaction that required hospitalization No Has patient had a PCN reaction occurring within the last 10 years: No If all of the above answers are "NO", then may  proceed with Cephalosporin use.   Follow-up Information    Follow up with OSEI-BONSU,GEORGE, MD In 1 week.   Specialty:  Internal Medicine   Contact information:   [id number] [place] [address] [phone]        The results of significant diagnostics from this hospitalization (including imaging, microbiology, ancillary and laboratory) are listed below for reference.    Significant Diagnostic Studies: US Scrotum  01/25/2016  CLINICAL DATA:  Knot on the right testicle for 2 years EXAM: ULTRASOUND OF SCROTUM TECHNIQUE: Complete ultrasound examination of the testicles, epididymis, and other scrotal structures was performed.  COMPARISON:  None. FINDINGS: Right testicle Measurements: 12 x 17 x 14 mm. Heterogeneous abnormal echotexture of a small testicle, symmetric to the contralateral side. Testicles also appeared small on pelvis CT from 2014. Left testicle Measurements: 15 x 11 x 17 mm. Heterogeneous abnormal echotexture of the small testicle, symmetric to the contralateral side. Right epididymis:  No acute finding.  Simple appearing cysts. Left epididymis:  No acute finding.  Simple appearing cysts. Hydrocele:  None visualized. Varicocele:  None visualized. There is mobile fat within the inguinal canals, but no indication of hernia on pelvis CT from 2 days prior. This is presumably mobile spermatic cord. IMPRESSION: Symmetric testicular atrophy. Related heterogeneity of the parenchyma could obscure an early mass. If there is clinical asymmetry or growth surveillance sonography is recommended. Electronically Signed   By: Monte Fantasia M.D.   On: 01/25/2016 17:55   Ct Abdomen Pelvis W Contrast  01/23/2016  CLINICAL DATA:  Right lower quadrant pain. Bilateral flank tenderness. EXAM: CT ABDOMEN AND PELVIS WITH CONTRAST TECHNIQUE: Multidetector CT imaging of the abdomen and pelvis was performed using the standard protocol following bolus administration of intravenous contrast. CONTRAST:  146mL ISOVUE-300 IOPAMIDOL (ISOVUE-300) INJECTION 61% COMPARISON:  04/03/2012 FINDINGS: Lower chest and abdominal wall:  Wide necked fatty umbilical hernia. Hepatobiliary: No focal liver abnormality.No evidence of biliary obstruction or stone. Pancreas: Unremarkable. Spleen: Unremarkable. Adrenals/Urinary Tract: 13 mm left adrenal nodule, chronic based on 2013 CT and consistent with adenoma Thick walled bladder with perivesicular fat stranding. 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 Stranding tracks along the left more than right ureters. There is asymmetric left perinephric edema without striated nephrogram. No hydronephrosis.  Reproductive:No pathologic findings. Stomach/Bowel:  No obstruction. No appendicitis. Vascular/Lymphatic: IVC filter in good position. No acute vascular abnormality. No mass or adenopathy. Peritoneal: No ascites or pneumoperitoneum. Musculoskeletal: Remote pelvic fractures with bilateral sacroiliac and symphysis pubis fusion. Remote left femoral neck fracture status post fixation. 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 Cystitis with left ureteritis and probable early pyelonephritis. No hydronephrosis. 2. 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 Electronically Signed   By: Monte Fantasia M.D.   On: 01/23/2016 15:00    Microbiology: Recent Results (from the past 240 hour(s))  Urine C&S     Status: Abnormal   Collection Time: 01/23/16  1:30 PM  Result Value Ref Range Status   Specimen Description URINE, CLEAN CATCH  Final   Special Requests NONE  Final   Culture >=100,000 COLONIES/mL KLEBSIELLA PNEUMONIAE (A)  Final   Report Status 01/25/2016 FINAL  Final   Organism ID, Bacteria KLEBSIELLA PNEUMONIAE (A)  Final      Susceptibility   Klebsiella pneumoniae - MIC*    AMPICILLIN >=32 RESISTANT Resistant     CEFAZOLIN >=  64 RESISTANT Resistant     CEFTRIAXONE <=1 SENSITIVE Sensitive     CIPROFLOXACIN <=0.25 SENSITIVE Sensitive     GENTAMICIN <=1 SENSITIVE Sensitive     IMIPENEM <=0.25 SENSITIVE Sensitive     NITROFURANTOIN 32 SENSITIVE Sensitive     TRIMETH/SULFA <=20 SENSITIVE Sensitive     AMPICILLIN/SULBACTAM >=32 RESISTANT Resistant     PIP/TAZO >=128 RESISTANT Resistant     * >=100,000 COLONIES/mL KLEBSIELLA PNEUMONIAE  MRSA PCR Screening     Status: None   Collection Time: 01/23/16  5:53 PM  Result Value Ref Range Status   MRSA by PCR NEGATIVE NEGATIVE Final    Comment:        The GeneXpert MRSA Assay (FDA approved for NASAL specimens only), is  one component of a comprehensive MRSA colonization surveillance program. It is not intended to diagnose MRSA infection nor to guide or monitor treatment for MRSA infections.   Culture, blood (Routine X 2) w Reflex to ID Panel     Status: None (Preliminary result)   Collection Time: 01/23/16  6:06 PM  Result Value Ref Range Status   Specimen Description BLOOD LEFT HAND  Final   Special Requests BOTTLES DRAWN AEROBIC ONLY 5CC  Final   Culture   Final    NO GROWTH 2 DAYS Performed at [facility]    Report Status PENDING  Incomplete  Culture, blood (Routine X 2) w Reflex to ID Panel     Status: None (Preliminary result)   Collection Time: 01/23/16  6:06 PM  Result Value Ref Range Status   Specimen Description BLOOD LEFT ANTECUBITAL  Final   Special Requests BOTTLES DRAWN AEROBIC AND ANAEROBIC 5CC  Final   Culture   Final    NO GROWTH 2 DAYS Performed at [facility]    Report Status PENDING  Incomplete     Labs: Basic Metabolic Panel:  Recent Labs Lab 01/23/16 1324 01/23/16 1806 01/24/16 0525 01/25/16 0349 01/26/16 0349  NA 142  --  144 141 144  K 3.3*  --  3.4* 3.9 3.8  CL 102  --  106 105 109  CO2 29  --  26 26 26   GLUCOSE 127*  --  125* 157* 114*  BUN 10  --  9 14 11   CREATININE 1.19  --  1.18 1.25* 1.00  CALCIUM 8.9  --  8.1* 8.1* 8.1*  MG  --  1.5*  --  2.1  --   PHOS  --  2.7  --   --   --    Liver Function Tests:  Recent Labs Lab 01/23/16 1324 01/23/16 1806  AST 15 13*  ALT 14* 12*  ALKPHOS 77 66  BILITOT 1.3* 1.5*  PROT 8.7* 7.8  ALBUMIN 3.7 3.3*    Recent Labs Lab 01/23/16 1325  LIPASE 25   No results for input(s): AMMONIA in the last 168 hours. CBC:  Recent Labs Lab 01/23/16 1324 01/24/16 0525 01/25/16 0349 01/26/16 0349  WBC 20.1* 20.4* 20.9* 15.4*  NEUTROABS 14.9*  --   --   --   HGB 14.2 12.5* 11.7* 11.2*  HCT 44.2 39.3 37.1* 35.5*  MCV 90.0 89.1 90.3 90.3  PLT 302 280 273 293   Cardiac Enzymes: No results  for input(s): CKTOTAL, CKMB, CKMBINDEX, TROPONINI in the last 168 hours. BNP: BNP (last 3 results) No results for input(s): BNP in the last 8760 hours.  ProBNP (last 3 results) No results for input(s): PROBNP in the last  8760 hours.  CBG:  Recent Labs Lab 01/24/16 2112 01/25/16 0751 01/25/16 1221 01/25/16 1703 01/26/16 0809  GLUCAP 113* 146* 138* 120* 111*       Signed:  Verlee Monte A MD.  [facility] 01/26/2016, 9:20 AM

## 2016-01-26 NOTE — Progress Notes (Signed)
Pt has no follow HH needs. Plan to dc home with no needs at present time.

## 2016-01-27 ENCOUNTER — Inpatient Hospital Stay (HOSPITAL_COMMUNITY)
Admission: EM | Admit: 2016-01-27 | Discharge: 2016-01-30 | DRG: 690 | Disposition: A | Discharge status: Home / Self Care | Payer: Medicaid Other | Attending: Internal Medicine | Admitting: Internal Medicine

## 2016-01-27 ENCOUNTER — Encounter (HOSPITAL_COMMUNITY): Payer: Self-pay | Admitting: Emergency Medicine

## 2016-01-27 DIAGNOSIS — R1031 Right lower quadrant pain: Secondary | ICD-10-CM

## 2016-01-27 DIAGNOSIS — E669 Obesity, unspecified: Secondary | ICD-10-CM | POA: Diagnosis present

## 2016-01-27 DIAGNOSIS — G8929 Other chronic pain: Secondary | ICD-10-CM | POA: Diagnosis present

## 2016-01-27 DIAGNOSIS — Z87820 Personal history of traumatic brain injury: Secondary | ICD-10-CM

## 2016-01-27 DIAGNOSIS — N179 Acute kidney failure, unspecified: Secondary | ICD-10-CM | POA: Diagnosis not present

## 2016-01-27 DIAGNOSIS — I10 Essential (primary) hypertension: Secondary | ICD-10-CM

## 2016-01-27 DIAGNOSIS — Z1624 Resistance to multiple antibiotics: Secondary | ICD-10-CM | POA: Diagnosis present

## 2016-01-27 DIAGNOSIS — Z6838 Body mass index (BMI) 38.0-38.9, adult: Secondary | ICD-10-CM

## 2016-01-27 DIAGNOSIS — B961 Klebsiella pneumoniae [K. pneumoniae] as the cause of diseases classified elsewhere: Secondary | ICD-10-CM | POA: Diagnosis present

## 2016-01-27 DIAGNOSIS — K219 Gastro-esophageal reflux disease without esophagitis: Secondary | ICD-10-CM | POA: Diagnosis present

## 2016-01-27 DIAGNOSIS — Z880 Allergy status to penicillin: Secondary | ICD-10-CM

## 2016-01-27 DIAGNOSIS — Z7984 Long term (current) use of oral hypoglycemic drugs: Secondary | ICD-10-CM

## 2016-01-27 DIAGNOSIS — Z79899 Other long term (current) drug therapy: Secondary | ICD-10-CM

## 2016-01-27 DIAGNOSIS — E1142 Type 2 diabetes mellitus with diabetic polyneuropathy: Secondary | ICD-10-CM | POA: Diagnosis present

## 2016-01-27 DIAGNOSIS — Z9049 Acquired absence of other specified parts of digestive tract: Secondary | ICD-10-CM

## 2016-01-27 DIAGNOSIS — N12 Tubulo-interstitial nephritis, not specified as acute or chronic: Principal | ICD-10-CM | POA: Diagnosis present

## 2016-01-27 DIAGNOSIS — E119 Type 2 diabetes mellitus without complications: Secondary | ICD-10-CM | POA: Diagnosis not present

## 2016-01-27 DIAGNOSIS — Z7982 Long term (current) use of aspirin: Secondary | ICD-10-CM

## 2016-01-27 DIAGNOSIS — N390 Urinary tract infection, site not specified: Secondary | ICD-10-CM | POA: Diagnosis present

## 2016-01-27 DIAGNOSIS — E785 Hyperlipidemia, unspecified: Secondary | ICD-10-CM | POA: Diagnosis present

## 2016-01-27 DIAGNOSIS — R109 Unspecified abdominal pain: Secondary | ICD-10-CM | POA: Diagnosis present

## 2016-01-27 DIAGNOSIS — R112 Nausea with vomiting, unspecified: Secondary | ICD-10-CM | POA: Diagnosis not present

## 2016-01-27 DIAGNOSIS — Z91013 Allergy to seafood: Secondary | ICD-10-CM

## 2016-01-27 DIAGNOSIS — F17210 Nicotine dependence, cigarettes, uncomplicated: Secondary | ICD-10-CM | POA: Diagnosis present

## 2016-01-27 DIAGNOSIS — R1110 Vomiting, unspecified: Secondary | ICD-10-CM | POA: Diagnosis present

## 2016-01-27 LAB — CBC WITH DIFFERENTIAL/PLATELET
BASOS ABS: 0 10*3/uL (ref 0.0–0.1)
Basophils Relative: 0 %
Eosinophils Absolute: 0.1 10*3/uL (ref 0.0–0.7)
Eosinophils Relative: 1 %
HCT: 36.2 % — ABNORMAL LOW (ref 39.0–52.0)
Hemoglobin: 12.2 g/dL — ABNORMAL LOW (ref 13.0–17.0)
LYMPHS PCT: 18 %
Lymphs Abs: 2.7 10*3/uL (ref 0.7–4.0)
MCH: 29.5 pg (ref 26.0–34.0)
MCHC: 33.7 g/dL (ref 30.0–36.0)
MCV: 87.4 fL (ref 78.0–100.0)
MONO ABS: 1.7 10*3/uL — AB (ref 0.1–1.0)
Monocytes Relative: 11 %
Neutro Abs: 10.5 10*3/uL — ABNORMAL HIGH (ref 1.7–7.7)
Neutrophils Relative %: 70 %
PLATELETS: 317 10*3/uL (ref 150–400)
RBC: 4.14 MIL/uL — AB (ref 4.22–5.81)
RDW: 15 % (ref 11.5–15.5)
WBC: 15 10*3/uL — AB (ref 4.0–10.5)

## 2016-01-27 LAB — COMPREHENSIVE METABOLIC PANEL
ALK PHOS: 68 U/L (ref 38–126)
ALT: 14 U/L — AB (ref 17–63)
AST: 13 U/L — AB (ref 15–41)
Albumin: 3.2 g/dL — ABNORMAL LOW (ref 3.5–5.0)
Anion gap: 12 (ref 5–15)
BILIRUBIN TOTAL: 0.6 mg/dL (ref 0.3–1.2)
BUN: 10 mg/dL (ref 6–20)
CHLORIDE: 108 mmol/L (ref 101–111)
CO2: 27 mmol/L (ref 22–32)
CREATININE: 1.51 mg/dL — AB (ref 0.61–1.24)
Calcium: 8.8 mg/dL — ABNORMAL LOW (ref 8.9–10.3)
GFR calc non Af Amer: 51 mL/min — ABNORMAL LOW (ref >60)
GFR, EST AFRICAN AMERICAN: 59 mL/min — AB (ref >60)
Glucose, Bld: 123 mg/dL — ABNORMAL HIGH (ref 65–99)
Potassium: 3.5 mmol/L (ref 3.5–5.1)
Sodium: 147 mmol/L — ABNORMAL HIGH (ref 135–145)
TOTAL PROTEIN: 7.8 g/dL (ref 6.5–8.1)

## 2016-01-27 LAB — URINALYSIS, ROUTINE W REFLEX MICROSCOPIC
BILIRUBIN URINE: NEGATIVE
GLUCOSE, UA: NEGATIVE mg/dL
KETONES UR: NEGATIVE mg/dL
Leukocytes, UA: NEGATIVE
NITRITE: NEGATIVE
PH: 5 (ref 5.0–8.0)
PROTEIN: NEGATIVE mg/dL
Specific Gravity, Urine: 1.007 (ref 1.005–1.030)

## 2016-01-27 LAB — URINE MICROSCOPIC-ADD ON

## 2016-01-27 LAB — LIPASE, BLOOD: LIPASE: 22 U/L (ref 11–51)

## 2016-01-27 LAB — I-STAT CG4 LACTIC ACID, ED: LACTIC ACID, VENOUS: 0.66 mmol/L (ref 0.5–2.0)

## 2016-01-27 MED ORDER — SODIUM CHLORIDE 0.9 % IV BOLUS (SEPSIS)
1000.0000 mL | Freq: Once | INTRAVENOUS | Status: AC
Start: 2016-01-27 — End: 2016-01-27
  Administered 2016-01-27: 1000 mL via INTRAVENOUS

## 2016-01-27 MED ORDER — KETOROLAC TROMETHAMINE 15 MG/ML IJ SOLN
15.0000 mg | Freq: Once | INTRAMUSCULAR | Status: AC
  Administered 2016-01-27: 15 mg via INTRAVENOUS
  Filled 2016-01-27: qty 1

## 2016-01-27 MED ORDER — DEXTROSE 5 % IV SOLN
1.0000 g | Freq: Once | INTRAVENOUS | Status: AC
  Administered 2016-01-27: 1 g via INTRAVENOUS
  Filled 2016-01-27: qty 10

## 2016-01-27 MED ORDER — DEXTROSE 5 % IV SOLN
1.0000 g | INTRAVENOUS | Status: DC
  Administered 2016-01-28: 1 g via INTRAVENOUS
  Filled 2016-01-27: qty 10

## 2016-01-27 MED ORDER — ONDANSETRON HCL 4 MG/2ML IJ SOLN
4.0000 mg | Freq: Once | INTRAMUSCULAR | Status: AC
  Administered 2016-01-27: 4 mg via INTRAVENOUS
  Filled 2016-01-27: qty 2

## 2016-01-27 NOTE — ED Notes (Signed)
Brought in by EMS from home with c/o abdominal pain.  Per EMS, pt reported that he has been having progressive abdominal pain that radiates to right flank area.  Was recently diagnosed with pyelonephritis on the 9th---- has not completed his prescribed antibiotics yet; stated that he has "9 days left for the antibiotics".  Pt also reports "dark urine" with a "very strong smell".

## 2016-01-27 NOTE — H&P (Signed)
[facility] Admission History and Physical       Miguel Brady [id number] DOB: 1960/10/03 DOA: 01/27/2016  Referring physician:  EDP PCP: Benito Mccreedy, MD  Specialists:   Chief Complaint: Nausea and Vomiting and ABD Pain  HPI: Miguel Brady is a 55 y.o. male with a history of DM2, HTN who was diagnosed with Pyelonephritis on 05/09 and discharged home to continue on Ceftin Rx but returns to the ED with complaints of Nausea and Vomiting and ABd Pain and not being able to hold down his antibioitIcs.  He also reports having dark urine.    He was evaluated in the ED and referred for Observation after administration of IVFs, and Anti-Emetics.       Review of Systems:  Constitutional: No Weight Loss, No Weight Gain, Night Sweats, Fevers, Chills, Dizziness, Light Headedness, Fatigue, or Generalized Weakness HEENT: No Headaches, Difficulty Swallowing,Tooth/Dental Problems,Sore Throat,  No Sneezing, Rhinitis, Ear Ache, Nasal Congestion, or Post Nasal Drip,  Cardio-vascular:  No Chest pain, Orthopnea, PND, Edema in Lower Extremities, Anasarca, Dizziness, Palpitations  Resp: No Dyspnea, No DOE, No Productive Cough, No Non-Productive Cough, No Hemoptysis, No Wheezing.    GI: No Heartburn, Indigestion, +Abdominal Pain, +Nausea, +Vomiting, Diarrhea, Constipation, Hematemesis, Hematochezia, Melena, Change in Bowel Habits,  Loss of Appetite  GU: No Dysuria, No Change in Color of Urine, No Urgency or Urinary Frequency, No Flank pain.  Musculoskeletal: No Joint Pain or Swelling, No Decreased Range of Motion, No Back Pain.  Neurologic: No Syncope, No Seizures, Muscle Weakness, Paresthesia, Vision Disturbance or Loss, No Diplopia, No Vertigo, No Difficulty Walking,  Skin: No Rash or Lesions. Psych: No Change in Mood or Affect, No Depression or Anxiety, No Memory loss, No Confusion, or Hallucinations   Past Medical History  Diagnosis Date  . Obesity   . Hypertension   .  Diabetes mellitus   . Headache(784.0)   . TOBACCO ABUSE 12/23/2008  . GERD 05/05/2007  . TRIGGER FINGER 05/05/2007  . REACTIVE AIRWAY DISEASE 12/23/2008    pt denies.  no inhaler  . Hyperlipidemia   . Substance abuse     ETOH  . Tuberculosis     pos PPD  . 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 External fixation pelvis  04/03/2012    Procedure: EXTERNAL FIXATION PELVIS;  Surgeon: Rozanna Box, MD;  Location: [place];  Service: Orthopedics;;  . External fixation leg  04/03/2012    Procedure: EXTERNAL FIXATION LEG;  Surgeon: Rozanna Box, MD;  Location: [place];  Service: Orthopedics;  Laterality: Left;  Left femur  . Chest tube insertion  04/03/2012    Procedure: CHEST TUBE INSERTION;  Surgeon: Zenovia Jarred, MD;  Location: [place];  Service: General;  Laterality: Left;  . Incision and drainage of wound  04/03/2012    Procedure: IRRIGATION AND DEBRIDEMENT WOUND;  Surgeon: Otilio Connors, MD;  Location: [place];  Service: Neurosurgery;  Laterality: N/A;  Frontal.  . Tibia im nail insertion  04/07/2012    Procedure: INTRAMEDULLARY (IM) NAIL TIBIAL;  Surgeon: Rozanna Box, MD;  Location: [place];  Service: Orthopedics;  Laterality: Left;  . Femur im nail  04/07/2012    Procedure: INTRAMEDULLARY (IM) NAIL FEMORAL;  Surgeon: Astrid Divine  Marcelino Scot, MD;  Location: [place];  Service: Orthopedics;  Laterality: Left;  . Orif patella  04/07/2012    Procedure: OPEN REDUCTION INTERNAL (ORIF) FIXATION PATELLA;  Surgeon: Rozanna Box, MD;  Location: [place];  Service: Orthopedics;  Laterality: Left;  . Orif pelvic fracture  04/07/2012    Procedure: OPEN REDUCTION INTERNAL FIXATION (ORIF) PELVIC FRACTURE;  Surgeon: Rozanna Box, MD;  Location: [place];  Service: Orthopedics;  Laterality: N/A;  Right and left sacroiliac screw  pinning,Irrigation and debridebridement open tibia and femur,removal external fixator.  . Flexible bronchoscopy  04/07/2012    Procedure: FLEXIBLE BRONCHOSCOPY;  Surgeon: Zenovia Jarred, MD;  Location: [place];  Service: General;;  START TIME=1645 END TIME=1700  . Cholecystectomy  07/28/2012    Procedure: LAPAROSCOPIC CHOLECYSTECTOMY;  Surgeon: Harl Bowie, MD;  [facility];  Service: General;  Laterality: N/A;      Prior to Admission medications   Medication Sig Start Date End Date Taking? Authorizing Provider  amLODipine (NORVASC) 10 MG tablet Take 10 mg by mouth at bedtime.    Yes Historical Provider, MD  aspirin EC 81 MG tablet Take 81 mg by mouth at bedtime.    Yes Historical Provider, MD  cefUROXime (CEFTIN) 500 MG tablet Take 1 tablet (500 mg total) by mouth 2 (two) times daily with a meal. 01/26/16  Yes Verlee Monte, MD  chlorzoxazone (PARAFON) 500 MG tablet Take by mouth 4 (four) times daily as needed for muscle spasms.   Yes Historical Provider, MD  cholecalciferol (VITAMIN D) 1000 UNITS tablet Take 1,000 Units by mouth at bedtime.    Yes Historical Provider, MD  diphenhydrAMINE (SOMINEX) 25 MG tablet Take 25 mg by mouth daily as needed for allergies or sleep.   Yes Historical Provider, MD  furosemide (LASIX) 40 MG tablet Take 40 mg by mouth at bedtime. 08/21/15  Yes Historical Provider, MD  GLUCERNA (GLUCERNA) LIQD Take 237 mLs by mouth daily as needed (supplement a meal.).   Yes Historical Provider, MD  GRALISE 600 MG TABS Take 600-1,200 mg by mouth at bedtime.  07/07/15  Yes Historical Provider, MD  lisinopril (PRINIVIL,ZESTRIL) 20 MG tablet Take 20 mg by mouth daily. 09/21/15  Yes Historical Provider, MD  metFORMIN (GLUCOPHAGE) 1000 MG tablet Take 1,000 mg by mouth at bedtime. 07/07/15  Yes Historical Provider, MD  metFORMIN (GLUCOPHAGE) 500 MG tablet Take 500 mg by mouth daily as needed (hyperglycemia).    Yes Historical Provider, MD  oxyCODONE (ROXICODONE) 15 MG  immediate release tablet Take 15 mg by mouth 3 (three) times daily as needed for pain.  09/25/15  Yes Historical Provider, MD  pantoprazole (PROTONIX) 40 MG tablet Take 40 mg by mouth at bedtime.    Yes Historical Provider, MD  potassium chloride (K-DUR,KLOR-CON) 10 MEQ tablet Take 10 mEq by mouth at bedtime.  09/21/15  Yes Historical Provider, MD  pravastatin (PRAVACHOL) 20 MG tablet Take 20 mg by mouth at bedtime. 09/15/15  Yes Historical Provider, MD  testosterone cypionate (DEPOTESTOSTERONE CYPIONATE) 200 MG/ML injection Inject 1 mL into the muscle every 28 (twenty-eight) days. 09/21/15  Yes Historical Provider, MD     Allergies  Allergen Reactions  . Shellfish Allergy Anaphylaxis  . Penicillins Itching    Has patient had a PCN reaction causing immediate rash, facial/tongue/throat swelling, SOB or lightheadedness with hypotension: No Has patient had a PCN reaction causing severe rash involving mucus membranes or skin necrosis: No Has patient had a PCN reaction that required hospitalization  No Has patient had a PCN reaction occurring within the last 10 years: No If all of the above answers are "NO", then may proceed with Cephalosporin use.    Social History:  reports that he has been smoking Cigarettes.  He has a 15 pack-year smoking history. He has never used smokeless tobacco. He reports that he does not drink alcohol or use illicit drugs.    Family History  Problem Relation Age of Onset  . Cancer Father   . Colon cancer Father   . Esophageal cancer Neg Hx   . Rectal cancer Neg Hx   . Stomach cancer Neg Hx   . Colon cancer Paternal Uncle     pt thinks 8 pat uncles       Physical Exam:  GEN:  Pleasant Well Nourished and Well Developed  55 y.o.  African American male examined and in no acute distress; cooperative with exam Filed Vitals:   01/27/16 2200 01/27/16 2238 01/27/16 2300 01/27/16 2330  BP: 131/79 133/80 133/81 130/80  Pulse:  68 61   Temp:      TempSrc:      Resp:  18     SpO2:  94% 93%    Blood pressure 130/80, pulse 61, temperature 98.2 F (36.8 C), temperature source Oral, resp. rate 18, SpO2 93 %. PSYCH: He is alert and oriented x4; does not appear anxious does not appear depressed; affect is normal HEENT: Normocephalic and Atraumatic, Mucous membranes pink; PERRLA; EOM intact; Fundi:  Benign;  No scleral icterus, Nares: Patent, Oropharynx: Clear,    Neck:  FROM, No Cervical Lymphadenopathy nor Thyromegaly or Carotid Bruit; No JVD; Breasts:: Not examined CHEST WALL: No tenderness CHEST: Normal respiration, clear to auscultation bilaterally HEART: Regular rate and rhythm; no murmurs rubs or gallops BACK: No kyphosis or scoliosis; No CVA tenderness ABDOMEN: Positive Bowel Sounds, Soft Non-Tender, No Rebound or Guarding; No Masses, No Organomegaly Rectal Exam: Not done EXTREMITIES: No Cyanosis, Clubbing, or Edema; No Ulcerations. Genitalia: not examined PULSES: 2+ and symmetric SKIN: Normal hydration no rash or ulceration CNS:  Alert and Oriented x 4, No Focal Deficits Vascular: pulses palpable throughout    Labs on Admission:  Basic Metabolic Panel:  Recent Labs Lab 01/23/16 1324 01/23/16 1806 01/24/16 0525 01/25/16 0349 01/26/16 0349 01/27/16 2109  NA 142  --  144 141 144 147*  K 3.3*  --  3.4* 3.9 3.8 3.5  CL 102  --  106 105 109 108  CO2 29  --  26 26 26 27   GLUCOSE 127*  --  125* 157* 114* 123*  BUN 10  --  9 14 11 10   CREATININE 1.19  --  1.18 1.25* 1.00 1.51*  CALCIUM 8.9  --  8.1* 8.1* 8.1* 8.8*  MG  --  1.5*  --  2.1  --   --   PHOS  --  2.7  --   --   --   --    Liver Function Tests:  Recent Labs Lab 01/23/16 1324 01/23/16 1806 01/27/16 2109  AST 15 13* 13*  ALT 14* 12* 14*  ALKPHOS 77 66 68  BILITOT 1.3* 1.5* 0.6  PROT 8.7* 7.8 7.8  ALBUMIN 3.7 3.3* 3.2*    Recent Labs Lab 01/23/16 1325 01/27/16 2109  LIPASE 25 22   No results for input(s): AMMONIA in the last 168 hours. CBC:  Recent Labs Lab  01/23/16 1324 01/24/16 0525 01/25/16 0349 01/26/16 0349 01/27/16 2109  WBC 20.1* 20.4*  20.9* 15.4* 15.0*  NEUTROABS 14.9*  --   --   --  10.5*  HGB 14.2 12.5* 11.7* 11.2* 12.2*  HCT 44.2 39.3 37.1* 35.5* 36.2*  MCV 90.0 89.1 90.3 90.3 87.4  PLT 302 280 273 293 317   Cardiac Enzymes: No results for input(s): CKTOTAL, CKMB, CKMBINDEX, TROPONINI in the last 168 hours.  BNP (last 3 results) No results for input(s): BNP in the last 8760 hours.  ProBNP (last 3 results) No results for input(s): PROBNP in the last 8760 hours.  CBG:  Recent Labs Lab 01/25/16 0751 01/25/16 1221 01/25/16 1703 01/25/16 2106 01/26/16 0809  GLUCAP 146* 138* 120* 149* 111*    Radiological Exams on Admission: No results found.   EKG: Independently reviewed.        Assessment/Plan:      55 y.o. male with  Principal Problem:    Abdominal pain   PRN IV Dilaudid   Monitor   Active Problems:    UTI (lower urinary tract infection)   IV Rocephin    AKI (acute kidney injury) (HCC)   IVFs   Hold Lasix, Lisinopril, and Metformin RX   Monitor BUN/Cr    Vomiting   Anti-Emetics PRN        DM2 (diabetes mellitus, type 2) (HCC)   Hold Metformin Rx   SSI coverage PRN      HTN (hypertension)   Continue Amlodipine   Monitor BPs     DVT Prophylaxis    Lovenox     Code Status:   FULL CODE      Family Communication:   Family Present    Disposition Plan:  Observation Status        Time spent: 81 Minutes      Theressa Millard [facility] Pager [phone]   If [place] Please Contact the Day Rounding Team MD for [facility]  If 7PM-7AM, Please Contact Night-Floor Coverage  [email or web address] Password [email or web address] 01/27/2016, 11:48 PM     ADDENDUM:   Patient was seen and examined on 01/27/2016

## 2016-01-27 NOTE — ED Provider Notes (Signed)
CSN: [id number]     Arrival date & time 01/27/16  2013 History   First MD Initiated Contact with Patient 01/27/16 2038     Chief Complaint  Patient presents with  . Abdominal Pain    Patient is a 55 y.o. male presenting with abdominal pain. The history is provided by the patient. No language interpreter was used.  Abdominal Pain  Miguel Brady is a 55 y.o. male who presents to the Emergency Department complaining of abdominal pain.  He reports 1 week of severe right flank and right lower quadrant pain. He has associated nausea and vomiting. Vomiting has been present for the last 2 days. He was discharged from the hospital yesterday for a urinary tract infection. He vomited his medications today. He denies any fevers. Symptoms are severe, constant, worsening.  Past Medical History  Diagnosis Date  . Obesity   . Hypertension   . Diabetes mellitus   . Headache(784.0)   . TOBACCO ABUSE 12/23/2008  . GERD 05/05/2007  . TRIGGER FINGER 05/05/2007  . REACTIVE AIRWAY DISEASE 12/23/2008    pt denies.  no inhaler  . Hyperlipidemia   . Substance abuse     ETOH  . Tuberculosis     pos PPD  . 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 Neuropathy due to medical condition [facility])     bilateral feet   Past Surgical History  Procedure Laterality Date  . External fixation pelvis  04/03/2012    Procedure: EXTERNAL FIXATION PELVIS;  Surgeon: Rozanna Box, MD;  Location: [place];  Service: Orthopedics;;  . External fixation leg  04/03/2012    Procedure: EXTERNAL FIXATION LEG;  Surgeon: Rozanna Box, MD;  Location: [place];  Service: Orthopedics;  Laterality: Left;  Left femur  . Chest tube insertion  04/03/2012    Procedure: CHEST TUBE INSERTION;  Surgeon: Zenovia Jarred, MD;  Location: [place];  Service: General;  Laterality: Left;  . Incision and drainage of wound  04/03/2012    Procedure: IRRIGATION AND DEBRIDEMENT WOUND;   Surgeon: Otilio Connors, MD;  Location: [place];  Service: Neurosurgery;  Laterality: N/A;  Frontal.  . Tibia im nail insertion  04/07/2012    Procedure: INTRAMEDULLARY (IM) NAIL TIBIAL;  Surgeon: Rozanna Box, MD;  Location: [place];  Service: Orthopedics;  Laterality: Left;  . Femur im nail  04/07/2012    Procedure: INTRAMEDULLARY (IM) NAIL FEMORAL;  Surgeon: Rozanna Box, MD;  Location: [place];  Service: Orthopedics;  Laterality: Left;  . Orif patella  04/07/2012    Procedure: OPEN REDUCTION INTERNAL (ORIF) FIXATION PATELLA;  Surgeon: Rozanna Box, MD;  Location: [place];  Service: Orthopedics;  Laterality: Left;  . Orif pelvic fracture  04/07/2012    Procedure: OPEN REDUCTION INTERNAL FIXATION (ORIF) PELVIC FRACTURE;  Surgeon: Rozanna Box, MD;  Location: [place];  Service: Orthopedics;  Laterality: N/A;  Right and left sacroiliac screw pinning,Irrigation and debridebridement open tibia and femur,removal external fixator.  . Flexible bronchoscopy  04/07/2012    Procedure: FLEXIBLE BRONCHOSCOPY;  Surgeon: Zenovia Jarred, MD;  Location: [place];  Service: General;;  START TIME=1645 END TIME=1700  . Cholecystectomy  07/28/2012    Procedure: LAPAROSCOPIC CHOLECYSTECTOMY;  Surgeon: Harl Bowie, MD;  [facility];  Service: General;  Laterality: N/A;   Family History  Problem Relation Age of Onset  .  Cancer Father   . Colon cancer Father   . Esophageal cancer Neg Hx   . Rectal cancer Neg Hx   . Stomach cancer Neg Hx   . Colon cancer Paternal Uncle     pt thinks 8 pat uncles   Social History  Substance Use Topics  . Smoking status: Current Every Day Smoker -- 0.50 packs/day for 30 years    Types: Cigarettes    Last Attempt to Quit: 04/02/2012  . Smokeless tobacco: Never Used  . Alcohol Use: No     Comment: Pt denies alcohol consumption for 2 years    Review of Systems  Gastrointestinal: Positive for abdominal pain.  All other systems reviewed and are  negative.     Allergies  Shellfish allergy and Penicillins  Home Medications   Prior to Admission medications   Medication Sig Start Date End Date Taking? Authorizing Provider  amLODipine (NORVASC) 10 MG tablet Take 10 mg by mouth at bedtime.    Yes Historical Provider, MD  aspirin EC 81 MG tablet Take 81 mg by mouth at bedtime.    Yes Historical Provider, MD  cefUROXime (CEFTIN) 500 MG tablet Take 1 tablet (500 mg total) by mouth 2 (two) times daily with a meal. 01/26/16  Yes Verlee Monte, MD  chlorzoxazone (PARAFON) 500 MG tablet Take by mouth 4 (four) times daily as needed for muscle spasms.   Yes Historical Provider, MD  cholecalciferol (VITAMIN D) 1000 UNITS tablet Take 1,000 Units by mouth at bedtime.    Yes Historical Provider, MD  diphenhydrAMINE (SOMINEX) 25 MG tablet Take 25 mg by mouth daily as needed for allergies or sleep.   Yes Historical Provider, MD  furosemide (LASIX) 40 MG tablet Take 40 mg by mouth at bedtime. 08/21/15  Yes Historical Provider, MD  GLUCERNA (GLUCERNA) LIQD Take 237 mLs by mouth daily as needed (supplement a meal.).   Yes Historical Provider, MD  GRALISE 600 MG TABS Take 600-1,200 mg by mouth at bedtime.  07/07/15  Yes Historical Provider, MD  lisinopril (PRINIVIL,ZESTRIL) 20 MG tablet Take 20 mg by mouth daily. 09/21/15  Yes Historical Provider, MD  metFORMIN (GLUCOPHAGE) 1000 MG tablet Take 1,000 mg by mouth at bedtime. 07/07/15  Yes Historical Provider, MD  metFORMIN (GLUCOPHAGE) 500 MG tablet Take 500 mg by mouth daily as needed (hyperglycemia).    Yes Historical Provider, MD  oxyCODONE (ROXICODONE) 15 MG immediate release tablet Take 15 mg by mouth 3 (three) times daily as needed for pain.  09/25/15  Yes Historical Provider, MD  pantoprazole (PROTONIX) 40 MG tablet Take 40 mg by mouth at bedtime.    Yes Historical Provider, MD  potassium chloride (K-DUR,KLOR-CON) 10 MEQ tablet Take 10 mEq by mouth at bedtime.  09/21/15  Yes Historical Provider, MD   pravastatin (PRAVACHOL) 20 MG tablet Take 20 mg by mouth at bedtime. 09/15/15  Yes Historical Provider, MD  testosterone cypionate (DEPOTESTOSTERONE CYPIONATE) 200 MG/ML injection Inject 1 mL into the muscle every 28 (twenty-eight) days. 09/21/15  Yes Historical Provider, MD   BP 117/75 mmHg  Pulse 62  Temp(Src) 98.8 F (37.1 C) (Oral)  Resp 16  Ht 6\' 1"  (1.854 m)  Wt 290 lb 2 oz (131.6 kg)  BMI 38.29 kg/m2  SpO2 97% Physical Exam  Constitutional: He is oriented to person, place, and time. He appears well-developed and well-nourished. He appears distressed.  Uncomfortable appearing  HENT:  Head: Normocephalic and atraumatic.  Cardiovascular: Normal rate and regular rhythm.   No  murmur heard. Pulmonary/Chest: Effort normal and breath sounds normal. No respiratory distress.  Abdominal: Soft. There is no rebound and no guarding.  Moderate right CVA tenderness with mild right lower quadrant tenderness. There is ecchymosis over the lower abdominal wall at sites of prior injections.  Musculoskeletal: He exhibits no edema or tenderness.  Neurological: He is alert and oriented to person, place, and time.  Skin: Skin is warm and dry.  Psychiatric: He has a normal mood and affect. His behavior is normal.  Nursing note and vitals reviewed.   ED Course  Procedures (including critical care time) Labs Review Labs Reviewed  COMPREHENSIVE METABOLIC PANEL - Abnormal; Notable for the following:    Sodium 147 (*)    Glucose, Bld 123 (*)    Creatinine, Ser 1.51 (*)    Calcium 8.8 (*)    Albumin 3.2 (*)    AST 13 (*)    ALT 14 (*)    GFR calc non Af Amer 51 (*)    GFR calc Af Amer 59 (*)    All other components within normal limits  CBC WITH DIFFERENTIAL/PLATELET - Abnormal; Notable for the following:    WBC 15.0 (*)    RBC 4.14 (*)    Hemoglobin 12.2 (*)    HCT 36.2 (*)    Neutro Abs 10.5 (*)    Monocytes Absolute 1.7 (*)    All other components within normal limits  URINALYSIS,  ROUTINE W REFLEX MICROSCOPIC (NOT AT [facility]) - Abnormal; Notable for the following:    APPearance CLOUDY (*)    Hgb urine dipstick MODERATE (*)    All other components within normal limits  URINE MICROSCOPIC-ADD ON - Abnormal; Notable for the following:    Squamous Epithelial / LPF 0-5 (*)    Bacteria, UA RARE (*)    All other components within normal limits  GLUCOSE, CAPILLARY - Abnormal; Notable for the following:    Glucose-Capillary 155 (*)    All other components within normal limits  URINE CULTURE  LIPASE, BLOOD  BASIC METABOLIC PANEL  CBC  I-STAT CG4 LACTIC ACID, ED    Imaging Review No results found. I have personally reviewed and evaluated these images and lab results as part of my medical decision-making.   EKG Interpretation None      MDM   Final diagnoses:  None    Patient here following recent hospitalization for pyelonephritis with ongoing abdominal pain, vomiting, inability to tolerate oral medicines. He is partially improved after treatment in the emergency department but still uncomfortable going home with oral medications. Plan to admit for observation for ongoing fluids, nausea control.    Quintella Reichert, MD 01/28/16 0120

## 2016-01-27 NOTE — ED Notes (Signed)
Bed: [id number] Expected date:  Expected time:  Means of arrival:  Comments: 89 M right flank pain

## 2016-01-27 NOTE — ED Notes (Signed)
Pt was given sprite for po challenge.

## 2016-01-28 ENCOUNTER — Observation Stay (HOSPITAL_COMMUNITY): Payer: Medicaid Other

## 2016-01-28 DIAGNOSIS — K219 Gastro-esophageal reflux disease without esophagitis: Secondary | ICD-10-CM | POA: Diagnosis present

## 2016-01-28 DIAGNOSIS — E669 Obesity, unspecified: Secondary | ICD-10-CM | POA: Diagnosis present

## 2016-01-28 DIAGNOSIS — N390 Urinary tract infection, site not specified: Secondary | ICD-10-CM | POA: Diagnosis not present

## 2016-01-28 DIAGNOSIS — Z6838 Body mass index (BMI) 38.0-38.9, adult: Secondary | ICD-10-CM | POA: Diagnosis not present

## 2016-01-28 DIAGNOSIS — E785 Hyperlipidemia, unspecified: Secondary | ICD-10-CM | POA: Diagnosis present

## 2016-01-28 DIAGNOSIS — F17210 Nicotine dependence, cigarettes, uncomplicated: Secondary | ICD-10-CM | POA: Diagnosis present

## 2016-01-28 DIAGNOSIS — G8929 Other chronic pain: Secondary | ICD-10-CM | POA: Diagnosis present

## 2016-01-28 DIAGNOSIS — Z7984 Long term (current) use of oral hypoglycemic drugs: Secondary | ICD-10-CM | POA: Diagnosis not present

## 2016-01-28 DIAGNOSIS — R112 Nausea with vomiting, unspecified: Secondary | ICD-10-CM | POA: Diagnosis not present

## 2016-01-28 DIAGNOSIS — Z79899 Other long term (current) drug therapy: Secondary | ICD-10-CM | POA: Diagnosis not present

## 2016-01-28 DIAGNOSIS — Z91013 Allergy to seafood: Secondary | ICD-10-CM | POA: Diagnosis not present

## 2016-01-28 DIAGNOSIS — E119 Type 2 diabetes mellitus without complications: Secondary | ICD-10-CM

## 2016-01-28 DIAGNOSIS — B961 Klebsiella pneumoniae [K. pneumoniae] as the cause of diseases classified elsewhere: Secondary | ICD-10-CM | POA: Diagnosis present

## 2016-01-28 DIAGNOSIS — E1142 Type 2 diabetes mellitus with diabetic polyneuropathy: Secondary | ICD-10-CM | POA: Diagnosis present

## 2016-01-28 DIAGNOSIS — Z7982 Long term (current) use of aspirin: Secondary | ICD-10-CM | POA: Diagnosis not present

## 2016-01-28 DIAGNOSIS — I10 Essential (primary) hypertension: Secondary | ICD-10-CM | POA: Diagnosis present

## 2016-01-28 DIAGNOSIS — Z880 Allergy status to penicillin: Secondary | ICD-10-CM | POA: Diagnosis not present

## 2016-01-28 DIAGNOSIS — Z1624 Resistance to multiple antibiotics: Secondary | ICD-10-CM | POA: Diagnosis present

## 2016-01-28 DIAGNOSIS — N12 Tubulo-interstitial nephritis, not specified as acute or chronic: Secondary | ICD-10-CM | POA: Diagnosis not present

## 2016-01-28 DIAGNOSIS — N179 Acute kidney failure, unspecified: Secondary | ICD-10-CM

## 2016-01-28 DIAGNOSIS — Z9049 Acquired absence of other specified parts of digestive tract: Secondary | ICD-10-CM | POA: Diagnosis not present

## 2016-01-28 DIAGNOSIS — R1031 Right lower quadrant pain: Secondary | ICD-10-CM | POA: Diagnosis present

## 2016-01-28 DIAGNOSIS — Z87820 Personal history of traumatic brain injury: Secondary | ICD-10-CM | POA: Diagnosis not present

## 2016-01-28 LAB — BASIC METABOLIC PANEL
Anion gap: 11 (ref 5–15)
BUN: 7 mg/dL (ref 6–20)
CALCIUM: 8 mg/dL — AB (ref 8.9–10.3)
CO2: 27 mmol/L (ref 22–32)
Chloride: 104 mmol/L (ref 101–111)
Creatinine, Ser: 1.19 mg/dL (ref 0.61–1.24)
GFR calc Af Amer: >60 mL/min (ref >60)
GLUCOSE: 127 mg/dL — AB (ref 65–99)
Potassium: 3.2 mmol/L — ABNORMAL LOW (ref 3.5–5.1)
Sodium: 142 mmol/L (ref 135–145)

## 2016-01-28 LAB — GLUCOSE, CAPILLARY
GLUCOSE-CAPILLARY: 155 mg/dL — AB (ref 65–99)
GLUCOSE-CAPILLARY: 68 mg/dL (ref 65–99)
GLUCOSE-CAPILLARY: 109 mg/dL — AB (ref 65–99)
Glucose-Capillary: 151 mg/dL — ABNORMAL HIGH (ref 65–99)
Glucose-Capillary: 110 mg/dL — ABNORMAL HIGH (ref 65–99)

## 2016-01-28 LAB — CULTURE, BLOOD (ROUTINE X 2)
CULTURE: NO GROWTH
Culture: NO GROWTH

## 2016-01-28 LAB — CBC
HEMATOCRIT: 33.9 % — AB (ref 39.0–52.0)
Hemoglobin: 10.9 g/dL — ABNORMAL LOW (ref 13.0–17.0)
MCH: 28.7 pg (ref 26.0–34.0)
MCHC: 32.2 g/dL (ref 30.0–36.0)
MCV: 89.2 fL (ref 78.0–100.0)
Platelets: 337 10*3/uL (ref 150–400)
RBC: 3.8 MIL/uL — ABNORMAL LOW (ref 4.22–5.81)
RDW: 15.3 % (ref 11.5–15.5)
WBC: 12.4 10*3/uL — ABNORMAL HIGH (ref 4.0–10.5)

## 2016-01-28 MED ORDER — ACETAMINOPHEN 325 MG PO TABS
650.0000 mg | ORAL_TABLET | Freq: Four times a day (QID) | ORAL | Status: DC | PRN
  Administered 2016-01-30: 650 mg via ORAL
  Filled 2016-01-28: qty 2

## 2016-01-28 MED ORDER — OXYCODONE HCL 5 MG PO TABS
15.0000 mg | ORAL_TABLET | Freq: Three times a day (TID) | ORAL | Status: DC | PRN
  Administered 2016-01-28 (×2): 15 mg via ORAL
  Filled 2016-01-28 (×3): qty 3

## 2016-01-28 MED ORDER — POTASSIUM CHLORIDE CRYS ER 20 MEQ PO TBCR
40.0000 meq | EXTENDED_RELEASE_TABLET | Freq: Once | ORAL | Status: AC
  Administered 2016-01-28: 40 meq via ORAL
  Filled 2016-01-28: qty 2

## 2016-01-28 MED ORDER — AMLODIPINE BESYLATE 10 MG PO TABS
10.0000 mg | ORAL_TABLET | Freq: Every day | ORAL | Status: DC
  Administered 2016-01-28 – 2016-01-29 (×3): 10 mg via ORAL
  Filled 2016-01-28 (×4): qty 1

## 2016-01-28 MED ORDER — DIPHENHYDRAMINE HCL 25 MG PO CAPS
25.0000 mg | ORAL_CAPSULE | Freq: Once | ORAL | Status: AC
  Administered 2016-01-28: 25 mg via ORAL
  Filled 2016-01-28: qty 1

## 2016-01-28 MED ORDER — SODIUM CHLORIDE 0.9 % IV SOLN
INTRAVENOUS | Status: DC
  Administered 2016-01-28 – 2016-01-30 (×4): via INTRAVENOUS

## 2016-01-28 MED ORDER — OXYCODONE HCL 5 MG PO TABS: 15.0000 mg | ORAL_TABLET | Freq: Four times a day (QID) | ORAL | Status: DC | PRN

## 2016-01-28 MED ORDER — PANTOPRAZOLE SODIUM 40 MG PO TBEC
40.0000 mg | DELAYED_RELEASE_TABLET | Freq: Every day | ORAL | Status: DC
  Administered 2016-01-28 – 2016-01-29 (×3): 40 mg via ORAL
  Filled 2016-01-28 (×4): qty 1

## 2016-01-28 MED ORDER — HYDROMORPHONE HCL 1 MG/ML IJ SOLN
0.5000 mg | INTRAMUSCULAR | Status: DC | PRN
  Administered 2016-01-29 (×4): 1 mg via INTRAVENOUS
  Filled 2016-01-28 (×4): qty 1

## 2016-01-28 MED ORDER — ASPIRIN EC 81 MG PO TBEC
81.0000 mg | DELAYED_RELEASE_TABLET | Freq: Every day | ORAL | Status: DC
  Administered 2016-01-28 – 2016-01-29 (×2): 81 mg via ORAL
  Filled 2016-01-28 (×3): qty 1

## 2016-01-28 MED ORDER — HYDROCERIN EX CREA
TOPICAL_CREAM | Freq: Two times a day (BID) | CUTANEOUS | Status: DC
  Administered 2016-01-28: 10:00:00 via TOPICAL
  Administered 2016-01-28: 1 via TOPICAL
  Administered 2016-01-28 – 2016-01-30 (×4): via TOPICAL
  Filled 2016-01-28: qty 113

## 2016-01-28 MED ORDER — ONDANSETRON HCL 4 MG PO TABS: 4.0000 mg | ORAL_TABLET | Freq: Four times a day (QID) | ORAL | Status: DC | PRN

## 2016-01-28 MED ORDER — OXYCODONE HCL 5 MG PO TABS
15.0000 mg | ORAL_TABLET | Freq: Once | ORAL | Status: AC
  Administered 2016-01-28: 15 mg via ORAL
  Filled 2016-01-28: qty 3

## 2016-01-28 MED ORDER — NICOTINE 21 MG/24HR TD PT24
21.0000 mg | MEDICATED_PATCH | Freq: Every day | TRANSDERMAL | Status: DC
  Administered 2016-01-28 – 2016-01-30 (×3): 21 mg via TRANSDERMAL
  Filled 2016-01-28 (×3): qty 1

## 2016-01-28 MED ORDER — PRAVASTATIN SODIUM 20 MG PO TABS
20.0000 mg | ORAL_TABLET | Freq: Every day | ORAL | Status: DC
  Administered 2016-01-28 – 2016-01-29 (×3): 20 mg via ORAL
  Filled 2016-01-28 (×4): qty 1

## 2016-01-28 MED ORDER — ACETAMINOPHEN 650 MG RE SUPP: 650.0000 mg | Freq: Four times a day (QID) | RECTAL | Status: DC | PRN

## 2016-01-28 MED ORDER — ENOXAPARIN SODIUM 60 MG/0.6ML ~~LOC~~ SOLN
60.0000 mg | Freq: Every day | SUBCUTANEOUS | Status: DC
  Administered 2016-01-28 – 2016-01-29 (×3): 60 mg via SUBCUTANEOUS
  Filled 2016-01-28 (×4): qty 0.6

## 2016-01-28 MED ORDER — OXYCODONE HCL 5 MG PO TABS
5.0000 mg | ORAL_TABLET | ORAL | Status: DC | PRN
  Filled 2016-01-28: qty 1

## 2016-01-28 MED ORDER — ONDANSETRON HCL 4 MG/2ML IJ SOLN: 4.0000 mg | Freq: Four times a day (QID) | INTRAMUSCULAR | Status: DC | PRN

## 2016-01-28 MED ORDER — DIPHENHYDRAMINE HCL 25 MG PO CAPS
25.0000 mg | ORAL_CAPSULE | Freq: Three times a day (TID) | ORAL | Status: DC | PRN
  Administered 2016-01-28 (×2): 25 mg via ORAL
  Filled 2016-01-28 (×2): qty 1

## 2016-01-28 NOTE — Progress Notes (Signed)
Notified Dr Tyrell Antonio that pt needs CBG orders for DM, routine am cbg was 68, pt asymptomatic, orange juice and crackers given, pt on home metformin, pt requesting pain regimen of oxy IR prn due to past accident/trauma. Awaiting response from attending.

## 2016-01-28 NOTE — Progress Notes (Signed)
Lovenox per Pharmacy for DVT Prophylaxis    Pharmacy has been consulted from dosing enoxaparin (lovenox) in this patient for DVT prophylaxis.  The pharmacist has reviewed pertinent labs (Hgb 12.2___; PLT_317__), patient weight (_131__kg) and renal function (CrCl_80__mL/min) and decided that enoxaparin 60__mg SQ Q_24_Hrs is appropriate for this patient.  The pharmacy department will sign off at this time.  Please reconsult pharmacy if status changes or for further issues.  Thank you  Cyndia Diver PharmD, BCPS  01/28/2016, 1:07 AM

## 2016-01-28 NOTE — Progress Notes (Signed)
PROGRESS NOTE    Miguel Brady  [id number] DOB: October 15, 1960 DOA: 01/27/2016 PCP: Benito Mccreedy, MD     Brief Narrative: Miguel Brady is a 55 y.o. male with a history of DM2, HTN who was diagnosed with Pyelonephritis on 05/09 and discharged home on 5-12  to continue on Ceftin Rx but returns to the ED with complaints of Nausea and Vomiting and ABd Pain and not being able to hold down his antibioitIcs. He also reports having dark urine.   Assessment & Plan:   Principal Problem:   Abdominal pain Active Problems:   DM2 (diabetes mellitus, type 2) (HCC)   HTN (hypertension)   Vomiting   AKI (acute kidney injury) ([place])   UTI (lower urinary tract infection)  1-Pyelonephritis;  Urine culture 5-09 grew Klebsiella Pneumonia sensitive ceftriaxone, cipro. Resistant to multiples medications.  Continue with IV antibiotics, ceftriaxone.  Follow up WBC trend.   2-Nausea, vomiting; abdominal pain; could be related to pyelonephritis. Will check KUB. PRN zofran.   3-AKI ; agree with holding metformin,. Continue with IV fluids.  Improving.   4-DM type 2; hold metformin. Check CBG.   5-HTN;continue with norvasc.   6-Chronic pain; resume home OxyContin.    DVT prophylaxis: lovenox Code Status: full code  Family Communication: care discussed with patient.  Disposition Plan: remain inpatient for IV antibiotics. Home at time of discharge   Consultants:   none  Procedures:  none  Antimicrobials:   Ceftriaxone   Subjective: He presents with nausea and vomiting, brown dark color.  Had splash of BM , stool.  Report abdominal pain as when he had pyelo.  He was not able to keep antibiotics down.    Objective: Filed Vitals:   01/28/16 0030 01/28/16 0048 01/28/16 0417 01/28/16 0816  BP: 134/82 117/75 122/69 131/61  Pulse:  62 61 66  Temp:  98.8 F (37.1 C) 97.8 F (36.6 C) 98 F (36.7 C)  TempSrc:  Oral Oral Oral  Resp:  16 18 18   Height:  6\' 1"  (1.854  m)    Weight:  131.6 kg (290 lb 2 oz)    SpO2:  97% 97% 98%    Intake/Output Summary (Last 24 hours) at 01/28/16 1202 Last data filed at 01/28/16 0900  Gross per 24 hour  Intake 2016.25 ml  Output   1275 ml  Net 741.25 ml   Filed Weights   01/28/16 0048  Weight: 131.6 kg (290 lb 2 oz)    Examination:  General exam: Appears calm and comfortable  Respiratory system: Clear to auscultation. Respiratory effort normal. Cardiovascular system: S1 & S2 heard, RRR. No JVD, murmurs, rubs, gallops or clicks. No pedal edema. Gastrointestinal system: Abdomen is nondistended, soft and nontender. No organomegaly or masses felt. Normal bowel sounds heard. Central nervous system: Alert and oriented. No focal neurological deficits. Extremities: Symmetric 5 x 5 power. Skin: No rashes, lesions or ulcers Psychiatry: Judgement and insight appear normal. Mood & affect appropriate.     Data Reviewed: I have personally reviewed following labs and imaging studies  CBC:  Recent Labs Lab 01/23/16 1324 01/24/16 0525 01/25/16 0349 01/26/16 0349 01/27/16 2109 01/28/16 0543  WBC 20.1* 20.4* 20.9* 15.4* 15.0* 12.4*  NEUTROABS 14.9*  --   --   --  10.5*  --   HGB 14.2 12.5* 11.7* 11.2* 12.2* 10.9*  HCT 44.2 39.3 37.1* 35.5* 36.2* 33.9*  MCV 90.0 89.1 90.3 90.3 87.4 89.2  PLT 302 280 273 293 317 337   Basic  Metabolic Panel:  Recent Labs Lab 01/23/16 1806 01/24/16 0525 01/25/16 0349 01/26/16 0349 01/27/16 2109 01/28/16 0543  NA  --  144 141 144 147* 142  K  --  3.4* 3.9 3.8 3.5 3.2*  CL  --  106 105 109 108 104  CO2  --  26 26 26 27 27   GLUCOSE  --  125* 157* 114* 123* 127*  BUN  --  9 14 11 10 7   CREATININE  --  1.18 1.25* 1.00 1.51* 1.19  CALCIUM  --  8.1* 8.1* 8.1* 8.8* 8.0*  MG 1.5*  --  2.1  --   --   --   PHOS 2.7  --   --   --   --   --    GFR: Estimated Creatinine Clearance: 101 mL/min (by C-G formula based on Cr of 1.19). Liver Function Tests:  Recent Labs Lab  01/23/16 1324 01/23/16 1806 01/27/16 2109  AST 15 13* 13*  ALT 14* 12* 14*  ALKPHOS 77 66 68  BILITOT 1.3* 1.5* 0.6  PROT 8.7* 7.8 7.8  ALBUMIN 3.7 3.3* 3.2*    Recent Labs Lab 01/23/16 1325 01/27/16 2109  LIPASE 25 22   No results for input(s): AMMONIA in the last 168 hours. Coagulation Profile: No results for input(s): INR, PROTIME in the last 168 hours. Cardiac Enzymes: No results for input(s): CKTOTAL, CKMB, CKMBINDEX, TROPONINI in the last 168 hours. BNP (last 3 results) No results for input(s): PROBNP in the last 8760 hours. HbA1C: No results for input(s): HGBA1C in the last 72 hours. CBG:  Recent Labs Lab 01/26/16 0809 01/28/16 0101 01/28/16 0814 01/28/16 0931 01/28/16 1142  GLUCAP 111* 155* 68 151* 110*   Lipid Profile: No results for input(s): CHOL, HDL, LDLCALC, TRIG, CHOLHDL, LDLDIRECT in the last 72 hours. Thyroid Function Tests: No results for input(s): TSH, T4TOTAL, FREET4, T3FREE, THYROIDAB in the last 72 hours. Anemia Panel: No results for input(s): VITAMINB12, FOLATE, FERRITIN, TIBC, IRON, RETICCTPCT in the last 72 hours. Urine analysis:    Component Value Date/Time   COLORURINE YELLOW 01/27/2016 2115   APPEARANCEUR CLOUDY* 01/27/2016 2115   LABSPEC 1.007 01/27/2016 2115   PHURINE 5.0 01/27/2016 2115   GLUCOSEU NEGATIVE 01/27/2016 2115   HGBUR MODERATE* 01/27/2016 2115   HGBUR small 06/06/2010 1519   BILIRUBINUR NEGATIVE 01/27/2016 2115   KETONESUR NEGATIVE 01/27/2016 2115   PROTEINUR NEGATIVE 01/27/2016 2115   UROBILINOGEN 1.0 07/27/2012 1708   NITRITE NEGATIVE 01/27/2016 2115   LEUKOCYTESUR NEGATIVE 01/27/2016 2115   Sepsis Labs:  Recent Labs Lab 01/23/16 1335 01/27/16 2115  LATICACIDVEN 1.49 0.66    Recent Results (from the past 240 hour(s))  Urine C&S     Status: Abnormal   Collection Time: 01/23/16  1:30 PM  Result Value Ref Range Status   Specimen Description URINE, CLEAN CATCH  Final   Special Requests NONE  Final    Culture >=100,000 COLONIES/mL KLEBSIELLA PNEUMONIAE (A)  Final   Report Status 01/25/2016 FINAL  Final   Organism ID, Bacteria KLEBSIELLA PNEUMONIAE (A)  Final      Susceptibility   Klebsiella pneumoniae - MIC*    AMPICILLIN >=32 RESISTANT Resistant     CEFAZOLIN >=64 RESISTANT Resistant     CEFTRIAXONE <=1 SENSITIVE Sensitive     CIPROFLOXACIN <=0.25 SENSITIVE Sensitive     GENTAMICIN <=1 SENSITIVE Sensitive     IMIPENEM <=0.25 SENSITIVE Sensitive     NITROFURANTOIN 32 SENSITIVE Sensitive     TRIMETH/SULFA <=20 SENSITIVE  Sensitive     AMPICILLIN/SULBACTAM >=32 RESISTANT Resistant     PIP/TAZO >=128 RESISTANT Resistant     * >=100,000 COLONIES/mL KLEBSIELLA PNEUMONIAE  MRSA PCR Screening     Status: None   Collection Time: 01/23/16  5:53 PM  Result Value Ref Range Status   MRSA by PCR NEGATIVE NEGATIVE Final    Comment:        The GeneXpert MRSA Assay (FDA approved for NASAL specimens only), is one component of a comprehensive MRSA colonization surveillance program. It is not intended to diagnose MRSA infection nor to guide or monitor treatment for MRSA infections.   Culture, blood (Routine X 2) w Reflex to ID Panel     Status: None   Collection Time: 01/23/16  6:06 PM  Result Value Ref Range Status   Specimen Description BLOOD LEFT HAND  Final   Special Requests BOTTLES DRAWN AEROBIC ONLY 5CC  Final   Culture   Final    NO GROWTH 5 DAYS Performed at [facility]    Report Status 01/28/2016 FINAL  Final  Culture, blood (Routine X 2) w Reflex to ID Panel     Status: None   Collection Time: 01/23/16  6:06 PM  Result Value Ref Range Status   Specimen Description BLOOD LEFT ANTECUBITAL  Final   Special Requests BOTTLES DRAWN AEROBIC AND ANAEROBIC 5CC  Final   Culture   Final    NO GROWTH 5 DAYS Performed at [facility]    Report Status 01/28/2016 FINAL  Final         Radiology Studies: No results found.      Scheduled Meds: . amLODipine  10  mg Oral QHS  . aspirin EC  81 mg Oral QHS  . cefTRIAXone (ROCEPHIN)  IV  1 g Intravenous Q24H  . enoxaparin (LOVENOX) injection  60 mg Subcutaneous QHS  . hydrocerin   Topical BID  . nicotine  21 mg Transdermal Daily  . pantoprazole  40 mg Oral QHS  . pravastatin  20 mg Oral QHS   Continuous Infusions: . sodium chloride 75 mL/hr at 01/28/16 0203        Time spent: 25 minutes,.    Elmarie Shiley, MD [facility] Pager [phone]  If 7PM-7AM, please contact night-coverage [email or web address] Password [facility] 01/28/2016, 12:02 PM

## 2016-01-29 LAB — BASIC METABOLIC PANEL
Anion gap: 7 (ref 5–15)
BUN: 9 mg/dL (ref 6–20)
CHLORIDE: 104 mmol/L (ref 101–111)
CO2: 31 mmol/L (ref 22–32)
CREATININE: 1.08 mg/dL (ref 0.61–1.24)
Calcium: 8.4 mg/dL — ABNORMAL LOW (ref 8.9–10.3)
GFR calc non Af Amer: >60 mL/min (ref >60)
Glucose, Bld: 109 mg/dL — ABNORMAL HIGH (ref 65–99)
POTASSIUM: 3.7 mmol/L (ref 3.5–5.1)
SODIUM: 142 mmol/L (ref 135–145)

## 2016-01-29 LAB — URINE CULTURE: Culture: NO GROWTH

## 2016-01-29 LAB — GLUCOSE, CAPILLARY
GLUCOSE-CAPILLARY: 117 mg/dL — AB (ref 65–99)
GLUCOSE-CAPILLARY: 99 mg/dL (ref 65–99)
Glucose-Capillary: 107 mg/dL — ABNORMAL HIGH (ref 65–99)

## 2016-01-29 LAB — CBC
HCT: 36.1 % — ABNORMAL LOW (ref 39.0–52.0)
HEMOGLOBIN: 11.3 g/dL — AB (ref 13.0–17.0)
MCH: 28.1 pg (ref 26.0–34.0)
MCHC: 31.3 g/dL (ref 30.0–36.0)
MCV: 89.8 fL (ref 78.0–100.0)
Platelets: 347 10*3/uL (ref 150–400)
RBC: 4.02 MIL/uL — AB (ref 4.22–5.81)
RDW: 15.2 % (ref 11.5–15.5)
WBC: 11.2 10*3/uL — AB (ref 4.0–10.5)

## 2016-01-29 LAB — MAGNESIUM: MAGNESIUM: 1.7 mg/dL (ref 1.7–2.4)

## 2016-01-29 MED ORDER — CEFUROXIME AXETIL 500 MG PO TABS
500.0000 mg | ORAL_TABLET | Freq: Two times a day (BID) | ORAL | Status: DC
  Administered 2016-01-29 – 2016-01-30 (×2): 500 mg via ORAL
  Filled 2016-01-29 (×4): qty 1

## 2016-01-29 NOTE — Care Management Note (Signed)
Case Management Note  Patient Details  Name: SAFI RYKER MRN: [id number] Date of Birth: [birth year]  Subjective/Objective:  55 y/o m admitted w/Pyelonephritis. From  home.                  Action/Plan:d/c plan home.   Expected Discharge Date:                 Expected Discharge Plan:  Home/Self Care  In-House Referral:     Discharge planning Services  CM Consult  Post Acute Care Choice:    Choice offered to:     DME Arranged:    DME Agency:     HH Arranged:    HH Agency:     Status of Service:  In process, will continue to follow  Medicare Important Message Given:    Date Medicare IM Given:    Medicare IM give by:    Date Additional Medicare IM Given:    Additional Medicare Important Message give by:     If discussed at [place] of Stay Meetings, dates discussed:    Additional Comments:  Dessa Phi, RN 01/29/2016, 2:25 PM

## 2016-01-29 NOTE — Progress Notes (Signed)
Nutrition Brief Note  Patient identified on the Malnutrition Screening Tool (MST) Report.  Patient's weight has been trending up since 2014. Pt eating 50-100% of meals.   Wt Readings from Last 15 Encounters:  01/28/16 290 lb 2 oz (131.6 kg)  01/23/16 300 lb (136.079 kg)  08/30/15 292 lb (132.45 kg)  08/16/15 292 lb 12.8 oz (132.813 kg)  04/02/15 290 lb (131.543 kg)  01/24/15 291 lb (131.997 kg)  05/14/13 290 lb (131.543 kg)  04/13/13 289 lb (131.09 kg)  03/05/13 285 lb (129.275 kg)  01/27/13 273 lb (123.832 kg)  12/30/12 271 lb 6.4 oz (123.106 kg)  12/04/12 271 lb 3.2 oz (123.016 kg)  10/09/12 253 lb (114.76 kg)  08/17/12 237 lb (107.502 kg)  08/10/12 240 lb 12.8 oz (109.226 kg)    Body mass index is 38.29 kg/(m^2). Patient meets criteria for obesity based on current BMI.   Current diet order is Heart Healthy/CHO modified, patient is consuming approximately 50-100% of meals at this time. Labs and medications reviewed.   No nutrition interventions warranted at this time. If nutrition issues arise, please consult RD.   Clayton Bibles, MS, RD, LDN Pager: [phone] After Hours Pager: [phone]

## 2016-01-29 NOTE — Progress Notes (Signed)
PROGRESS NOTE    Miguel Brady  [id number] DOB: 04-23-1961 DOA: 01/27/2016 PCP: Benito Mccreedy, MD     Brief Narrative: Miguel Brady is a 55 y.o. male with a history of DM2, HTN who was diagnosed with Pyelonephritis on 05/09 and discharged home on 5-12  to continue on Ceftin Rx but returns to the ED with complaints of Nausea and Vomiting and ABd Pain and not being able to hold down his antibioitIcs. He also reports having dark urine.   Assessment & Plan:   Principal Problem:   Abdominal pain Active Problems:   DM2 (diabetes mellitus, type 2) (HCC)   HTN (hypertension)   Vomiting   AKI (acute kidney injury) ([place])   UTI (lower urinary tract infection)  1-Pyelonephritis;  Urine culture 5-09 grew Klebsiella Pneumonia sensitive ceftriaxone, cipro. Resistant to multiples medications.  Change ceftriaxone to oral regimen.  WBC trending down.  Observed on oral antibiotics, to make sure patient will tolerates oral antibiotics.  He is asking to be tested for STD; I have order urine gonorrhea and chlamydia. HIV  2-Nausea, vomiting; abdominal pain; could be related to pyelonephritis. Will check KUB. PRN zofran.  Improved.   3-AKI ; agree with holding metformin,. Continue with IV fluids.  Improving.   4-DM type 2; hold metformin. Check CBG.   5-HTN;continue with norvasc.   6-Chronic pain; resume home OxyContin.    DVT prophylaxis: lovenox Code Status: full code  Family Communication: care discussed with patient.  Disposition Plan: observed on oral antibiotics.    Consultants:   none  Procedures:  none  Antimicrobials:   Ceftriaxone   Subjective: He report feeling better. Pain has decreased.  Wants to be tested for STD   Objective: Filed Vitals:   01/28/16 2221 01/29/16 0201 01/29/16 0645 01/29/16 1348  BP: 126/74 129/82 120/78 146/86  Pulse: 71 59 62 64  Temp: 98.6 F (37 C) 98.5 F (36.9 C) 97.9 F (36.6 C) 98.4 F (36.9 C)  TempSrc:  Oral Oral Oral   Resp: 19 18 18 18   Height:      Weight:      SpO2: 96% 95% 93% 97%    Intake/Output Summary (Last 24 hours) at 01/29/16 1504 Last data filed at 01/29/16 1500  Gross per 24 hour  Intake   1965 ml  Output   3550 ml  Net  -1585 ml   Filed Weights   01/28/16 0048  Weight: 131.6 kg (290 lb 2 oz)    Examination:  General exam: Appears calm and comfortable  Respiratory system: Clear to auscultation. Respiratory effort normal. Cardiovascular system: S1 & S2 heard, RRR. No JVD, murmurs, rubs, gallops or clicks. No pedal edema. Gastrointestinal system: Abdomen is nondistended, soft and nontender. No organomegaly or masses felt. Normal bowel sounds heard. Central nervous system: Alert and oriented. No focal neurological deficits. Extremities: Symmetric 5 x 5 power. Skin: No rashes, lesions or ulcers Psychiatry: Judgement and insight appear normal. Mood & affect appropriate.     Data Reviewed: I have personally reviewed following labs and imaging studies  CBC:  Recent Labs Lab 01/23/16 1324  01/25/16 0349 01/26/16 0349 01/27/16 2109 01/28/16 0543 01/29/16 0504  WBC 20.1*  < > 20.9* 15.4* 15.0* 12.4* 11.2*  NEUTROABS 14.9*  --   --   --  10.5*  --   --   HGB 14.2  < > 11.7* 11.2* 12.2* 10.9* 11.3*  HCT 44.2  < > 37.1* 35.5* 36.2* 33.9* 36.1*  MCV 90.0  < >  90.3 90.3 87.4 89.2 89.8  PLT 302  < > 273 293 317 337 347  < > = values in this interval not displayed. Basic Metabolic Panel:  Recent Labs Lab 01/23/16 1806  01/25/16 0349 01/26/16 0349 01/27/16 2109 01/28/16 0543 01/29/16 0504  NA  --   < > 141 144 147* 142 142  K  --   < > 3.9 3.8 3.5 3.2* 3.7  CL  --   < > 105 109 108 104 104  CO2  --   < > 26 26 27 27 31   GLUCOSE  --   < > 157* 114* 123* 127* 109*  BUN  --   < > 14 11 10 7 9   CREATININE  --   < > 1.25* 1.00 1.51* 1.19 1.08  CALCIUM  --   < > 8.1* 8.1* 8.8* 8.0* 8.4*  MG 1.5*  --  2.1  --   --   --  1.7  PHOS 2.7  --   --   --   --   --    --   < > = values in this interval not displayed. GFR: Estimated Creatinine Clearance: 111.3 mL/min (by C-G formula based on Cr of 1.08). Liver Function Tests:  Recent Labs Lab 01/23/16 1324 01/23/16 1806 01/27/16 2109  AST 15 13* 13*  ALT 14* 12* 14*  ALKPHOS 77 66 68  BILITOT 1.3* 1.5* 0.6  PROT 8.7* 7.8 7.8  ALBUMIN 3.7 3.3* 3.2*    Recent Labs Lab 01/23/16 1325 01/27/16 2109  LIPASE 25 22   No results for input(s): AMMONIA in the last 168 hours. Coagulation Profile: No results for input(s): INR, PROTIME in the last 168 hours. Cardiac Enzymes: No results for input(s): CKTOTAL, CKMB, CKMBINDEX, TROPONINI in the last 168 hours. BNP (last 3 results) No results for input(s): PROBNP in the last 8760 hours. HbA1C: No results for input(s): HGBA1C in the last 72 hours. CBG:  Recent Labs Lab 01/28/16 0814 01/28/16 0931 01/28/16 1142 01/28/16 1707 01/28/16 2134  GLUCAP 68 151* 110* 109* 107*   Lipid Profile: No results for input(s): CHOL, HDL, LDLCALC, TRIG, CHOLHDL, LDLDIRECT in the last 72 hours. Thyroid Function Tests: No results for input(s): TSH, T4TOTAL, FREET4, T3FREE, THYROIDAB in the last 72 hours. Anemia Panel: No results for input(s): VITAMINB12, FOLATE, FERRITIN, TIBC, IRON, RETICCTPCT in the last 72 hours. Urine analysis:    Component Value Date/Time   COLORURINE YELLOW 01/27/2016 2115   APPEARANCEUR CLOUDY* 01/27/2016 2115   LABSPEC 1.007 01/27/2016 2115   PHURINE 5.0 01/27/2016 2115   GLUCOSEU NEGATIVE 01/27/2016 2115   HGBUR MODERATE* 01/27/2016 2115   HGBUR small 06/06/2010 1519   BILIRUBINUR NEGATIVE 01/27/2016 2115   KETONESUR NEGATIVE 01/27/2016 2115   PROTEINUR NEGATIVE 01/27/2016 2115   UROBILINOGEN 1.0 07/27/2012 1708   NITRITE NEGATIVE 01/27/2016 2115   LEUKOCYTESUR NEGATIVE 01/27/2016 2115   Sepsis Labs:  Recent Labs Lab 01/23/16 1335 01/27/16 2115  LATICACIDVEN 1.49 0.66    Recent Results (from the past 240 hour(s))    Urine C&S     Status: Abnormal   Collection Time: 01/23/16  1:30 PM  Result Value Ref Range Status   Specimen Description URINE, CLEAN CATCH  Final   Special Requests NONE  Final   Culture >=100,000 COLONIES/mL KLEBSIELLA PNEUMONIAE (A)  Final   Report Status 01/25/2016 FINAL  Final   Organism ID, Bacteria KLEBSIELLA PNEUMONIAE (A)  Final      Susceptibility   Klebsiella  pneumoniae - MIC*    AMPICILLIN >=32 RESISTANT Resistant     CEFAZOLIN >=64 RESISTANT Resistant     CEFTRIAXONE <=1 SENSITIVE Sensitive     CIPROFLOXACIN <=0.25 SENSITIVE Sensitive     GENTAMICIN <=1 SENSITIVE Sensitive     IMIPENEM <=0.25 SENSITIVE Sensitive     NITROFURANTOIN 32 SENSITIVE Sensitive     TRIMETH/SULFA <=20 SENSITIVE Sensitive     AMPICILLIN/SULBACTAM >=32 RESISTANT Resistant     PIP/TAZO >=128 RESISTANT Resistant     * >=100,000 COLONIES/mL KLEBSIELLA PNEUMONIAE  MRSA PCR Screening     Status: None   Collection Time: 01/23/16  5:53 PM  Result Value Ref Range Status   MRSA by PCR NEGATIVE NEGATIVE Final    Comment:        The GeneXpert MRSA Assay (FDA approved for NASAL specimens only), is one component of a comprehensive MRSA colonization surveillance program. It is not intended to diagnose MRSA infection nor to guide or monitor treatment for MRSA infections.   Culture, blood (Routine X 2) w Reflex to ID Panel     Status: None   Collection Time: 01/23/16  6:06 PM  Result Value Ref Range Status   Specimen Description BLOOD LEFT HAND  Final   Special Requests BOTTLES DRAWN AEROBIC ONLY 5CC  Final   Culture   Final    NO GROWTH 5 DAYS Performed at [facility]    Report Status 01/28/2016 FINAL  Final  Culture, blood (Routine X 2) w Reflex to ID Panel     Status: None   Collection Time: 01/23/16  6:06 PM  Result Value Ref Range Status   Specimen Description BLOOD LEFT ANTECUBITAL  Final   Special Requests BOTTLES DRAWN AEROBIC AND ANAEROBIC 5CC  Final   Culture   Final    NO  GROWTH 5 DAYS Performed at [facility]    Report Status 01/28/2016 FINAL  Final  Urine culture     Status: None   Collection Time: 01/27/16  9:15 PM  Result Value Ref Range Status   Specimen Description URINE, CLEAN CATCH  Final   Special Requests NONE  Final   Culture NO GROWTH Performed at [facility]   Final   Report Status 01/29/2016 FINAL  Final         Radiology Studies: Dg Abd 1 View  01/28/2016  CLINICAL DATA:  Nausea and abdominal pain EXAM: ABDOMEN - 1 VIEW COMPARISON:  CT abdomen 01/23/2016 FINDINGS: Image quality degraded by large patient size. Nonobstructive bowel gas pattern. Orthopedic hardware across the SI joints bilaterally. Plate fixation of pubic symphysis. Three screws in the left femoral neck and intra medullary rod in left femur. Inferior vena cava filter in good position. IMPRESSION: Nonobstructive bowel gas pattern. Exam limited by large patient size. Electronically Signed   By: Franchot Gallo M.D.   On: 01/28/2016 12:25        Scheduled Meds: . amLODipine  10 mg Oral QHS  . aspirin EC  81 mg Oral QHS  . cefUROXime  500 mg Oral BID WC  . enoxaparin (LOVENOX) injection  60 mg Subcutaneous QHS  . hydrocerin   Topical BID  . nicotine  21 mg Transdermal Daily  . pantoprazole  40 mg Oral QHS  . pravastatin  20 mg Oral QHS   Continuous Infusions: . sodium chloride 75 mL/hr at 01/29/16 0530     LOS: 1 day    Time spent: 25 minutes,.    Lakela Kuba, [employer]  A, MD [facility] Pager [phone]  If 7PM-7AM, please contact night-coverage [email or web address] Password [facility] 01/29/2016, 3:04 PM

## 2016-01-30 LAB — GLUCOSE, CAPILLARY
GLUCOSE-CAPILLARY: 124 mg/dL — AB (ref 65–99)
GLUCOSE-CAPILLARY: 176 mg/dL — AB (ref 65–99)

## 2016-01-30 LAB — HIV ANTIBODY (ROUTINE TESTING W REFLEX): HIV Screen 4th Generation wRfx: NONREACTIVE

## 2016-01-30 LAB — RPR: RPR Ser Ql: NONREACTIVE

## 2016-01-30 LAB — GC/CHLAMYDIA PROBE AMP (~~LOC~~) NOT AT ARMC
Chlamydia: NEGATIVE
Neisseria Gonorrhea: NEGATIVE

## 2016-01-30 MED ORDER — CEFPODOXIME PROXETIL 200 MG PO TABS
200.0000 mg | ORAL_TABLET | Freq: Two times a day (BID) | ORAL | Status: DC
  Administered 2016-01-30: 200 mg via ORAL
  Filled 2016-01-30 (×2): qty 1

## 2016-01-30 MED ORDER — CEFPODOXIME PROXETIL 200 MG PO TABS: 200.0000 mg | ORAL_TABLET | Freq: Two times a day (BID) | ORAL | Status: DC

## 2016-01-30 MED ORDER — NICOTINE 21 MG/24HR TD PT24: 21.0000 mg | MEDICATED_PATCH | Freq: Every day | TRANSDERMAL | Status: DC

## 2016-01-30 MED ORDER — ENOXAPARIN SODIUM 80 MG/0.8ML ~~LOC~~ SOLN
0.5000 mg/kg | Freq: Every day | SUBCUTANEOUS | Status: DC
  Filled 2016-01-30: qty 0.8

## 2016-01-30 NOTE — Progress Notes (Signed)
Pt IV removed. AVS reviewed at bedside. PCP resources provided by Case Manager.  New prescriptions provided. No further questions. Kizzie Ide, RN

## 2016-01-30 NOTE — Discharge Summary (Signed)
Physician Discharge Summary  Miguel Brady VOH:[id number] DOB: 05/25/1961 DOA: 01/27/2016  PCP: Benito Mccreedy, MD  Admit date: 01/27/2016 Discharge date: 01/30/2016  Time spent: 35 minutes  Recommendations for Outpatient Follow-up:  Needs B-met to follow renal function.  Follow up  urine gonorrhea and chlamydia pending Encourage weight loss   Discharge Diagnoses:    Pyelonephritis.    Abdominal pain   DM2 (diabetes mellitus, type 2) (HCC)   HTN (hypertension)   Vomiting   AKI (acute kidney injury) ([place])   UTI (lower urinary tract infection)   Discharge Condition: stable  Diet recommendation: heart healthy   Filed Weights   01/28/16 0048  Weight: 131.6 kg (290 lb 2 oz)    History of present illness:  Miguel Brady is a 55 y.o. male with a history of DM2, HTN who was diagnosed with Pyelonephritis on 05/09 and discharged home to continue on Ceftin Rx but returns to the ED with complaints of Nausea and Vomiting and ABd Pain and not being able to hold down his antibioitIcs. He also reports having dark urine. He was evaluated in the ED and referred for Observation after administration of IVFs, and Anti-Emetics.   Hospital Course:  1-Pyelonephritis;  Urine culture 5-09 grew Klebsiella Pneumonia sensitive ceftriaxone, cipro. Resistant to multiples medications.  Change ceftriaxone to oral regimen. He will be discharge on cefpodoxime which is 3 rd generation as ceftriaxone. 10 more days to complete 14 days for pyelo  WBC trending down.  Patient oral antibiotics.  He is asking to be tested for STD; I have order urine gonorrhea and chlamydia pending .  HIV and RPR negative  2-Nausea, vomiting; abdominal pain; could be related to pyelonephritis. Will check KUB. PRN zofran.  Resolved. Tolerating diet. He was able to keep antibiotics down.   3-AKI ;resolved with IV fluids.  Resolved.   4-DM type 2;  metformin. Check CBG.   5-HTN;continue with norvasc.    6-Chronic pain; resume home OxyContin.   Procedures:  none  Consultations:  none  Discharge Exam: Filed Vitals:   01/30/16 0207 01/30/16 0600  BP: 120/82 127/88  Pulse: 77 68  Temp: 97.7 F (36.5 C) 97.8 F (36.6 C)  Resp: 20 18    General: NAD Cardiovascular: S 1, S 2 RRR Respiratory: CTA  Discharge Instructions   Discharge Instructions    Diet - low sodium heart healthy    Complete by:  As directed      Increase activity slowly    Complete by:  As directed           Current Discharge Medication List    START taking these medications   Details  cefpodoxime (VANTIN) 200 MG tablet Take 1 tablet (200 mg total) by mouth every 12 (twelve) hours. Qty: 20 tablet, Refills: 0    nicotine (NICODERM CQ - DOSED IN MG/24 HOURS) 21 mg/24hr patch Place 1 patch (21 mg total) onto the skin daily. Qty: 28 patch, Refills: 0      CONTINUE these medications which have NOT CHANGED   Details  amLODipine (NORVASC) 10 MG tablet Take 10 mg by mouth at bedtime.     aspirin EC 81 MG tablet Take 81 mg by mouth at bedtime.     chlorzoxazone (PARAFON) 500 MG tablet Take by mouth 4 (four) times daily as needed for muscle spasms.    cholecalciferol (VITAMIN D) 1000 UNITS tablet Take 1,000 Units by mouth at bedtime.     diphenhydrAMINE (SOMINEX) 25 MG tablet Take  25 mg by mouth daily as needed for allergies or sleep.    furosemide (LASIX) 40 MG tablet Take 40 mg by mouth at bedtime. Refills: 0    GLUCERNA (GLUCERNA) LIQD Take 237 mLs by mouth daily as needed (supplement a meal.).    GRALISE 600 MG TABS Take 600-1,200 mg by mouth at bedtime.  Refills: 4    !! metFORMIN (GLUCOPHAGE) 1000 MG tablet Take 1,000 mg by mouth at bedtime. Refills: 1    !! metFORMIN (GLUCOPHAGE) 500 MG tablet Take 500 mg by mouth daily as needed (hyperglycemia).     oxyCODONE (ROXICODONE) 15 MG immediate release tablet Take 15 mg by mouth 3 (three) times daily as needed for pain.  Refills: 0     pantoprazole (PROTONIX) 40 MG tablet Take 40 mg by mouth at bedtime.     potassium chloride (K-DUR,KLOR-CON) 10 MEQ tablet Take 10 mEq by mouth at bedtime.  Refills: 0    pravastatin (PRAVACHOL) 20 MG tablet Take 20 mg by mouth at bedtime. Refills: 1    testosterone cypionate (DEPOTESTOSTERONE CYPIONATE) 200 MG/ML injection Inject 1 mL into the muscle every 28 (twenty-eight) days. Refills: 0     !! - Potential duplicate medications found. Please discuss with provider.    STOP taking these medications     cefUROXime (CEFTIN) 500 MG tablet      lisinopril (PRINIVIL,ZESTRIL) 20 MG tablet        Allergies  Allergen Reactions  . Shellfish Allergy Anaphylaxis  . Penicillins Itching    Has patient had a PCN reaction causing immediate rash, facial/tongue/throat swelling, SOB or lightheadedness with hypotension: No Has patient had a PCN reaction causing severe rash involving mucus membranes or skin necrosis: No Has patient had a PCN reaction that required hospitalization No Has patient had a PCN reaction occurring within the last 10 years: No If all of the above answers are "NO", then may proceed with Cephalosporin use.      The results of significant diagnostics from this hospitalization (including imaging, microbiology, ancillary and laboratory) are listed below for reference.    Significant Diagnostic Studies: Dg Abd 1 View  01/28/2016  CLINICAL DATA:  Nausea and abdominal pain EXAM: ABDOMEN - 1 VIEW COMPARISON:  CT abdomen 01/23/2016 FINDINGS: Image quality degraded by large patient size. Nonobstructive bowel gas pattern. Orthopedic hardware across the SI joints bilaterally. Plate fixation of pubic symphysis. Three screws in the left femoral neck and intra medullary rod in left femur. Inferior vena cava filter in good position. IMPRESSION: Nonobstructive bowel gas pattern. Exam limited by large patient size. Electronically Signed   By: Franchot Gallo M.D.   On: 01/28/2016 12:25    US Scrotum  01/25/2016  CLINICAL DATA:  Knot on the right testicle for 2 years EXAM: ULTRASOUND OF SCROTUM TECHNIQUE: Complete ultrasound examination of the testicles, epididymis, and other scrotal structures was performed. COMPARISON:  None. FINDINGS: Right testicle Measurements: 12 x 17 x 14 mm. Heterogeneous abnormal echotexture of a small testicle, symmetric to the contralateral side. Testicles also appeared small on pelvis CT from 2014. Left testicle Measurements: 15 x 11 x 17 mm. Heterogeneous abnormal echotexture of the small testicle, symmetric to the contralateral side. Right epididymis:  No acute finding.  Simple appearing cysts. Left epididymis:  No acute finding.  Simple appearing cysts. Hydrocele:  None visualized. Varicocele:  None visualized. There is mobile fat within the inguinal canals, but no indication of hernia on pelvis CT from 2 days prior. This is presumably  mobile spermatic cord. IMPRESSION: Symmetric testicular atrophy. Related heterogeneity of the parenchyma could obscure an early mass. If there is clinical asymmetry or growth surveillance sonography is recommended. Electronically Signed   By: Monte Fantasia M.D.   On: 01/25/2016 17:55   Ct Abdomen Pelvis W Contrast  01/23/2016  CLINICAL DATA:  Right lower quadrant pain. Bilateral flank tenderness. EXAM: CT ABDOMEN AND PELVIS WITH CONTRAST TECHNIQUE: Multidetector CT imaging of the abdomen and pelvis was performed using the standard protocol following bolus administration of intravenous contrast. CONTRAST:  11m ISOVUE-300 IOPAMIDOL (ISOVUE-300) INJECTION 61% COMPARISON:  04/03/2012 FINDINGS: Lower chest and abdominal wall:  Wide necked fatty umbilical hernia. Hepatobiliary: No focal liver abnormality.No evidence of biliary obstruction or stone. Pancreas: Unremarkable. Spleen: Unremarkable. Adrenals/Urinary Tract: 13 mm left adrenal nodule, chronic based on 2013 CT and consistent with adenoma Thick walled bladder with  perivesicular fat stranding. 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 Stranding tracks along the left more than right ureters. There is asymmetric left perinephric edema without striated nephrogram. No hydronephrosis. Reproductive:No pathologic findings. Stomach/Bowel:  No obstruction. No appendicitis. Vascular/Lymphatic: IVC filter in good position. No acute vascular abnormality. No mass or adenopathy. Peritoneal: No ascites or pneumoperitoneum. Musculoskeletal: Remote pelvic fractures with bilateral sacroiliac and symphysis pubis fusion. Remote left femoral neck fracture status post fixation. 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 Cystitis with left ureteritis and probable early pyelonephritis. No hydronephrosis. 2. 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 Electronically Signed   By: JMonte FantasiaM.D.   On: 01/23/2016 15:00    Microbiology: Recent Results (from the past 240 hour(s))  Urine C&S     Status: Abnormal   Collection Time: 01/23/16  1:30 PM  Result Value Ref Range Status   Specimen Description URINE, CLEAN CATCH  Final   Special Requests NONE  Final   Culture >=100,000 COLONIES/mL KLEBSIELLA PNEUMONIAE (A)  Final   Report Status 01/25/2016 FINAL  Final   Organism ID, Bacteria KLEBSIELLA PNEUMONIAE (A)  Final      Susceptibility   Klebsiella pneumoniae - MIC*    AMPICILLIN >=32 RESISTANT Resistant     CEFAZOLIN >=64 RESISTANT Resistant     CEFTRIAXONE <=1 SENSITIVE Sensitive     CIPROFLOXACIN <=0.25 SENSITIVE Sensitive     GENTAMICIN <=1 SENSITIVE Sensitive     IMIPENEM <=0.25 SENSITIVE Sensitive     NITROFURANTOIN 32 SENSITIVE Sensitive     TRIMETH/SULFA <=20 SENSITIVE Sensitive     AMPICILLIN/SULBACTAM >=32 RESISTANT Resistant     PIP/TAZO >=128 RESISTANT Resistant     * >=100,000 COLONIES/mL KLEBSIELLA PNEUMONIAE   MRSA PCR Screening     Status: None   Collection Time: 01/23/16  5:53 PM  Result Value Ref Range Status   MRSA by PCR NEGATIVE NEGATIVE Final    Comment:        The GeneXpert MRSA Assay (FDA approved for NASAL specimens only), is one component of a comprehensive MRSA colonization surveillance program. It is not intended to diagnose MRSA infection nor to guide or monitor treatment for MRSA infections.   Culture, blood (Routine X 2) w Reflex to ID Panel     Status: None   Collection Time: 01/23/16  6:06 PM  Result Value Ref Range Status   Specimen Description BLOOD LEFT HAND  Final   Special Requests BOTTLES DRAWN AEROBIC ONLY 5CC  Final   Culture  Final    NO GROWTH 5 DAYS Performed at [facility]    Report Status 01/28/2016 FINAL  Final  Culture, blood (Routine X 2) w Reflex to ID Panel     Status: None   Collection Time: 01/23/16  6:06 PM  Result Value Ref Range Status   Specimen Description BLOOD LEFT ANTECUBITAL  Final   Special Requests BOTTLES DRAWN AEROBIC AND ANAEROBIC 5CC  Final   Culture   Final    NO GROWTH 5 DAYS Performed at [facility]    Report Status 01/28/2016 FINAL  Final  Urine culture     Status: None   Collection Time: 01/27/16  9:15 PM  Result Value Ref Range Status   Specimen Description URINE, CLEAN CATCH  Final   Special Requests NONE  Final   Culture NO GROWTH Performed at [facility]   Final   Report Status 01/29/2016 FINAL  Final     Labs: Basic Metabolic Panel:  Recent Labs Lab 01/23/16 1806  01/25/16 0349 01/26/16 0349 01/27/16 2109 01/28/16 0543 01/29/16 0504  NA  --   < > 141 144 147* 142 142  K  --   < > 3.9 3.8 3.5 3.2* 3.7  CL  --   < > 105 109 108 104 104  CO2  --   < > '26 26 27 27 31  ' GLUCOSE  --   < > 157* 114* 123* 127* 109*  BUN  --   < > '14 11 10 7 9  ' CREATININE  --   < > 1.25* 1.00 1.51* 1.19 1.08  CALCIUM  --   < > 8.1* 8.1* 8.8* 8.0* 8.4*  MG 1.5*  --  2.1  --   --   --  1.7  PHOS  2.7  --   --   --   --   --   --   < > = values in this interval not displayed. Liver Function Tests:  Recent Labs Lab 01/23/16 1324 01/23/16 1806 01/27/16 2109  AST 15 13* 13*  ALT 14* 12* 14*  ALKPHOS 77 66 68  BILITOT 1.3* 1.5* 0.6  PROT 8.7* 7.8 7.8  ALBUMIN 3.7 3.3* 3.2*    Recent Labs Lab 01/23/16 1325 01/27/16 2109  LIPASE 25 22   No results for input(s): AMMONIA in the last 168 hours. CBC:  Recent Labs Lab 01/23/16 1324  01/25/16 0349 01/26/16 0349 01/27/16 2109 01/28/16 0543 01/29/16 0504  WBC 20.1*  < > 20.9* 15.4* 15.0* 12.4* 11.2*  NEUTROABS 14.9*  --   --   --  10.5*  --   --   HGB 14.2  < > 11.7* 11.2* 12.2* 10.9* 11.3*  HCT 44.2  < > 37.1* 35.5* 36.2* 33.9* 36.1*  MCV 90.0  < > 90.3 90.3 87.4 89.2 89.8  PLT 302  < > 273 293 317 337 347  < > = values in this interval not displayed. Cardiac Enzymes: No results for input(s): CKTOTAL, CKMB, CKMBINDEX, TROPONINI in the last 168 hours. BNP: BNP (last 3 results) No results for input(s): BNP in the last 8760 hours.  ProBNP (last 3 results) No results for input(s): PROBNP in the last 8760 hours.  CBG:  Recent Labs Lab 01/28/16 1707 01/28/16 2134 01/29/16 1644 01/29/16 2137 01/30/16 0724  GLUCAP 109* 107* 117* 99 124*       Signed:  Niel Hummer A MD.  [facility] 01/30/2016, 11:18 AM

## 2016-01-30 NOTE — Progress Notes (Signed)
Patient meds are 4dollars or less under medicaid/patient is required to call the number on the back on the medicaid card to obtain pcp./Elfrida Pixley,BSN,RN3,CCM.

## 2016-10-31 ENCOUNTER — Emergency Department (HOSPITAL_COMMUNITY)
Admission: EM | Admit: 2016-10-31 | Discharge: 2016-10-31 | Disposition: A | Discharge status: Home / Self Care | Payer: Medicaid Other | Attending: Emergency Medicine | Admitting: Emergency Medicine

## 2016-10-31 ENCOUNTER — Emergency Department (HOSPITAL_COMMUNITY): Payer: Medicaid Other

## 2016-10-31 ENCOUNTER — Encounter (HOSPITAL_COMMUNITY): Payer: Self-pay

## 2016-10-31 DIAGNOSIS — M62830 Muscle spasm of back: Secondary | ICD-10-CM

## 2016-10-31 DIAGNOSIS — M79605 Pain in left leg: Secondary | ICD-10-CM

## 2016-10-31 DIAGNOSIS — M545 Low back pain, unspecified: Secondary | ICD-10-CM

## 2016-10-31 DIAGNOSIS — Y999 Unspecified external cause status: Secondary | ICD-10-CM | POA: Diagnosis not present

## 2016-10-31 DIAGNOSIS — Y939 Activity, unspecified: Secondary | ICD-10-CM | POA: Diagnosis not present

## 2016-10-31 DIAGNOSIS — M7918 Myalgia, other site: Secondary | ICD-10-CM

## 2016-10-31 DIAGNOSIS — M79652 Pain in left thigh: Secondary | ICD-10-CM | POA: Insufficient documentation

## 2016-10-31 DIAGNOSIS — Y92410 Unspecified street and highway as the place of occurrence of the external cause: Secondary | ICD-10-CM | POA: Diagnosis not present

## 2016-10-31 MED ORDER — NAPROXEN 500 MG PO TABS: 500.0000 mg | ORAL_TABLET | Freq: Two times a day (BID) | ORAL | 0 refills | Status: DC | PRN

## 2016-10-31 MED ORDER — HYDROCODONE-ACETAMINOPHEN 5-325 MG PO TABS
1.0000 | ORAL_TABLET | Freq: Once | ORAL | Status: AC
  Administered 2016-10-31: 1 via ORAL
  Filled 2016-10-31: qty 1

## 2016-10-31 MED ORDER — CYCLOBENZAPRINE HCL 10 MG PO TABS: 10.0000 mg | ORAL_TABLET | Freq: Three times a day (TID) | ORAL | 0 refills | Status: DC | PRN
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
## 2016-10-31 NOTE — ED Notes (Signed)
PT DISCHARGED. INSTRUCTIONS AND PRESCRIPTIONS GIVEN. AAOX4. PT IN NO APPARENT DISTRESS. THE OPPORTUNITY TO ASK QUESTIONS WAS PROVIDED. 

## 2016-10-31 NOTE — Discharge Instructions (Signed)
Take naprosyn as directed for inflammation and pain with tylenol or your home chronic narcotic pain medication for breakthrough pain, and use flexeril as directed as needed for muscle relaxation. Do not drive or operate machinery with muscle relaxant or narcotic medication use. Ice to areas of soreness for the next 24 hours and then may move to heat, no more than 20 minutes at a time every hour for each. Expect to be sore for the next few days and follow up with primary care physician for recheck of ongoing symptoms in the next 1-2 weeks. Return to ER for emergent changing or worsening of symptoms.
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
## 2016-11-13 ENCOUNTER — Encounter: Payer: Self-pay | Admitting: Physical Therapy

## 2016-11-13 ENCOUNTER — Ambulatory Visit: Payer: Medicaid Other | Attending: Family Medicine | Admitting: Physical Therapy

## 2016-11-13 DIAGNOSIS — M6281 Muscle weakness (generalized): Secondary | ICD-10-CM | POA: Insufficient documentation

## 2016-11-13 DIAGNOSIS — R2689 Other abnormalities of gait and mobility: Secondary | ICD-10-CM | POA: Diagnosis present

## 2016-11-13 DIAGNOSIS — M25552 Pain in left hip: Secondary | ICD-10-CM | POA: Diagnosis present
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
## 2016-11-13 NOTE — Patient Instructions (Addendum)
EXTENSION: Sitting (Active)    Sit with feet flat. Straighten right knee.  Complete __2_ sets of _10__ repetitions. Perform _2__ sessions per day.  http://gtsc.exer.us/268   Copyright  VHI. All rights reserved.   High Stepping in Place (Sitting)    Sitting, alternately lift knees as high as possible. Keep torso erect. Repeat _20___ times, each leg. 1 time per day  Copyright  VHI. All rights reserved.   ABDUCTION: Sitting (Active)    Sit with feet flat. Lift right leg slightly and draw it out to side.  Complete __1_ sets of _10__ repetitions. Perform ___ sessions per day.  Copyright  VHI. All rights reserved.   [facility] [address] [place], [place] [address] Phone # [phone] Fax [phone]

## 2016-11-15 ENCOUNTER — Ambulatory Visit: Payer: No Typology Code available for payment source | Attending: Family Medicine | Admitting: Physical Therapy

## 2016-11-15 DIAGNOSIS — R2689 Other abnormalities of gait and mobility: Secondary | ICD-10-CM | POA: Diagnosis present

## 2016-11-15 DIAGNOSIS — M6281 Muscle weakness (generalized): Secondary | ICD-10-CM | POA: Diagnosis present

## 2016-11-15 DIAGNOSIS — M25552 Pain in left hip: Secondary | ICD-10-CM | POA: Diagnosis present
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
## 2016-11-19 ENCOUNTER — Ambulatory Visit: Payer: No Typology Code available for payment source | Admitting: Physical Therapy

## 2016-11-19 DIAGNOSIS — M6281 Muscle weakness (generalized): Secondary | ICD-10-CM | POA: Diagnosis not present

## 2016-11-19 DIAGNOSIS — M25552 Pain in left hip: Secondary | ICD-10-CM

## 2016-11-19 DIAGNOSIS — R2689 Other abnormalities of gait and mobility: Secondary | ICD-10-CM
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
## 2016-11-20 ENCOUNTER — Encounter: Payer: Self-pay | Admitting: Physical Therapy

## 2016-11-20 ENCOUNTER — Ambulatory Visit: Payer: No Typology Code available for payment source | Admitting: Physical Therapy

## 2016-11-20 DIAGNOSIS — R2689 Other abnormalities of gait and mobility: Secondary | ICD-10-CM

## 2016-11-20 DIAGNOSIS — M6281 Muscle weakness (generalized): Secondary | ICD-10-CM | POA: Diagnosis not present

## 2016-11-20 DIAGNOSIS — M25552 Pain in left hip: Secondary | ICD-10-CM
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
## 2016-11-27 ENCOUNTER — Encounter: Payer: Self-pay | Admitting: Physical Therapy

## 2016-11-27 ENCOUNTER — Ambulatory Visit: Payer: No Typology Code available for payment source | Admitting: Physical Therapy

## 2016-11-27 DIAGNOSIS — M6281 Muscle weakness (generalized): Secondary | ICD-10-CM | POA: Diagnosis not present

## 2016-11-27 DIAGNOSIS — M25552 Pain in left hip: Secondary | ICD-10-CM

## 2016-11-27 DIAGNOSIS — R2689 Other abnormalities of gait and mobility: Secondary | ICD-10-CM
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
## 2016-11-28 ENCOUNTER — Encounter: Payer: Self-pay | Admitting: Physical Therapy

## 2016-11-28 ENCOUNTER — Ambulatory Visit: Payer: No Typology Code available for payment source | Admitting: Physical Therapy

## 2016-11-28 DIAGNOSIS — M6281 Muscle weakness (generalized): Secondary | ICD-10-CM

## 2016-11-28 DIAGNOSIS — R2689 Other abnormalities of gait and mobility: Secondary | ICD-10-CM

## 2016-11-28 DIAGNOSIS — M25552 Pain in left hip: Secondary | ICD-10-CM
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
## 2016-12-02 ENCOUNTER — Ambulatory Visit: Payer: No Typology Code available for payment source | Admitting: Physical Therapy

## 2016-12-02 ENCOUNTER — Encounter: Payer: Self-pay | Admitting: Physical Therapy

## 2016-12-02 DIAGNOSIS — M6281 Muscle weakness (generalized): Secondary | ICD-10-CM

## 2016-12-02 DIAGNOSIS — M25552 Pain in left hip: Secondary | ICD-10-CM

## 2016-12-02 DIAGNOSIS — R2689 Other abnormalities of gait and mobility: Secondary | ICD-10-CM
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
## 2016-12-05 ENCOUNTER — Ambulatory Visit: Payer: No Typology Code available for payment source | Admitting: Physical Therapy

## 2016-12-05 DIAGNOSIS — M6281 Muscle weakness (generalized): Secondary | ICD-10-CM | POA: Diagnosis not present

## 2016-12-05 DIAGNOSIS — M25552 Pain in left hip: Secondary | ICD-10-CM

## 2016-12-05 DIAGNOSIS — R2689 Other abnormalities of gait and mobility: Secondary | ICD-10-CM
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
## 2016-12-10 ENCOUNTER — Ambulatory Visit: Payer: No Typology Code available for payment source | Admitting: Physical Therapy

## 2016-12-10 DIAGNOSIS — R2689 Other abnormalities of gait and mobility: Secondary | ICD-10-CM

## 2016-12-10 DIAGNOSIS — M25552 Pain in left hip: Secondary | ICD-10-CM

## 2016-12-10 DIAGNOSIS — M6281 Muscle weakness (generalized): Secondary | ICD-10-CM
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
## 2016-12-12 ENCOUNTER — Encounter: Payer: Self-pay | Admitting: Physical Therapy

## 2016-12-12 ENCOUNTER — Ambulatory Visit: Payer: No Typology Code available for payment source | Admitting: Physical Therapy

## 2016-12-12 DIAGNOSIS — M6281 Muscle weakness (generalized): Secondary | ICD-10-CM | POA: Diagnosis not present

## 2016-12-12 DIAGNOSIS — M25552 Pain in left hip: Secondary | ICD-10-CM

## 2016-12-12 DIAGNOSIS — R2689 Other abnormalities of gait and mobility: Secondary | ICD-10-CM
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
## 2016-12-17 ENCOUNTER — Encounter: Payer: Self-pay | Admitting: Physical Therapy

## 2016-12-17 ENCOUNTER — Ambulatory Visit: Payer: No Typology Code available for payment source | Attending: Family Medicine | Admitting: Physical Therapy

## 2016-12-17 DIAGNOSIS — M6281 Muscle weakness (generalized): Secondary | ICD-10-CM | POA: Diagnosis present

## 2016-12-17 DIAGNOSIS — M25552 Pain in left hip: Secondary | ICD-10-CM | POA: Diagnosis present

## 2016-12-17 DIAGNOSIS — R2689 Other abnormalities of gait and mobility: Secondary | ICD-10-CM | POA: Diagnosis present
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
## 2016-12-19 ENCOUNTER — Ambulatory Visit: Payer: No Typology Code available for payment source | Admitting: Physical Therapy

## 2016-12-19 ENCOUNTER — Encounter: Payer: Self-pay | Admitting: Physical Therapy

## 2016-12-19 DIAGNOSIS — M25552 Pain in left hip: Secondary | ICD-10-CM

## 2016-12-19 DIAGNOSIS — M6281 Muscle weakness (generalized): Secondary | ICD-10-CM

## 2016-12-19 DIAGNOSIS — R2689 Other abnormalities of gait and mobility: Secondary | ICD-10-CM
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
## 2016-12-24 ENCOUNTER — Ambulatory Visit: Payer: No Typology Code available for payment source | Admitting: Physical Therapy

## 2016-12-24 ENCOUNTER — Encounter: Payer: Self-pay | Admitting: Physical Therapy

## 2016-12-24 DIAGNOSIS — R2689 Other abnormalities of gait and mobility: Secondary | ICD-10-CM

## 2016-12-24 DIAGNOSIS — M6281 Muscle weakness (generalized): Secondary | ICD-10-CM | POA: Diagnosis not present

## 2016-12-24 DIAGNOSIS — M25552 Pain in left hip: Secondary | ICD-10-CM
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
## 2016-12-26 ENCOUNTER — Ambulatory Visit: Payer: No Typology Code available for payment source | Admitting: Physical Therapy

## 2016-12-26 ENCOUNTER — Encounter: Payer: Self-pay | Admitting: Physical Therapy

## 2016-12-26 DIAGNOSIS — M6281 Muscle weakness (generalized): Secondary | ICD-10-CM | POA: Diagnosis not present

## 2016-12-26 DIAGNOSIS — R2689 Other abnormalities of gait and mobility: Secondary | ICD-10-CM

## 2016-12-26 DIAGNOSIS — M25552 Pain in left hip: Secondary | ICD-10-CM
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
## 2016-12-31 ENCOUNTER — Ambulatory Visit: Payer: No Typology Code available for payment source | Admitting: Physical Therapy

## 2016-12-31 ENCOUNTER — Encounter: Payer: Self-pay | Admitting: Physical Therapy

## 2016-12-31 DIAGNOSIS — M6281 Muscle weakness (generalized): Secondary | ICD-10-CM | POA: Diagnosis not present

## 2016-12-31 DIAGNOSIS — M25552 Pain in left hip: Secondary | ICD-10-CM

## 2016-12-31 DIAGNOSIS — R2689 Other abnormalities of gait and mobility: Secondary | ICD-10-CM
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
## 2017-01-02 ENCOUNTER — Ambulatory Visit: Payer: No Typology Code available for payment source | Admitting: Physical Therapy

## 2017-01-02 ENCOUNTER — Encounter: Payer: Self-pay | Admitting: Physical Therapy

## 2017-01-02 DIAGNOSIS — M6281 Muscle weakness (generalized): Secondary | ICD-10-CM | POA: Diagnosis not present

## 2017-01-02 DIAGNOSIS — R2689 Other abnormalities of gait and mobility: Secondary | ICD-10-CM

## 2017-01-02 DIAGNOSIS — M25552 Pain in left hip: Secondary | ICD-10-CM
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
## 2017-01-07 ENCOUNTER — Encounter: Payer: Self-pay | Admitting: Physical Therapy

## 2017-01-07 ENCOUNTER — Ambulatory Visit: Payer: No Typology Code available for payment source | Admitting: Physical Therapy

## 2017-01-07 DIAGNOSIS — R2689 Other abnormalities of gait and mobility: Secondary | ICD-10-CM

## 2017-01-07 DIAGNOSIS — M6281 Muscle weakness (generalized): Secondary | ICD-10-CM | POA: Diagnosis not present

## 2017-01-07 DIAGNOSIS — M25552 Pain in left hip: Secondary | ICD-10-CM
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
## 2017-01-09 ENCOUNTER — Ambulatory Visit: Payer: No Typology Code available for payment source | Admitting: Physical Therapy

## 2017-01-09 ENCOUNTER — Encounter: Payer: Self-pay | Admitting: Physical Therapy

## 2017-01-09 DIAGNOSIS — M6281 Muscle weakness (generalized): Secondary | ICD-10-CM

## 2017-01-09 DIAGNOSIS — M25552 Pain in left hip: Secondary | ICD-10-CM

## 2017-01-09 DIAGNOSIS — R2689 Other abnormalities of gait and mobility: Secondary | ICD-10-CM
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
## 2017-01-14 ENCOUNTER — Encounter: Payer: Self-pay | Admitting: Physical Therapy

## 2017-01-14 ENCOUNTER — Ambulatory Visit: Payer: Medicaid Other | Attending: Family Medicine | Admitting: Physical Therapy

## 2017-01-14 DIAGNOSIS — M25552 Pain in left hip: Secondary | ICD-10-CM | POA: Diagnosis present

## 2017-01-14 DIAGNOSIS — M6281 Muscle weakness (generalized): Secondary | ICD-10-CM | POA: Diagnosis present

## 2017-01-14 DIAGNOSIS — R2689 Other abnormalities of gait and mobility: Secondary | ICD-10-CM | POA: Diagnosis present
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
## 2017-01-16 ENCOUNTER — Encounter: Payer: Self-pay | Admitting: Physical Therapy

## 2017-01-16 ENCOUNTER — Ambulatory Visit: Payer: Medicaid Other | Admitting: Physical Therapy

## 2017-01-16 DIAGNOSIS — M6281 Muscle weakness (generalized): Secondary | ICD-10-CM | POA: Diagnosis not present

## 2017-01-16 DIAGNOSIS — M25552 Pain in left hip: Secondary | ICD-10-CM

## 2017-01-16 DIAGNOSIS — R2689 Other abnormalities of gait and mobility: Secondary | ICD-10-CM
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
## 2017-01-21 ENCOUNTER — Ambulatory Visit: Payer: Medicaid Other | Admitting: Physical Therapy

## 2017-01-21 DIAGNOSIS — M25552 Pain in left hip: Secondary | ICD-10-CM

## 2017-01-21 DIAGNOSIS — M6281 Muscle weakness (generalized): Secondary | ICD-10-CM | POA: Diagnosis not present

## 2017-01-21 DIAGNOSIS — R2689 Other abnormalities of gait and mobility: Secondary | ICD-10-CM
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
## 2017-01-23 ENCOUNTER — Encounter: Payer: Self-pay | Admitting: Physical Therapy

## 2017-01-23 ENCOUNTER — Ambulatory Visit: Payer: Medicaid Other | Admitting: Physical Therapy

## 2017-01-23 DIAGNOSIS — M6281 Muscle weakness (generalized): Secondary | ICD-10-CM

## 2017-01-23 DIAGNOSIS — R2689 Other abnormalities of gait and mobility: Secondary | ICD-10-CM

## 2017-01-23 DIAGNOSIS — M25552 Pain in left hip: Secondary | ICD-10-CM
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
## 2017-01-28 ENCOUNTER — Encounter: Payer: Self-pay | Admitting: Physical Therapy

## 2017-01-28 ENCOUNTER — Ambulatory Visit: Payer: Medicaid Other | Admitting: Physical Therapy

## 2017-01-28 DIAGNOSIS — M6281 Muscle weakness (generalized): Secondary | ICD-10-CM

## 2017-01-28 DIAGNOSIS — R2689 Other abnormalities of gait and mobility: Secondary | ICD-10-CM

## 2017-01-28 DIAGNOSIS — M25552 Pain in left hip: Secondary | ICD-10-CM
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
## 2017-01-30 ENCOUNTER — Ambulatory Visit: Payer: Medicaid Other | Admitting: Physical Therapy

## 2017-01-30 DIAGNOSIS — R2689 Other abnormalities of gait and mobility: Secondary | ICD-10-CM

## 2017-01-30 DIAGNOSIS — M6281 Muscle weakness (generalized): Secondary | ICD-10-CM | POA: Diagnosis not present

## 2017-01-30 DIAGNOSIS — M25552 Pain in left hip: Secondary | ICD-10-CM
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
## 2017-02-04 ENCOUNTER — Ambulatory Visit: Payer: Medicaid Other | Admitting: Physical Therapy

## 2017-02-04 ENCOUNTER — Encounter: Payer: Self-pay | Admitting: Physical Therapy

## 2017-02-04 DIAGNOSIS — M25552 Pain in left hip: Secondary | ICD-10-CM

## 2017-02-04 DIAGNOSIS — M6281 Muscle weakness (generalized): Secondary | ICD-10-CM | POA: Diagnosis not present

## 2017-02-04 DIAGNOSIS — R2689 Other abnormalities of gait and mobility: Secondary | ICD-10-CM

## 2017-02-04 NOTE — Patient Instructions (Signed)
ABDUCTION: Standing - Resistance Band (Active)   Stand, feet flat. Against yellow resistance band, lift right leg out to side. Complete __2_ sets of __10_ repetitions. Perform _1__ sessions per day.  ADDUCTION: Standing - Stable: Resistance Band (Active)   Stand, right leg out to side as far as possible. Against yellow resistance band, draw leg in across midline. Complete _2__ sets of __10_ repetitions. Perform __1_ sessions per day.  Strengthening: Hip Flexion - Resisted   With tubing around left ankle, anchor behind, bring leg forward, keeping knee straight. Repeat __10__ times per set. Do _2___ sets per session. Do _1___ sessions per day.  Strengthening: Hip Extension - Resisted   With tubing around right ankle, face anchor and pull leg straight back. Repeat __10__ times per set. Do _2___ sets per session. Do __1__ sessions per day.   HIP: Abduction - Supine (Band)   Place band around legs. Move one leg away from body. Keep knee and toes straight. __10_ reps per set, _2__ sets per day, __3_ days per week Move both legs away from body at same time.  Copyright  VHI. All rights reserved.   Mikle Bosworth, PTA 02/04/17 2:39 PM  [facility] [address], [place], [place] [address] Phone # [phone] Fax [phone]
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
## 2017-02-06 ENCOUNTER — Encounter: Payer: Medicaid Other | Admitting: Physical Therapy

## 2018-02-04 ENCOUNTER — Emergency Department (HOSPITAL_COMMUNITY): Payer: Medicaid Other

## 2018-02-04 ENCOUNTER — Encounter (HOSPITAL_COMMUNITY): Payer: Self-pay

## 2018-02-04 ENCOUNTER — Emergency Department (HOSPITAL_COMMUNITY)
Admission: EM | Admit: 2018-02-04 | Discharge: 2018-02-04 | Disposition: A | Discharge status: Home / Self Care | Payer: Medicaid Other | Attending: Emergency Medicine | Admitting: Emergency Medicine

## 2018-02-04 ENCOUNTER — Other Ambulatory Visit: Payer: Self-pay

## 2018-02-04 DIAGNOSIS — I10 Essential (primary) hypertension: Secondary | ICD-10-CM | POA: Diagnosis not present

## 2018-02-04 DIAGNOSIS — S52501A Unspecified fracture of the lower end of right radius, initial encounter for closed fracture: Secondary | ICD-10-CM | POA: Diagnosis not present

## 2018-02-04 DIAGNOSIS — Y929 Unspecified place or not applicable: Secondary | ICD-10-CM | POA: Diagnosis not present

## 2018-02-04 DIAGNOSIS — Z79899 Other long term (current) drug therapy: Secondary | ICD-10-CM | POA: Insufficient documentation

## 2018-02-04 DIAGNOSIS — F17210 Nicotine dependence, cigarettes, uncomplicated: Secondary | ICD-10-CM | POA: Insufficient documentation

## 2018-02-04 DIAGNOSIS — Z7982 Long term (current) use of aspirin: Secondary | ICD-10-CM | POA: Insufficient documentation

## 2018-02-04 DIAGNOSIS — E119 Type 2 diabetes mellitus without complications: Secondary | ICD-10-CM | POA: Diagnosis not present

## 2018-02-04 DIAGNOSIS — W0110XA Fall on same level from slipping, tripping and stumbling with subsequent striking against unspecified object, initial encounter: Secondary | ICD-10-CM | POA: Insufficient documentation

## 2018-02-04 DIAGNOSIS — W19XXXA Unspecified fall, initial encounter: Secondary | ICD-10-CM

## 2018-02-04 DIAGNOSIS — R030 Elevated blood-pressure reading, without diagnosis of hypertension: Secondary | ICD-10-CM | POA: Insufficient documentation

## 2018-02-04 DIAGNOSIS — Y999 Unspecified external cause status: Secondary | ICD-10-CM | POA: Insufficient documentation

## 2018-02-04 DIAGNOSIS — Z7984 Long term (current) use of oral hypoglycemic drugs: Secondary | ICD-10-CM | POA: Diagnosis not present

## 2018-02-04 DIAGNOSIS — Y939 Activity, unspecified: Secondary | ICD-10-CM | POA: Diagnosis not present

## 2018-02-04 DIAGNOSIS — S0990XA Unspecified injury of head, initial encounter: Secondary | ICD-10-CM | POA: Diagnosis present

## 2018-02-04 MED ORDER — ACETAMINOPHEN 325 MG PO TABS
650.0000 mg | ORAL_TABLET | Freq: Once | ORAL | Status: AC
  Administered 2018-02-04: 650 mg via ORAL
  Filled 2018-02-04: qty 2

## 2018-02-04 MED ORDER — ACETAMINOPHEN 325 MG PO TABS: 650.0000 mg | ORAL_TABLET | Freq: Four times a day (QID) | ORAL | 0 refills | Status: DC | PRN

## 2018-02-04 NOTE — Discharge Instructions (Signed)
Please see the information and instructions below regarding your visit.  Your diagnoses today include:  1. Closed fracture of distal end of right radius, unspecified fracture morphology, initial encounter   2. Fall, initial encounter   3. Elevated blood pressure reading with diagnosis of hypertension    Impression that she has an fracture in your wrist.  It is very stable.  You will need to follow-up with either your primary doctor or Dr. Caralyn Guile of hand surgery.  You may need a second x-ray in 7 to 10 days of your elbow if it is still causing the pain.  Please keep your splint and sling on until then.  Tests performed today include: See side panel of your discharge paperwork for testing performed today. Vital signs are listed at the bottom of these instructions.   CT scan of your head was negative for any bleeding.  X-rays of your elbow, forearm, and tailbone were negative for fracture.  Medications prescribed:    Take any prescribed medications only as prescribed, and any over the counter medications only as directed on the packaging.  Please take Tylenol, 650 mg every 6 hours as needed for pain.  Home care instructions:  Please follow any educational materials contained in this packet.   Follow-up instructions: Please follow-up with your primary care provider in one week for further evaluation of your symptoms if they are not completely improved.   Please follow up with Dr. Caralyn Guile of hand surgery.  Return instructions:  Please return to the Emergency Department if you experience worsening symptoms.  Please return to the emergency department if you develop any loss of sensation or weakness in your hand, changes in color or is turning white or blue, or increasing pain in the right hand. Please return if you have any other emergent concerns.  Additional Information:   Your vital signs today were: BP (!) 160/109 (BP Location: Left Arm)    Pulse 77    Temp 98.2 F (36.8 C)  (Oral)    Resp 16    Ht 6' (1.829 m)    Wt 90.7 kg (200 lb)    SpO2 100%    BMI 27.12 kg/m  If your blood pressure (BP) was elevated on multiple readings during this visit above 130 for the top number or above 80 for the bottom number, please have this repeated by your primary care provider within one month. --------------  Thank you for allowing [place] to participate in your care today.
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
## 2018-02-04 NOTE — ED Notes (Signed)
Ortho tech at bedside 

## 2018-02-04 NOTE — Progress Notes (Signed)
Orthopedic Tech Progress Note Patient Details:  Miguel Brady 1960/11/30 [id number]  Ortho Devices Type of Ortho Device: Ace wrap, Arm sling, Volar splint Ortho Device/Splint Location: RUE Ortho Device/Splint Interventions: Ordered, Application   Post Interventions Patient Tolerated: Well Instructions Provided: Care of device   Braulio Bosch 02/04/2018, 6:42 PM

## 2018-02-04 NOTE — ED Notes (Signed)
Pt wheeled to room by this RN.

## 2018-02-04 NOTE — ED Notes (Signed)
PA at bedside.

## 2018-02-04 NOTE — ED Notes (Signed)
Patient transported to CT 

## 2018-02-04 NOTE — ED Notes (Signed)
Pt given brief to change into, was unable to make it to use the urinal, declined to allow this RN to assist him to change briefs.

## 2018-02-04 NOTE — ED Notes (Signed)
Ortho tech paged  

## 2018-02-04 NOTE — ED Notes (Signed)
Pts arm propped up on bedside table.

## 2018-02-04 NOTE — ED Notes (Signed)
Pt given urinal.

## 2018-02-13 DIAGNOSIS — S52509A Unspecified fracture of the lower end of unspecified radius, initial encounter for closed fracture: Secondary | ICD-10-CM | POA: Insufficient documentation

## 2018-07-26 ENCOUNTER — Emergency Department (HOSPITAL_COMMUNITY)
Admission: EM | Admit: 2018-07-26 | Discharge: 2018-07-26 | Disposition: A | Discharge status: Home / Self Care | Payer: Medicaid Other | Attending: Emergency Medicine | Admitting: Emergency Medicine

## 2018-07-26 ENCOUNTER — Emergency Department (HOSPITAL_COMMUNITY): Payer: Medicaid Other

## 2018-07-26 ENCOUNTER — Other Ambulatory Visit: Payer: Self-pay

## 2018-07-26 DIAGNOSIS — Y999 Unspecified external cause status: Secondary | ICD-10-CM | POA: Diagnosis not present

## 2018-07-26 DIAGNOSIS — R531 Weakness: Secondary | ICD-10-CM | POA: Diagnosis not present

## 2018-07-26 DIAGNOSIS — R51 Headache: Secondary | ICD-10-CM | POA: Insufficient documentation

## 2018-07-26 DIAGNOSIS — M542 Cervicalgia: Secondary | ICD-10-CM | POA: Diagnosis not present

## 2018-07-26 DIAGNOSIS — Z7984 Long term (current) use of oral hypoglycemic drugs: Secondary | ICD-10-CM | POA: Diagnosis not present

## 2018-07-26 DIAGNOSIS — Y9289 Other specified places as the place of occurrence of the external cause: Secondary | ICD-10-CM | POA: Diagnosis not present

## 2018-07-26 DIAGNOSIS — I10 Essential (primary) hypertension: Secondary | ICD-10-CM | POA: Diagnosis not present

## 2018-07-26 DIAGNOSIS — Y9389 Activity, other specified: Secondary | ICD-10-CM | POA: Diagnosis not present

## 2018-07-26 DIAGNOSIS — Z7982 Long term (current) use of aspirin: Secondary | ICD-10-CM | POA: Insufficient documentation

## 2018-07-26 DIAGNOSIS — Z79899 Other long term (current) drug therapy: Secondary | ICD-10-CM | POA: Diagnosis not present

## 2018-07-26 DIAGNOSIS — F10920 Alcohol use, unspecified with intoxication, uncomplicated: Secondary | ICD-10-CM | POA: Insufficient documentation

## 2018-07-26 DIAGNOSIS — W19XXXA Unspecified fall, initial encounter: Secondary | ICD-10-CM | POA: Diagnosis not present

## 2018-07-26 DIAGNOSIS — R55 Syncope and collapse: Secondary | ICD-10-CM | POA: Diagnosis present

## 2018-07-26 DIAGNOSIS — F17210 Nicotine dependence, cigarettes, uncomplicated: Secondary | ICD-10-CM | POA: Insufficient documentation

## 2018-07-26 DIAGNOSIS — E119 Type 2 diabetes mellitus without complications: Secondary | ICD-10-CM | POA: Insufficient documentation

## 2018-07-26 LAB — CBG MONITORING, ED: GLUCOSE-CAPILLARY: 94 mg/dL (ref 70–99)

## 2018-07-26 NOTE — ED Notes (Addendum)
Pt sat up on side of the bed, urinated with no complication or instability noted. Pt then refused to walk any further. Will inform EDP.

## 2018-07-26 NOTE — ED Provider Notes (Signed)
[facility] EMERGENCY DEPARTMENT Provider Note   CSN: [id number] Arrival date & time: 07/26/18  0114     History   Chief Complaint Chief Complaint  Patient presents with  . Fall    HPI Miguel Brady is a 57 y.o. male.  Patient with history of DM, HTN, HLD, substance abuse, presents after being found on the ground outside a bar where the patient states he passed out. Unwitnessed event. He states he was in a bar drinking and remembers walking outside the bar but nothing afterward until he woke to EMS personnel surrounding him. No nausea or vomiting. He complains of neck pain and headache and points to a bump on his head where he feels he hit the ground.   The history is provided by the patient and the EMS personnel. No language interpreter was used.  Fall  Associated symptoms include headaches. Pertinent negatives include no chest pain, no abdominal pain and no shortness of breath.    Past Medical History:  Diagnosis Date  . Diabetes mellitus   . GERD 05/05/2007  . Headache(784.0)   . Hyperlipidemia   . Hypertension   . 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 Obesity   . 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  no inhaler  . Substance abuse ([place])    ETOH  . TOBACCO ABUSE 12/23/2008  . TRIGGER FINGER 05/05/2007  . Tuberculosis    pos PPD    Patient Active Problem List   Diagnosis Date Noted  . Vomiting 01/27/2016  . Abdominal pain 01/27/2016  . AKI (acute kidney injury) ([place]) 01/27/2016  . UTI (lower urinary tract infection) 01/27/2016  . Pyelonephritis 01/23/2016  . Nausea with vomiting 01/23/2016  . Sepsis ([place]) 01/23/2016  . Hypokalemia   . Left flank pain   . Leukocytosis   . Facial cellulitis 10/01/2015  . Periorbital cellulitis 09/30/2015  . HTN (hypertension) 09/30/2015  . Cellulitis diffuse,  face   . Facial swelling   . 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 Acute cholecystitis with chronic cholecystitis 07/27/2012  . TBI (traumatic brain injury) ([place]) 04/22/2012  . Traumatic closed fx of eight or more ribs with minimal displacement 04/03/2012  . Pelvic fracture ([place]) 04/03/2012  . Femur open fracture, left ([place]) 04/03/2012  . Open left tibial fracture 04/03/2012  . Lumbar transverse process fracture ([place]) 04/03/2012  . Frontal skull fracture ([place]) 04/03/2012  . Patella fracture, left 04/03/2012  . SKIN LESION 03/02/2010  . DENTAL CARIES 02/07/2010  . HEADACHE 02/07/2010  . HLD (hyperlipidemia) 01/30/2009  . OBESITY 12/23/2008  . TOBACCO ABUSE 12/23/2008  . 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 DM2 (diabetes mellitus, type 2) ([place]) 09/15/2008  . GERD 05/05/2007  . TRIGGER FINGER 05/05/2007  . INJURY NOS, FINGER 05/05/2007  . 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 CHEST TUBE INSERTION  04/03/2012   Procedure: CHEST TUBE INSERTION;  Surgeon: Zenovia Jarred, MD;  Location: [facility];  Service: General;  Laterality: Left;  . CHOLECYSTECTOMY  07/28/2012   Procedure: LAPAROSCOPIC CHOLECYSTECTOMY;  Surgeon: Harl Bowie, MD;  Location: [facility];  Service: General;  Laterality: N/A;  . EXTERNAL FIXATION LEG  04/03/2012   Procedure: EXTERNAL FIXATION LEG;  Surgeon:  Rozanna Box, MD;  Location: [place];  Service: Orthopedics;  Laterality: Left;  Left femur  . EXTERNAL FIXATION PELVIS  04/03/2012   Procedure: EXTERNAL FIXATION PELVIS;  Surgeon: Rozanna Box, MD;  Location: [place];  Service: Orthopedics;;  . FEMUR IM NAIL  04/07/2012   Procedure: INTRAMEDULLARY (IM) NAIL FEMORAL;  Surgeon: Rozanna Box, MD;  Location: [place] [facility];  Service: Orthopedics;  Laterality: Left;  . FLEXIBLE BRONCHOSCOPY  04/07/2012   Procedure: FLEXIBLE BRONCHOSCOPY;  Surgeon: Zenovia Jarred, MD;  Location:  [place];  Service: General;;  START TIME=1645 END TIME=1700  . INCISION AND DRAINAGE OF WOUND  04/03/2012   Procedure: IRRIGATION AND DEBRIDEMENT WOUND;  Surgeon: Otilio Connors, MD;  Location: [place] [facility];  Service: Neurosurgery;  Laterality: N/A;  Frontal.  . ORIF PATELLA  04/07/2012   Procedure: OPEN REDUCTION INTERNAL (ORIF) FIXATION PATELLA;  Surgeon: Rozanna Box, MD;  Location: [place];  Service: Orthopedics;  Laterality: Left;  . ORIF PELVIC FRACTURE  04/07/2012   Procedure: OPEN REDUCTION INTERNAL FIXATION (ORIF) PELVIC FRACTURE;  Surgeon: Rozanna Box, MD;  Location: [place];  Service: Orthopedics;  Laterality: N/A;  Right and left sacroiliac screw pinning,Irrigation and debridebridement open tibia and femur,removal external fixator.  . TIBIA IM NAIL INSERTION  04/07/2012   Procedure: INTRAMEDULLARY (IM) NAIL TIBIAL;  Surgeon: Rozanna Box, MD;  Location: [place];  Service: Orthopedics;  Laterality: Left;        Home Medications    Prior to Admission medications   Medication Sig Start Date End Date Taking? Authorizing Provider  acetaminophen (TYLENOL) 325 MG tablet Take 2 tablets (650 mg total) by mouth every 6 (six) hours as needed. 02/04/18   Langston Masker B, PA-C  amLODipine (NORVASC) 10 MG tablet Take 10 mg by mouth at bedtime.     [provider]  aspirin EC 81 MG tablet Take 81 mg by mouth at bedtime.     [provider]  cefpodoxime (VANTIN) 200 MG tablet Take 1 tablet (200 mg total) by mouth every 12 (twelve) hours. 01/30/16   Regalado, Belkys A, MD  chlorzoxazone (PARAFON) 500 MG tablet Take by mouth 4 (four) times daily as needed for muscle spasms.    [provider]  cholecalciferol (VITAMIN D) 1000 UNITS tablet Take 1,000 Units by mouth at bedtime.     [provider]  cyclobenzaprine (FLEXERIL) 10 MG tablet Take 1 tablet (10 mg total) by mouth 3 (three) times daily as needed for muscle spasms. 10/31/16   Street, Mercedes, PA-C  diphenhydrAMINE  (SOMINEX) 25 MG tablet Take 25 mg by mouth daily as needed for allergies or sleep.    [provider]  furosemide (LASIX) 40 MG tablet Take 40 mg by mouth at bedtime. 08/21/15   [provider]  GLUCERNA (GLUCERNA) LIQD Take 237 mLs by mouth daily as needed (supplement a meal.).    [provider]  GRALISE 600 MG TABS Take 600-1,200 mg by mouth at bedtime.  07/07/15   [provider]  metFORMIN (GLUCOPHAGE) 1000 MG tablet Take 1,000 mg by mouth at bedtime. 07/07/15   [provider]  metFORMIN (GLUCOPHAGE) 500 MG tablet Take 500 mg by mouth daily as needed (hyperglycemia).     [provider]  naproxen (NAPROSYN) 500 MG tablet Take 1 tablet (500 mg total) by mouth 2 (two) times daily as needed for mild pain, moderate pain or headache (TAKE WITH MEALS.). 10/31/16   Street, [place],  PA-C  nicotine (NICODERM CQ - DOSED IN MG/24 HOURS) 21 mg/24hr patch Place 1 patch (21 mg total) onto the skin daily. 01/30/16   Regalado, Belkys A, MD  oxyCODONE (ROXICODONE) 15 MG immediate release tablet Take 15 mg by mouth 3 (three) times daily as needed for pain.  09/25/15   [provider]  pantoprazole (PROTONIX) 40 MG tablet Take 40 mg by mouth at bedtime.     [provider]  potassium chloride (K-DUR,KLOR-CON) 10 MEQ tablet Take 10 mEq by mouth at bedtime.  09/21/15   [provider]  pravastatin (PRAVACHOL) 20 MG tablet Take 20 mg by mouth at bedtime. 09/15/15   [provider]  testosterone cypionate (DEPOTESTOSTERONE CYPIONATE) 200 MG/ML injection Inject 1 mL into the muscle every 28 (twenty-eight) days. 09/21/15   [provider]    Family History Family History  Problem Relation Age of Onset  . Cancer Father   . Colon cancer Father   . Colon cancer Paternal Uncle        pt thinks 8 pat uncles  . Esophageal cancer Neg Hx   . Rectal cancer Neg Hx   . Stomach cancer Neg Hx     Social History Social History    Tobacco Use  . Smoking status: Current Every Day Smoker    Packs/day: 0.50    Years: 30.00    Pack years: 15.00    Types: Cigarettes  . Smokeless tobacco: Never Used  Substance Use Topics  . Alcohol use: Yes    Alcohol/week: 0.0 standard drinks    Comment: occ  . Drug use: No    Types: Marijuana    Comment: pt denies marijuana use for couple of years     Allergies   Shellfish allergy and Penicillins   Review of Systems Review of Systems  Constitutional: Negative for chills and fever.  Respiratory: Negative.  Negative for shortness of breath.   Cardiovascular: Negative.  Negative for chest pain.  Gastrointestinal: Negative.  Negative for abdominal pain, nausea and vomiting.  Musculoskeletal: Positive for neck pain. Negative for back pain.  Skin: Negative.  Negative for wound.  Neurological: Positive for syncope and headaches.     Physical Exam Updated Vital Signs BP (!) 162/108   Pulse 86   Temp (!) 97.5 F (36.4 C)   Resp 20   Ht 6\' 2"  (1.88 m)   Wt 90 kg   SpO2 99%   BMI 25.47 kg/m   Physical Exam  Constitutional: He is oriented to person, place, and time. He appears well-developed and well-nourished.  Patient is acutely intoxicated.  HENT:  Head: Normocephalic.  No scalp wounds, bleeding or significant hematomas visualized.  Eyes: Conjunctivae are normal.  Neck: Normal range of motion. Neck supple.  Cardiovascular: Normal rate and regular rhythm.  Pulmonary/Chest: Effort normal and breath sounds normal. He has no wheezes. He has no rales. He exhibits no tenderness.  Chest wall atraumatic in appearance.  Abdominal: Soft. Bowel sounds are normal. There is no tenderness. There is no rebound and no guarding.  Abdominal wall atraumatic in appearance.  Musculoskeletal: Normal range of motion. He exhibits no tenderness or deformity.  There is mild midline cervical tenderness without deformity or swelling.   Neurological: He is alert and oriented to person,  place, and time. No sensory deficit. Coordination normal.  Follows command. No gross cranial nerve deficits. Moves all extremities with equal coordination with exception of left LE. Patient noted to have chronic left LE weakness.  Skin: Skin is warm and dry. No rash noted.  Psychiatric: He has a normal mood and affect.  Nursing note and vitals reviewed.    ED Treatments / Results  Labs (all labs ordered are listed, but only abnormal results are displayed) Labs Reviewed - No data to display  EKG None  Radiology No results found.  Procedures Procedures (including critical care time)  Medications Ordered in ED Medications - No data to display   Initial Impression / Assessment and Plan / ED Course  I have reviewed the triage vital signs and the nursing notes.  Pertinent labs & imaging results that were available during my care of the patient were reviewed by me and considered in my medical decision making (see chart for details).     Patient to ED after being found unconscious outside a bar where he had been drinking. He is acutely intoxicated but alert, oriented, and coordinated.   CT head and neck performed and show no acute findings. Collar removed. VSS. Recheck on the patient finds him resting. No mental status change.   He is able to stand up without assistance and appears stable. Manages a urinal without unsteadiness or imbalance. He can be discharged home with as needed follow up.  Final Clinical Impressions(s) / ED Diagnoses   Final diagnoses:  None   1. Alcohol intoxication  ED Discharge Orders    None       Charlann Lange, PA-C 97/74/14 [id number]    Delora Fuel, MD 32/02/33 [phone]

## 2018-07-26 NOTE — Discharge Instructions (Addendum)
Followup with your doctor as needed

## 2018-07-26 NOTE — ED Triage Notes (Addendum)
Per EMS, patient was found behind a bar after he fell down (patient states that he passed out). States he had "lost" his crutch. Patient has been drinking heavily this evening.

## 2019-03-17 ENCOUNTER — Telehealth: Payer: Self-pay

## 2019-03-17 NOTE — Progress Notes (Signed)
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 Jarred, MD;  Location: [facility];  Service: General;;  START TIME=1645 END TIME=1700  . INCISION AND DRAINAGE OF WOUND  04/03/2012   Procedure: IRRIGATION AND DEBRIDEMENT WOUND;  Surgeon: Otilio Connors, MD;  Location: [facility];  Service: Neurosurgery;  Laterality: N/A;  Frontal.  . ORIF PATELLA  04/07/2012   Procedure: OPEN REDUCTION INTERNAL (ORIF) FIXATION PATELLA;  Surgeon: Rozanna Box, MD;  Location: [facility];  Service: Orthopedics;  Laterality: Left;  . ORIF PELVIC FRACTURE  04/07/2012   Procedure: OPEN REDUCTION INTERNAL FIXATION (ORIF) PELVIC FRACTURE;  Surgeon: Rozanna Box, MD;  Location: [facility];  Service: Orthopedics;  Laterality: N/A;  Right and left sacroiliac screw pinning,Irrigation and debridebridement open tibia and femur,removal external fixator.  . TIBIA IM NAIL INSERTION  04/07/2012   Procedure: INTRAMEDULLARY (IM) NAIL TIBIAL;  Surgeon: Rozanna Box, MD;  Location: [facility];  Service: Orthopedics;  Laterality: Left;   Active Ambulatory Problems    Diagnosis Date Noted  . DM2 (diabetes mellitus, type 2) ([place]) 09/15/2008  . HLD (hyperlipidemia) 01/30/2009  . OBESITY 12/23/2008  . TOBACCO ABUSE 12/23/2008  . 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 DENTAL CARIES 02/07/2010  . GERD 05/05/2007  . SKIN LESION 03/02/2010  . TRIGGER FINGER 05/05/2007  . HEADACHE 02/07/2010  . 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 INJURY NOS, FINGER 05/05/2007  . ERECTILE DYSFUNCTION, ORGANIC, HX OF 05/05/2007  . Traumatic closed fx of eight or more ribs with minimal displacement 04/03/2012  . Pelvic fracture ([place]) 04/03/2012  . Femur open fracture, left ([place]) 04/03/2012  . Open left tibial fracture 04/03/2012  . Lumbar transverse process fracture ([place]) 04/03/2012  . Frontal skull fracture ([place]) 04/03/2012  . Patella fracture, left 04/03/2012  . TBI (traumatic brain injury) ([place]) 04/22/2012  . Acute cholecystitis with chronic cholecystitis 07/27/2012  . 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 Periorbital cellulitis 09/30/2015  . HTN (hypertension) 09/30/2015  . Cellulitis diffuse, face   . Facial swelling   . Facial cellulitis 10/01/2015  . Pyelonephritis 01/23/2016  . Nausea with vomiting 01/23/2016  . Sepsis ([place]) 01/23/2016  . Hypokalemia   . Left flank pain   . Leukocytosis   . Vomiting 01/27/2016  . Abdominal pain 01/27/2016  . AKI (acute kidney injury) ([place]) 01/27/2016  . UTI (lower urinary tract infection) 01/27/2016   Resolved Ambulatory Problems    Diagnosis Date Noted  . Hemothorax, left 04/03/2012  . Acute blood loss anemia 04/03/2012   Past Medical History:  Diagnosis Date  . Diabetes mellitus   . Hyperlipidemia   . Hypertension   . Neuromuscular disorder ([place])   . Neuropathy due to medical condition ([place])   . Obesity   . Substance abuse ([place])   . Tuberculosis    Constitutional Exam  General appearance: alert, cooperative and  morbidly obese Vitals:   03/18/19 1226  BP: (!) 156/103  Pulse: 81  Resp: 16  Temp: 98.2 F (36.8 C)  TempSrc: Oral  SpO2: 98%  Weight: 259 lb (117.5 kg)  Height: '5\' 11"'  (1.803 m)   BMI Assessment: Estimated body mass index is 36.12 kg/m as calculated from the following:   Height as of this encounter: '5\' 11"'  (1.803 m).   Weight as of this  encounter: 259 lb (117.5 kg).  BMI interpretation table: BMI level Category Range association with higher incidence of chronic pain  <18 kg/m2 Underweight   18.5-24.9 kg/m2 Ideal body weight   25-29.9 kg/m2 Overweight Increased incidence by 20%  30-34.9 kg/m2 Obese (Class I) Increased incidence by 68%  35-39.9 kg/m2 Severe obesity (Class II) Increased incidence by 136%  >40 kg/m2 Extreme obesity (Class III) Increased incidence by 254%   Patient's current BMI Ideal Body weight  Body mass index is 36.12 kg/m. Ideal body weight: 75.3 kg (166 lb 0.1 oz) Adjusted ideal body weight: 92.2 kg (203 lb 3.3 oz)   BMI Readings from Last 4 Encounters:  03/18/19 36.12 kg/m  07/26/18 25.47 kg/m  02/04/18 27.12 kg/m  01/28/16 38.28 kg/m   Wt Readings from Last 4 Encounters:  03/18/19 259 lb (117.5 kg)  07/26/18 198 lb 6.6 oz (90 kg)  02/04/18 200 lb (90.7 kg)  01/28/16 290 lb 2 oz (131.6 kg)  Psych/Mental status: Alert, oriented x 3 (person, place, & time)       Eyes: PERLA Respiratory: No evidence of acute respiratory distress  Cervical Spine Area Exam  Skin & Axial Inspection: No masses, redness, edema, swelling, or associated skin lesions Alignment: Symmetrical Functional ROM: Decreased ROM      Stability: No instability detected Muscle Tone/Strength: Functionally intact. No obvious neuro-muscular anomalies detected. Sensory (Neurological): Musculoskeletal pain pattern Palpation: No palpable anomalies              Upper Extremity (UE) Exam    Side: Right upper extremity  Side: Left upper extremity  Skin & Extremity Inspection: Skin  color, temperature, and hair growth are WNL. No peripheral edema or cyanosis. No masses, redness, swelling, asymmetry, or associated skin lesions. No contractures.  Skin & Extremity Inspection: Skin color, temperature, and hair growth are WNL. No peripheral edema or cyanosis. No masses, redness, swelling, asymmetry, or associated skin lesions. No contractures.  Functional ROM: Pain restricted ROM          Functional ROM: Pain restricted ROM          Muscle Tone/Strength: Functionally intact. No obvious neuro-muscular anomalies detected.  Muscle Tone/Strength: Functionally intact. No obvious neuro-muscular anomalies detected.  Sensory (Neurological): Unimpaired          Sensory (Neurological): Unimpaired          Palpation: No palpable anomalies              Palpation: No palpable anomalies              Provocative Test(s):  Phalen's test: deferred Tinel's test: deferred Apley's scratch test (touch opposite shoulder):  Action 1 (Across chest): deferred Action 2 (Overhead): deferred Action 3 (LB reach): deferred   Provocative Test(s):  Phalen's test: deferred Tinel's test: deferred Apley's scratch test (touch opposite shoulder):  Action 1 (Across chest): deferred Action 2 (Overhead): deferred Action 3 (LB reach): deferred      Lumbar Spine Area Exam  Skin & Axial Inspection: No masses, redness, or swelling Alignment: Symmetrical Functional ROM: Decreased ROM       Stability: No instability detected Muscle Tone/Strength: Functionally intact. No obvious neuro-muscular anomalies detected. Sensory (Neurological): Musculoskeletal pain pattern Palpation: No palpable anomalies       Provocative Tests: Hyperextension/rotation test: (+) bilaterally for facet joint pain.   Gait & Posture Assessment  Ambulation: Limited Gait: Significantly limited. Dependent on assistive device to ambulate Posture: Difficulty standing up straight, due to pain   Lower Extremity  Exam    Side: Right lower  extremity  Side: Left lower extremity  Stability: No instability observed          Stability: No instability observed          Skin & Extremity Inspection: Skin color, temperature, and hair growth are WNL. No peripheral edema or cyanosis. No masses, redness, swelling, asymmetry, or associated skin lesions. No contractures.  Skin & Extremity Inspection: Evidence of prior arthroplastic surgery  Functional ROM: Mechanically restricted ROM for all joints of the lower extremity          Functional ROM: Mechanically restricted ROM for all joints of the lower extremity          Muscle Tone/Strength: Moderate-to-severe deconditioning  Muscle Tone/Strength: Moderate-to-severe deconditioning  Sensory (Neurological): Neurogenic pain pattern        Sensory (Neurological): Neurogenic pain pattern        DTR: Patellar: 0: absent Achilles: 0: absent Plantar: deferred today  DTR: Patellar: 0: absent Achilles: 0: absent Plantar: deferred today  Palpation: No palpable anomalies  Palpation: No palpable anomalies   Assessment  Primary Diagnosis & Pertinent Problem List: The primary encounter diagnosis was Closed fracture of transverse process of lumbar vertebra, sequela. Diagnoses of Closed fracture of frontal bone, sequela (HCC), Neuropathy of left peroneal nerve, Traumatic brain injury with loss of consciousness, sequela ([place]), Closed nondisplaced fracture of pelvis with routine healing, unspecified part of pelvis, subsequent encounter, Chronic pain syndrome, Marijuana abuse, and Alcohol abuse were also pertinent to this visit.  Visit Diagnosis (New problems to examiner): 1. Closed fracture of transverse process of lumbar vertebra, sequela   2. Closed fracture of frontal bone, sequela ([place])   3. Neuropathy of left peroneal nerve   4. Traumatic brain injury with loss of consciousness, sequela (HCC)   5. Closed nondisplaced fracture of pelvis with routine healing, unspecified part of pelvis, subsequent  encounter   6. Chronic pain syndrome   7. Marijuana abuse   8. Alcohol abuse    Plan of Care (Initial workup plan)   I had extensive discussion with the patient about the goals of pain management.  We discussed nonpharmacological approaches to pain management that include physical therapy, dieting, sleep hygiene, psychotherapy, interventional therapy.  We discussed the importance of understanding the type of pain including neuropathic, nociceptive, centralized.  I also stressed the importance of multimodal analgesia with an emphasis on nondrug modalities including self management, behavioral health support and physical therapy.  We discussed the importance of physical therapy and how a individualized physical therapy and occupational therapy program tailored to patient limitations can be helpful at improving physical function. We also discussed the importance of insomnia and disrupted sleep and how improved sleep hygiene and cognitive therapy could be helpful.  Psychotherapy including CBT, mind-body therapies, pain coping strategies can be helpful for patients whose pain impacts mood, sleep, quality of life, relationships with others.  We discussed avoiding benzodiazepines.  I also had an extensive discussion with the patient about interventional therapies which is my expertise and how these could be incorporated into an effective multimodal pain management plan.  Patient is high risk for opioid prescribing. The patient will not be a candidate for opioid therapy through this clinic. Patient was offered trial of non opioid analgesics and consideration of physical therapy to help with his balance and gait but he declined. He is only interested in opioid narcotics which we will not be able to prescribe given prior history of DUI, alcohol abuse,  marijuana use, cocaine abuse and history of medication noncompliance.   Time Note: Greater than 50% of the 30 minute(s) of face-to-face time spent with Mr. Cumbie,  was spent in counseling/coordination of care regarding: the appropriate use of the pain scale, Mr. Coppola primary cause of pain, the treatment plan, treatment alternatives, realistic expectations, the goals of pain management (increased in functionality) and the difference between disability and impairment. Provider-requested follow-up:  No future appointments.  Primary Care Physician: Benito Mccreedy, MD Location: [facility] Note by: Gillis [place], MD Date: 03/18/2019; Time: 2:21 PM  Note: This dictation was prepared with Dragon dictation. Any transcriptional errors that may result from this process are unintentional.

## 2019-03-17 NOTE — Telephone Encounter (Signed)
Attempted to call patient. Unable to reach. Left message on answering machine to call us back .

## 2019-03-18 ENCOUNTER — Ambulatory Visit
Payer: Medicaid Other | Attending: Student in an Organized Health Care Education/Training Program | Admitting: Student in an Organized Health Care Education/Training Program

## 2019-03-18 ENCOUNTER — Encounter: Payer: Self-pay | Admitting: Student in an Organized Health Care Education/Training Program

## 2019-03-18 ENCOUNTER — Other Ambulatory Visit: Payer: Self-pay

## 2019-03-18 VITALS — BP 156/103 | HR 81 | Temp 98.2°F | Resp 16 | Ht 71.0 in | Wt 259.0 lb

## 2019-03-18 DIAGNOSIS — G5732 Lesion of lateral popliteal nerve, left lower limb: Secondary | ICD-10-CM | POA: Diagnosis not present

## 2019-03-18 DIAGNOSIS — S020XXS Fracture of vault of skull, sequela: Secondary | ICD-10-CM

## 2019-03-18 DIAGNOSIS — S329XXD Fracture of unspecified parts of lumbosacral spine and pelvis, subsequent encounter for fracture with routine healing: Secondary | ICD-10-CM

## 2019-03-18 DIAGNOSIS — S069X9S Unspecified intracranial injury with loss of consciousness of unspecified duration, sequela: Secondary | ICD-10-CM

## 2019-03-18 DIAGNOSIS — S32009S Unspecified fracture of unspecified lumbar vertebra, sequela: Secondary | ICD-10-CM | POA: Diagnosis not present

## 2019-03-18 DIAGNOSIS — G894 Chronic pain syndrome: Secondary | ICD-10-CM

## 2019-03-18 DIAGNOSIS — F1210 Cannabis abuse, uncomplicated: Secondary | ICD-10-CM | POA: Diagnosis present

## 2019-03-18 DIAGNOSIS — F1010 Alcohol abuse, uncomplicated: Secondary | ICD-10-CM | POA: Diagnosis present

## 2019-03-18 NOTE — Progress Notes (Signed)
Safety precautions to be maintained throughout the outpatient stay will include: orient to surroundings, keep bed in low position, maintain call bell within reach at all times, provide assistance with transfer out of bed and ambulation.  

## 2019-06-15 ENCOUNTER — Encounter (HOSPITAL_BASED_OUTPATIENT_CLINIC_OR_DEPARTMENT_OTHER): Payer: Self-pay

## 2019-06-15 ENCOUNTER — Emergency Department (HOSPITAL_BASED_OUTPATIENT_CLINIC_OR_DEPARTMENT_OTHER)
Admission: EM | Admit: 2019-06-15 | Discharge: 2019-06-15 | Disposition: A | Discharge status: Home / Self Care | Payer: Medicaid Other | Attending: Emergency Medicine | Admitting: Emergency Medicine

## 2019-06-15 ENCOUNTER — Other Ambulatory Visit: Payer: Self-pay

## 2019-06-15 ENCOUNTER — Emergency Department (HOSPITAL_BASED_OUTPATIENT_CLINIC_OR_DEPARTMENT_OTHER): Payer: Medicaid Other

## 2019-06-15 DIAGNOSIS — W010XXA Fall on same level from slipping, tripping and stumbling without subsequent striking against object, initial encounter: Secondary | ICD-10-CM | POA: Diagnosis not present

## 2019-06-15 DIAGNOSIS — J45909 Unspecified asthma, uncomplicated: Secondary | ICD-10-CM | POA: Diagnosis not present

## 2019-06-15 DIAGNOSIS — I10 Essential (primary) hypertension: Secondary | ICD-10-CM | POA: Insufficient documentation

## 2019-06-15 DIAGNOSIS — Z7982 Long term (current) use of aspirin: Secondary | ICD-10-CM | POA: Diagnosis not present

## 2019-06-15 DIAGNOSIS — Y929 Unspecified place or not applicable: Secondary | ICD-10-CM | POA: Diagnosis not present

## 2019-06-15 DIAGNOSIS — Y999 Unspecified external cause status: Secondary | ICD-10-CM | POA: Insufficient documentation

## 2019-06-15 DIAGNOSIS — Y9301 Activity, walking, marching and hiking: Secondary | ICD-10-CM | POA: Diagnosis not present

## 2019-06-15 DIAGNOSIS — E119 Type 2 diabetes mellitus without complications: Secondary | ICD-10-CM | POA: Diagnosis not present

## 2019-06-15 DIAGNOSIS — Z7984 Long term (current) use of oral hypoglycemic drugs: Secondary | ICD-10-CM | POA: Insufficient documentation

## 2019-06-15 DIAGNOSIS — F17210 Nicotine dependence, cigarettes, uncomplicated: Secondary | ICD-10-CM | POA: Insufficient documentation

## 2019-06-15 DIAGNOSIS — S8991XA Unspecified injury of right lower leg, initial encounter: Secondary | ICD-10-CM | POA: Diagnosis present

## 2019-06-15 DIAGNOSIS — S82831A Other fracture of upper and lower end of right fibula, initial encounter for closed fracture: Secondary | ICD-10-CM | POA: Diagnosis not present

## 2019-06-15 DIAGNOSIS — Z79899 Other long term (current) drug therapy: Secondary | ICD-10-CM | POA: Diagnosis not present

## 2019-06-15 MED ORDER — OXYCODONE-ACETAMINOPHEN 5-325 MG PO TABS: 1.0000 | ORAL_TABLET | Freq: Four times a day (QID) | ORAL | 0 refills | Status: AC | PRN

## 2019-06-15 MED ORDER — OXYCODONE-ACETAMINOPHEN 5-325 MG PO TABS
1.0000 | ORAL_TABLET | Freq: Once | ORAL | Status: AC
  Administered 2019-06-15: 1 via ORAL
  Filled 2019-06-15: qty 1

## 2019-06-15 NOTE — Discharge Instructions (Signed)
Thank you for allowing me to care for you today. Please return to the emergency department if you have new or worsening symptoms. Take your medications as instructed.  ° °
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
## 2019-06-15 NOTE — ED Triage Notes (Signed)
Pt states he was outside and fell on level ground into mulch. Pt went inside and when he came back out his R ankle gave away and he fell off the porch onto concrete. Swelling noted to R ankle, Pt  c/o pain in L groin and leg, R ankle, R wrist.

## 2019-06-15 NOTE — ED Notes (Signed)
Pt unstable on crutches, walker at home , pa aware , crutches discont

## 2019-06-15 NOTE — ED Notes (Signed)
Instructed pt that ride is needed for narcotics

## 2020-03-09 ENCOUNTER — Encounter (HOSPITAL_COMMUNITY): Payer: Self-pay | Admitting: *Deleted

## 2020-03-09 ENCOUNTER — Other Ambulatory Visit: Payer: Self-pay

## 2020-03-09 ENCOUNTER — Emergency Department (HOSPITAL_COMMUNITY)
Admission: EM | Admit: 2020-03-09 | Discharge: 2020-03-09 | Disposition: A | Discharge status: Home / Self Care | Payer: Medicaid Other | Attending: Emergency Medicine | Admitting: Emergency Medicine

## 2020-03-09 ENCOUNTER — Emergency Department (HOSPITAL_COMMUNITY): Payer: Medicaid Other

## 2020-03-09 DIAGNOSIS — Y999 Unspecified external cause status: Secondary | ICD-10-CM | POA: Insufficient documentation

## 2020-03-09 DIAGNOSIS — Z7984 Long term (current) use of oral hypoglycemic drugs: Secondary | ICD-10-CM | POA: Diagnosis not present

## 2020-03-09 DIAGNOSIS — M545 Low back pain, unspecified: Secondary | ICD-10-CM

## 2020-03-09 DIAGNOSIS — Z79899 Other long term (current) drug therapy: Secondary | ICD-10-CM | POA: Insufficient documentation

## 2020-03-09 DIAGNOSIS — M25552 Pain in left hip: Secondary | ICD-10-CM | POA: Diagnosis not present

## 2020-03-09 DIAGNOSIS — Y92410 Unspecified street and highway as the place of occurrence of the external cause: Secondary | ICD-10-CM | POA: Diagnosis not present

## 2020-03-09 DIAGNOSIS — Y9389 Activity, other specified: Secondary | ICD-10-CM | POA: Diagnosis not present

## 2020-03-09 DIAGNOSIS — I10 Essential (primary) hypertension: Secondary | ICD-10-CM | POA: Insufficient documentation

## 2020-03-09 DIAGNOSIS — Z7982 Long term (current) use of aspirin: Secondary | ICD-10-CM | POA: Diagnosis not present

## 2020-03-09 DIAGNOSIS — E119 Type 2 diabetes mellitus without complications: Secondary | ICD-10-CM | POA: Diagnosis not present

## 2020-03-09 DIAGNOSIS — F17210 Nicotine dependence, cigarettes, uncomplicated: Secondary | ICD-10-CM | POA: Diagnosis not present

## 2020-03-09 DIAGNOSIS — R079 Chest pain, unspecified: Secondary | ICD-10-CM | POA: Diagnosis not present

## 2020-03-09 LAB — COMPREHENSIVE METABOLIC PANEL
ALT: 16 U/L (ref 0–44)
AST: 25 U/L (ref 15–41)
Albumin: 3.7 g/dL (ref 3.5–5.0)
Alkaline Phosphatase: 69 U/L (ref 38–126)
Anion gap: 10 (ref 5–15)
BUN: 8 mg/dL (ref 6–20)
CO2: 27 mmol/L (ref 22–32)
Calcium: 8.9 mg/dL (ref 8.9–10.3)
Chloride: 104 mmol/L (ref 98–111)
Creatinine, Ser: 1.17 mg/dL (ref 0.61–1.24)
GFR calc Af Amer: >60 mL/min (ref >60)
GFR calc non Af Amer: >60 mL/min (ref >60)
Glucose, Bld: 85 mg/dL (ref 70–99)
Potassium: 3.8 mmol/L (ref 3.5–5.1)
Sodium: 141 mmol/L (ref 135–145)
Total Bilirubin: 1.2 mg/dL (ref 0.3–1.2)
Total Protein: 7.8 g/dL (ref 6.5–8.1)

## 2020-03-09 LAB — CBC
HCT: 46.9 % (ref 39.0–52.0)
Hemoglobin: 14.9 g/dL (ref 13.0–17.0)
MCH: 30.7 pg (ref 26.0–34.0)
MCHC: 31.8 g/dL (ref 30.0–36.0)
MCV: 96.5 fL (ref 80.0–100.0)
Platelets: 208 10*3/uL (ref 150–400)
RBC: 4.86 MIL/uL (ref 4.22–5.81)
RDW: 15.4 % (ref 11.5–15.5)
WBC: 9.3 10*3/uL (ref 4.0–10.5)
nRBC: 0 % (ref 0.0–0.2)

## 2020-03-09 MED ORDER — SODIUM CHLORIDE 0.9% FLUSH: 3.0000 mL | Freq: Once | INTRAVENOUS | Status: DC

## 2020-03-09 NOTE — ED Notes (Signed)
Pt states he was having CP at dc time, ED provider ordered EKG, provider recommended for pt to stay and be evaluated for the cp, pt refuses to stay and sign AMA.
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
## 2020-03-10 ENCOUNTER — Ambulatory Visit: Payer: Medicaid Other | Admitting: Podiatry

## 2020-03-10 DIAGNOSIS — M79675 Pain in left toe(s): Secondary | ICD-10-CM

## 2020-03-10 DIAGNOSIS — M79674 Pain in right toe(s): Secondary | ICD-10-CM

## 2020-03-10 DIAGNOSIS — E119 Type 2 diabetes mellitus without complications: Secondary | ICD-10-CM

## 2020-03-10 DIAGNOSIS — Z794 Long term (current) use of insulin: Secondary | ICD-10-CM

## 2020-03-10 DIAGNOSIS — B351 Tinea unguium: Secondary | ICD-10-CM

## 2020-03-12 ENCOUNTER — Encounter: Payer: Self-pay | Admitting: Podiatry

## 2020-03-12 NOTE — Progress Notes (Signed)
°  Subjective:  Patient ID: [facility], male    DOB: [birth year],  MRN: [id number]  Chief Complaint  Patient presents with   Nail Problem    pt is here for a nail trim.   59 y.o. male returns for the above complaint.  Patient presents with thickened elongated dystrophic toenails x10.  Patient is a type II diabetic.  Patient would like to know if they could be debrided down as he is not able to do it himself.  He denies any other acute complaints.  The sugars are not controlled.  He states his last A1c was 7.0.  He denies any other acute complaints.  They are painful to touch.  Pain is 7 out of 10.  Objective:  There were no vitals filed for this visit. Podiatric Exam: Vascular: dorsalis pedis and posterior tibial pulses are palpable bilateral. Capillary return is immediate. Temperature gradient is WNL. Skin turgor WNL  Sensorium: Normal Semmes Weinstein monofilament test. Normal tactile sensation bilaterally. Nail Exam: Pt has thick disfigured discolored nails with subungual debris noted bilateral entire nail hallux through fifth toenails.  Pain on palpation to the nails. Ulcer Exam: There is no evidence of ulcer or pre-ulcerative changes or infection. Orthopedic Exam: Muscle tone and strength are WNL. No limitations in general ROM. No crepitus or effusions noted. HAV  B/L.  Hammer toes 2-5  B/L. Skin: No Porokeratosis. No infection or ulcers    Assessment & Plan:   1. Pain due to onychomycosis of toenails of both feet   2. Type 2 diabetes mellitus without complication, with long-term current use of insulin ([place])     Patient was evaluated and treated and all questions answered.  Onychomycosis with pain  -Nails palliatively debrided as below. -Educated on self-care  Procedure: Nail Debridement Rationale: pain  Type of Debridement: manual, sharp debridement. Instrumentation: Nail nipper, rotary burr. Number of Nails: 10  Procedures and Treatment: Consent by patient was  obtained for treatment procedures. The patient understood the discussion of treatment and procedures well. All questions were answered thoroughly reviewed. Debridement of mycotic and hypertrophic toenails, 1 through 5 bilateral and clearing of subungual debris. No ulceration, no infection noted.  Return Visit-Office Procedure: Patient instructed to return to the office for a follow up visit 3 months for continued evaluation and treatment.  Boneta Lucks, DPM    No follow-ups on file.

## 2020-06-22 ENCOUNTER — Ambulatory Visit
Admission: RE | Admit: 2020-06-22 | Discharge: 2020-06-22 | Disposition: A | Discharge status: Home / Self Care | Payer: Medicaid Other | Source: Ambulatory Visit | Attending: Family Medicine | Admitting: Family Medicine

## 2020-06-22 ENCOUNTER — Other Ambulatory Visit: Payer: Self-pay | Admitting: Family Medicine

## 2020-06-22 ENCOUNTER — Other Ambulatory Visit: Payer: Self-pay

## 2020-06-22 DIAGNOSIS — Z01818 Encounter for other preprocedural examination: Secondary | ICD-10-CM

## 2020-06-23 ENCOUNTER — Encounter: Payer: Self-pay | Admitting: Podiatry

## 2020-06-23 ENCOUNTER — Ambulatory Visit (INDEPENDENT_AMBULATORY_CARE_PROVIDER_SITE_OTHER): Payer: Medicaid Other | Admitting: Podiatry

## 2020-06-23 DIAGNOSIS — M79674 Pain in right toe(s): Secondary | ICD-10-CM

## 2020-06-23 DIAGNOSIS — Z794 Long term (current) use of insulin: Secondary | ICD-10-CM

## 2020-06-23 DIAGNOSIS — B351 Tinea unguium: Secondary | ICD-10-CM

## 2020-06-23 DIAGNOSIS — M792 Neuralgia and neuritis, unspecified: Secondary | ICD-10-CM

## 2020-06-23 DIAGNOSIS — M79675 Pain in left toe(s): Secondary | ICD-10-CM | POA: Diagnosis not present

## 2020-06-23 DIAGNOSIS — E119 Type 2 diabetes mellitus without complications: Secondary | ICD-10-CM | POA: Diagnosis not present

## 2020-06-23 NOTE — Progress Notes (Signed)
  Subjective:  Patient ID: [facility], male    DOB: 01-01-1961,  MRN: [id number]  Chief Complaint  Patient presents with  . routine foot care    PT is doing okay    59 y.o. male returns for the above complaint.  Patient presents with thickened elongated dystrophic toenails x10.  Patient is a type II diabetic.  Patient would like to know if they could be debrided down as he is not able to do it himself.  He denies any other acute complaints.  The sugars are not controlled.  He states his last A1c was 7.0.  He denies any other acute complaints.  They are painful to touch.  Pain is 7 out of 10.  He also has secondary complaint of neuropathy/nerve pain to bilateral lower extremities/distal tips of the toes.  He states that sugar has been trying to be controlled however the neuropathy pain has been unbearable.  He denies any other acute complaints.  He denies taking any medication for it except gabapentin.  Objective:  There were no vitals filed for this visit. Podiatric Exam: Vascular: dorsalis pedis and posterior tibial pulses are palpable bilateral. Capillary return is immediate. Temperature gradient is WNL. Skin turgor WNL  Sensorium: Decreased Semmes Weinstein monofilament test.  Decreased tactile sensation bilaterally. Nail Exam: Pt has thick disfigured discolored nails with subungual debris noted bilateral entire nail hallux through fifth toenails.  Pain on palpation to the nails. Ulcer Exam: There is no evidence of ulcer or pre-ulcerative changes or infection. Orthopedic Exam: Muscle tone and strength are WNL. No limitations in general ROM. No crepitus or effusions noted. HAV  B/L.  Hammer toes 2-5  B/L. Skin: No Porokeratosis. No infection or ulcers    Assessment & Plan:   1. Type 2 diabetes mellitus without complication, with long-term current use of insulin ([facility])   2. Pain due to onychomycosis of toenails of both feet   3. Neuropathic pain     Patient was evaluated and  treated and all questions answered.  Neuropathy bilateral distal toes -I explained to patient the etiology of neuropathy and various treatment options were extensively discussed with the patient.  I discussed with him the importance of shoe gear modification as well as sugar control/glucose management given that his last A1c was 7.0 educated him on diet control to help lower the A1c and therefore can help with the numbness/tingling.  Patient states understanding he will work on diet control.  Onychomycosis with pain  -Nails palliatively debrided as below. -Educated on self-care  Procedure: Nail Debridement Rationale: pain  Type of Debridement: manual, sharp debridement. Instrumentation: Nail nipper, rotary burr. Number of Nails: 10  Procedures and Treatment: Consent by patient was obtained for treatment procedures. The patient understood the discussion of treatment and procedures well. All questions were answered thoroughly reviewed. Debridement of mycotic and hypertrophic toenails, 1 through 5 bilateral and clearing of subungual debris. No ulceration, no infection noted.  Return Visit-Office Procedure: Patient instructed to return to the office for a follow up visit 3 months for continued evaluation and treatment.  Boneta Lucks, DPM    No follow-ups on file.
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
## 2020-07-03 ENCOUNTER — Telehealth: Payer: Self-pay | Admitting: Cardiovascular Disease

## 2020-07-03 ENCOUNTER — Ambulatory Visit (INDEPENDENT_AMBULATORY_CARE_PROVIDER_SITE_OTHER): Payer: Medicaid Other | Admitting: Cardiovascular Disease

## 2020-07-03 ENCOUNTER — Encounter: Payer: Self-pay | Admitting: Cardiovascular Disease

## 2020-07-03 ENCOUNTER — Other Ambulatory Visit: Payer: Self-pay

## 2020-07-03 VITALS — BP 184/90 | HR 67 | Ht 72.0 in | Wt 290.4 lb

## 2020-07-03 DIAGNOSIS — I517 Cardiomegaly: Secondary | ICD-10-CM | POA: Diagnosis not present

## 2020-07-03 DIAGNOSIS — R9431 Abnormal electrocardiogram [ECG] [EKG]: Secondary | ICD-10-CM

## 2020-07-03 DIAGNOSIS — Z01810 Encounter for preprocedural cardiovascular examination: Secondary | ICD-10-CM

## 2020-07-03 DIAGNOSIS — I10 Essential (primary) hypertension: Secondary | ICD-10-CM

## 2020-07-03 DIAGNOSIS — E669 Obesity, unspecified: Secondary | ICD-10-CM

## 2020-07-03 DIAGNOSIS — R0683 Snoring: Secondary | ICD-10-CM

## 2020-07-03 MED ORDER — CARVEDILOL 25 MG PO TABS: 25.0000 mg | ORAL_TABLET | Freq: Two times a day (BID) | ORAL | 3 refills | Status: DC

## 2020-07-03 NOTE — Patient Instructions (Addendum)
Medication Instructions:  Increase your carvedilol to 25 mg twice a day *If you need a refill on your cardiac medications before your next appointment, please call your pharmacy*   Lab Work: None ordered If you have labs (blood work) drawn today and your tests are completely normal, you will receive your results only by:  [place] (if you have MyChart) OR  A paper copy in the mail If you have any lab test that is abnormal or we need to change your treatment, we will call you to review the results.   Testing/Procedures: Your physician has recommended that you have a home sleep study. This test records several body functions during sleep, including: brain activity, eye movement, oxygen and carbon dioxide blood levels, heart rate and rhythm, breathing rate and rhythm, the flow of air through your mouth and nose, snoring, body muscle movements, and chest and belly movement. Our sleep coordinator will reach out to you with more information.  Your physician has requested that you have an echocardiogram. Echocardiography is a painless test that uses sound waves to create images of your heart. It provides your doctor with information about the size and shape of your heart and how well your hearts chambers and valves are working. This procedure takes approximately one hour. There are no restrictions for this procedure. This is done at [address].   Follow-Up: At [facility], you and your health needs are our priority.  As part of our continuing mission to provide you with exceptional heart care, we have created designated Provider Care Teams.  These Care Teams include your primary Cardiologist (physician) and Advanced Practice Providers (APPs -  Physician Assistants and Nurse Practitioners) who all work together to provide you with the care you need, when you need it.  We recommend signing up for the patient portal called "MyChart".  Sign up information is provided on this After  Visit Summary.  MyChart is used to connect with patients for Virtual Visits (Telemedicine).  Patients are able to view lab/test results, encounter notes, upcoming appointments, etc.  Non-urgent messages can be sent to your provider as well.   To learn more about what you can do with MyChart, go to [email or web address].    Your next appointment:   1 month(s)  The format for your next appointment:   In Person  Provider:   Eleonore Chiquito, MD   Other Instructions None

## 2020-07-03 NOTE — Telephone Encounter (Signed)
New message:     Patient states some one called him. I didn't;t see a note. Please call patient back.

## 2020-07-04 ENCOUNTER — Telehealth: Payer: Self-pay | Admitting: *Deleted

## 2020-07-04 NOTE — Telephone Encounter (Signed)
-----   Message from Arnetha Courser, RN sent at 07/03/2020 12:55 PM EDT ----- Regarding: Home Sleep Study Ordered Hello all,  Dr. Audie Box has ordered a home sleep study for this pt. Please let me know if there is anything I need to do to facilitate this at this point.  Thank you,  Judson Roch, RN

## 2020-07-05 ENCOUNTER — Telehealth: Payer: Self-pay | Admitting: Cardiovascular Disease

## 2020-07-05 DIAGNOSIS — I517 Cardiomegaly: Secondary | ICD-10-CM

## 2020-07-05 MED ORDER — CARVEDILOL 25 MG PO TABS: 25.0000 mg | ORAL_TABLET | Freq: Two times a day (BID) | ORAL | 3 refills | Status: DC

## 2020-07-05 NOTE — Telephone Encounter (Signed)
Called to schedule the Echo that was ordered by Dr. O'Neal----pt states Dr. Audie Box ordered a new medication for him and he has gone to the pharmacy several times and it is not there.  Can we check on this please?

## 2020-07-05 NOTE — Telephone Encounter (Signed)
Spoke with patient who states that he has not been able to pick up his prescription for the 25mg  of Carvedilol. Patient states he uses [facility] on [address]. Prescription was sent to [facility]. Advised patient that prescription has been refilled and sent into [facility] on [address] Durward Fortes patient to let us know if there are any more issues. Patient verbalized understanding.

## 2020-07-05 NOTE — Telephone Encounter (Signed)
Authorization sent to [facility], Tracking ID # [id number]

## 2020-07-20 ENCOUNTER — Telehealth: Payer: Self-pay | Admitting: Cardiovascular Disease

## 2020-07-20 NOTE — Telephone Encounter (Signed)
11/04: Auth denied, appeal ID is [id number]

## 2020-07-21 ENCOUNTER — Ambulatory Visit (HOSPITAL_COMMUNITY): Payer: Medicaid Other | Attending: Cardiology

## 2020-07-21 ENCOUNTER — Other Ambulatory Visit: Payer: Self-pay

## 2020-07-21 DIAGNOSIS — R9431 Abnormal electrocardiogram [ECG] [EKG]: Secondary | ICD-10-CM | POA: Diagnosis not present

## 2020-07-21 LAB — ECHOCARDIOGRAM COMPLETE
Area-P 1/2: 3.53 cm2
S' Lateral: 2.4 cm

## 2020-07-21 MED ORDER — PERFLUTREN LIPID MICROSPHERE
1.0000 mL | INTRAVENOUS | Status: AC | PRN
  Administered 2020-07-21: 1 mL via INTRAVENOUS

## 2020-07-24 NOTE — Telephone Encounter (Signed)
Cicely from [employer] calling back with the fax number. Fax: [phone]

## 2020-07-24 NOTE — Telephone Encounter (Signed)
[employer] called in and stated they are require Dr office to reach out to patient for consent for the appeal.  That is the only they are waiting on in order to process the appeal .  They will need a letter from the provider stating to appeal the sleep study on the members behalf , it needs to include on the letter the DOD and case# [id number].  They will need the consent by the 18th    Best number for Miguel Brady

## 2020-07-26 NOTE — Telephone Encounter (Signed)
    Loren - [employer] calling she said she needs more medical notes to support the appeal for the pt. She said if the can call her tomorrow so she can explain what they need.

## 2020-07-27 NOTE — Telephone Encounter (Signed)
07/27/20 9:28am Faxed Epworth Sleepiness Scale to [phone].

## 2020-08-03 ENCOUNTER — Encounter: Payer: Self-pay | Admitting: Cardiovascular Disease

## 2020-08-03 ENCOUNTER — Ambulatory Visit (INDEPENDENT_AMBULATORY_CARE_PROVIDER_SITE_OTHER): Payer: Medicaid Other | Admitting: Cardiovascular Disease

## 2020-08-03 ENCOUNTER — Other Ambulatory Visit: Payer: Self-pay

## 2020-08-03 VITALS — BP 174/102 | HR 80 | Ht 72.0 in | Wt 295.8 lb

## 2020-08-03 DIAGNOSIS — R0683 Snoring: Secondary | ICD-10-CM

## 2020-08-03 DIAGNOSIS — E669 Obesity, unspecified: Secondary | ICD-10-CM

## 2020-08-03 DIAGNOSIS — I5032 Chronic diastolic (congestive) heart failure: Secondary | ICD-10-CM

## 2020-08-03 DIAGNOSIS — I517 Cardiomegaly: Secondary | ICD-10-CM | POA: Diagnosis not present

## 2020-08-03 DIAGNOSIS — I10 Essential (primary) hypertension: Secondary | ICD-10-CM | POA: Diagnosis not present

## 2020-08-03 MED ORDER — SPIRONOLACTONE 25 MG PO TABS
25.0000 mg | ORAL_TABLET | Freq: Every day | ORAL | 3 refills | Status: DC
Start: 2020-08-03 — End: 2020-08-09

## 2020-08-03 NOTE — Progress Notes (Signed)
Cardiology Office Note:   Date:  08/03/2020  NAME:  Miguel Brady    MRN: [id number] DOB:  December 04, 1960   PCP:  Lujean Amel, MD  Cardiologist:  No primary care provider on file.   Referring MD: Lujean Amel, MD   Chief Complaint  Patient presents with   Hypertension   History of Present Illness:   Miguel Brady is a 59 y.o. male with a hx of diabetes, HTN, obesity who presents for follow-up. Seen last month for preop assessment. Need to get BP under better control. Sleep study pending.  His blood pressure is elevated today 174/102.  He reports he took his medications today.  He does report that he misses his medications frequently.  We are waiting for a sleep study.  He has not undergone his surgery.  His echocardiogram demonstrates grade 2 diastolic dysfunction.  We did spend a good amount of time going over the fact that his blood pressure and obesity are affecting his heart.  He is more likely to hold onto fluid.  He does need to get serious about his diet as well as blood pressure control.  He is on lisinopril 40 mg daily, amlodipine 10 mg daily, carvedilol 25 mg twice daily.  He also takes Lasix 40 mg daily.  Blood pressure still not at goal.  I do have suspicion that noncompliance is a major issue.  Inactivity is also an issue and poor diet.  His most recent labs from his PCP demonstrate an A1c of 6.2, total cholesterol 139, HDL 63, LDL 59.  He reports he is not that active but when he is active he does get short of breath.  His Lasix does improve his lower extremity edema.  Problem List 1. DM -A1c 6.2 2. HTN 3. HLD -Total cholesterol 139, HDL 63, LDL 59, triglycerides 94 4.  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  no inhaler   Substance abuse ([place])    ETOH   TOBACCO ABUSE 12/23/2008   TRIGGER FINGER 05/05/2007   Tuberculosis    pos PPD    Past Surgical History: Past Surgical History:  Procedure Laterality Date   CHEST TUBE INSERTION  04/03/2012   Procedure: CHEST TUBE INSERTION;  Surgeon: Zenovia Jarred, MD;  Location: [place];  Service: General;  Laterality: Left;   CHOLECYSTECTOMY  07/28/2012   Procedure: LAPAROSCOPIC CHOLECYSTECTOMY;  Surgeon: Harl Bowie, MD;  Location: [facility];  Service: General;  Laterality: N/A;   EXTERNAL FIXATION LEG  04/03/2012   Procedure: EXTERNAL FIXATION LEG;  Surgeon: Rozanna Box, MD;  Location: [place];  Service: Orthopedics;  Laterality: Left;  Left femur   EXTERNAL FIXATION PELVIS  04/03/2012   Procedure: EXTERNAL FIXATION PELVIS;  Surgeon: Rozanna Box, MD;  Location: [place];  Service: Orthopedics;;   FEMUR IM NAIL  04/07/2012   Procedure: INTRAMEDULLARY (IM) NAIL FEMORAL;  Surgeon: Rozanna Box, MD;  Location: [place];  Service: Orthopedics;  Laterality: Left;   FLEXIBLE BRONCHOSCOPY  04/07/2012   Procedure: FLEXIBLE BRONCHOSCOPY;  Surgeon: Zenovia Jarred, MD;  Location: [place];  Service: General;;  START TIME=1645 END TIME=1700   INCISION AND DRAINAGE OF WOUND  04/03/2012   Procedure: IRRIGATION AND DEBRIDEMENT WOUND;  Surgeon: Otilio Connors, MD;  Location: [place];  Service: Neurosurgery;  Laterality: N/A;  Frontal.   ORIF PATELLA  04/07/2012   Procedure: OPEN REDUCTION INTERNAL (ORIF) FIXATION PATELLA;  Surgeon: Rozanna Box, MD;  Location: [facility];  Service: Orthopedics;  Laterality: Left;   ORIF PELVIC FRACTURE  04/07/2012   Procedure: OPEN REDUCTION INTERNAL FIXATION (ORIF) PELVIC FRACTURE;  Surgeon: Rozanna Box, MD;  Location: [facility];  Service: Orthopedics;  Laterality: N/A;  Right and left sacroiliac screw  pinning,Irrigation and debridebridement open tibia and femur,removal external fixator.   TIBIA IM NAIL INSERTION  04/07/2012   Procedure: INTRAMEDULLARY (IM) NAIL TIBIAL;  Surgeon: Rozanna Box, MD;  Location: [place];  Service: Orthopedics;  Laterality: Left;    Current Medications: Current Meds  Medication Sig   amLODipine (NORVASC) 10 MG tablet Take 10 mg by mouth at bedtime.    B-D 3CC LUER-LOK SYR 22GX1" 22G X 1" 3 ML MISC See admin instructions.   carvedilol (COREG) 25 MG tablet Take 1 tablet (25 mg total) by mouth 2 (two) times daily.   chlorzoxazone (PARAFON) 500 MG tablet Take by mouth 4 (four) times daily as needed for muscle spasms.   cholecalciferol (VITAMIN D) 1000 UNITS tablet Take 1,000 Units by mouth at bedtime.    cyclobenzaprine (FLEXERIL) 10 MG tablet Take 1 tablet (10 mg total) by mouth 3 (three) times daily as needed for muscle spasms.   diphenhydrAMINE (SOMINEX) 25 MG tablet Take 25 mg by mouth daily as needed for allergies or sleep.   furosemide (LASIX) 40 MG tablet Take 40 mg by mouth at bedtime.   gabapentin (NEURONTIN) 300 MG capsule Take 900 mg by mouth 3 (three) times daily.   GRALISE 600 MG TABS Take 600-1,200 mg by mouth at bedtime.    lisinopril (ZESTRIL) 40 MG tablet Take 40 mg by mouth daily.   metFORMIN (GLUCOPHAGE) 1000 MG tablet Take 1,000 mg by mouth at bedtime.   methocarbamol (ROBAXIN) 500 MG tablet Take 500 mg by mouth 4 (four) times daily.   naproxen (NAPROSYN) 500 MG tablet Take 1 tablet (500 mg total) by mouth 2 (two) times daily as needed for mild pain, moderate pain or headache (TAKE WITH MEALS.).   ofloxacin (OCUFLOX) 0.3 % ophthalmic solution    oxyCODONE-acetaminophen (PERCOCET) 10-325 MG tablet Take 1 tablet by mouth every 6 (six) hours as needed.   pantoprazole (PROTONIX) 40 MG tablet Take 40 mg by mouth at bedtime.    potassium chloride (K-DUR,KLOR-CON) 10 MEQ tablet Take 10 mEq by mouth at bedtime.    pravastatin  (PRAVACHOL) 20 MG tablet Take 20 mg by mouth at bedtime.   sildenafil (VIAGRA) 100 MG tablet Take 100 mg by mouth daily as needed for erectile dysfunction.   testosterone cypionate (DEPOTESTOSTERONE CYPIONATE) 200 MG/ML injection Inject 1 mL into the muscle every 28 (twenty-eight) days.     Allergies:    Shellfish allergy, Iodine, and Penicillins   Social History: Social History   Socioeconomic History   Marital status: Single    Spouse name: Not on file   Number of children: 0   Years of education: college   Highest education level: Not on file  Occupational History   Occupation: disabled    Comment: disabled  Tobacco Use  Smoking status: Former Smoker    Packs/day: 0.50    Years: 30.00    Pack years: 15.00    Types: Cigarettes    Start date: 06/25/2020   Smokeless tobacco: Never Used  Vaping Use   Vaping Use: Never used  Substance and Sexual Activity   Alcohol use: Yes    Alcohol/week: 0.0 standard drinks    Comment: occ   Drug use: No    Comment: pt denies marijuana use for couple of years   Sexual activity: Not on file  Other Topics Concern   Not on file  Social History Narrative   Patient lives at home alone. Patient is single.   Disabled.   Education college.   Left handed.   Caffeine one soda daily.   Social Determinants of Health   Financial Resource Strain:    Difficulty of Paying Living Expenses: Not on file  Food Insecurity:    Worried About Charity fundraiser in the Last Year: Not on file   [employer] of Food in the Last Year: Not on file  Transportation Needs:    Lack of Transportation (Medical): Not on file   Lack of Transportation (Non-Medical): Not on file  Physical Activity:    Days of Exercise per Week: Not on file   Minutes of Exercise per Session: Not on file  Stress:    Feeling of Stress : Not on file  Social Connections:    Frequency of Communication with Friends and Family: Not on file   Frequency of Social  Gatherings with Friends and Family: Not on file   Attends Religious Services: Not on file   Active Member of Clubs or Organizations: Not on file   Attends Archivist Meetings: Not on file   Marital Status: Not on file     Family History: The patient's family history includes Cancer in his father; Colon cancer in his father and paternal uncle; Heart disease in his mother. There is no history of Esophageal cancer, Rectal cancer, or Stomach cancer.  ROS:   All other ROS reviewed and negative. Pertinent positives noted in the HPI.     EKGs/Labs/Other Studies Reviewed:   The following studies were personally reviewed by me today:  TTE 07/21/2020 1. Left ventricular ejection fraction, by estimation, is 55 to 60%. The  left ventricle has normal function. The left ventricle has no regional  wall motion abnormalities. There is moderate concentric left ventricular  hypertrophy. Left ventricular  diastolic parameters are consistent with Grade II diastolic dysfunction  (pseudonormalization). Elevated left atrial pressure. The E/e' is 18.1.  2. Right ventricular systolic function is normal. The right ventricular  size is normal.  3. The mitral valve is normal in structure. Trivial mitral valve  regurgitation.  4. The aortic valve is tricuspid. Aortic valve regurgitation is not  visualized.  5. The inferior vena cava is dilated in size with >50% respiratory  variability, suggesting right atrial pressure of 8 mmHg.   Recent Labs: 03/09/2020: ALT 16; BUN 8; Creatinine, Ser 1.17; Hemoglobin 14.9; Platelets 208; Potassium 3.8; Sodium 141   Recent Lipid Panel    Component Value Date/Time   CHOL 89 01/24/2016 0517   TRIG 57 01/24/2016 0517   HDL 29 (L) 01/24/2016 0517   CHOLHDL 3.1 01/24/2016 0517   VLDL 11 01/24/2016 0517   LDLCALC 49 01/24/2016 0517    Physical Exam:   VS:  BP (!) 174/102 (BP Location: Left Arm, Patient Position: Sitting)  Pulse 80    Ht 6' (1.829 m)     Wt 295 lb 12.8 oz (134.2 kg)    SpO2 95%    BMI 40.12 kg/m    Wt Readings from Last 3 Encounters:  08/03/20 295 lb 12.8 oz (134.2 kg)  07/03/20 290 lb 6.4 oz (131.7 kg)  03/09/20 280 lb (127 kg)    General: Well nourished, well developed, in no acute distress Heart: Atraumatic, normal size  Eyes: PEERLA, EOMI  Neck: Supple, no JVD Endocrine: No thryomegaly Cardiac: Normal S1, S2; RRR; no murmurs, rubs, or gallops Lungs: Clear to auscultation bilaterally, no wheezing, rhonchi or rales  Abd: Soft, nontender, no hepatomegaly  Ext: trace edema  Musculoskeletal: No deformities, BUE and BLE strength normal and equal Skin: Warm and dry, no rashes   Neuro: Alert and oriented to person, place, time, and situation, CNII-XII grossly intact, no focal deficits  Psych: Normal mood and affect   ASSESSMENT:   JERNARD REIBER is a 59 y.o. male who presents for the following: 1. Chronic diastolic heart failure ([place])   2. LVH (left ventricular hypertrophy)   3. Primary hypertension   4. Obesity (BMI 30-39.9)   5. Snoring     PLAN:   1. Chronic diastolic heart failure ([place]) 2. LVH (left ventricular hypertrophy) -EF 55-60% with grade 2 diastolic dysfunction.  He does have trace edema on exam.  We spent an extensive amount of time going over the fact that his blood pressure and obesity are now affecting his heart.  He does need to watch his salt intake.  He also needs to lose weight.  Blood pressure severely uncontrolled.  I recommended referral to the hypertension clinic.  I think they may have some resources that will be good for him such as gym membership and more education on salt reduction strategies.  He will continue his Lasix for now.  3. Primary hypertension -Blood pressure 174/102.  Not controlled.  Continue Norvasc 10 mg daily, Coreg 25 mg twice a day, lisinopril 40 mg daily, lisinopril 40 mg daily.  We will add Aldactone 25 mg daily. -I have recommended salt reductive strategies. -I  have also recommended referral to the hypertension clinic.  I would like for him to be evaluated by Dr. Skeet Latch.  I think it would be good for him to work with this clinic as they can focus on gym membership's and other ways that he can control his blood pressure.  I do suspect noncompliance is a major issue but hopefully they can work with him on this as well.  4. Obesity (BMI 30-39.9) 5. Snoring -Home sleep study was ordered.  We will reach back out to see where we are in the process of this.  I suspect sleep apnea is also contributing to his uncontrolled hypertension.  Disposition: Return in about 6 months (around 01/31/2021).  Medication Adjustments/Labs and Tests Ordered: Current medicines are reviewed at length with the patient today.  Concerns regarding medicines are outlined above.  Orders Placed This Encounter  Procedures   AMB REFERRAL TO [facility]ED [facility]N CLINIC   Meds ordered this encounter  Medications   spironolactone (ALDACTONE) 25 MG tablet    Sig: Take 1 tablet (25 mg total) by mouth daily.    Dispense:  90 tablet    Refill:  3    Patient Instructions  Medication Instructions:  Start Spironolactone 25 mg daily   *If you need a refill on your cardiac medications before your next appointment,  please call your pharmacy*   Lab Work: None   Testing/Procedures: None   Follow-Up: At [facility], you and your health needs are our priority.  As part of our continuing mission to provide you with exceptional heart care, we have created designated Provider Care Teams.  These Care Teams include your primary Cardiologist (physician) and Advanced Practice Providers (APPs -  Physician Assistants and Nurse Practitioners) who all work together to provide you with the care you need, when you need it.  We recommend signing up for the patient portal called "MyChart".  Sign up information is provided on this After Visit Summary.  MyChart is used to connect with patients for  Virtual Visits (Telemedicine).  Patients are able to view lab/test results, encounter notes, upcoming appointments, etc.  Non-urgent messages can be sent to your provider as well.   To learn more about what you can do with MyChart, go to [email or web address].    Your next appointment:   6 month(s)  The format for your next appointment:   In Person  Provider:   Eleonore Chiquito, MD  Your physician recommends that you schedule a follow-up appointment with Dr. Oval Linsey hypertension clinic.    Time Spent with Patient: I have spent a total of 25 minutes with patient reviewing hospital notes, telemetry, EKGs, labs and examining the patient as well as establishing an assessment and plan that was discussed with the patient.  > 50% of time was spent in direct patient care.  Signed, Addison Naegeli. Audie Box, [place]  [address], [place], [place] [address] [phone]  08/03/2020 2:54 PM

## 2020-08-03 NOTE — Patient Instructions (Signed)
Medication Instructions:  Start Spironolactone 25 mg daily   *If you need a refill on your cardiac medications before your next appointment, please call your pharmacy*   Lab Work: None   Testing/Procedures: None   Follow-Up: At [facility], you and your health needs are our priority.  As part of our continuing mission to provide you with exceptional heart care, we have created designated Provider Care Teams.  These Care Teams include your primary Cardiologist (physician) and Advanced Practice Providers (APPs -  Physician Assistants and Nurse Practitioners) who all work together to provide you with the care you need, when you need it.  We recommend signing up for the patient portal called "MyChart".  Sign up information is provided on this After Visit Summary.  MyChart is used to connect with patients for Virtual Visits (Telemedicine).  Patients are able to view lab/test results, encounter notes, upcoming appointments, etc.  Non-urgent messages can be sent to your provider as well.   To learn more about what you can do with MyChart, go to [email or web address].    Your next appointment:   6 month(s)  The format for your next appointment:   In Person  Provider:   Eleonore Chiquito, MD  Your physician recommends that you schedule a follow-up appointment with Dr. Oval Linsey hypertension clinic.

## 2020-08-03 NOTE — Telephone Encounter (Signed)
Home Study to be scheduled.

## 2020-08-07 ENCOUNTER — Other Ambulatory Visit: Payer: Self-pay | Admitting: Cardiovascular Disease

## 2020-08-07 NOTE — Telephone Encounter (Signed)
*  STAT* If patient is at the pharmacy, call can be transferred to refill team.   1. Which medications need to be refilled? (please list name of each medication and dose if known)  spironolactone (ALDACTONE) 25 MG tablet  2. Which pharmacy/location (including street and city if local pharmacy) is medication to be sent to? [facility], [place]  3. Do they need a 30 day or 90 day supply? 90 with refills   Patient said rx got sent to [facility]. Pt does not use that pharmacy. Pt needs medication to go to [facility]

## 2020-08-09 MED ORDER — SPIRONOLACTONE 25 MG PO TABS: 25.0000 mg | ORAL_TABLET | Freq: Every day | ORAL | 3 refills | Status: DC

## 2020-09-06 ENCOUNTER — Other Ambulatory Visit: Payer: Self-pay

## 2020-09-06 ENCOUNTER — Ambulatory Visit (HOSPITAL_BASED_OUTPATIENT_CLINIC_OR_DEPARTMENT_OTHER): Payer: Medicaid Other | Attending: Cardiovascular Disease | Admitting: Cardiovascular Disease

## 2020-09-06 DIAGNOSIS — G4733 Obstructive sleep apnea (adult) (pediatric): Secondary | ICD-10-CM | POA: Diagnosis not present

## 2020-09-06 DIAGNOSIS — G4736 Sleep related hypoventilation in conditions classified elsewhere: Secondary | ICD-10-CM | POA: Diagnosis not present

## 2020-09-06 DIAGNOSIS — R0902 Hypoxemia: Secondary | ICD-10-CM | POA: Insufficient documentation

## 2020-09-06 DIAGNOSIS — R0683 Snoring: Secondary | ICD-10-CM | POA: Diagnosis not present

## 2020-09-16 ENCOUNTER — Other Ambulatory Visit: Payer: Self-pay

## 2020-09-16 ENCOUNTER — Encounter (HOSPITAL_COMMUNITY): Payer: Self-pay

## 2020-09-16 ENCOUNTER — Emergency Department (HOSPITAL_COMMUNITY)
Admission: EM | Admit: 2020-09-16 | Discharge: 2020-09-16 | Disposition: A | Discharge status: Home / Self Care | Payer: Medicaid Other | Attending: Emergency Medicine | Admitting: Emergency Medicine

## 2020-09-16 ENCOUNTER — Emergency Department (HOSPITAL_COMMUNITY): Payer: Medicaid Other

## 2020-09-16 DIAGNOSIS — S91012A Laceration without foreign body, left ankle, initial encounter: Secondary | ICD-10-CM | POA: Diagnosis not present

## 2020-09-16 DIAGNOSIS — Z79899 Other long term (current) drug therapy: Secondary | ICD-10-CM | POA: Insufficient documentation

## 2020-09-16 DIAGNOSIS — Z87891 Personal history of nicotine dependence: Secondary | ICD-10-CM | POA: Insufficient documentation

## 2020-09-16 DIAGNOSIS — Z7984 Long term (current) use of oral hypoglycemic drugs: Secondary | ICD-10-CM | POA: Insufficient documentation

## 2020-09-16 DIAGNOSIS — I10 Essential (primary) hypertension: Secondary | ICD-10-CM | POA: Insufficient documentation

## 2020-09-16 DIAGNOSIS — E119 Type 2 diabetes mellitus without complications: Secondary | ICD-10-CM | POA: Diagnosis not present

## 2020-09-16 DIAGNOSIS — W2209XA Striking against other stationary object, initial encounter: Secondary | ICD-10-CM | POA: Diagnosis not present

## 2020-09-16 DIAGNOSIS — S99912A Unspecified injury of left ankle, initial encounter: Secondary | ICD-10-CM | POA: Diagnosis present

## 2020-09-16 MED ORDER — LIDOCAINE-EPINEPHRINE 1 %-1:100000 IJ SOLN
10.0000 mL | Freq: Once | INTRAMUSCULAR | Status: AC
  Administered 2020-09-16: 10 mL
  Filled 2020-09-16: qty 1

## 2020-09-16 NOTE — ED Notes (Signed)
Patient transported to X-ray 

## 2020-09-16 NOTE — ED Notes (Signed)
Pt refused d/c vital signs 

## 2020-09-16 NOTE — ED Provider Notes (Signed)
[facility] EMERGENCY DEPARTMENT Provider Note   CSN: [id number] Arrival date & time: 09/16/20  1650     History Chief Complaint  Patient presents with  . Laceration    Miguel Brady is a 60 y.o. male.  HPI      Miguel Brady is a 60 y.o. male, with a history of DM, hyperlipidemia, HTN, bilateral foot neuropathy, presenting to the ED with injury to left lower extremity that occurred around 3 PM today.  Patient states he got upset and kicked a glass door, which broke, causing lacerations to the left ankle region.  He also endorses throbbing pain to the left ankle as well as swelling. According to our records, patient received Tdap update 2013. Denies anticoagulation.  He does admit to EtOH use last night. Ambulates using crutches at baseline. Denies acute numbness, weakness, head injury, neck/back pain, LOC, dizziness, chest pain, nausea/vomiting, headache, shortness of breath, other injuries, or any other complaints.   Past Medical History:  Diagnosis Date  . Diabetes mellitus   . GERD 05/05/2007  . Headache(784.0)   . Hyperlipidemia   . Hypertension   . Neuromuscular disorder ([place])    Pt had brain injury 04-02-2012 and pt has chronic left hip, leg and foot pain  . Neuropathy due to medical condition ([place])    bilateral feet  . Obesity   . 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  no inhaler  . Substance abuse ([place])    ETOH  . TOBACCO ABUSE 12/23/2008  . TRIGGER FINGER 05/05/2007  . Tuberculosis    pos PPD    Patient Active Problem List   Diagnosis Date Noted  . Vomiting 01/27/2016  . Abdominal pain 01/27/2016  . AKI (acute kidney injury) ([place]) 01/27/2016  . UTI (lower urinary tract infection) 01/27/2016  . Pyelonephritis 01/23/2016  . Nausea with vomiting 01/23/2016  . Sepsis ([place]) 01/23/2016  . Hypokalemia   . Left flank pain   . Leukocytosis   . Facial cellulitis 10/01/2015  . Periorbital cellulitis 09/30/2015  . HTN  (hypertension) 09/30/2015  . Cellulitis diffuse, face   . Facial swelling   . 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 Acute cholecystitis with chronic cholecystitis 07/27/2012  . TBI (traumatic brain injury) ([place]) 04/22/2012  . Traumatic closed fx of eight or more ribs with minimal displacement 04/03/2012  . Pelvic fracture ([place]) 04/03/2012  . Femur open fracture, left ([place]) 04/03/2012  . Open left tibial fracture 04/03/2012  . Lumbar transverse process fracture ([place]) 04/03/2012  . Frontal skull fracture ([place]) 04/03/2012  . Patella fracture, left 04/03/2012  . SKIN LESION 03/02/2010  . DENTAL CARIES 02/07/2010  . HEADACHE 02/07/2010  . HLD (hyperlipidemia) 01/30/2009  . OBESITY 12/23/2008  . TOBACCO ABUSE 12/23/2008  . 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 DM2 (diabetes mellitus, type 2) ([place]) 09/15/2008  . GERD 05/05/2007  . TRIGGER FINGER 05/05/2007  . INJURY NOS, FINGER 05/05/2007  . 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 CHEST TUBE INSERTION  04/03/2012   Procedure: CHEST TUBE INSERTION;  Surgeon: Zenovia Jarred, MD;  Location: [facility];  Service: General;  Laterality: Left;  . CHOLECYSTECTOMY  07/28/2012   Procedure: LAPAROSCOPIC CHOLECYSTECTOMY;  Surgeon: Harl Bowie, MD;  Location: [facility];  Service: General;  Laterality: N/A;  . EXTERNAL FIXATION LEG  04/03/2012   Procedure: EXTERNAL FIXATION LEG;  Surgeon: Astrid Divine  Carola Frost, MD;  Location: [facility];  Service: Orthopedics;  Laterality: Left;  Left femur  . EXTERNAL FIXATION PELVIS  04/03/2012   Procedure: EXTERNAL FIXATION PELVIS;  Surgeon: Budd Palmer, MD;  Location: [facility] OR;  Service: Orthopedics;;  . FEMUR IM NAIL  04/07/2012   Procedure: INTRAMEDULLARY (IM) NAIL FEMORAL;  Surgeon: Budd Palmer, MD;  Location: [facility] OR;  Service: Orthopedics;  Laterality: Left;  . FLEXIBLE BRONCHOSCOPY  04/07/2012   Procedure: FLEXIBLE  BRONCHOSCOPY;  Surgeon: Liz Malady, MD;  Location: [facility] OR;  Service: General;;  START TIME=1645 END TIME=1700  . INCISION AND DRAINAGE OF WOUND  04/03/2012   Procedure: IRRIGATION AND DEBRIDEMENT WOUND;  Surgeon: Clydene Fake, MD;  Location: [facility] OR;  Service: Neurosurgery;  Laterality: N/A;  Frontal.  . ORIF PATELLA  04/07/2012   Procedure: OPEN REDUCTION INTERNAL (ORIF) FIXATION PATELLA;  Surgeon: Budd Palmer, MD;  Location: [facility] OR;  Service: Orthopedics;  Laterality: Left;  . ORIF PELVIC FRACTURE  04/07/2012   Procedure: OPEN REDUCTION INTERNAL FIXATION (ORIF) PELVIC FRACTURE;  Surgeon: Budd Palmer, MD;  Location: [facility] OR;  Service: Orthopedics;  Laterality: N/A;  Right and left sacroiliac screw pinning,Irrigation and debridebridement open tibia and femur,removal external fixator.  . TIBIA IM NAIL INSERTION  04/07/2012   Procedure: INTRAMEDULLARY (IM) NAIL TIBIAL;  Surgeon: Budd Palmer, MD;  Location: [facility] OR;  Service: Orthopedics;  Laterality: Left;       Family History  Problem Relation Age of Onset  . Cancer Father   . Colon cancer Father   . Heart disease Mother   . Colon cancer Paternal Uncle        pt thinks 8 pat uncles  . Esophageal cancer Neg Hx   . Rectal cancer Neg Hx   . Stomach cancer Neg Hx     Social History   Tobacco Use  . Smoking status: Former Smoker    Packs/day: 0.50    Years: 30.00    Pack years: 15.00    Types: Cigarettes    Start date: 06/25/2020  . Smokeless tobacco: Never Used  Vaping Use  . Vaping Use: Never used  Substance Use Topics  . Alcohol use: Yes    Alcohol/week: 0.0 standard drinks    Comment: occ  . Drug use: No    Comment: pt denies marijuana use for couple of years    Home Medications Prior to Admission medications   Medication Sig Start Date End Date Taking? Authorizing Provider  amLODipine (NORVASC) 10 MG tablet Take 10 mg by mouth at bedtime.     [provider]  B-D 3CC LUER-LOK SYR 22GX1" 22G X 1" 3 ML  MISC See admin instructions. 02/28/20   [provider]  carvedilol (COREG) 25 MG tablet Take 1 tablet (25 mg total) by mouth 2 (two) times daily. 07/05/20 10/03/20  O'NealRonnald Ramp, MD  chlorzoxazone (PARAFON) 500 MG tablet Take by mouth 4 (four) times daily as needed for muscle spasms.    [provider]  cholecalciferol (VITAMIN D) 1000 UNITS tablet Take 1,000 Units by mouth at bedtime.     [provider]  cyclobenzaprine (FLEXERIL) 10 MG tablet Take 1 tablet (10 mg total) by mouth 3 (three) times daily as needed for muscle spasms. 10/31/16   Street, Mercedes, PA-C  diphenhydrAMINE (SOMINEX) 25 MG tablet Take 25 mg by mouth daily as needed for allergies or sleep.    [provider]  furosemide (LASIX) 40 MG  tablet Take 40 mg by mouth at bedtime. 08/21/15   [provider]  gabapentin (NEURONTIN) 300 MG capsule Take 900 mg by mouth 3 (three) times daily. 02/07/20   [provider]  GRALISE 600 MG TABS Take 600-1,200 mg by mouth at bedtime.  07/07/15   [provider]  lisinopril (ZESTRIL) 40 MG tablet Take 40 mg by mouth daily. 02/07/20   [provider]  metFORMIN (GLUCOPHAGE) 1000 MG tablet Take 1,000 mg by mouth at bedtime. 07/07/15   [provider]  methocarbamol (ROBAXIN) 500 MG tablet Take 500 mg by mouth 4 (four) times daily.    [provider]  naproxen (NAPROSYN) 500 MG tablet Take 1 tablet (500 mg total) by mouth 2 (two) times daily as needed for mild pain, moderate pain or headache (TAKE WITH MEALS.). 10/31/16   Street, [place], PA-C  ofloxacin (OCUFLOX) 0.3 % ophthalmic solution  02/07/20   [provider]  oxyCODONE-acetaminophen (PERCOCET) 10-325 MG tablet Take 1 tablet by mouth every 6 (six) hours as needed. 02/13/20   [provider]  pantoprazole (PROTONIX) 40 MG tablet Take 40 mg by mouth at bedtime.     [provider]  potassium chloride (K-DUR,KLOR-CON) 10 MEQ  tablet Take 10 mEq by mouth at bedtime.  09/21/15   [provider]  pravastatin (PRAVACHOL) 20 MG tablet Take 20 mg by mouth at bedtime. 09/15/15   [provider]  sildenafil (VIAGRA) 100 MG tablet Take 100 mg by mouth daily as needed for erectile dysfunction.    [provider]  spironolactone (ALDACTONE) 25 MG tablet Take 1 tablet (25 mg total) by mouth daily. 08/09/20 11/07/20  Geralynn Rile, MD  testosterone cypionate (DEPOTESTOSTERONE CYPIONATE) 200 MG/ML injection Inject 1 mL into the muscle every 28 (twenty-eight) days. 09/21/15   [provider]    Allergies    Shellfish allergy, Iodine, and Penicillins  Review of Systems   Review of Systems  Constitutional: Negative for diaphoresis.  Eyes: Negative for visual disturbance.  Respiratory: Negative for shortness of breath.   Cardiovascular: Negative for chest pain.  Gastrointestinal: Negative for abdominal pain, nausea and vomiting.  Musculoskeletal: Positive for arthralgias and joint swelling.  Skin: Positive for wound.  Neurological: Negative for dizziness, syncope, weakness, numbness and headaches.  All other systems reviewed and are negative.   Physical Exam Updated Vital Signs BP (!) 161/74 (BP Location: Right Arm)   Pulse 84   Resp 20   SpO2 95%   Physical Exam Vitals and nursing note reviewed.  Constitutional:      General: He is not in acute distress.    Appearance: He is well-developed and well-nourished. He is obese. He is not diaphoretic.  HENT:     Head: Normocephalic and atraumatic.  Eyes:     Conjunctiva/sclera: Conjunctivae normal.  Cardiovascular:     Rate and Rhythm: Normal rate and regular rhythm.     Pulses:          Dorsalis pedis pulses are 2+ on the left side.       Posterior tibial pulses are 2+ on the left side.  Pulmonary:     Effort: Pulmonary effort is normal.  Abdominal:     Tenderness: There is no guarding.  Musculoskeletal:     Cervical back:  Neck supple.     Comments: Three main lacerations identified to the left lateral ankle. Two of these appear superficial and do not seem as though they would benefit from laceration  repair. 7 cm laceration without noted evidence of deep tissue damage.  Steady, nonpulsatile bleeding from this laceration.  Hemorrhage controlled with direct pressure and then application of quick clot bandage. Tenderness across the left anterior and lateral ankle.  There is some swelling to the left ankle greater than the right.  Patient states he has edema in the lower extremities at baseline, however, the left one "might be more swollen." No tenderness or pain to the rest of the left lower extremity.  No tenderness or pain in the hips or pelvis.  Pelvis appears to be stable.  Skin:    General: Skin is warm and dry.     Capillary Refill: Capillary refill takes less than 2 seconds.     Coloration: Skin is not pale.  Neurological:     Mental Status: He is alert.     Comments: Sensation light touch consistent with the patient's baseline. Flexion and extension intact in the left ankle.  Motor function intact in the left toes.  Psychiatric:        Mood and Affect: Mood and affect normal.        Behavior: Behavior normal.           ED Results / Procedures / Treatments   Labs (all labs ordered are listed, but only abnormal results are displayed) Labs Reviewed - No data to display  EKG None  Radiology DG Ankle Complete Left  Result Date: 09/16/2020 CLINICAL DATA:  Lacerations, pain and swelling after kicked a glass door. EXAM: LEFT ANKLE COMPLETE - 3+ VIEW; LEFT FOOT - COMPLETE 3+ VIEW COMPARISON:  08/23/2014 FINDINGS: Diffuse bone demineralization. Postoperative intramedullary rod fixation of the tibia, incompletely included within the field of view. Old fracture deformities of the distal tibia and fibula. No evidence of acute fracture or dislocation. No focal bone lesion or bone destruction. Diffuse soft  tissue swelling of the ankle. No radiopaque soft tissue foreign bodies or soft tissue gas identified. A bandage is projected over the distal fibula. IMPRESSION: Old fracture deformities of the distal tibia and fibula with postoperative intramedullary rod fixation of the tibia. No acute bony abnormalities in the left foot or ankle. Soft tissue swelling. Electronically Signed   By: Lucienne Capers M.D.   On: 09/16/2020 18:21   DG Foot Complete Left  Result Date: 09/16/2020 CLINICAL DATA:  Lacerations, pain and swelling after kicked a glass door. EXAM: LEFT ANKLE COMPLETE - 3+ VIEW; LEFT FOOT - COMPLETE 3+ VIEW COMPARISON:  08/23/2014 FINDINGS: Diffuse bone demineralization. Postoperative intramedullary rod fixation of the tibia, incompletely included within the field of view. Old fracture deformities of the distal tibia and fibula. No evidence of acute fracture or dislocation. No focal bone lesion or bone destruction. Diffuse soft tissue swelling of the ankle. No radiopaque soft tissue foreign bodies or soft tissue gas identified. A bandage is projected over the distal fibula. IMPRESSION: Old fracture deformities of the distal tibia and fibula with postoperative intramedullary rod fixation of the tibia. No acute bony abnormalities in the left foot or ankle. Soft tissue swelling. Electronically Signed   By: Lucienne Capers M.D.   On: 09/16/2020 18:21    Procedures .Marland KitchenLaceration Repair  Date/Time: 09/16/2020 7:20 PM Performed by: Lorayne Bender, PA-C Authorized by: Lorayne Bender, PA-C   Consent:    Consent obtained:  Verbal   Consent given by:  Patient   Risks, benefits, and alternatives were discussed: yes     Risks discussed:  Infection, need for additional  repair, retained foreign body, tendon damage, vascular damage, poor wound healing, poor cosmetic result, pain and nerve damage Universal protocol:    Procedure explained and questions answered to patient or proxy's satisfaction: yes     Patient  identity confirmed:  Verbally with patient and provided demographic data Laceration details:    Location:  Leg   Leg location:  L lower leg   Length (cm):  7 Pre-procedure details:    Preparation:  Patient was prepped and draped in usual sterile fashion and imaging obtained to evaluate for foreign bodies Exploration:    Imaging obtained: x-ray     Imaging outcome: foreign body not noted     Wound exploration: wound explored through full range of motion and entire depth of wound visualized   Treatment:    Area cleansed with:  Povidone-iodine and saline   Amount of cleaning:  Extensive   Irrigation solution:  Sterile saline   Irrigation method:  Syringe Skin repair:    Repair method:  Sutures   Suture size:  3-0   Suture material:  Prolene   Suture technique:  Simple interrupted   Number of sutures:  11 Approximation:    Approximation:  Close Repair type:    Repair type:  Simple Post-procedure details:    Dressing:  Non-adherent dressing and sterile dressing   Procedure completion:  Tolerated well, no immediate complications   (including critical care time)  Medications Ordered in ED Medications  lidocaine-EPINEPHrine (XYLOCAINE W/EPI) 1 %-1:100000 (with pres) injection 10 mL (10 mLs Infiltration Given by Other 09/16/20 1825)    ED Course  I have reviewed the triage vital signs and the nursing notes.  Pertinent labs & imaging results that were available during my care of the patient were reviewed by me and considered in my medical decision making (see chart for details).    MDM Rules/Calculators/A&P                          Patient presents for evaluation of a left lower leg injury caused by broken glass.  We discussed the possibility for retained foreign body anytime glass is involved in an injury.  However, I was reasonably able to see the depth of the wound and did not observe any foreign bodies. Patient to return in about 10 days for suture removal. Patient and  significant other at the bedside were given instructions for home care as well as return precautions.  Both parties voice understanding of these instructions, accept the plan, and are comfortable with discharge.  Findings and plan of care discussed with attending physician, Lajean Saver, MD. Dr. Ashok Cordia personally evaluated and examined this patient.  Final Clinical Impression(s) / ED Diagnoses Final diagnoses:  Laceration of left ankle, initial encounter    Rx / DC Orders ED Discharge Orders    None       Layla Maw 09/17/20 1741    Lajean Saver, MD 09/17/20 2037

## 2020-09-16 NOTE — Discharge Instructions (Signed)
  Wound Care - Laceration You may remove the bandage after 24 hours. Clean the wound and surrounding area gently with tap water and mild soap. Rinse well and blot dry. Do not scrub the wound, as this may cause the wound edges to come apart. You may shower, but avoid submerging the wound, such as with a bath or swimming. Clean the wound daily to prevent infection. Do not use cleaners such as hydrogen peroxide or alcohol.   Scar reduction: Application of a topical antibiotic ointment, such as Neosporin, after the wound has begun to close and heal well can decrease scab formation and reduce scarring. After the wound has healed and wound closures have been removed, application of ointments such as Aquaphor can also reduce scar formation.  The key to scar reduction is keeping the skin well hydrated and supple. Drinking plenty of water throughout the day (At least eight 8oz glasses of water a day) is essential to staying well hydrated.  Sun exposure: Keep the wound out of the sun. After the wound has healed, continue to protect it from the sun by wearing protective clothing or applying sunscreen.  Pain: You may use Tylenol or your home pain medication for pain.  Suture/staple removal: Return to the ED in 8-12 days for suture removal.  Return to the ED sooner should the wound edges come apart or signs of infection arise, such as spreading redness, puffiness/swelling, pus draining from the wound, severe increase in pain, fever over 100.27F, or any other major issues.  For prescription assistance, may try using prescription discount sites or apps, such as goodrx.com

## 2020-09-16 NOTE — ED Notes (Signed)
Pt attempted to get out of bed and walk to in room bathroom.  Left lower leg bleeding.  PA in to assess patient.

## 2020-09-16 NOTE — ED Triage Notes (Signed)
To room via EMS.  Pt was walking through doorway, using crutch d/t prior injury to right leg, fell through glass door.  Lac to left lateral lower leg.   No LOC, did hit head, C collar on.   EMS BP 158/106 HR 84 RR 18 SpO2 96%  CBG 173 Temp 97.2

## 2020-09-18 ENCOUNTER — Encounter (HOSPITAL_BASED_OUTPATIENT_CLINIC_OR_DEPARTMENT_OTHER): Payer: Self-pay | Admitting: Cardiovascular Disease

## 2020-09-18 NOTE — Procedures (Signed)
     Patient Name: Miguel Brady, Miguel Brady Date: 09/07/2020 Gender: Male D.O.B: 15-Apr-1961 Age (years): 58 Referring Provider: Ronnald Ramp ONeal Height (inches): 72 Interpreting Physician: Nicki Guadalajara MD, ABSM Weight (lbs): 295 RPSGT: Elaina Pattee BMI: 40 MRN: [id number] Neck Size: 16.00  CLINICAL INFORMATION Sleep Study Type: HST  Indication for sleep study: snoring, difficult to control hypertension  Epworth Sleepiness Score: 2  SLEEP STUDY TECHNIQUE A multi-channel overnight portable sleep study was performed. The channels recorded were: nasal airflow, thoracic respiratory movement, and oxygen saturation with a pulse oximetry. Snoring was also monitored.  MEDICATIONS amLODipine (NORVASC) 10 MG tablet B-D 3CC LUER-LOK SYR 22GX1" 22G X 1" 3 ML MISC carvedilol (COREG) 25 MG tablet chlorzoxazone (PARAFON) 500 MG tablet cholecalciferol (VITAMIN D) 1000 UNITS tablet cyclobenzaprine (FLEXERIL) 10 MG tablet diphenhydrAMINE (SOMINEX) 25 MG tablet furosemide (LASIX) 40 MG tablet gabapentin (NEURONTIN) 300 MG capsule GRALISE 600 MG TABS lisinopril (ZESTRIL) 40 MG tablet metFORMIN (GLUCOPHAGE) 1000 MG tablet methocarbamol (ROBAXIN) 500 MG tablet naproxen (NAPROSYN) 500 MG tablet ofloxacin (OCUFLOX) 0.3 % ophthalmic solution oxyCODONE-acetaminophen (PERCOCET) 10-325 MG tablet pantoprazole (PROTONIX) 40 MG tablet potassium chloride (K-DUR,KLOR-CON) 10 MEQ tablet pravastatin (PRAVACHOL) 20 MG tablet sildenafil (VIAGRA) 100 MG tablet spironolactone (ALDACTONE) 25 MG tablet testosterone cypionate (DEPOTESTOSTERONE CYPIONATE) 200 MG/ML injection Patient self administered medications include: N/A.  SLEEP ARCHITECTURE Patient was studied for 376 minutes. The sleep efficiency was 100.0 % and the patient was supine for 0%. The arousal index was 0.0 per hour.  RESPIRATORY PARAMETERS The overall AHI was 37.0 per hour, with a central apnea index of 0.0 per hour.  The  oxygen nadir was 68% during sleep.  CARDIAC DATA Mean heart rate during sleep was 68.3 bpm.  IMPRESSIONS - Severe obstructive sleep apnea occurred during this study (AHI 37.0/h). Supine sleep was absent. The severity during REM sleep cannot be assessed on this home study. - No significant central sleep apnea occurred during this study (CAI = 0.0/h). - Severe oxygen desaturation to a nadir of 68%. - No snoring was audible during this study.  DIAGNOSIS - Obstructive Sleep Apnea (G47.33) - Nocturnal Hypoxemia (G47.36)  RECOMMENDATIONS - In this patient with significant cardiovascular co-morbidities including diastolic heart failure, difficult to control hypertension, diabetes mellitus and morbid obesity with severe sleep apnea and significant nocturnal oxygen desaturation recommend an in-lab CPAP titration for optimal treatment and assessment during REM sleep. - Effort should be made to optimize nasal and oropharyngeal patency. - Avoid alcohol, sedatives and other CNS depressants that may worsen sleep apnea and disrupt normal sleep architecture. - Sleep hygiene should be reviewed to assess factors that may improve sleep quality. - Weight management and regular exercise should be initiated or continued. - Recommend a download and sleep clinic assessment after CPAP initiation.   [Electronically signed] 09/18/2020 04:45 PM  Nicki Guadalajara MD, [facility], ABSM Diplomate, American Board of Sleep Medicine   NPI: [id number] [facility] PH: [phone]   FX: [phone] ACCREDITED BY THE AMERICAN ACADEMY OF SLEEP MEDICINE

## 2020-09-20 ENCOUNTER — Ambulatory Visit: Payer: Medicaid Other | Admitting: Cardiovascular Disease

## 2020-09-25 ENCOUNTER — Encounter (HOSPITAL_COMMUNITY): Payer: Self-pay | Admitting: Emergency Medicine

## 2020-09-25 ENCOUNTER — Emergency Department (HOSPITAL_COMMUNITY)
Admission: EM | Admit: 2020-09-25 | Discharge: 2020-09-25 | Disposition: A | Discharge status: Home / Self Care | Payer: Medicaid Other | Attending: Emergency Medicine | Admitting: Emergency Medicine

## 2020-09-25 ENCOUNTER — Other Ambulatory Visit: Payer: Self-pay

## 2020-09-25 DIAGNOSIS — Z4802 Encounter for removal of sutures: Secondary | ICD-10-CM | POA: Diagnosis not present

## 2020-09-25 DIAGNOSIS — Z5321 Procedure and treatment not carried out due to patient leaving prior to being seen by health care provider: Secondary | ICD-10-CM | POA: Insufficient documentation

## 2020-09-25 NOTE — ED Triage Notes (Signed)
Pt here for suture removal to L ankle.  States sutures were placed on 1/1.

## 2020-09-25 NOTE — ED Notes (Signed)
Pt told registration he was leaving and would remove sutures himself.

## 2020-09-26 ENCOUNTER — Telehealth: Payer: Self-pay | Admitting: *Deleted

## 2020-09-26 DIAGNOSIS — G4733 Obstructive sleep apnea (adult) (pediatric): Secondary | ICD-10-CM

## 2020-09-26 NOTE — Telephone Encounter (Signed)
-----   Message from Troy Sine, MD sent at 09/18/2020  4:50 PM EST ----- Joycelyn Schmid, please notify pt and set up for in-lab CPAP titration study

## 2020-09-26 NOTE — Telephone Encounter (Signed)
Informed patient of sleep study results and patient understanding was verbalized. Patient understands her sleep study showed   IMPRESSIONS - Severe obstructive sleep apnea occurred during this study (AHI 37.0/h). Supine sleep was absent. The severity during REM sleep cannot be assessed on this home study. - Severe oxygen desaturation to a nadir of 68%. - No snoring was audible during this study.  DIAGNOSIS - Obstructive Sleep Apnea (G47.33) - Nocturnal Hypoxemia (G47.36)  RECOMMENDATIONS  recommend an in-lab CPAP titration for optimal treatment and assessment during REM sleep. Pt is aware of his results.

## 2020-09-27 ENCOUNTER — Ambulatory Visit: Payer: Medicaid Other | Admitting: Podiatry

## 2020-10-11 ENCOUNTER — Ambulatory Visit (INDEPENDENT_AMBULATORY_CARE_PROVIDER_SITE_OTHER): Payer: Medicaid Other | Admitting: Podiatry

## 2020-10-11 ENCOUNTER — Other Ambulatory Visit: Payer: Self-pay

## 2020-10-11 DIAGNOSIS — L97522 Non-pressure chronic ulcer of other part of left foot with fat layer exposed: Secondary | ICD-10-CM | POA: Diagnosis not present

## 2020-10-11 DIAGNOSIS — L97922 Non-pressure chronic ulcer of unspecified part of left lower leg with fat layer exposed: Secondary | ICD-10-CM

## 2020-10-11 DIAGNOSIS — E119 Type 2 diabetes mellitus without complications: Secondary | ICD-10-CM | POA: Diagnosis not present

## 2020-10-11 DIAGNOSIS — Z794 Long term (current) use of insulin: Secondary | ICD-10-CM | POA: Diagnosis not present

## 2020-10-12 ENCOUNTER — Encounter: Payer: Self-pay | Admitting: Podiatry

## 2020-10-12 NOTE — Progress Notes (Signed)
Subjective:  Patient ID: Miguel Brady, male    DOB: September 29, 1960,  MRN: [id number]  Chief Complaint  Patient presents with  . routine foot care    Nail trim     60 y.o. male presents for wound care.  Patient presents with complaint left leg ulcer with fat layer exposed.  Patient states that this has been going on for a little bit of time.  He states he had a little trauma to the area and then the ulcer has not healed.  He is a diabetic with last A1c of 7.0.  He states looking a little bit better he has not been doing any dressing changes and wanted me to take a look at it.  He denies any other acute complaints.   Review of Systems: Negative except as noted in the HPI. Denies N/V/F/Ch.  Past Medical History:  Diagnosis Date  . Diabetes mellitus   . GERD 05/05/2007  . Headache(784.0)   . Hyperlipidemia   . Hypertension   . 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 Obesity   . 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  no inhaler  . Substance abuse ([place])    ETOH  . TOBACCO ABUSE 12/23/2008  . TRIGGER FINGER 05/05/2007  . Tuberculosis    pos PPD    Current Outpatient Medications:  .  amLODipine (NORVASC) 10 MG tablet, Take 10 mg by mouth at bedtime. , Disp: , Rfl:  .  B-D 3CC LUER-LOK SYR 22GX1" 22G X 1" 3 ML MISC, See admin instructions., Disp: , Rfl:  .  carvedilol (COREG) 25 MG tablet, Take 1 tablet (25 mg total) by mouth 2 (two) times daily., Disp: 180 tablet, Rfl: 3 .  chlorzoxazone (PARAFON) 500 MG tablet, Take by mouth 4 (four) times daily as needed for muscle spasms., Disp: , Rfl:  .  cholecalciferol (VITAMIN D) 1000 UNITS tablet, Take 1,000 Units by mouth at bedtime. , Disp: , Rfl:  .  cyclobenzaprine (FLEXERIL) 10 MG tablet, Take 1 tablet (10 mg total) by mouth 3 (three) times daily as needed for muscle spasms., Disp: 15 tablet, Rfl:  0 .  diphenhydrAMINE (SOMINEX) 25 MG tablet, Take 25 mg by mouth daily as needed for allergies or sleep., Disp: , Rfl:  .  furosemide (LASIX) 40 MG tablet, Take 40 mg by mouth at bedtime., Disp: , Rfl: 0 .  gabapentin (NEURONTIN) 300 MG capsule, Take 900 mg by mouth 3 (three) times daily., Disp: , Rfl:  .  GRALISE 600 MG TABS, Take 600-1,200 mg by mouth at bedtime. , Disp: , Rfl: 4 .  lisinopril (ZESTRIL) 40 MG tablet, Take 40 mg by mouth daily., Disp: , Rfl:  .  metFORMIN (GLUCOPHAGE) 1000 MG tablet, Take 1,000 mg by mouth at bedtime., Disp: , Rfl: 1 .  methocarbamol (ROBAXIN) 500 MG tablet, Take 500 mg by mouth 4 (four) times daily., Disp: , Rfl:  .  naproxen (NAPROSYN) 500 MG tablet, Take 1 tablet (500 mg total) by mouth 2 (two) times daily as needed for mild pain, moderate pain or headache (TAKE WITH MEALS.)., Disp: 20 tablet, Rfl: 0 .  ofloxacin (OCUFLOX) 0.3 % ophthalmic solution, , Disp: , Rfl:  .  oxyCODONE-acetaminophen (PERCOCET) 10-325 MG tablet, Take 1 tablet by mouth every 6 (six) hours as needed., Disp: , Rfl:  .  pantoprazole (PROTONIX) 40 MG tablet, Take 40 mg by mouth at bedtime. , Disp: , Rfl:  .  potassium chloride (K-DUR,KLOR-CON) 10 MEQ tablet, Take 10 mEq by mouth at bedtime. , Disp: , Rfl: 0 .  pravastatin (PRAVACHOL) 20 MG tablet, Take 20 mg by mouth at bedtime., Disp: , Rfl: 1 .  sildenafil (VIAGRA) 100 MG tablet, Take 100 mg by mouth daily as needed for erectile dysfunction., Disp: , Rfl:  .  spironolactone (ALDACTONE) 25 MG tablet, Take 1 tablet (25 mg total) by mouth daily., Disp: 90 tablet, Rfl: 3 .  testosterone cypionate (DEPOTESTOSTERONE CYPIONATE) 200 MG/ML injection, Inject 1 mL into the muscle every 28 (twenty-eight) days., Disp: , Rfl: 0  Social History   Tobacco Use  Smoking Status Former Smoker  . Packs/day: 0.50  . Years: 30.00  . Pack years: 15.00  . Types: Cigarettes  . Start date: 06/25/2020  Smokeless Tobacco Never Used    Allergies  Allergen  Reactions  . Shellfish Allergy Anaphylaxis  . Iodine Rash  . Penicillins Itching    Has patient had a PCN reaction causing immediate rash, facial/tongue/throat swelling, SOB or lightheadedness with hypotension: No Has patient had a PCN reaction causing severe rash involving mucus membranes or skin necrosis: No Has patient had a PCN reaction that required hospitalization No Has patient had a PCN reaction occurring within the last 10 years: No If all of the above answers are "NO", then may proceed with Cephalosporin use.   Objective:  There were no vitals filed for this visit. There is no height or weight on file to calculate BMI. Constitutional Well developed. Well nourished.  Vascular Dorsalis pedis pulses palpable bilaterally. Posterior tibial pulses palpable bilaterally. Capillary refill normal to all digits.  No cyanosis or clubbing noted. Pedal hair growth normal.  Neurologic Normal speech. Oriented to person, place, and time. Protective sensation absent  Dermatologic Wound Location: Left leg ulcer with fat layer exposed.  Does not probe down to bone.  No purulent drainage expressed.  No malodor present. Wound Base: Mixed Granular/Fibrotic Peri-wound: Calloused Exudate: Scant/small amount Serosanguinous exudate Wound Measurements: -See below  Orthopedic: No pain to palpation either foot.   Radiographs: None Assessment:   1. Type 2 diabetes mellitus without complication, with long-term current use of insulin ([place])   2. 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  Ulcer left leg ulcer with fat layer exposed -Debridement as below. -Dressed with Betadine wet-to-dry, DSD. -Continue off-loading with surgical shoe.  Procedure: Excisional Debridement of Wound Tool: Sharp chisel blade/tissue nipper Rationale: Removal of non-viable soft tissue from the wound to promote healing.  Anesthesia:  none Pre-Debridement Wound Measurements: 4 cm x 2.5 cm x 0.5 cm  Post-Debridement Wound Measurements: 4.2 cm x 2.6 cm x 0.5 cm  Type of Debridement: Sharp Excisional Tissue Removed: Non-viable soft tissue Blood loss: Minimal (<50cc) Depth of Debridement: subcutaneous tissue. Technique: Sharp excisional debridement to bleeding, viable wound base.  Wound Progress: This is my initial evaluation and I will continue monitor the progression of it  Site healing conversation 7 Dressing: Dry, sterile, compression dressing. Disposition: Patient tolerated procedure well. Patient to return in 1 week for follow-up.  No follow-ups on file.

## 2020-10-23 ENCOUNTER — Other Ambulatory Visit: Payer: Self-pay

## 2020-10-23 ENCOUNTER — Encounter: Payer: Self-pay | Admitting: Cardiovascular Disease

## 2020-10-23 ENCOUNTER — Other Ambulatory Visit: Payer: Self-pay | Admitting: *Deleted

## 2020-10-23 ENCOUNTER — Ambulatory Visit: Payer: Medicaid Other | Admitting: Cardiovascular Disease

## 2020-10-23 VITALS — BP 138/88 | HR 66 | Ht 72.0 in | Wt 308.0 lb

## 2020-10-23 DIAGNOSIS — F17200 Nicotine dependence, unspecified, uncomplicated: Secondary | ICD-10-CM

## 2020-10-23 DIAGNOSIS — R072 Precordial pain: Secondary | ICD-10-CM

## 2020-10-23 DIAGNOSIS — Z01818 Encounter for other preprocedural examination: Secondary | ICD-10-CM | POA: Diagnosis not present

## 2020-10-23 DIAGNOSIS — I10 Essential (primary) hypertension: Secondary | ICD-10-CM

## 2020-10-23 DIAGNOSIS — E7800 Pure hypercholesterolemia, unspecified: Secondary | ICD-10-CM | POA: Diagnosis not present

## 2020-10-23 HISTORY — DX: Encounter for other preprocedural examination: Z01.818

## 2020-10-23 MED ORDER — CHLORTHALIDONE 25 MG PO TABS: 25.0000 mg | ORAL_TABLET | Freq: Every day | ORAL | 3 refills | Status: DC
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
## 2020-10-23 NOTE — Addendum Note (Signed)
Addended by: Alvina Filbert B on: 10/23/2020 06:06 PM   Modules accepted: Orders

## 2020-10-23 NOTE — Patient Instructions (Addendum)
Medication Instructions:  STOP FUROSEMIDE   START CHLORTHALIDONE 25 MG DAILY    Labwork: BMET IN 1 WEEK    Testing/Procedures: Your physician has requested that you have a lexiscan myoview. For further information please visit [email or web address]. Please follow instruction sheet, as given. TO BE DONE AT [place]  2 DAY    Follow-Up: 12/21/2020 AT 1:30 PM WITH PHARM D  02/26/2021 AT 1:20 PM WITH DR [facility]    You will receive a phone call from the PREP exercise and nutrition program to schedule an initial assessment.  Special Instructions:   MONITOR YOUR BLOOD PRESSURE TWICE A DAY WITH MACHINE YOU HAVE BEEN GIVEN  DASH Eating Plan DASH stands for "Dietary Approaches to Stop Hypertension." The DASH eating plan is a healthy eating plan that has been shown to reduce high blood pressure (hypertension). It may also reduce your risk for type 2 diabetes, heart disease, and stroke. The DASH eating plan may also help with weight loss. What are tips for following this plan?  General guidelines  Avoid eating more than 2,300 mg (milligrams) of salt (sodium) a day. If you have hypertension, you may need to reduce your sodium intake to 1,500 mg a day.  Limit alcohol intake to no more than 1 drink a day for nonpregnant women and 2 drinks a day for men. One drink equals 12 oz of beer, 5 oz of wine, or 1 oz of hard liquor.  Work with your health care provider to maintain a healthy body weight or to lose weight. Ask what an ideal weight is for you.  Get at least 30 minutes of exercise that causes your heart to beat faster (aerobic exercise) most days of the week. Activities may include walking, swimming, or biking.  Work with your health care provider or diet and nutrition specialist (dietitian) to adjust your eating plan to your individual calorie needs. Reading food labels   Check food labels for the amount of sodium per serving. Choose foods with less than 5 percent of the Daily Value of  sodium. Generally, foods with less than 300 mg of sodium per serving fit into this eating plan.  To find whole grains, look for the word "whole" as the first word in the ingredient list. Shopping  Buy products labeled as "low-sodium" or "no salt added."  Buy fresh foods. Avoid canned foods and premade or frozen meals. Cooking  Avoid adding salt when cooking. Use salt-free seasonings or herbs instead of table salt or sea salt. Check with your health care provider or pharmacist before using salt substitutes.  Do not fry foods. Cook foods using healthy methods such as baking, boiling, grilling, and broiling instead.  Cook with heart-healthy oils, such as olive, canola, soybean, or sunflower oil. Meal planning  Eat a balanced diet that includes: ? 5 or more servings of fruits and vegetables each day. At each meal, try to fill half of your plate with fruits and vegetables. ? Up to 6-8 servings of whole grains each day. ? Less than 6 oz of lean meat, poultry, or fish each day. A 3-oz serving of meat is about the same size as a deck of cards. One egg equals 1 oz. ? 2 servings of low-fat dairy each day. ? A serving of nuts, seeds, or beans 5 times each week. ? Heart-healthy fats. Healthy fats called Omega-3 fatty acids are found in foods such as flaxseeds and coldwater fish, like sardines, salmon, and mackerel.  Limit how much you eat of  the following: ? Canned or prepackaged foods. ? Food that is high in trans fat, such as fried foods. ? Food that is high in saturated fat, such as fatty meat. ? Sweets, desserts, sugary drinks, and other foods with added sugar. ? Full-fat dairy products.  Do not salt foods before eating.  Try to eat at least 2 vegetarian meals each week.  Eat more home-cooked food and less restaurant, buffet, and fast food.  When eating at a restaurant, ask that your food be prepared with less salt or no salt, if possible. What foods are recommended? The items listed  may not be a complete list. Talk with your dietitian about what dietary choices are best for you. Grains Whole-grain or whole-wheat bread. Whole-grain or whole-wheat pasta. Brown rice. [place]. Bulgur. Whole-grain and low-sodium cereals. Pita bread. Low-fat, low-sodium crackers. Whole-wheat flour tortillas. Vegetables Fresh or frozen vegetables (raw, steamed, roasted, or grilled). Low-sodium or reduced-sodium tomato and vegetable juice. Low-sodium or reduced-sodium tomato sauce and tomato paste. Low-sodium or reduced-sodium canned vegetables. Fruits All fresh, dried, or frozen fruit. Canned fruit in natural juice (without added sugar). Meat and other protein foods Skinless chicken or [place]. Ground chicken or [place]. Pork with fat trimmed off. Fish and seafood. Egg whites. Dried beans, peas, or lentils. Unsalted nuts, nut butters, and seeds. Unsalted canned beans. Lean cuts of beef with fat trimmed off. Low-sodium, lean deli meat. Dairy Low-fat (1%) or fat-free (skim) milk. Fat-free, low-fat, or reduced-fat cheeses. Nonfat, low-sodium ricotta or cottage cheese. Low-fat or nonfat yogurt. Low-fat, low-sodium cheese. Fats and oils Soft margarine without trans fats. Vegetable oil. Low-fat, reduced-fat, or light mayonnaise and salad dressings (reduced-sodium). Canola, safflower, olive, soybean, and sunflower oils. Avocado. Seasoning and other foods Herbs. Spices. Seasoning mixes without salt. Unsalted popcorn and pretzels. Fat-free sweets. What foods are not recommended? The items listed may not be a complete list. Talk with your dietitian about what dietary choices are best for you. Grains Baked goods made with fat, such as croissants, muffins, or some breads. Dry pasta or rice meal packs. Vegetables Creamed or fried vegetables. Vegetables in a cheese sauce. Regular canned vegetables (not low-sodium or reduced-sodium). Regular canned tomato sauce and paste (not low-sodium or reduced-sodium).  Regular tomato and vegetable juice (not low-sodium or reduced-sodium). Angie Fava. Olives. Fruits Canned fruit in a light or heavy syrup. Fried fruit. Fruit in cream or butter sauce. Meat and other protein foods Fatty cuts of meat. Ribs. Fried meat. Berniece Salines. Sausage. Bologna and other processed lunch meats. Salami. Fatback. Hotdogs. Bratwurst. Salted nuts and seeds. Canned beans with added salt. Canned or smoked fish. Whole eggs or egg yolks. Chicken or [place] with skin. Dairy Whole or 2% milk, cream, and half-and-half. Whole or full-fat cream cheese. Whole-fat or sweetened yogurt. Full-fat cheese. Nondairy creamers. Whipped toppings. Processed cheese and cheese spreads. Fats and oils Butter. Stick margarine. Lard. Shortening. Ghee. Bacon fat. Tropical oils, such as coconut, palm kernel, or palm oil. Seasoning and other foods Salted popcorn and pretzels. Onion salt, garlic salt, seasoned salt, table salt, and sea salt. Worcestershire sauce. Tartar sauce. Barbecue sauce. Teriyaki sauce. Soy sauce, including reduced-sodium. Steak sauce. Canned and packaged gravies. Fish sauce. Oyster sauce. Cocktail sauce. Horseradish that you find on the shelf. Ketchup. Mustard. Meat flavorings and tenderizers. Bouillon cubes. Hot sauce and Tabasco sauce. Premade or packaged marinades. Premade or packaged taco seasonings. Relishes. Regular salad dressings. Where to find more information:  National Heart, Lung, and [place]: https://wilson-eaton.com/  American Heart Association:  www.heart.org Summary  The DASH eating plan is a healthy eating plan that has been shown to reduce high blood pressure (hypertension). It may also reduce your risk for type 2 diabetes, heart disease, and stroke.  With the DASH eating plan, you should limit salt (sodium) intake to 2,300 mg a day. If you have hypertension, you may need to reduce your sodium intake to 1,500 mg a day.  When on the DASH eating plan, aim to eat more fresh fruits and  vegetables, whole grains, lean proteins, low-fat dairy, and heart-healthy fats.  Work with your health care provider or diet and nutrition specialist (dietitian) to adjust your eating plan to your individual calorie needs. This information is not intended to replace advice given to you by your health care provider. Make sure you discuss any questions you have with your health care provider. Document Released: 08/22/2011 Document Revised: 08/15/2017 Document Reviewed: 08/26/2016 Elsevier Patient Education  2020 [employer].

## 2020-10-24 ENCOUNTER — Telehealth: Payer: Self-pay | Admitting: Licensed Clinical Social Worker

## 2020-10-24 NOTE — Progress Notes (Signed)
Heart and Vascular Care Navigation  10/24/2020  Miguel Brady [birth year] [id number]  Reason for Referral:  Medication financial challenges, weight management                                                                                                   Assessment: LCSW received referral for pt from [place]. She shared that pt having challenges with finances (specifically related to his medications). She also mentions pt has goals related to his weight. LCSW reached out to pt at [phone]. Introduced self, role, reason for visit. Pt confirms home address, PCP and his emergency contact remains his mother. He lives alone with his dogs, he confirms his Medicaid coverage and that he still drives himself to appointments/the store etc.   I shared the concerns relayed to me yesterday. Pt states that he has a few medications that can be pricier. Given that pt has Medicaid coverage unfortunately many assistance programs do not exist. I recommended that if he was having challenges w/ medication costs that he could ask for Dr. Oval Linsey or Dr. Dorthy Cooler (PCP) to send his prescriptions to [facility] or another [facility] pharmacy. Pt states understanding, shares he is fine for now.   He recieves a monthly disability check via direct deposit and utilizes that to cover his mortgage. He also is aware of, and applies for, LIEAP to assist with energy costs.   LCSW had discussed this pt referral regarding weight management with Avelino Leeds, Health Coach and she recommended that he may benefit from [facility]. I mentioned this to pt during our call and he seems a bit hesitant. I inquired if he would be open to [place] making a referral to [facility] and if after the information session/speaking with their coordinators he isnt interested he does not have to pursue this further. Pt gives permission to send referral.   Pt confirms he drives to appointments but sometimes gas costs are prohibitive  and he is interested in being enrolled in [employer] as back up. He also is aware that he can take Medicaid transportation as an additional back up service.   Pt provided with my name and number for any additional questions.   Pt also shares that due to previous medical hx his memory is "shot up," and requested I sent a text with a reminder to his phone as he keeps his phone near him.                                      HRT/VAS Care Coordination    Patients Home Cardiology Office [place] Team Social Worker   Social Worker Name: Margarito Liner [place], [phone]   Living arrangements for the past 2 months Single Family Home   Lives with: Self; Pets   Patient Current Insurance Coverage Medicaid   Patient Has Concern With Paying Medical Bills No   Does Patient Have Prescription Coverage? Yes   Home Assistive Devices/Equipment CBG  Meter; Cane (specify quad or straight); Crutches; Shower chair with back; Eyeglasses      Social History:                                                                             SDOH Screenings   Alcohol Screen: Low Risk   . Last Alcohol Screening Score (AUDIT): 0  Depression (PHQ2-9): Low Risk   . PHQ-2 Score: 0  Financial Resource Strain: Medium Risk  . Difficulty of Paying Living Expenses: Somewhat hard  Food Insecurity: No Food Insecurity  . Worried About Charity fundraiser in the Last Year: Never true  . Ran Out of Food in the Last Year: Never true  Housing: Low Risk   . Last Housing Risk Score: 0  Physical Activity: Inactive  . Days of Exercise per Week: 0 days  . Minutes of Exercise per Session: 0 min  Social Connections: Not on file  Stress: Not on file  Tobacco Use: Medium Risk  . Smoking Tobacco Use: Former Smoker  . Smokeless Tobacco Use: Never Used  Transportation Needs: No Transportation Needs  . Lack of Transportation (Medical): No  . Lack of Transportation  (Non-Medical): No    SDOH Interventions: Financial Resources:  Financial Strain Interventions: Other (Comment) (Discussed Pharmacy that may assist with copays)   Food Insecurity:  Food Insecurity Interventions: Intervention Not Indicated  Housing Insecurity:   pt able to pay mortgage, no needs identified at this time  Transportation:   Transportation Interventions: Freight forwarder Promotion Interventions:     Health Coaching Referred back to [employer] for a full assessment to see if he could benefit from her services.    Other Care Navigation Interventions:     Provided Pharmacy assistance resources  Discussed pharmacies that waive copays.    Follow-up plan:   LCSW spoke with referrals coordinator at [facility] and they requested information be faxed to [phone]. LCSW faxed that and received a confirmation sheet. I also sent referral through to Transportation Services for pt to be enrolled if needed moving forward. I mailed my card and Transportation Services card to pt and texted him my name and number and the number for [facility]. I also gave verbal referral for additional concerns noted by Dr. Oval Linsey related to [place] and will send formal staff message to [facility].

## 2020-10-25 ENCOUNTER — Telehealth: Payer: Self-pay

## 2020-10-25 DIAGNOSIS — Z0000 Encounter for general adult medical examination without abnormal findings: Secondary | ICD-10-CM

## 2020-10-25 NOTE — Telephone Encounter (Signed)
Called patient to discuss health coaching for smoking cessation. Patient stated that he is waiting on his surgery date to quit cold [place]. Patient is interested in quitting and would like Care Guide to hold him accountable but when it's closer to that time. Patient also has questions about his medications. Informed patient that a message would be sent to pharmacy for them to contact him regarding his concern.

## 2020-10-26 ENCOUNTER — Telehealth: Payer: Self-pay | Admitting: Cardiovascular Disease

## 2020-10-26 NOTE — Telephone Encounter (Signed)
Miguel Brady states he is wanting to arrange a nurse aid to come out to his home to assist him. He is requesting a callback to arrange setting it up. Please advise.

## 2020-10-26 NOTE — Telephone Encounter (Signed)
Left message to call back  

## 2020-10-27 ENCOUNTER — Telehealth: Payer: Self-pay

## 2020-10-27 NOTE — Telephone Encounter (Signed)
Called pt reference referral to PREP.  Explained program to pt as well as times/locations available.  Prefers evening class.  Can do Delorise Jackson T/TH [phone].  Requested information sent via text for reminder Intake scheduled for 2/15 at 5pm.

## 2020-10-30 ENCOUNTER — Telehealth: Payer: Self-pay | Admitting: Pharmacist Clinician (PhC)/ Clinical Pharmacy Specialist

## 2020-10-30 NOTE — Telephone Encounter (Signed)
LMOM for patient to return call.  Indicated in [place] that he had a question about his medications.

## 2020-11-01 ENCOUNTER — Other Ambulatory Visit: Payer: Self-pay

## 2020-11-01 ENCOUNTER — Ambulatory Visit (INDEPENDENT_AMBULATORY_CARE_PROVIDER_SITE_OTHER): Payer: Medicaid Other | Admitting: Podiatry

## 2020-11-01 DIAGNOSIS — E11621 Type 2 diabetes mellitus with foot ulcer: Secondary | ICD-10-CM | POA: Diagnosis not present

## 2020-11-01 DIAGNOSIS — L97509 Non-pressure chronic ulcer of other part of unspecified foot with unspecified severity: Secondary | ICD-10-CM

## 2020-11-01 DIAGNOSIS — Z794 Long term (current) use of insulin: Secondary | ICD-10-CM | POA: Diagnosis not present

## 2020-11-01 DIAGNOSIS — L97922 Non-pressure chronic ulcer of unspecified part of left lower leg with fat layer exposed: Secondary | ICD-10-CM

## 2020-11-01 NOTE — Telephone Encounter (Signed)
Spoke with patient.  He has some confusion about whether he should be taking potassium supplement.   Has continued to take since seeing Dr. Oval Linsey last week.   Advised that he continue it for now, and when we get the results of the metabolic panel that was ordered, we will know if he needs to continue or not.   Patient also noted that he gets some confusion about which meds to take and which to stop.  Suggested when he comes to [facility] for his follow up, he bring ALL medications and we will go thru everything to be sure he is understanding of what to take.   Patient voiced thanks and will come in later this week or early next to check metabolic panel.

## 2020-11-01 NOTE — Telephone Encounter (Signed)
Will keep in traige to make everyone aware, if patient calls back in. Thanks!

## 2020-11-01 NOTE — Telephone Encounter (Signed)
Called patient, LVM to call back to discuss.  Left call back number.   

## 2020-11-01 NOTE — Telephone Encounter (Signed)
If pt interested in [facility] will need physician order for what skilled need the RN would assist with (medication management, disease education etc) sent to a local [place]. If he is looking for personal care services (an aide for bathing, cleaning, etc) then that can be sent as directed by the papers I provided you directly to [facility] and they will evaluate his needs and arrange them for pt. Hopefully that was helpful, if I am able to get in touch with him I will let you know.   Westley Hummer, MSW, [place]  [phone]

## 2020-11-02 ENCOUNTER — Encounter: Payer: Self-pay | Admitting: Podiatry

## 2020-11-02 NOTE — Progress Notes (Signed)
Subjective:  Patient ID: Miguel Brady, male    DOB: 08/31/1961,  MRN: [id number]  Chief Complaint  Patient presents with  . Foot Ulcer    Left leg ulcer. PT stated that he is doing okay    60 y.o. male presents for wound care.  Patient presents with follows up with left leg ulcer with fat layer exposed.  Patient states been doing some Betadine wet-to-dry dressing changes he is a diabetic with last A1c of 7.0.  He denies any other acute complaints.  He would like to quit discuss next options.   Review of Systems: Negative except as noted in the HPI. Denies N/V/F/Ch.  Past Medical History:  Diagnosis Date  . Diabetes mellitus   . GERD 05/05/2007  . Headache(784.0)   . Hyperlipidemia   . Hypertension   . 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 Obesity   . Preoperative evaluation of a medical condition to rule out surgical contraindications (TAR required) 10/23/2020  . 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  no inhaler  . Substance abuse ([place])    ETOH  . TOBACCO ABUSE 12/23/2008  . TRIGGER FINGER 05/05/2007  . Tuberculosis    pos PPD    Current Outpatient Medications:  .  B-D 3CC LUER-LOK SYR 22GX1" 22G X 1" 3 ML MISC, See admin instructions., Disp: , Rfl:  .  carvedilol (COREG) 25 MG tablet, Take 1 tablet (25 mg total) by mouth 2 (two) times daily., Disp: 180 tablet, Rfl: 3 .  chlorthalidone (HYGROTON) 25 MG tablet, Take 1 tablet (25 mg total) by mouth daily., Disp: 90 tablet, Rfl: 3 .  cholecalciferol (VITAMIN D) 1000 UNITS tablet, Take 1,000 Units by mouth at bedtime. , Disp: , Rfl:  .  diphenhydrAMINE (SOMINEX) 25 MG tablet, Take 25 mg by mouth daily as needed for allergies or sleep., Disp: , Rfl:  .  gabapentin (NEURONTIN) 300 MG capsule, Take 900 mg by mouth 3 (three) times daily., Disp: , Rfl:  .  GRALISE 600 MG TABS, Take  600-1,200 mg by mouth at bedtime. , Disp: , Rfl: 4 .  lisinopril (ZESTRIL) 40 MG tablet, Take 40 mg by mouth daily., Disp: , Rfl:  .  metFORMIN (GLUCOPHAGE) 1000 MG tablet, Take 1,000 mg by mouth at bedtime., Disp: , Rfl: 1 .  methocarbamol (ROBAXIN) 500 MG tablet, Take 500 mg by mouth 4 (four) times daily., Disp: , Rfl:  .  naproxen (NAPROSYN) 500 MG tablet, Take 1 tablet (500 mg total) by mouth 2 (two) times daily as needed for mild pain, moderate pain or headache (TAKE WITH MEALS.)., Disp: 20 tablet, Rfl: 0 .  oxyCODONE-acetaminophen (PERCOCET) 10-325 MG tablet, Take 1 tablet by mouth every 6 (six) hours as needed., Disp: , Rfl:  .  pantoprazole (PROTONIX) 40 MG tablet, Take 40 mg by mouth at bedtime. , Disp: , Rfl:  .  potassium chloride (K-DUR,KLOR-CON) 10 MEQ tablet, Take 10 mEq by mouth at bedtime. , Disp: , Rfl: 0 .  pravastatin (PRAVACHOL) 20 MG tablet, Take 20 mg by mouth at bedtime., Disp: , Rfl: 1 .  sildenafil (VIAGRA) 100 MG tablet, Take 100 mg by mouth daily as needed for erectile dysfunction., Disp: , Rfl:  .  spironolactone (ALDACTONE) 25 MG tablet, Take 1 tablet (25 mg total) by mouth daily., Disp:  90 tablet, Rfl: 3 .  testosterone cypionate (DEPOTESTOSTERONE CYPIONATE) 200 MG/ML injection, Inject 1 mL into the muscle every 28 (twenty-eight) days., Disp: , Rfl: 0  Social History   Tobacco Use  Smoking Status Former Smoker  . Packs/day: 0.50  . Years: 30.00  . Pack years: 15.00  . Types: Cigarettes  . Start date: 06/25/2020  Smokeless Tobacco Never Used    Allergies  Allergen Reactions  . Shellfish Allergy Anaphylaxis  . Iodine Rash  . Penicillins Itching    Has patient had a PCN reaction causing immediate rash, facial/tongue/throat swelling, SOB or lightheadedness with hypotension: No Has patient had a PCN reaction causing severe rash involving mucus membranes or skin necrosis: No Has patient had a PCN reaction that required hospitalization No Has patient had a PCN  reaction occurring within the last 10 years: No If all of the above answers are "NO", then may proceed with Cephalosporin use.   Objective:  There were no vitals filed for this visit. There is no height or weight on file to calculate BMI. Constitutional Well developed. Well nourished.  Vascular Dorsalis pedis pulses palpable bilaterally. Posterior tibial pulses palpable bilaterally. Capillary refill normal to all digits.  No cyanosis or clubbing noted. Pedal hair growth normal.  Neurologic Normal speech. Oriented to person, place, and time. Protective sensation absent  Dermatologic Wound Location: Left leg ulcer with fat layer exposed.  Does not probe down to bone.  No purulent drainage expressed.  No malodor present. Wound Base: Mixed Granular/Fibrotic Peri-wound: Calloused Exudate: Scant/small amount Serosanguinous exudate Wound Measurements: -See below  Orthopedic: No pain to palpation either foot.   Radiographs: None Assessment:   1. Type 2 diabetes mellitus with foot ulcer, with long-term current use of insulin ([place])    Plan:  Patient was evaluated and treated and all questions answered.  Ulcer left leg ulcer with fat layer exposed -Debridement as below. -Dressed with Betadine wet-to-dry, DSD. -Continue off-loading with surgical shoe. -I discussed with the patient that he will benefit from organogenesis graft application.  I will work on the approval with it.  Patient states understanding  Procedure: Excisional Debridement of Wound~stagnant Tool: Sharp chisel blade/tissue nipper Rationale: Removal of non-viable soft tissue from the wound to promote healing.  Anesthesia: none Pre-Debridement Wound Measurements: 4 cm x 2.5 cm x 0.5 cm  Post-Debridement Wound Measurements: 4.2 cm x 2.6 cm x 0.5 cm  Type of Debridement: Sharp Excisional Tissue Removed: Non-viable soft tissue Blood loss: Minimal (<50cc) Depth of Debridement: subcutaneous tissue. Technique: Sharp  excisional debridement to bleeding, viable wound base.  Wound Progress: The wound appears to be about the same since last clinical visit Dressing: Dry, sterile, compression dressing. Disposition: Patient tolerated procedure well. Patient to return in 1 week for follow-up.  No follow-ups on file.

## 2020-11-08 ENCOUNTER — Telehealth: Payer: Self-pay | Admitting: Cardiovascular Disease

## 2020-11-08 LAB — BASIC METABOLIC PANEL
BUN/Creatinine Ratio: 11 (ref 9–20)
BUN: 12 mg/dL (ref 6–24)
CO2: 25 mmol/L (ref 20–29)
Calcium: 9.1 mg/dL (ref 8.7–10.2)
Chloride: 98 mmol/L (ref 96–106)
Creatinine, Ser: 1.09 mg/dL (ref 0.76–1.27)
GFR calc Af Amer: 85 mL/min/{1.73_m2} (ref >59)
GFR calc non Af Amer: 74 mL/min/{1.73_m2} (ref >59)
Glucose: 115 mg/dL — ABNORMAL HIGH (ref 65–99)
Potassium: 4.1 mmol/L (ref 3.5–5.2)
Sodium: 138 mmol/L (ref 134–144)

## 2020-11-08 NOTE — Telephone Encounter (Signed)
New Message:     Please call, concerning his Sleep Study, he says he have a lot of obstacles.

## 2020-11-09 NOTE — Telephone Encounter (Signed)
Patient is scheduled for CPAP Titration on 11/12/20. Patient understands his titration study will be done at [facility] sleep lab. Patient understands he will receive a letter in a week or so detailing appointment, date, time, and location. Patient understands to call if he does not receive the letter  in a timely manner. Patient agrees with treatment and thanked me for call.

## 2020-11-12 ENCOUNTER — Other Ambulatory Visit: Payer: Self-pay

## 2020-11-12 ENCOUNTER — Encounter (HOSPITAL_BASED_OUTPATIENT_CLINIC_OR_DEPARTMENT_OTHER): Payer: Medicaid Other | Admitting: Cardiovascular Disease

## 2020-11-12 ENCOUNTER — Ambulatory Visit (HOSPITAL_BASED_OUTPATIENT_CLINIC_OR_DEPARTMENT_OTHER): Payer: Medicaid Other | Attending: Cardiovascular Disease | Admitting: Cardiovascular Disease

## 2020-11-12 DIAGNOSIS — G4733 Obstructive sleep apnea (adult) (pediatric): Secondary | ICD-10-CM | POA: Diagnosis present

## 2020-11-14 NOTE — Telephone Encounter (Signed)
See recent telephone notes

## 2020-11-15 ENCOUNTER — Ambulatory Visit (INDEPENDENT_AMBULATORY_CARE_PROVIDER_SITE_OTHER): Payer: Medicaid Other | Admitting: Podiatry

## 2020-11-15 ENCOUNTER — Other Ambulatory Visit: Payer: Self-pay

## 2020-11-15 DIAGNOSIS — L97522 Non-pressure chronic ulcer of other part of left foot with fat layer exposed: Secondary | ICD-10-CM | POA: Diagnosis not present

## 2020-11-15 DIAGNOSIS — E13622 Other specified diabetes mellitus with other skin ulcer: Secondary | ICD-10-CM | POA: Diagnosis not present

## 2020-11-15 DIAGNOSIS — E11621 Type 2 diabetes mellitus with foot ulcer: Secondary | ICD-10-CM | POA: Diagnosis not present

## 2020-11-15 DIAGNOSIS — L97922 Non-pressure chronic ulcer of unspecified part of left lower leg with fat layer exposed: Secondary | ICD-10-CM

## 2020-11-15 DIAGNOSIS — L98499 Non-pressure chronic ulcer of skin of other sites with unspecified severity: Secondary | ICD-10-CM

## 2020-11-15 NOTE — Progress Notes (Signed)
[facility] PREP Weekly Session   Patient Details  Name: CARMEN VALLECILLO MRN: [id number] Date of Birth: [birth year] Age: 60 y.o. PCP: Lujean Amel, MD  There were no vitals filed for this visit.   [facility] Weekly seesion - 11/15/20 1400      Weekly Session   Topic Discussed Importance of resistance training;Other ways to be active    Minutes exercised this week --   Not recorded   Classes attended to date [place] 11/15/2020, 2:39 PM

## 2020-11-16 ENCOUNTER — Ambulatory Visit (HOSPITAL_COMMUNITY)
Admission: RE | Admit: 2020-11-16 | Discharge: 2020-11-16 | Disposition: A | Discharge status: Home / Self Care | Payer: Medicaid Other | Source: Ambulatory Visit | Attending: Cardiovascular Disease | Admitting: Cardiovascular Disease

## 2020-11-16 DIAGNOSIS — I10 Essential (primary) hypertension: Secondary | ICD-10-CM

## 2020-11-16 DIAGNOSIS — Z01818 Encounter for other preprocedural examination: Secondary | ICD-10-CM | POA: Diagnosis not present

## 2020-11-16 DIAGNOSIS — E7800 Pure hypercholesterolemia, unspecified: Secondary | ICD-10-CM | POA: Diagnosis present

## 2020-11-16 MED ORDER — TECHNETIUM TC 99M TETROFOSMIN IV KIT
30.9000 | PACK | Freq: Once | INTRAVENOUS | Status: AC | PRN
  Administered 2020-11-16: 30.9 via INTRAVENOUS
  Filled 2020-11-16: qty 31

## 2020-11-16 MED ORDER — TECHNETIUM TC 99M TETROFOSMIN IV KIT
9.9000 | PACK | Freq: Once | INTRAVENOUS | Status: DC | PRN
  Filled 2020-11-16: qty 10

## 2020-11-16 MED ORDER — AMINOPHYLLINE 25 MG/ML IV SOLN
100.0000 mg | Freq: Once | INTRAVENOUS | Status: AC
  Administered 2020-11-16: 100 mg via INTRAVENOUS

## 2020-11-16 MED ORDER — REGADENOSON 0.4 MG/5ML IV SOLN
0.4000 mg | Freq: Once | INTRAVENOUS | Status: AC
  Administered 2020-11-16: 0.4 mg via INTRAVENOUS

## 2020-11-17 ENCOUNTER — Ambulatory Visit (HOSPITAL_COMMUNITY)
Admission: RE | Admit: 2020-11-17 | Discharge: 2020-11-17 | Disposition: A | Discharge status: Home / Self Care | Payer: Medicaid Other | Source: Ambulatory Visit | Attending: Cardiology | Admitting: Cardiology

## 2020-11-17 ENCOUNTER — Other Ambulatory Visit: Payer: Self-pay

## 2020-11-17 ENCOUNTER — Encounter: Payer: Self-pay | Admitting: Podiatry

## 2020-11-17 LAB — MYOCARDIAL PERFUSION IMAGING
LV dias vol: 142 mL (ref 62–150)
LV sys vol: 83 mL
Peak HR: 100 {beats}/min
Rest HR: 62 {beats}/min
SDS: 4
SRS: 0
SSS: 4
TID: 1.16

## 2020-11-17 MED ORDER — TECHNETIUM TC 99M TETROFOSMIN IV KIT
29.3000 | PACK | Freq: Once | INTRAVENOUS | Status: AC | PRN
  Administered 2020-11-17: 29.3 via INTRAVENOUS

## 2020-11-17 NOTE — Progress Notes (Addendum)
Subjective:  Patient ID: Miguel Brady, male    DOB: [birth year],  MRN: [id number]  Chief Complaint  Patient presents with  . Foot Ulcer    Left leg ulcer. PT stated that he is having pain with that leg    60 y.o. male presents for wound care.  Patient presents with follows up with left leg ulcer with fat layer exposed.  Patient is here for graft application on a weekly basis.   Review of Systems: Negative except as noted in the HPI. Denies N/V/F/Ch.  Past Medical History:  Diagnosis Date  . Diabetes mellitus   . GERD 05/05/2007  . Headache(784.0)   . Hyperlipidemia   . Hypertension   . 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 Obesity   . Preoperative evaluation of a medical condition to rule out surgical contraindications (TAR required) 10/23/2020  . 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  no inhaler  . Substance abuse ([place])    ETOH  . TOBACCO ABUSE 12/23/2008  . TRIGGER FINGER 05/05/2007  . Tuberculosis    pos PPD    Current Outpatient Medications:  .  B-D 3CC LUER-LOK SYR 22GX1" 22G X 1" 3 ML MISC, See admin instructions., Disp: , Rfl:  .  carvedilol (COREG) 25 MG tablet, Take 1 tablet (25 mg total) by mouth 2 (two) times daily., Disp: 180 tablet, Rfl: 3 .  chlorthalidone (HYGROTON) 25 MG tablet, Take 1 tablet (25 mg total) by mouth daily., Disp: 90 tablet, Rfl: 3 .  cholecalciferol (VITAMIN D) 1000 UNITS tablet, Take 1,000 Units by mouth at bedtime. , Disp: , Rfl:  .  diphenhydrAMINE (SOMINEX) 25 MG tablet, Take 25 mg by mouth daily as needed for allergies or sleep., Disp: , Rfl:  .  gabapentin (NEURONTIN) 300 MG capsule, Take 900 mg by mouth 3 (three) times daily., Disp: , Rfl:  .  GRALISE 600 MG TABS, Take 600-1,200 mg by mouth at bedtime. , Disp: , Rfl: 4 .  lisinopril (ZESTRIL) 40 MG tablet, Take 40 mg by mouth daily., Disp: ,  Rfl:  .  metFORMIN (GLUCOPHAGE) 1000 MG tablet, Take 1,000 mg by mouth at bedtime., Disp: , Rfl: 1 .  methocarbamol (ROBAXIN) 500 MG tablet, Take 500 mg by mouth 4 (four) times daily., Disp: , Rfl:  .  naproxen (NAPROSYN) 500 MG tablet, Take 1 tablet (500 mg total) by mouth 2 (two) times daily as needed for mild pain, moderate pain or headache (TAKE WITH MEALS.)., Disp: 20 tablet, Rfl: 0 .  oxyCODONE-acetaminophen (PERCOCET) 10-325 MG tablet, Take 1 tablet by mouth every 6 (six) hours as needed., Disp: , Rfl:  .  pantoprazole (PROTONIX) 40 MG tablet, Take 40 mg by mouth at bedtime. , Disp: , Rfl:  .  potassium chloride (K-DUR,KLOR-CON) 10 MEQ tablet, Take 10 mEq by mouth at bedtime. , Disp: , Rfl: 0 .  pravastatin (PRAVACHOL) 20 MG tablet, Take 20 mg by mouth at bedtime., Disp: , Rfl: 1 .  sildenafil (VIAGRA) 100 MG tablet, Take 100 mg by mouth daily as needed for erectile dysfunction., Disp: , Rfl:  .  spironolactone (ALDACTONE) 25 MG tablet, Take 1 tablet (25 mg total) by mouth daily., Disp: 90 tablet, Rfl: 3 .  testosterone cypionate (DEPOTESTOSTERONE CYPIONATE) 200 MG/ML injection, Inject 1 mL into the muscle every 28 (  twenty-eight) days., Disp: , Rfl: 0  Social History   Tobacco Use  Smoking Status Former Smoker  . Packs/day: 0.50  . Years: 30.00  . Pack years: 15.00  . Types: Cigarettes  . Start date: 06/25/2020  Smokeless Tobacco Never Used    Allergies  Allergen Reactions  . Shellfish Allergy Anaphylaxis  . Iodine Rash  . Penicillins Itching    Has patient had a PCN reaction causing immediate rash, facial/tongue/throat swelling, SOB or lightheadedness with hypotension: No Has patient had a PCN reaction causing severe rash involving mucus membranes or skin necrosis: No Has patient had a PCN reaction that required hospitalization No Has patient had a PCN reaction occurring within the last 10 years: No If all of the above answers are "NO", then may proceed with Cephalosporin  use.   Objective:  There were no vitals filed for this visit. There is no height or weight on file to calculate BMI. Constitutional Well developed. Well nourished.  Vascular Dorsalis pedis pulses palpable bilaterally. Posterior tibial pulses palpable bilaterally. Capillary refill normal to all digits.  No cyanosis or clubbing noted. Pedal hair growth normal.  Neurologic Normal speech. Oriented to person, place, and time. Protective sensation absent  Dermatologic Wound Location: Left leg ulcer with fat layer exposed.  Does not probe down to bone.  No purulent drainage expressed.  No malodor present. Wound Base: Mixed Granular/Fibrotic Peri-wound: Calloused Exudate: Scant/small amount Serosanguinous exudate Wound Measurements: -See below  Orthopedic: No pain to palpation either foot.   Radiographs: None Assessment:   1. Skin ulcer due to secondary diabetes mellitus ([place])    Plan:  Patient was evaluated and treated and all questions answered.  Ulcer left leg ulcer with fat layer exposed -Debridement as below. -Dressed with Betadine wet-to-dry, DSD. -Continue off-loading with surgical shoe. -We will continue to apply organogenesis graft application until resolve meant of the wound.  Application of synthetic skin graft/substitute  Name: Apligraf Lot [id number] expiration date 2020-11-23 Usage: Graft was applied directly to the listed above wound site and secured with Adaptic, kerlix, Ace bandage. Waste: No graft material was wasted and was applied in its entirety.   Procedure: Excisional Debridement of Wound~stagnant Tool: Sharp chisel blade/tissue nipper Rationale: Removal of non-viable soft tissue from the wound to promote healing.  Anesthesia: none Pre-Debridement Wound Measurements: 2 cm x 1.5 cm Post-Debridement Wound Measurements: 2 cm x 1.5 cm Type of Debridement: Sharp Excisional Tissue Removed: Non-viable soft tissue Blood loss: Minimal (<50cc) Depth of  Debridement: subcutaneous tissue. Technique: Sharp excisional debridement to bleeding, viable wound base.  Wound Progress: The wound appears to be about the same since last clinical visit Dressing: Dry, sterile, compression dressing. Disposition: Patient tolerated procedure well. Patient to return in 1 week for follow-up.  No follow-ups on file.

## 2020-11-21 DIAGNOSIS — I10 Essential (primary) hypertension: Secondary | ICD-10-CM

## 2020-11-22 NOTE — Progress Notes (Signed)
[facility] PREP Progress Report  Late entry for PREP Intake done on 11/07/20 Patient Details  Name: Miguel Brady MRN: [id number] Date of Birth: May 03, 1961 Age: 60 y.o. PCP: Lujean Amel, MD  Vitals:   11/07/20 1900  BP: 130/87  SpO2: 94%  Weight: (!) 301 lb 6.4 oz (136.7 kg)      [facility] Eval - 11/22/20 1600      Referral    Referring Provider Oval Linsey    Reason for referral Inactivity;Hypertension;Obesitity/Overweight   Prehab for surgery   Program Start Date 11/07/20   T/TH 615p-730p x 12 wks     Measurement   Neck measurement 17 Inches    Waist Circumference 64 inches    Body fat 43.4 percent      Information for Trainer   Goals BP under control so he can have surgery    Current Exercise None    Orthopedic Concerns L hip, pelvis fx, multiple trauma    Pertinent Medical History DM, foot ulcer, HTN    Current Barriers Pain    Restrictions/Precautions Diabetic snack before exercise;Fall risk;Assistive device    Medications that affect exercise Medication causing dizziness/drowsiness      Timed Up and Go (TUGS)   Timed Up and Go Moderate risk 10-12 seconds          Past Medical History:  Diagnosis Date  . Diabetes mellitus   . GERD 05/05/2007  . Headache(784.0)   . Hyperlipidemia   . Hypertension   . 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 Obesity   . Preoperative evaluation of a medical condition to rule out surgical contraindications (TAR required) 10/23/2020  . 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  no inhaler  . Substance abuse ([place])    ETOH  . TOBACCO ABUSE 12/23/2008  . TRIGGER FINGER 05/05/2007  . Tuberculosis    pos PPD   Past Surgical History:  Procedure Laterality Date  . CHEST TUBE INSERTION  04/03/2012   Procedure: CHEST TUBE INSERTION;  Surgeon: Zenovia Jarred, MD;  Location: [facility];  Service:  General;  Laterality: Left;  . CHOLECYSTECTOMY  07/28/2012   Procedure: LAPAROSCOPIC CHOLECYSTECTOMY;  Surgeon: Harl Bowie, MD;  Location: [facility];  Service: General;  Laterality: N/A;  . EXTERNAL FIXATION LEG  04/03/2012   Procedure: EXTERNAL FIXATION LEG;  Surgeon: Rozanna Box, MD;  Location: [facility];  Service: Orthopedics;  Laterality: Left;  Left femur  . EXTERNAL FIXATION PELVIS  04/03/2012   Procedure: EXTERNAL FIXATION PELVIS;  Surgeon: Rozanna Box, MD;  Location: [facility];  Service: Orthopedics;;  . FEMUR IM NAIL  04/07/2012   Procedure: INTRAMEDULLARY (IM) NAIL FEMORAL;  Surgeon: Rozanna Box, MD;  Location: [facility];  Service: Orthopedics;  Laterality: Left;  . FLEXIBLE BRONCHOSCOPY  04/07/2012   Procedure: FLEXIBLE BRONCHOSCOPY;  Surgeon: Zenovia Jarred, MD;  Location: [facility];  Service: General;;  START TIME=1645 END TIME=1700  . INCISION AND DRAINAGE OF WOUND  04/03/2012   Procedure: IRRIGATION AND DEBRIDEMENT WOUND;  Surgeon: Otilio Connors, MD;  Location: [facility];  Service: Neurosurgery;  Laterality: N/A;  Frontal.  . ORIF PATELLA  04/07/2012   Procedure: OPEN REDUCTION INTERNAL (ORIF) FIXATION PATELLA;  Surgeon: Rozanna Box, MD;  Location: [facility];  Service: Orthopedics;  Laterality: Left;  .  ORIF PELVIC FRACTURE  04/07/2012   Procedure: OPEN REDUCTION INTERNAL FIXATION (ORIF) PELVIC FRACTURE;  Surgeon: Rozanna Box, MD;  Location: [facility];  Service: Orthopedics;  Laterality: N/A;  Right and left sacroiliac screw pinning,Irrigation and debridebridement open tibia and femur,removal external fixator.  . TIBIA IM NAIL INSERTION  04/07/2012   Procedure: INTRAMEDULLARY (IM) NAIL TIBIAL;  Surgeon: Rozanna Box, MD;  Location: [place];  Service: Orthopedics;  Laterality: Left;   Social History   Tobacco Use  Smoking Status Former Smoker  . Packs/day: 0.50  . Years: 30.00  . Pack years: 15.00  . Types: Cigarettes  . Start date: 06/25/2020  Smokeless Tobacco Never Used     How fit and strong you are not completed    Barnett Hatter 11/22/2020, 4:39 PM

## 2020-11-22 NOTE — Progress Notes (Signed)
[facility] PREP Weekly Session   Patient Details  Name: Miguel Brady MRN: [id number] Date of Birth: 1961-06-20 Age: 60 y.o. PCP: Lujean Amel, MD  Vitals:   11/21/20 1815  Weight: (!) 303 lb 3.2 oz (137.5 kg)     [facility] Weekly seesion - 11/22/20 1000      Weekly Session   Topic Discussed Healthy eating tips    Minutes exercised this week 180 minutes    Classes attended to date [place] 11/22/2020, 10:10 AM

## 2020-11-24 ENCOUNTER — Ambulatory Visit: Payer: Medicaid Other | Admitting: Podiatry

## 2020-11-24 ENCOUNTER — Other Ambulatory Visit: Payer: Self-pay

## 2020-11-24 DIAGNOSIS — E13622 Other specified diabetes mellitus with other skin ulcer: Secondary | ICD-10-CM

## 2020-11-24 DIAGNOSIS — E11621 Type 2 diabetes mellitus with foot ulcer: Secondary | ICD-10-CM

## 2020-11-24 DIAGNOSIS — L97522 Non-pressure chronic ulcer of other part of left foot with fat layer exposed: Secondary | ICD-10-CM | POA: Diagnosis not present

## 2020-11-24 DIAGNOSIS — L97922 Non-pressure chronic ulcer of unspecified part of left lower leg with fat layer exposed: Secondary | ICD-10-CM

## 2020-11-24 DIAGNOSIS — L98499 Non-pressure chronic ulcer of skin of other sites with unspecified severity: Secondary | ICD-10-CM

## 2020-11-25 ENCOUNTER — Encounter (HOSPITAL_BASED_OUTPATIENT_CLINIC_OR_DEPARTMENT_OTHER): Payer: Self-pay | Admitting: Cardiovascular Disease

## 2020-11-25 NOTE — Procedures (Signed)
Patient Name: Miguel Brady, Miguel Brady Date: 11/12/2020 Gender: Male D.O.B: 12/18/1960 Age (years): 59 Referring Provider: Shelva Majestic MD, ABSM Height (inches): 72 Interpreting Physician: Shelva Majestic MD, ABSM Weight (lbs): 301 RPSGT: Zadie Rhine BMI: 41 MRN: [id number] Neck Size: 21.00  CLINICAL INFORMATION The patient is referred for a CPAP titration to treat sleep apnea.  Date of HST:  09/06/2020: AHI 37/h; O2 nadir 68%.  SLEEP STUDY TECHNIQUE As per the AASM Manual for the Scoring of Sleep and Associated Events v2.3 (April 2016) with a hypopnea requiring 4% desaturations.  The channels recorded and monitored were frontal, central and occipital EEG, electrooculogram (EOG), submentalis EMG (chin), nasal and oral airflow, thoracic and abdominal wall motion, anterior tibialis EMG, snore microphone, electrocardiogram, and pulse oximetry. Continuous positive airway pressure (CPAP) was initiated at the beginning of the study and titrated to treat sleep-disordered breathing.  MEDICATIONS carvedilol (COREG) 25 MG tablet(Expired) chlorthalidone (HYGROTON) 25 MG tablet cholecalciferol (VITAMIN D) 1000 UNITS tablet diphenhydrAMINE (SOMINEX) 25 MG tablet gabapentin (NEURONTIN) 300 MG capsule GRALISE 600 MG TABS lisinopril (ZESTRIL) 40 MG tablet metFORMIN (GLUCOPHAGE) 1000 MG tablet methocarbamol (ROBAXIN) 500 MG tablet naproxen (NAPROSYN) 500 MG tablet oxyCODONE-acetaminophen (PERCOCET) 10-325 MG tablet pantoprazole (PROTONIX) 40 MG tablet potassium chloride (K-DUR,KLOR-CON) 10 MEQ tablet pravastatin (PRAVACHOL) 20 MG tablet sildenafil (VIAGRA) 100 MG tablet spironolactone (ALDACTONE) 25 MG tablet(Expired) testosterone cypionate (DEPOTESTOSTERONE CYPIONATE) 200 MG/ML injection Medications self-administered by patient taken the night of the study : NEURONTIN, METFORMIN, PERCOCET-5  TECHNICIAN COMMENTS Comments added by technician: PT HAD TWO RESTROOM VISTED. Patient had  difficulty initiating sleep. Comments added by scorer: N/A  RESPIRATORY PARAMETERS Optimal PAP Pressure (cm): 12 AHI at Optimal Pressure (/hr): 0 Overall Minimal O2 (%): 73.0 Supine % at Optimal Pressure (%): 100 Minimal O2 at Optimal Pressure (%): 86.0   SLEEP ARCHITECTURE The study was initiated at 10:23:00 PM and ended at 4:34:21 AM.  Sleep onset time was 54.0 minutes and the sleep efficiency was 56.1%%. The total sleep time was 208.5 minutes.  The patient spent 17.0%% of the night in stage N1 sleep, 67.9%% in stage N2 sleep, 0.0%% in stage N3 and 15.1% in REM.Stage REM latency was 28.5 minutes  Wake after sleep onset was 108.9. Alpha intrusion was absent. Supine sleep was 100.00%.  CARDIAC DATA The 2 lead EKG demonstrated sinus rhythm. The mean heart rate was 73.6 beats per minute. Other EKG findings include: None.  LEG MOVEMENT DATA The total Periodic Limb Movements of Sleep (PLMS) were 0. The PLMS index was 0.0. A PLMS index of <15 is considered normal in adults.  IMPRESSIONS - CPAP was initiated at 6 and was titrated to 12 cm of water: AHI 0/h; O2 nadir 86%. - Severe oxygen desaturations to a nadir of 73.0% at 8 cm of water. - No snoring was audible during this study. - No cardiac abnormalities were observed during this study. - Clinically significant periodic limb movements were not noted during this study. Arousals associated with PLMs were rare.  DIAGNOSIS - Obstructive Sleep Apnea (G47.33)  RECOMMENDATIONS - Recommend a trial of CPAP Auto therapy with EPR at 12 - 18 cm H2O with heated humidification.  A Medium size Resmed Full Face Mask AirFit F20 mask was used for the titration. - Effort should be made to optimize nasal and oropharyngeal patency. - Avoid alcohol, sedatives and other CNS depressants that may worsen sleep apnea and disrupt normal sleep architecture. - Sleep hygiene should be reviewed to assess factors that may improve sleep  quality. - Weight management  and regular exercise should be initiated or continued. - Return to Sleep Center for re-evaluation after 4 weeks of therapy  [Electronically signed] 11/25/2020 09:51 AM  Shelva Majestic MD, [facility], [place], American Board of Sleep Medicine   NPI: [id number] [place] PH: [phone]   FX: [phone] [place]

## 2020-11-28 ENCOUNTER — Encounter: Payer: Self-pay | Admitting: Podiatry

## 2020-11-28 NOTE — Progress Notes (Addendum)
Subjective:  Patient ID: Miguel Brady, male    DOB: Feb 07, 1961,  MRN: [id number]  Chief Complaint  Patient presents with  . Foot Ulcer    Left leg ulcer.    60 y.o. male presents for wound care.  Patient presents with follows up with left leg ulcer with fat layer exposed.  Patient is here for graft application on a weekly basis.   Review of Systems: Negative except as noted in the HPI. Denies N/V/F/Ch.  Past Medical History:  Diagnosis Date  . Diabetes mellitus   . GERD 05/05/2007  . Headache(784.0)   . Hyperlipidemia   . Hypertension   . 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 Obesity   . Preoperative evaluation of a medical condition to rule out surgical contraindications (TAR required) 10/23/2020  . 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  no inhaler  . Substance abuse ([place])    ETOH  . TOBACCO ABUSE 12/23/2008  . TRIGGER FINGER 05/05/2007  . Tuberculosis    pos PPD    Current Outpatient Medications:  .  B-D 3CC LUER-LOK SYR 22GX1" 22G X 1" 3 ML MISC, See admin instructions., Disp: , Rfl:  .  carvedilol (COREG) 25 MG tablet, Take 1 tablet (25 mg total) by mouth 2 (two) times daily., Disp: 180 tablet, Rfl: 3 .  chlorthalidone (HYGROTON) 25 MG tablet, Take 1 tablet (25 mg total) by mouth daily., Disp: 90 tablet, Rfl: 3 .  cholecalciferol (VITAMIN D) 1000 UNITS tablet, Take 1,000 Units by mouth at bedtime. , Disp: , Rfl:  .  diphenhydrAMINE (SOMINEX) 25 MG tablet, Take 25 mg by mouth daily as needed for allergies or sleep., Disp: , Rfl:  .  gabapentin (NEURONTIN) 300 MG capsule, Take 900 mg by mouth 3 (three) times daily., Disp: , Rfl:  .  GRALISE 600 MG TABS, Take 600-1,200 mg by mouth at bedtime. , Disp: , Rfl: 4 .  lisinopril (ZESTRIL) 40 MG tablet, Take 40 mg by mouth daily., Disp: , Rfl:  .  metFORMIN (GLUCOPHAGE) 1000 MG  tablet, Take 1,000 mg by mouth at bedtime., Disp: , Rfl: 1 .  methocarbamol (ROBAXIN) 500 MG tablet, Take 500 mg by mouth 4 (four) times daily., Disp: , Rfl:  .  naproxen (NAPROSYN) 500 MG tablet, Take 1 tablet (500 mg total) by mouth 2 (two) times daily as needed for mild pain, moderate pain or headache (TAKE WITH MEALS.)., Disp: 20 tablet, Rfl: 0 .  oxyCODONE-acetaminophen (PERCOCET) 10-325 MG tablet, Take 1 tablet by mouth every 6 (six) hours as needed., Disp: , Rfl:  .  pantoprazole (PROTONIX) 40 MG tablet, Take 40 mg by mouth at bedtime. , Disp: , Rfl:  .  potassium chloride (K-DUR,KLOR-CON) 10 MEQ tablet, Take 10 mEq by mouth at bedtime. , Disp: , Rfl: 0 .  pravastatin (PRAVACHOL) 20 MG tablet, Take 20 mg by mouth at bedtime., Disp: , Rfl: 1 .  sildenafil (VIAGRA) 100 MG tablet, Take 100 mg by mouth daily as needed for erectile dysfunction., Disp: , Rfl:  .  spironolactone (ALDACTONE) 25 MG tablet, Take 1 tablet (25 mg total) by mouth daily., Disp: 90 tablet, Rfl: 3 .  testosterone cypionate (DEPOTESTOSTERONE CYPIONATE) 200 MG/ML injection, Inject 1 mL into the muscle every 28 (twenty-eight) days., Disp: , Rfl: 0  Social History  Tobacco Use  Smoking Status Former Smoker  . Packs/day: 0.50  . Years: 30.00  . Pack years: 15.00  . Types: Cigarettes  . Start date: 06/25/2020  Smokeless Tobacco Never Used    Allergies  Allergen Reactions  . Shellfish Allergy Anaphylaxis  . Iodine Rash  . Penicillins Itching    Has patient had a PCN reaction causing immediate rash, facial/tongue/throat swelling, SOB or lightheadedness with hypotension: No Has patient had a PCN reaction causing severe rash involving mucus membranes or skin necrosis: No Has patient had a PCN reaction that required hospitalization No Has patient had a PCN reaction occurring within the last 10 years: No If all of the above answers are "NO", then may proceed with Cephalosporin use.   Objective:  There were no vitals  filed for this visit. There is no height or weight on file to calculate BMI. Constitutional Well developed. Well nourished.  Vascular Dorsalis pedis pulses palpable bilaterally. Posterior tibial pulses palpable bilaterally. Capillary refill normal to all digits.  No cyanosis or clubbing noted. Pedal hair growth normal.  Neurologic Normal speech. Oriented to person, place, and time. Protective sensation absent  Dermatologic Wound Location: Left leg ulcer with fat layer exposed.  Does not probe down to bone.  No purulent drainage expressed.  No malodor present. Wound Base: Mixed Granular/Fibrotic Peri-wound: Calloused Exudate: Scant/small amount Serosanguinous exudate Wound Measurements: -See below  Orthopedic: No pain to palpation either foot.   Radiographs: None Assessment:   1. Skin ulcer due to secondary diabetes mellitus ([place])    Plan:  Patient was evaluated and treated and all questions answered.  Ulcer left leg ulcer with fat layer exposed -Debridement as below. -Dressed with Betadine wet-to-dry, DSD. -Continue off-loading with surgical shoe. -We will continue to apply organogenesis graft application until resolve meant of the wound.  Application of synthetic skin graft/substitute  Name: Apligraf Lot [id number] expiration date 2020-12-06 Usage: Graft was applied directly to the listed above wound site and secured with Adaptic, kerlix, Ace bandage. Waste: No graft material was wasted and was applied in its entirety.   Procedure: Excisional Debridement of Wound Tool: Sharp chisel blade/tissue nipper Rationale: Removal of non-viable soft tissue from the wound to promote healing.  Anesthesia: none Pre-Debridement Wound Measurements: 2 cm x 1.2 cm Post-Debridement Wound Measurements: 2 cm x 1.2 cm Type of Debridement: Sharp Excisional Tissue Removed: Non-viable soft tissue Blood loss: Minimal (<50cc) Depth of Debridement: subcutaneous tissue. Technique: Sharp  excisional debridement to bleeding, viable wound base.  Wound Progress: The wound size is decreasing and appear to be more granulation tissue present after the application of synthetic graft Dressing: Dry, sterile, compression dressing. Disposition: Patient tolerated procedure well. Patient to return in 1 week for follow-up.  No follow-ups on file.

## 2020-11-29 NOTE — Progress Notes (Signed)
[facility] PREP Weekly Session   Patient Details  Name: Miguel Brady MRN: [id number] Date of Birth: [birth year] Age: 60 y.o. PCP: Lujean Amel, MD  There were no vitals filed for this visit.   [facility] Weekly seesion - 11/29/20 1100      Weekly Session   Topic Discussed Health habits    Minutes exercised this week 90 minutes    Classes attended to date [place] 11/29/2020, 11:46 AM

## 2020-12-04 ENCOUNTER — Telehealth: Payer: Self-pay | Admitting: *Deleted

## 2020-12-04 NOTE — Telephone Encounter (Signed)
Patient notified sleep study has been completed and order has been sent to [facility] for CPAP.he asked me if his Medicaid will cover the cost of the machine. I told him I don't know. The DME company will go over this with him once they have obtained the benefit information from [facility].  He states that "if they won't pay for it, then I am not going to get it."

## 2020-12-06 ENCOUNTER — Ambulatory Visit: Payer: Medicaid Other | Admitting: Podiatry

## 2020-12-06 ENCOUNTER — Other Ambulatory Visit: Payer: Self-pay

## 2020-12-06 DIAGNOSIS — E11622 Type 2 diabetes mellitus with other skin ulcer: Secondary | ICD-10-CM

## 2020-12-06 DIAGNOSIS — L98499 Non-pressure chronic ulcer of skin of other sites with unspecified severity: Secondary | ICD-10-CM

## 2020-12-06 DIAGNOSIS — E13622 Other specified diabetes mellitus with other skin ulcer: Secondary | ICD-10-CM

## 2020-12-06 DIAGNOSIS — L97522 Non-pressure chronic ulcer of other part of left foot with fat layer exposed: Secondary | ICD-10-CM | POA: Diagnosis not present

## 2020-12-06 DIAGNOSIS — E08621 Diabetes mellitus due to underlying condition with foot ulcer: Secondary | ICD-10-CM

## 2020-12-06 DIAGNOSIS — E11621 Type 2 diabetes mellitus with foot ulcer: Secondary | ICD-10-CM | POA: Diagnosis not present

## 2020-12-06 NOTE — Progress Notes (Signed)
[facility] PREP Weekly Session   Patient Details  Name: Miguel Brady MRN: [id number] Date of Birth: 02-Feb-1961 Age: 60 y.o. PCP: Lujean Amel, MD  There were no vitals filed for this visit.   [facility] Weekly seesion - 12/06/20 1200      Weekly Session   Topic Discussed Restaurant Eating;Other   Salt discussion/demo   Minutes exercised this week 60 minutes    Classes attended to date [place] 12/06/2020, 12:07 PM

## 2020-12-07 ENCOUNTER — Encounter: Payer: Self-pay | Admitting: Podiatry

## 2020-12-07 NOTE — Progress Notes (Addendum)
Subjective:  Patient ID: Miguel Brady, male    DOB: 1961-06-18,  MRN: [id number]  Chief Complaint  Patient presents with  . Wound Check    60 y.o. male presents for wound care.  Patient presents with follows up with left leg ulcer with fat layer exposed.  Patient is here for graft application on a weekly basis.   Review of Systems: Negative except as noted in the HPI. Denies N/V/F/Ch.  Past Medical History:  Diagnosis Date  . Diabetes mellitus   . GERD 05/05/2007  . Headache(784.0)   . Hyperlipidemia   . Hypertension   . 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 Obesity   . Preoperative evaluation of a medical condition to rule out surgical contraindications (TAR required) 10/23/2020  . 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  no inhaler  . Substance abuse ([place])    ETOH  . TOBACCO ABUSE 12/23/2008  . TRIGGER FINGER 05/05/2007  . Tuberculosis    pos PPD    Current Outpatient Medications:  .  B-D 3CC LUER-LOK SYR 22GX1" 22G X 1" 3 ML MISC, See admin instructions., Disp: , Rfl:  .  carvedilol (COREG) 25 MG tablet, Take 1 tablet (25 mg total) by mouth 2 (two) times daily., Disp: 180 tablet, Rfl: 3 .  chlorthalidone (HYGROTON) 25 MG tablet, Take 1 tablet (25 mg total) by mouth daily., Disp: 90 tablet, Rfl: 3 .  cholecalciferol (VITAMIN D) 1000 UNITS tablet, Take 1,000 Units by mouth at bedtime. , Disp: , Rfl:  .  diphenhydrAMINE (SOMINEX) 25 MG tablet, Take 25 mg by mouth daily as needed for allergies or sleep., Disp: , Rfl:  .  gabapentin (NEURONTIN) 300 MG capsule, Take 900 mg by mouth 3 (three) times daily., Disp: , Rfl:  .  GRALISE 600 MG TABS, Take 600-1,200 mg by mouth at bedtime. , Disp: , Rfl: 4 .  lisinopril (ZESTRIL) 40 MG tablet, Take 40 mg by mouth daily., Disp: , Rfl:  .  metFORMIN (GLUCOPHAGE) 1000 MG tablet, Take 1,000 mg by  mouth at bedtime., Disp: , Rfl: 1 .  methocarbamol (ROBAXIN) 500 MG tablet, Take 500 mg by mouth 4 (four) times daily., Disp: , Rfl:  .  naproxen (NAPROSYN) 500 MG tablet, Take 1 tablet (500 mg total) by mouth 2 (two) times daily as needed for mild pain, moderate pain or headache (TAKE WITH MEALS.)., Disp: 20 tablet, Rfl: 0 .  oxyCODONE-acetaminophen (PERCOCET) 10-325 MG tablet, Take 1 tablet by mouth every 6 (six) hours as needed., Disp: , Rfl:  .  pantoprazole (PROTONIX) 40 MG tablet, Take 40 mg by mouth at bedtime. , Disp: , Rfl:  .  potassium chloride (K-DUR,KLOR-CON) 10 MEQ tablet, Take 10 mEq by mouth at bedtime. , Disp: , Rfl: 0 .  pravastatin (PRAVACHOL) 20 MG tablet, Take 20 mg by mouth at bedtime., Disp: , Rfl: 1 .  sildenafil (VIAGRA) 100 MG tablet, Take 100 mg by mouth daily as needed for erectile dysfunction., Disp: , Rfl:  .  spironolactone (ALDACTONE) 25 MG tablet, Take 1 tablet (25 mg total) by mouth daily., Disp: 90 tablet, Rfl: 3 .  testosterone cypionate (DEPOTESTOSTERONE CYPIONATE) 200 MG/ML injection, Inject 1 mL into the muscle every 28 (twenty-eight) days., Disp: , Rfl: 0  Social History   Tobacco Use  Smoking Status  Former Smoker  . Packs/day: 0.50  . Years: 30.00  . Pack years: 15.00  . Types: Cigarettes  . Start date: 06/25/2020  Smokeless Tobacco Never Used    Allergies  Allergen Reactions  . Shellfish Allergy Anaphylaxis  . Iodine Rash  . Penicillins Itching    Has patient had a PCN reaction causing immediate rash, facial/tongue/throat swelling, SOB or lightheadedness with hypotension: No Has patient had a PCN reaction causing severe rash involving mucus membranes or skin necrosis: No Has patient had a PCN reaction that required hospitalization No Has patient had a PCN reaction occurring within the last 10 years: No If all of the above answers are "NO", then may proceed with Cephalosporin use.   Objective:  There were no vitals filed for this  visit. There is no height or weight on file to calculate BMI. Constitutional Well developed. Well nourished.  Vascular Dorsalis pedis pulses palpable bilaterally. Posterior tibial pulses palpable bilaterally. Capillary refill normal to all digits.  No cyanosis or clubbing noted. Pedal hair growth normal.  Neurologic Normal speech. Oriented to person, place, and time. Protective sensation absent  Dermatologic Wound Location: Left leg ulcer with fat layer exposed.  Does not probe down to bone.  No purulent drainage expressed.  No malodor present. Wound Base: Mixed Granular/Fibrotic Peri-wound: Calloused Exudate: Scant/small amount Serosanguinous exudate Wound Measurements: -See below  Orthopedic: No pain to palpation either foot.   Radiographs: None Assessment:   1. Skin ulcer due to secondary diabetes mellitus ([place])    Plan:  Patient was evaluated and treated and all questions answered.  Ulcer left leg ulcer with fat layer exposed -Debridement as below. -Dressed with Betadine wet-to-dry, DSD. -Continue off-loading with surgical shoe. -We will continue to apply organogenesis graft application until resolve meant of the wound.  Application of synthetic skin graft/substitute  Name: Apligraf Lot [id number] expiration date 2020-12-08 Usage: Graft was applied directly to the listed above wound site and secured with Adaptic, kerlix, Ace bandage. Waste: No graft material was wasted and was applied in its entirety.   Procedure: Excisional Debridement of Wound Tool: Sharp chisel blade/tissue nipper Rationale: Removal of non-viable soft tissue from the wound to promote healing.  Anesthesia: none Pre-Debridement Wound Measurements: 2 cm x 0.7 cm Post-Debridement Wound Measurements: 2 cm x 0.7 cm Type of Debridement: Sharp Excisional Tissue Removed: Non-viable soft tissue Blood loss: Minimal (<50cc) Depth of Debridement: subcutaneous tissue. Technique: Sharp excisional  debridement to bleeding, viable wound base.  Wound Progress: The wound size is decreasing and appear to be more granulation tissue present after the application of synthetic graft Dressing: Dry, sterile, compression dressing. Disposition: Patient tolerated procedure well. Patient to return in 1 week for follow-up.  No follow-ups on file.

## 2020-12-13 NOTE — Progress Notes (Signed)
[facility] Weekly Session   Patient Details  Name: Miguel Brady MRN: [id number] Date of Birth: [birth year] Age: 60 y.o. PCP: Lujean Amel, MD  Vitals:   12/12/20 1830  Weight: (!) 303 lb (137.4 kg)     [facility] Weekly seesion - 12/13/20 1100      Weekly Session   Topic Discussed Stress management and problem solving   breathwork and relaxation meditation   Minutes exercised this week --   not charted by > 60 min   Classes attended to date [place] 12/13/2020, 11:54 AM

## 2020-12-14 ENCOUNTER — Encounter: Payer: Self-pay | Admitting: Gastroenterology

## 2020-12-15 ENCOUNTER — Ambulatory Visit: Payer: Medicaid Other | Admitting: Podiatry

## 2020-12-15 ENCOUNTER — Other Ambulatory Visit: Payer: Self-pay

## 2020-12-15 DIAGNOSIS — E13622 Other specified diabetes mellitus with other skin ulcer: Secondary | ICD-10-CM

## 2020-12-15 DIAGNOSIS — E11621 Type 2 diabetes mellitus with foot ulcer: Secondary | ICD-10-CM | POA: Diagnosis not present

## 2020-12-15 DIAGNOSIS — L98499 Non-pressure chronic ulcer of skin of other sites with unspecified severity: Secondary | ICD-10-CM

## 2020-12-15 DIAGNOSIS — L97522 Non-pressure chronic ulcer of other part of left foot with fat layer exposed: Secondary | ICD-10-CM

## 2020-12-19 ENCOUNTER — Encounter: Payer: Self-pay | Admitting: Podiatry

## 2020-12-19 NOTE — Progress Notes (Addendum)
Subjective:  Patient ID: Miguel Brady, male    DOB: 10/08/1960,  MRN: [id number]  Chief Complaint  Patient presents with  . Wound Check    PT stated that he is doing okay     60 y.o. male presents for wound care.  Patient presents with follows up with left leg ulcer with fat layer exposed.  Patient is here for graft application on a weekly basis.   Review of Systems: Negative except as noted in the HPI. Denies N/V/F/Ch.  Past Medical History:  Diagnosis Date  . Diabetes mellitus   . GERD 05/05/2007  . Headache(784.0)   . Hyperlipidemia   . Hypertension   . 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 Obesity   . Preoperative evaluation of a medical condition to rule out surgical contraindications (TAR required) 10/23/2020  . 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  no inhaler  . Substance abuse ([place])    ETOH  . TOBACCO ABUSE 12/23/2008  . TRIGGER FINGER 05/05/2007  . Tuberculosis    pos PPD    Current Outpatient Medications:  .  B-D 3CC LUER-LOK SYR 22GX1" 22G X 1" 3 ML MISC, See admin instructions., Disp: , Rfl:  .  carvedilol (COREG) 25 MG tablet, Take 1 tablet (25 mg total) by mouth 2 (two) times daily., Disp: 180 tablet, Rfl: 3 .  chlorthalidone (HYGROTON) 25 MG tablet, Take 1 tablet (25 mg total) by mouth daily., Disp: 90 tablet, Rfl: 3 .  cholecalciferol (VITAMIN D) 1000 UNITS tablet, Take 1,000 Units by mouth at bedtime. , Disp: , Rfl:  .  diphenhydrAMINE (SOMINEX) 25 MG tablet, Take 25 mg by mouth daily as needed for allergies or sleep., Disp: , Rfl:  .  gabapentin (NEURONTIN) 300 MG capsule, Take 900 mg by mouth 3 (three) times daily., Disp: , Rfl:  .  GRALISE 600 MG TABS, Take 600-1,200 mg by mouth at bedtime. , Disp: , Rfl: 4 .  lisinopril (ZESTRIL) 40 MG tablet, Take 40 mg by mouth daily., Disp: , Rfl:  .  metFORMIN  (GLUCOPHAGE) 1000 MG tablet, Take 1,000 mg by mouth at bedtime., Disp: , Rfl: 1 .  methocarbamol (ROBAXIN) 500 MG tablet, Take 500 mg by mouth 4 (four) times daily., Disp: , Rfl:  .  naproxen (NAPROSYN) 500 MG tablet, Take 1 tablet (500 mg total) by mouth 2 (two) times daily as needed for mild pain, moderate pain or headache (TAKE WITH MEALS.)., Disp: 20 tablet, Rfl: 0 .  oxyCODONE-acetaminophen (PERCOCET) 10-325 MG tablet, Take 1 tablet by mouth every 6 (six) hours as needed., Disp: , Rfl:  .  pantoprazole (PROTONIX) 40 MG tablet, Take 40 mg by mouth at bedtime. , Disp: , Rfl:  .  potassium chloride (K-DUR,KLOR-CON) 10 MEQ tablet, Take 10 mEq by mouth at bedtime. , Disp: , Rfl: 0 .  pravastatin (PRAVACHOL) 20 MG tablet, Take 20 mg by mouth at bedtime., Disp: , Rfl: 1 .  sildenafil (VIAGRA) 100 MG tablet, Take 100 mg by mouth daily as needed for erectile dysfunction., Disp: , Rfl:  .  spironolactone (ALDACTONE) 25 MG tablet, Take 1 tablet (25 mg total) by mouth daily., Disp: 90 tablet, Rfl: 3 .  testosterone cypionate (DEPOTESTOSTERONE CYPIONATE) 200 MG/ML injection, Inject 1 mL into the muscle every 28 (twenty-eight) days., Disp: , Rfl:  0  Social History   Tobacco Use  Smoking Status Former Smoker  . Packs/day: 0.50  . Years: 30.00  . Pack years: 15.00  . Types: Cigarettes  . Start date: 06/25/2020  Smokeless Tobacco Never Used    Allergies  Allergen Reactions  . Shellfish Allergy Anaphylaxis  . Iodine Rash  . Penicillins Itching    Has patient had a PCN reaction causing immediate rash, facial/tongue/throat swelling, SOB or lightheadedness with hypotension: No Has patient had a PCN reaction causing severe rash involving mucus membranes or skin necrosis: No Has patient had a PCN reaction that required hospitalization No Has patient had a PCN reaction occurring within the last 10 years: No If all of the above answers are "NO", then may proceed with Cephalosporin use.   Objective:   There were no vitals filed for this visit. There is no height or weight on file to calculate BMI. Constitutional Well developed. Well nourished.  Vascular Dorsalis pedis pulses palpable bilaterally. Posterior tibial pulses palpable bilaterally. Capillary refill normal to all digits.  No cyanosis or clubbing noted. Pedal hair growth normal.  Neurologic Normal speech. Oriented to person, place, and time. Protective sensation absent  Dermatologic Wound Location: Left leg ulcer with fat layer exposed.  Does not probe down to bone.  No purulent drainage expressed.  No malodor present. Wound Base: Mixed Granular/Fibrotic Peri-wound: Calloused Exudate: Scant/small amount Serosanguinous exudate Wound Measurements: -See below  Orthopedic: No pain to palpation either foot.   Radiographs: None Assessment:   1. Skin ulcer due to secondary diabetes mellitus ([place])    Plan:  Patient was evaluated and treated and all questions answered.  Ulcer left leg ulcer with fat layer exposed -Debridement as below. -Dressed with Betadine wet-to-dry, DSD. -Continue off-loading with surgical shoe. -We will continue to apply organogenesis graft application until resolve meant of the wound. -I applied donated graft was applied in standard technique..   Procedure: Excisional Debridement of Wound Tool: Sharp chisel blade/tissue nipper Rationale: Removal of non-viable soft tissue from the wound to promote healing.  Anesthesia: none Pre-Debridement Wound Measurements: 2 cm x 0.7 cm Post-Debridement Wound Measurements: 2 cm x 0.7 cm Type of Debridement: Sharp Excisional Tissue Removed: Non-viable soft tissue Blood loss: Minimal (<50cc) Depth of Debridement: subcutaneous tissue. Technique: Sharp excisional debridement to bleeding, viable wound base.  Wound Progress: The wound size is decreasing and appear to be more granulation tissue present after the application of synthetic graft Dressing: Dry,  sterile, compression dressing. Disposition: Patient tolerated procedure well. Patient to return in 1 week for follow-up.  No follow-ups on file.

## 2020-12-20 NOTE — Progress Notes (Signed)
[facility] Weekly Session   Patient Details  Name: Miguel Brady MRN: [id number] Date of Birth: 1961-09-11 Age: 60 y.o. PCP: Lujean Amel, MD  Vitals:   12/19/20 1830  Weight: (!) 305 lb (138.3 kg)     [facility] Weekly seesion - 12/20/20 0900      Weekly Session   Topic Discussed Expectations and non-scale victories    Minutes exercised this week 180 minutes    Classes attended to date [place] 12/20/2020, 9:15 AM

## 2020-12-21 ENCOUNTER — Ambulatory Visit: Payer: Medicaid Other

## 2020-12-21 NOTE — Progress Notes (Deleted)
Patient ID: Miguel Brady                 DOB: [birth year]                      MRN: [id number]     HPI: Miguel Brady is a 60 y.o. male referred by Dr. Oval Linsey to HTN clinic. PMH includes hypertension, diabetes, hyperlipidemia, EtOH abuse, tobacco abuse,   Current HTN meds:   Previously tried:   BP goal:    Family History:   Social History:   Diet:   Exercise:   Home BP readings:   Wt Readings from Last 3 Encounters:  12/19/20 (!) 305 lb (138.3 kg)  12/12/20 (!) 303 lb (137.4 kg)  11/21/20 (!) 303 lb 3.2 oz (137.5 kg)   BP Readings from Last 3 Encounters:  11/07/20 130/87  10/23/20 138/88  09/25/20 (!) 156/81   Pulse Readings from Last 3 Encounters:  10/23/20 66  09/25/20 72  09/16/20 81    Renal function: CrCl cannot be calculated (Patient's most recent lab result is older than the maximum 21 days allowed.).  Past Medical History:  Diagnosis Date  . Diabetes mellitus   . GERD 05/05/2007  . Headache(784.0)   . Hyperlipidemia   . Hypertension   . 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 Obesity   . Preoperative evaluation of a medical condition to rule out surgical contraindications (TAR required) 10/23/2020  . 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  no inhaler  . Substance abuse ([place])    ETOH  . TOBACCO ABUSE 12/23/2008  . TRIGGER FINGER 05/05/2007  . Tuberculosis    pos PPD    Current Outpatient Medications on File Prior to Visit  Medication Sig Dispense Refill  . B-D 3CC LUER-LOK SYR 22GX1" 22G X 1" 3 ML MISC See admin instructions.    . carvedilol (COREG) 25 MG tablet Take 1 tablet (25 mg total) by mouth 2 (two) times daily. 180 tablet 3  . chlorthalidone (HYGROTON) 25 MG tablet Take 1 tablet (25 mg total) by mouth daily. 90 tablet 3  . cholecalciferol (VITAMIN D) 1000 UNITS tablet Take 1,000 Units by  mouth at bedtime.     . diphenhydrAMINE (SOMINEX) 25 MG tablet Take 25 mg by mouth daily as needed for allergies or sleep.    [name] gabapentin (NEURONTIN) 300 MG capsule Take 900 mg by mouth 3 (three) times daily.    [name] GRALISE 600 MG TABS Take 600-1,200 mg by mouth at bedtime.   4  . lisinopril (ZESTRIL) 40 MG tablet Take 40 mg by mouth daily.    . metFORMIN (GLUCOPHAGE) 1000 MG tablet Take 1,000 mg by mouth at bedtime.  1  . methocarbamol (ROBAXIN) 500 MG tablet Take 500 mg by mouth 4 (four) times daily.    . naproxen (NAPROSYN) 500 MG tablet Take 1 tablet (500 mg total) by mouth 2 (two) times daily as needed for mild pain, moderate pain or headache (TAKE WITH MEALS.). 20 tablet 0  . oxyCODONE-acetaminophen (PERCOCET) 10-325 MG tablet Take 1 tablet by mouth every 6 (six) hours as needed.    . pantoprazole (PROTONIX) 40 MG tablet Take 40 mg by mouth at bedtime.     . potassium chloride (K-DUR,KLOR-CON) 10 MEQ tablet Take 10 mEq  by mouth at bedtime.   0  . pravastatin (PRAVACHOL) 20 MG tablet Take 20 mg by mouth at bedtime.  1  . sildenafil (VIAGRA) 100 MG tablet Take 100 mg by mouth daily as needed for erectile dysfunction.    [name] spironolactone (ALDACTONE) 25 MG tablet Take 1 tablet (25 mg total) by mouth daily. 90 tablet 3  . testosterone cypionate (DEPOTESTOSTERONE CYPIONATE) 200 MG/ML injection Inject 1 mL into the muscle every 28 (twenty-eight) days.  0   No current facility-administered medications on file prior to visit.    Allergies  Allergen Reactions  . Shellfish Allergy Anaphylaxis  . Iodine Rash  . Penicillins Itching    Has patient had a PCN reaction causing immediate rash, facial/tongue/throat swelling, SOB or lightheadedness with hypotension: No Has patient had a PCN reaction causing severe rash involving mucus membranes or skin necrosis: No Has patient had a PCN reaction that required hospitalization No Has patient had a PCN reaction occurring within the last 10 years: No If all  of the above answers are "NO", then may proceed with Cephalosporin use.    There were no vitals taken for this visit.  No problem-specific Assessment & Plan notes found for this encounter.   Miguel Brady PharmD, BCPS, [place] [address] 12/21/2020 12:55 PM

## 2020-12-22 DIAGNOSIS — I10 Essential (primary) hypertension: Secondary | ICD-10-CM | POA: Diagnosis not present

## 2020-12-27 ENCOUNTER — Ambulatory Visit (INDEPENDENT_AMBULATORY_CARE_PROVIDER_SITE_OTHER): Payer: Medicaid Other | Admitting: Podiatry

## 2020-12-27 ENCOUNTER — Other Ambulatory Visit: Payer: Self-pay

## 2020-12-27 DIAGNOSIS — L98499 Non-pressure chronic ulcer of skin of other sites with unspecified severity: Secondary | ICD-10-CM

## 2020-12-27 DIAGNOSIS — E13622 Other specified diabetes mellitus with other skin ulcer: Secondary | ICD-10-CM

## 2020-12-27 NOTE — Progress Notes (Signed)
[facility] PRE[facility] Weekly Session   Patient Details  Name: Miguel Brady MRN: [id number] Date of Birth: 01-16-1961 Age: 60 y.o. PCP: Lujean Amel, MD  Vitals:   12/26/20 1830  Weight: (!) 303 lb (137.4 kg)     [facility] Weekly seesion - 12/27/20 1100      Weekly Session   Topic Discussed Other   Food Portions   Minutes exercised this week --   not documented   Classes attended to date 13            [place] 12/27/2020, 11:53 AM

## 2020-12-28 ENCOUNTER — Encounter: Payer: Self-pay | Admitting: Podiatry

## 2020-12-28 NOTE — Progress Notes (Signed)
Subjective:  Patient ID: Miguel Brady, male    DOB: November 07, 1960,  MRN: [id number]  Chief Complaint  Patient presents with  . Foot Ulcer    Left leg ulcer.     60 y.o. male presents for wound care.  Patient presents with follows up with left leg ulcer with fat layer exposed.  Patient is here for graft application on a weekly basis.   Review of Systems: Negative except as noted in the HPI. Denies N/V/F/Ch.  Past Medical History:  Diagnosis Date  . Diabetes mellitus   . GERD 05/05/2007  . Headache(784.0)   . Hyperlipidemia   . Hypertension   . 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 Obesity   . Preoperative evaluation of a medical condition to rule out surgical contraindications (TAR required) 10/23/2020  . 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  no inhaler  . Substance abuse ([place])    ETOH  . TOBACCO ABUSE 12/23/2008  . TRIGGER FINGER 05/05/2007  . Tuberculosis    pos PPD    Current Outpatient Medications:  .  B-D 3CC LUER-LOK SYR 22GX1" 22G X 1" 3 ML MISC, See admin instructions., Disp: , Rfl:  .  carvedilol (COREG) 25 MG tablet, Take 1 tablet (25 mg total) by mouth 2 (two) times daily., Disp: 180 tablet, Rfl: 3 .  chlorthalidone (HYGROTON) 25 MG tablet, Take 1 tablet (25 mg total) by mouth daily., Disp: 90 tablet, Rfl: 3 .  cholecalciferol (VITAMIN D) 1000 UNITS tablet, Take 1,000 Units by mouth at bedtime. , Disp: , Rfl:  .  diphenhydrAMINE (SOMINEX) 25 MG tablet, Take 25 mg by mouth daily as needed for allergies or sleep., Disp: , Rfl:  .  gabapentin (NEURONTIN) 300 MG capsule, Take 900 mg by mouth 3 (three) times daily., Disp: , Rfl:  .  GRALISE 600 MG TABS, Take 600-1,200 mg by mouth at bedtime. , Disp: , Rfl: 4 .  lisinopril (ZESTRIL) 40 MG tablet, Take 40 mg by mouth daily., Disp: , Rfl:  .  metFORMIN (GLUCOPHAGE) 1000 MG  tablet, Take 1,000 mg by mouth at bedtime., Disp: , Rfl: 1 .  methocarbamol (ROBAXIN) 500 MG tablet, Take 500 mg by mouth 4 (four) times daily., Disp: , Rfl:  .  naproxen (NAPROSYN) 500 MG tablet, Take 1 tablet (500 mg total) by mouth 2 (two) times daily as needed for mild pain, moderate pain or headache (TAKE WITH MEALS.)., Disp: 20 tablet, Rfl: 0 .  oxyCODONE-acetaminophen (PERCOCET) 10-325 MG tablet, Take 1 tablet by mouth every 6 (six) hours as needed., Disp: , Rfl:  .  pantoprazole (PROTONIX) 40 MG tablet, Take 40 mg by mouth at bedtime. , Disp: , Rfl:  .  potassium chloride (K-DUR,KLOR-CON) 10 MEQ tablet, Take 10 mEq by mouth at bedtime. , Disp: , Rfl: 0 .  pravastatin (PRAVACHOL) 20 MG tablet, Take 20 mg by mouth at bedtime., Disp: , Rfl: 1 .  sildenafil (VIAGRA) 100 MG tablet, Take 100 mg by mouth daily as needed for erectile dysfunction., Disp: , Rfl:  .  spironolactone (ALDACTONE) 25 MG tablet, Take 1 tablet (25 mg total) by mouth daily., Disp: 90 tablet, Rfl: 3 .  testosterone cypionate (DEPOTESTOSTERONE CYPIONATE) 200 MG/ML injection, Inject 1 mL into the muscle every 28 (twenty-eight) days., Disp: , Rfl: 0  Social History  Tobacco Use  Smoking Status Former Smoker  . Packs/day: 0.50  . Years: 30.00  . Pack years: 15.00  . Types: Cigarettes  . Start date: 06/25/2020  Smokeless Tobacco Never Used    Allergies  Allergen Reactions  . Shellfish Allergy Anaphylaxis  . Iodine Rash  . Penicillins Itching    Has patient had a PCN reaction causing immediate rash, facial/tongue/throat swelling, SOB or lightheadedness with hypotension: No Has patient had a PCN reaction causing severe rash involving mucus membranes or skin necrosis: No Has patient had a PCN reaction that required hospitalization No Has patient had a PCN reaction occurring within the last 10 years: No If all of the above answers are "NO", then may proceed with Cephalosporin use.   Objective:  There were no vitals  filed for this visit. There is no height or weight on file to calculate BMI. Constitutional Well developed. Well nourished.  Vascular Dorsalis pedis pulses palpable bilaterally. Posterior tibial pulses palpable bilaterally. Capillary refill normal to all digits.  No cyanosis or clubbing noted. Pedal hair growth normal.  Neurologic Normal speech. Oriented to person, place, and time. Protective sensation absent  Dermatologic Wound Location: Left leg ulcer with fat layer exposed.  Does not probe down to bone.  No purulent drainage expressed.  No malodor present. Wound Base: Mixed Granular/Fibrotic Peri-wound: Calloused Exudate: Scant/small amount Serosanguinous exudate Wound Measurements: -See below  Orthopedic: No pain to palpation either foot.   Radiographs: None Assessment:   1. Skin ulcer due to secondary diabetes mellitus ([place])    Plan:  Patient was evaluated and treated and all questions answered.  Ulcer left leg ulcer with fat layer exposed -Debridement as below. -Dressed with Betadine wet-to-dry, DSD. -Continue off-loading with surgical shoe. -We will continue to apply organogenesis graft application until resolve meant of the wound. -I applied donated graft was applied in standard technique..   Procedure: Excisional Debridement of Wound Tool: Sharp chisel blade/tissue nipper Rationale: Removal of non-viable soft tissue from the wound to promote healing.  Anesthesia: none Pre-Debridement Wound Measurements: 1.8 cm x 0.6 cm Post-Debridement Wound Measurements: 1.8 cm x 0.7 cm Type of Debridement: Sharp Excisional Tissue Removed: Non-viable soft tissue Blood loss: Minimal (<50cc) Depth of Debridement: subcutaneous tissue. Technique: Sharp excisional debridement to bleeding, viable wound base.  Wound Progress: The wound size is decreasing and appear to be more granulation tissue present after the application of synthetic graft Dressing: Dry, sterile, compression  dressing. Disposition: Patient tolerated procedure well. Patient to return in 1 week for follow-up.  No follow-ups on file.

## 2021-01-03 NOTE — Progress Notes (Signed)
[facility] PREP Weekly Session   Patient Details  Name: Miguel Brady MRN: [id number] Date of Birth: 1960-10-09 Age: 60 y.o. PCP: Lujean Amel, MD  Vitals:   01/02/21 1830  Weight: 298 lb 3.2 oz (135.3 kg)     [facility] Weekly seesion - 01/03/21 1300      Weekly Session   Topic Discussed Finding support    Minutes exercised this week 90 minutes    Classes attended to date 15            [place] 01/03/2021, 1:08 PM

## 2021-01-10 NOTE — Progress Notes (Signed)
[facility] PREP Weekly Session   Patient Details  Name: Miguel Brady MRN: [id number] Date of Birth: [birth year] Age: 60 y.o. PCP: Lujean Amel, MD  Vitals:   01/09/21 1815  Weight: 292 lb 3.2 oz (132.5 kg)     [facility] Weekly seesion - 01/10/21 1100      Weekly Session   Topic Discussed Calorie breakdown    Minutes exercised this week 125 minutes    Classes attended to date 66            [place] 01/10/2021, 11:40 AM

## 2021-01-12 ENCOUNTER — Other Ambulatory Visit: Payer: Self-pay

## 2021-01-12 ENCOUNTER — Ambulatory Visit: Payer: Medicaid Other | Admitting: Podiatry

## 2021-01-12 DIAGNOSIS — L98499 Non-pressure chronic ulcer of skin of other sites with unspecified severity: Secondary | ICD-10-CM

## 2021-01-12 DIAGNOSIS — E13622 Other specified diabetes mellitus with other skin ulcer: Secondary | ICD-10-CM

## 2021-01-16 ENCOUNTER — Encounter: Payer: Self-pay | Admitting: Podiatry

## 2021-01-16 NOTE — Progress Notes (Signed)
Subjective:  Patient ID: Miguel Brady, male    DOB: 1961/04/19,  MRN: [id number]  Chief Complaint  Patient presents with  . Diabetic Ulcer    Left leg skin ulcer    60 y.o. male presents for wound care.  Patient presents with follows up with left leg ulcer with fat layer exposed.  Patient is here for graft application on a weekly basis.   Review of Systems: Negative except as noted in the HPI. Denies N/V/F/Ch.  Past Medical History:  Diagnosis Date  . Diabetes mellitus   . GERD 05/05/2007  . Headache(784.0)   . Hyperlipidemia   . Hypertension   . 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 Obesity   . Preoperative evaluation of a medical condition to rule out surgical contraindications (TAR required) 10/23/2020  . 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  no inhaler  . Substance abuse ([place])    ETOH  . TOBACCO ABUSE 12/23/2008  . TRIGGER FINGER 05/05/2007  . Tuberculosis    pos PPD    Current Outpatient Medications:  .  B-D 3CC LUER-LOK SYR 22GX1" 22G X 1" 3 ML MISC, See admin instructions., Disp: , Rfl:  .  carvedilol (COREG) 25 MG tablet, Take 1 tablet (25 mg total) by mouth 2 (two) times daily., Disp: 180 tablet, Rfl: 3 .  chlorthalidone (HYGROTON) 25 MG tablet, Take 1 tablet (25 mg total) by mouth daily., Disp: 90 tablet, Rfl: 3 .  cholecalciferol (VITAMIN D) 1000 UNITS tablet, Take 1,000 Units by mouth at bedtime. , Disp: , Rfl:  .  diphenhydrAMINE (SOMINEX) 25 MG tablet, Take 25 mg by mouth daily as needed for allergies or sleep., Disp: , Rfl:  .  gabapentin (NEURONTIN) 300 MG capsule, Take 900 mg by mouth 3 (three) times daily., Disp: , Rfl:  .  GRALISE 600 MG TABS, Take 600-1,200 mg by mouth at bedtime. , Disp: , Rfl: 4 .  lisinopril (ZESTRIL) 40 MG tablet, Take 40 mg by mouth daily., Disp: , Rfl:  .  metFORMIN (GLUCOPHAGE) 1000  MG tablet, Take 1,000 mg by mouth at bedtime., Disp: , Rfl: 1 .  methocarbamol (ROBAXIN) 500 MG tablet, Take 500 mg by mouth 4 (four) times daily., Disp: , Rfl:  .  naproxen (NAPROSYN) 500 MG tablet, Take 1 tablet (500 mg total) by mouth 2 (two) times daily as needed for mild pain, moderate pain or headache (TAKE WITH MEALS.)., Disp: 20 tablet, Rfl: 0 .  oxyCODONE-acetaminophen (PERCOCET) 10-325 MG tablet, Take 1 tablet by mouth every 6 (six) hours as needed., Disp: , Rfl:  .  pantoprazole (PROTONIX) 40 MG tablet, Take 40 mg by mouth at bedtime. , Disp: , Rfl:  .  potassium chloride (K-DUR,KLOR-CON) 10 MEQ tablet, Take 10 mEq by mouth at bedtime. , Disp: , Rfl: 0 .  pravastatin (PRAVACHOL) 20 MG tablet, Take 20 mg by mouth at bedtime., Disp: , Rfl: 1 .  sildenafil (VIAGRA) 100 MG tablet, Take 100 mg by mouth daily as needed for erectile dysfunction., Disp: , Rfl:  .  spironolactone (ALDACTONE) 25 MG tablet, Take 1 tablet (25 mg total) by mouth daily., Disp: 90 tablet, Rfl: 3 .  testosterone cypionate (DEPOTESTOSTERONE CYPIONATE) 200 MG/ML injection, Inject 1 mL into the muscle every 28 (twenty-eight) days., Disp: , Rfl: 0  Social History  Tobacco Use  Smoking Status Former Smoker  . Packs/day: 0.50  . Years: 30.00  . Pack years: 15.00  . Types: Cigarettes  . Start date: 06/25/2020  Smokeless Tobacco Never Used    Allergies  Allergen Reactions  . Shellfish Allergy Anaphylaxis  . Iodine Rash  . Penicillins Itching    Has patient had a PCN reaction causing immediate rash, facial/tongue/throat swelling, SOB or lightheadedness with hypotension: No Has patient had a PCN reaction causing severe rash involving mucus membranes or skin necrosis: No Has patient had a PCN reaction that required hospitalization No Has patient had a PCN reaction occurring within the last 10 years: No If all of the above answers are "NO", then may proceed with Cephalosporin use.   Objective:  There were no  vitals filed for this visit. There is no height or weight on file to calculate BMI. Constitutional Well developed. Well nourished.  Vascular Dorsalis pedis pulses palpable bilaterally. Posterior tibial pulses palpable bilaterally. Capillary refill normal to all digits.  No cyanosis or clubbing noted. Pedal hair growth normal.  Neurologic Normal speech. Oriented to person, place, and time. Protective sensation absent  Dermatologic Wound Location: Left leg ulcer with fat layer exposed.  Does not probe down to bone.  No purulent drainage expressed.  No malodor present. Wound Base: Mixed Granular/Fibrotic Peri-wound: Calloused Exudate: Scant/small amount Serosanguinous exudate Wound Measurements: -See below  Orthopedic: No pain to palpation either foot.   Radiographs: None Assessment:   1. Skin ulcer due to secondary diabetes mellitus ([place])    Plan:  Patient was evaluated and treated and all questions answered.  Ulcer left leg ulcer with fat layer exposed -Debridement as below. -Dressed with Betadine wet-to-dry, DSD. -Continue off-loading with surgical shoe. -Prisma was applied.  Educated the patient on application of Prisma.  I have asked him to apply it every other day.   Procedure: Excisional Debridement of Wound Tool: Sharp chisel blade/tissue nipper Rationale: Removal of non-viable soft tissue from the wound to promote healing.  Anesthesia: none Pre-Debridement Wound Measurements: 1.5 cm x 0.6 cm Post-Debridement Wound Measurements: 1.5 cm x 0.7 cm Type of Debridement: Sharp Excisional Tissue Removed: Non-viable soft tissue Blood loss: Minimal (<50cc) Depth of Debridement: subcutaneous tissue. Technique: Sharp excisional debridement to bleeding, viable wound base.  Wound Progress: The wound size is decreasing and appear to be more granulation tissue. Dressing: Dry, sterile, compression dressing. Disposition: Patient tolerated procedure well. Patient to return in 1  week for follow-up.  No follow-ups on file.

## 2021-01-17 NOTE — Progress Notes (Signed)
[facility] PREP Weekly Session   Patient Details  Name: Miguel Brady MRN: [id number] Date of Birth: 1961/03/07 Age: 61 y.o. PCP: Lujean Amel, MD  Vitals:   01/16/21 1800  Weight: 292 lb (132.5 kg)     [facility] Weekly seesion - 01/17/21 0900      Weekly Session   Topic Discussed Hitting roadblocks    Minutes exercised this week 640 minutes   40 lbs/ strength   Classes attended to date [place] 01/17/2021, 9:41 AM

## 2021-01-21 DIAGNOSIS — I10 Essential (primary) hypertension: Secondary | ICD-10-CM | POA: Diagnosis not present

## 2021-01-23 ENCOUNTER — Ambulatory Visit: Payer: Medicaid Other | Admitting: Pharmacist

## 2021-01-23 ENCOUNTER — Other Ambulatory Visit: Payer: Self-pay

## 2021-01-23 VITALS — BP 102/50 | HR 75 | Resp 16 | Ht 72.0 in | Wt 296.8 lb

## 2021-01-23 DIAGNOSIS — I499 Cardiac arrhythmia, unspecified: Secondary | ICD-10-CM | POA: Insufficient documentation

## 2021-01-23 DIAGNOSIS — N481 Balanitis: Secondary | ICD-10-CM | POA: Insufficient documentation

## 2021-01-23 DIAGNOSIS — M1612 Unilateral primary osteoarthritis, left hip: Secondary | ICD-10-CM | POA: Insufficient documentation

## 2021-01-23 DIAGNOSIS — G894 Chronic pain syndrome: Secondary | ICD-10-CM | POA: Insufficient documentation

## 2021-01-23 DIAGNOSIS — E291 Testicular hypofunction: Secondary | ICD-10-CM | POA: Insufficient documentation

## 2021-01-23 DIAGNOSIS — I10 Essential (primary) hypertension: Secondary | ICD-10-CM

## 2021-01-23 DIAGNOSIS — E7800 Pure hypercholesterolemia, unspecified: Secondary | ICD-10-CM | POA: Insufficient documentation

## 2021-01-23 DIAGNOSIS — E1140 Type 2 diabetes mellitus with diabetic neuropathy, unspecified: Secondary | ICD-10-CM | POA: Insufficient documentation

## 2021-01-23 DIAGNOSIS — R413 Other amnesia: Secondary | ICD-10-CM | POA: Insufficient documentation

## 2021-01-23 DIAGNOSIS — J309 Allergic rhinitis, unspecified: Secondary | ICD-10-CM | POA: Insufficient documentation

## 2021-01-23 HISTORY — DX: Cardiac arrhythmia, unspecified: I49.9

## 2021-01-23 MED ORDER — CHLORTHALIDONE 25 MG PO TABS: 25.0000 mg | ORAL_TABLET | Freq: Every day | ORAL | 3 refills | Status: DC

## 2021-01-23 MED ORDER — SPIRONOLACTONE 25 MG PO TABS: 25.0000 mg | ORAL_TABLET | Freq: Every day | ORAL | 3 refills | Status: DC

## 2021-01-23 NOTE — Patient Instructions (Addendum)
It was nice meeting you today  We would like to keep your blood pressure less than 130/80  Continue your carvedilol 12.5mg  twice a day  Pick up your lisinopril 10 mg from the pharmacy and take that once a day.  This will help your blood pressure and your kidneys  Continue your spironolactone 25 mg once daily Continue your chlorthalidone 25 mg once daily  Stop your furosemide 40 mg and your Potassium tablet  Your psychiatrist should be in contact with you   Keep taking your blood pressure every morning  Karren Cobble, PharmD, BCACP, [place], [place] [address] [place], [place] [address] Phone: [phone]; Fax: [phone] 01/23/2021 2:13 PM
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
## 2021-01-24 NOTE — Progress Notes (Signed)
[facility] PREP Weekly Session   Patient Details  Name: Miguel Brady MRN: [id number] Date of Birth: 1961-02-20 Age: 60 y.o. PCP: Lujean Amel, MD  There were no vitals filed for this visit.   [facility] Weekly seesion - 01/24/21 0900      Weekly Session   Topic Discussed --   Final Fit Testing for PREP   Minutes exercised this week --   6 hr of cardio charted, 2 hrs of flexibility per pt   Classes attended to date 77         PREP class 01/23/21 Able to do sit to stand at end of program (not able to do at beginning) Stood to do cardio march test Able to balance with aid of crutches safely   Barnett Hatter 01/24/2021, 9:46 AM
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
## 2021-02-26 ENCOUNTER — Ambulatory Visit: Payer: Medicaid Other | Admitting: Cardiovascular Disease

## 2021-02-26 ENCOUNTER — Other Ambulatory Visit: Payer: Self-pay

## 2021-02-26 ENCOUNTER — Encounter: Payer: Self-pay | Admitting: Cardiovascular Disease

## 2021-02-26 DIAGNOSIS — I10 Essential (primary) hypertension: Secondary | ICD-10-CM | POA: Diagnosis not present

## 2021-02-26 DIAGNOSIS — G4733 Obstructive sleep apnea (adult) (pediatric): Secondary | ICD-10-CM

## 2021-02-26 DIAGNOSIS — E7800 Pure hypercholesterolemia, unspecified: Secondary | ICD-10-CM | POA: Diagnosis not present

## 2021-02-26 HISTORY — DX: Obstructive sleep apnea (adult) (pediatric): G47.33

## 2021-02-26 NOTE — Assessment & Plan Note (Signed)
Lipids well-controlled on pravastatin.

## 2021-02-26 NOTE — Assessment & Plan Note (Signed)
Continue OSA.

## 2021-02-26 NOTE — Assessment & Plan Note (Signed)
He is going to keep working on diet and exercise.  We will check with our social work team to see if there are any other options for weight loss programs.  He was unable to afford healthy weight and wellness and at this time he cannot repeat the PREP program through the [facility].

## 2021-02-26 NOTE — Patient Instructions (Signed)
Medication Instructions:  Your physician recommends that you continue on your current medications as directed. Please refer to the Current Medication list given to you today.   Labwork: NONE   Testing/Procedures: NONE   Follow-Up: Your physician wants you to follow-up in: 6 MONTHS  You will receive a reminder letter in the mail two months in advance. If you don't receive a letter, please call our office to schedule the follow-up appointment.  

## 2021-02-26 NOTE — Assessment & Plan Note (Signed)
Blood pressure is much better controlled.  He is stable on carvedilol, chlorthalidone, lisinopril, and spironolactone.  He was encouraged to continue checking his blood pressure at least weekly, but we will unroll him from the [employer] remote patient monitoring program.  Please go to keep working on weight loss that he can get surgery on his back and hopefully can be more mobile.  Continue limiting sodium intake.

## 2021-02-26 NOTE — Assessment & Plan Note (Signed)
Lipids are well controlled on pravastatin. 

## 2021-02-27 ENCOUNTER — Telehealth: Payer: Self-pay | Admitting: Licensed Clinical Social Worker

## 2021-02-27 ENCOUNTER — Telehealth: Payer: Self-pay

## 2021-02-27 DIAGNOSIS — Z0000 Encounter for general adult medical examination without abnormal findings: Secondary | ICD-10-CM

## 2021-02-27 NOTE — Telephone Encounter (Signed)
Called patient to discuss [employer] assistance program and to offer health coaching to aid in increasing physical activity. Was unable to leave a message because line was busy. Attempted call three times. Will try to call patient again on 02/28/21.

## 2021-02-27 NOTE — Telephone Encounter (Signed)
Pt referral received from Dr. Oval Linsey and Rip Harbour, LPN, regarding assistance programs for local fitness programs. Pt may qualify for reduced fee at [facility] depending on income. I have sent referral to PREP program leads Zacarias Pontes and Pam since pt has completed that program and Health Coach/Care Guide Amy Truman Hayward to engage pt further.   Please re-consult if any additional needs arise.  Westley Hummer, MSW, [place]  [phone]

## 2021-03-05 NOTE — Progress Notes (Signed)
Reached to pt via text this am at the request of Social Work and [employer] in Dr [employer] office.  Inquired about status of application for scholarship for [employer] Pt has misplaced copy of application Sts has been released from Cardiologist since BP is perfect.  Asked pt to let me know if he would like a copy mailed  or emailed to him to complete. Will send to pt once he responds.

## 2021-05-01 ENCOUNTER — Ambulatory Visit: Payer: Medicaid Other | Attending: Family Medicine | Admitting: Physical Therapy

## 2021-05-01 ENCOUNTER — Encounter: Payer: Self-pay | Admitting: Physical Therapy

## 2021-05-01 ENCOUNTER — Other Ambulatory Visit: Payer: Self-pay

## 2021-05-01 DIAGNOSIS — M6281 Muscle weakness (generalized): Secondary | ICD-10-CM

## 2021-05-01 DIAGNOSIS — R262 Difficulty in walking, not elsewhere classified: Secondary | ICD-10-CM | POA: Diagnosis present

## 2021-05-01 DIAGNOSIS — M25552 Pain in left hip: Secondary | ICD-10-CM
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
## 2021-05-10 ENCOUNTER — Other Ambulatory Visit: Payer: Self-pay

## 2021-05-10 ENCOUNTER — Ambulatory Visit: Payer: Medicaid Other | Admitting: Physical Therapy

## 2021-05-10 DIAGNOSIS — M25552 Pain in left hip: Secondary | ICD-10-CM

## 2021-05-10 DIAGNOSIS — M6281 Muscle weakness (generalized): Secondary | ICD-10-CM

## 2021-05-10 DIAGNOSIS — R262 Difficulty in walking, not elsewhere classified: Secondary | ICD-10-CM
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
## 2021-05-14 ENCOUNTER — Ambulatory Visit: Payer: Medicaid Other | Admitting: Physical Therapy

## 2021-05-14 ENCOUNTER — Other Ambulatory Visit: Payer: Self-pay

## 2021-05-14 ENCOUNTER — Encounter: Payer: Self-pay | Admitting: Physical Therapy

## 2021-05-14 DIAGNOSIS — R262 Difficulty in walking, not elsewhere classified: Secondary | ICD-10-CM

## 2021-05-14 DIAGNOSIS — M6281 Muscle weakness (generalized): Secondary | ICD-10-CM

## 2021-05-14 DIAGNOSIS — M25552 Pain in left hip: Secondary | ICD-10-CM
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
## 2021-05-17 ENCOUNTER — Other Ambulatory Visit: Payer: Self-pay

## 2021-05-17 ENCOUNTER — Encounter: Payer: Self-pay | Admitting: Physical Therapy

## 2021-05-17 ENCOUNTER — Ambulatory Visit: Payer: Medicaid Other | Attending: Family Medicine | Admitting: Physical Therapy

## 2021-05-17 DIAGNOSIS — M25552 Pain in left hip: Secondary | ICD-10-CM | POA: Insufficient documentation

## 2021-05-17 DIAGNOSIS — R262 Difficulty in walking, not elsewhere classified: Secondary | ICD-10-CM | POA: Diagnosis present

## 2021-05-17 DIAGNOSIS — M6281 Muscle weakness (generalized): Secondary | ICD-10-CM | POA: Insufficient documentation
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
## 2021-05-22 ENCOUNTER — Encounter: Payer: Self-pay | Admitting: Physical Therapy

## 2021-05-22 ENCOUNTER — Other Ambulatory Visit: Payer: Self-pay

## 2021-05-22 ENCOUNTER — Ambulatory Visit: Payer: Medicaid Other | Admitting: Physical Therapy

## 2021-05-22 DIAGNOSIS — M6281 Muscle weakness (generalized): Secondary | ICD-10-CM

## 2021-05-22 DIAGNOSIS — R262 Difficulty in walking, not elsewhere classified: Secondary | ICD-10-CM

## 2021-05-22 DIAGNOSIS — M25552 Pain in left hip: Secondary | ICD-10-CM
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
## 2021-05-24 ENCOUNTER — Encounter: Payer: Medicaid Other | Admitting: Physical Therapy

## 2021-05-28 ENCOUNTER — Ambulatory Visit: Payer: Medicaid Other | Admitting: Physical Therapy

## 2021-05-28 ENCOUNTER — Other Ambulatory Visit: Payer: Self-pay

## 2021-05-28 ENCOUNTER — Encounter: Payer: Self-pay | Admitting: Physical Therapy

## 2021-05-28 DIAGNOSIS — M6281 Muscle weakness (generalized): Secondary | ICD-10-CM

## 2021-05-28 DIAGNOSIS — R262 Difficulty in walking, not elsewhere classified: Secondary | ICD-10-CM

## 2021-05-28 DIAGNOSIS — M25552 Pain in left hip: Secondary | ICD-10-CM
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
## 2021-05-30 ENCOUNTER — Other Ambulatory Visit: Payer: Self-pay

## 2021-05-30 ENCOUNTER — Ambulatory Visit: Payer: Medicaid Other | Admitting: Physical Therapy

## 2021-05-30 ENCOUNTER — Encounter: Payer: Self-pay | Admitting: Physical Therapy

## 2021-05-30 DIAGNOSIS — R262 Difficulty in walking, not elsewhere classified: Secondary | ICD-10-CM

## 2021-05-30 DIAGNOSIS — M6281 Muscle weakness (generalized): Secondary | ICD-10-CM | POA: Diagnosis not present

## 2021-05-30 DIAGNOSIS — M25552 Pain in left hip: Secondary | ICD-10-CM
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
## 2021-06-04 ENCOUNTER — Other Ambulatory Visit: Payer: Self-pay

## 2021-06-04 ENCOUNTER — Ambulatory Visit: Payer: Medicaid Other | Admitting: Physical Therapy

## 2021-06-04 ENCOUNTER — Encounter: Payer: Self-pay | Admitting: Physical Therapy

## 2021-06-04 DIAGNOSIS — M6281 Muscle weakness (generalized): Secondary | ICD-10-CM | POA: Diagnosis not present

## 2021-06-04 DIAGNOSIS — M25552 Pain in left hip: Secondary | ICD-10-CM

## 2021-06-04 DIAGNOSIS — R262 Difficulty in walking, not elsewhere classified: Secondary | ICD-10-CM
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
## 2021-06-06 ENCOUNTER — Ambulatory Visit: Payer: Medicaid Other | Admitting: Physical Therapy

## 2021-06-06 ENCOUNTER — Other Ambulatory Visit: Payer: Self-pay

## 2021-06-06 ENCOUNTER — Encounter: Payer: Self-pay | Admitting: Physical Therapy

## 2021-06-06 DIAGNOSIS — R262 Difficulty in walking, not elsewhere classified: Secondary | ICD-10-CM

## 2021-06-06 DIAGNOSIS — M6281 Muscle weakness (generalized): Secondary | ICD-10-CM

## 2021-06-06 DIAGNOSIS — M25552 Pain in left hip: Secondary | ICD-10-CM
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
## 2021-06-11 ENCOUNTER — Other Ambulatory Visit: Payer: Self-pay

## 2021-06-11 ENCOUNTER — Ambulatory Visit: Payer: Medicaid Other | Admitting: Physical Therapy

## 2021-06-11 DIAGNOSIS — M6281 Muscle weakness (generalized): Secondary | ICD-10-CM | POA: Diagnosis not present

## 2021-06-11 DIAGNOSIS — R262 Difficulty in walking, not elsewhere classified: Secondary | ICD-10-CM

## 2021-06-11 DIAGNOSIS — M25552 Pain in left hip: Secondary | ICD-10-CM
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
## 2021-06-13 ENCOUNTER — Ambulatory Visit: Payer: Medicaid Other | Admitting: Physical Therapy

## 2021-06-13 ENCOUNTER — Encounter: Payer: Self-pay | Admitting: Physical Therapy

## 2021-06-13 ENCOUNTER — Other Ambulatory Visit: Payer: Self-pay

## 2021-06-13 DIAGNOSIS — M6281 Muscle weakness (generalized): Secondary | ICD-10-CM

## 2021-06-13 DIAGNOSIS — M25552 Pain in left hip: Secondary | ICD-10-CM

## 2021-06-13 DIAGNOSIS — R262 Difficulty in walking, not elsewhere classified: Secondary | ICD-10-CM
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
## 2021-06-19 ENCOUNTER — Ambulatory Visit: Payer: Medicaid Other | Attending: Family Medicine | Admitting: Physical Therapy

## 2021-06-19 ENCOUNTER — Other Ambulatory Visit: Payer: Self-pay

## 2021-06-19 DIAGNOSIS — M6281 Muscle weakness (generalized): Secondary | ICD-10-CM | POA: Insufficient documentation

## 2021-06-19 DIAGNOSIS — M25552 Pain in left hip: Secondary | ICD-10-CM | POA: Insufficient documentation

## 2021-06-19 DIAGNOSIS — R262 Difficulty in walking, not elsewhere classified: Secondary | ICD-10-CM | POA: Diagnosis present
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
## 2021-06-21 ENCOUNTER — Encounter: Payer: Medicaid Other | Admitting: Physical Therapy

## 2021-10-16 ENCOUNTER — Other Ambulatory Visit: Payer: Self-pay

## 2021-10-16 ENCOUNTER — Encounter (HOSPITAL_BASED_OUTPATIENT_CLINIC_OR_DEPARTMENT_OTHER): Payer: Self-pay | Admitting: Cardiovascular Disease

## 2021-10-16 ENCOUNTER — Ambulatory Visit (HOSPITAL_BASED_OUTPATIENT_CLINIC_OR_DEPARTMENT_OTHER): Payer: Medicaid Other | Admitting: Cardiovascular Disease

## 2021-10-16 DIAGNOSIS — E7800 Pure hypercholesterolemia, unspecified: Secondary | ICD-10-CM | POA: Diagnosis not present

## 2021-10-16 DIAGNOSIS — G4733 Obstructive sleep apnea (adult) (pediatric): Secondary | ICD-10-CM | POA: Diagnosis not present

## 2021-10-16 DIAGNOSIS — E119 Type 2 diabetes mellitus without complications: Secondary | ICD-10-CM | POA: Diagnosis not present

## 2021-10-16 MED ORDER — OZEMPIC (0.25 OR 0.5 MG/DOSE) 2 MG/1.5ML ~~LOC~~ SOPN
PEN_INJECTOR | SUBCUTANEOUS | 0 refills | Status: DC
Start: 2021-10-16 — End: 2021-11-21
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
## 2021-10-16 NOTE — Patient Instructions (Addendum)
Medication Instructions:  START OZEMPIC  INJECT 0.25 MG WEEKLY FOR 4 WEEKS  INCREASE TO 0.5 MG WEEKLY FOR 4 WEEKS FOLLOW UP WITH YOUR PRIMARY DISCUSS AND TO CONTINUE    Labwork: NONE  Testing/Procedures: NONE  Follow-Up: Your physician recommends that you schedule a follow-up appointment in 6 MONTHS

## 2021-10-16 NOTE — Assessment & Plan Note (Signed)
BP has been stable.  Recommend that he continue his regimen.  We also discussed the importance of increasing his exercise to at least 150 minutes weekly.  He is going to think about it.  Continue to limit sodium in his diet.  Continue current regimen of carvedilol, chlorthalidone, lisinopril, and spironolactone.

## 2021-10-16 NOTE — Assessment & Plan Note (Signed)
Continue CPAP.  

## 2021-10-16 NOTE — Assessment & Plan Note (Signed)
Hemoglobin A1c was 6.6%.  He is also struggling with obesity.  He notes that his PCP previously recommended Ozempic and I think this would be a good idea.  We will start with 0.25 mg once a week for 4 weeks then 0.5 mg once a week for 4 weeks.

## 2021-10-16 NOTE — Assessment & Plan Note (Signed)
Lipids are very well controlled.  Continue current statin.

## 2021-10-16 NOTE — Assessment & Plan Note (Signed)
Miguel Brady is morbidly obese.  He also has several comorbidities.  We are going to start Ozempic as above.  Recommend increasing his exercise to least 150 minutes weekly.

## 2021-11-15 ENCOUNTER — Other Ambulatory Visit (HOSPITAL_BASED_OUTPATIENT_CLINIC_OR_DEPARTMENT_OTHER): Payer: Self-pay | Admitting: Cardiovascular Disease

## 2021-11-21 ENCOUNTER — Telehealth (HOSPITAL_BASED_OUTPATIENT_CLINIC_OR_DEPARTMENT_OTHER): Payer: Self-pay

## 2021-11-21 ENCOUNTER — Telehealth: Payer: Self-pay | Admitting: Cardiovascular Disease

## 2021-11-21 MED ORDER — OZEMPIC (0.25 OR 0.5 MG/DOSE) 2 MG/1.5ML ~~LOC~~ SOPN
PEN_INJECTOR | SUBCUTANEOUS | 0 refills | Status: DC
Start: 2021-11-21 — End: 2021-12-17

## 2021-11-21 NOTE — Telephone Encounter (Signed)
RN was messaged for a stat call for the following message  ? ? ?"good morning! I have the patient on the line who has mistaken taking his medication Semaglutide,0.25 or 0.'5MG'$ /DOS, (OZEMPIC, 0.25 OR 0.5 MG/DOSE,) 2 MG/1.5ML SOPN. he said that he stuck himself 3 times and may have one needle stuck in his arm"  ? ? ?Upon clarification the patient attempted the first two administrations with the cap on, after the first two he remembered to pull the cap off and received the third dose. He denies anything being still stuck inside him.  ? ?Patient needed a refill now and one was sent to his pharmacy with note to the pharmacist explaining situation  ? ? ? ?

## 2021-11-21 NOTE — Telephone Encounter (Signed)
The patient called and has mistaken taking his medication Semaglutide,0.25 or 0.'5MG'$ /DOS, (OZEMPIC, 0.25 OR 0.5 MG/DOSE,) 2 MG/1.5ML SOPN. he said that he stuck himself 3 times and may have one needle stuck in his arm ?

## 2021-11-22 ENCOUNTER — Other Ambulatory Visit: Payer: Self-pay | Admitting: *Deleted

## 2021-11-22 DIAGNOSIS — F17210 Nicotine dependence, cigarettes, uncomplicated: Secondary | ICD-10-CM

## 2021-11-22 DIAGNOSIS — Z87891 Personal history of nicotine dependence: Secondary | ICD-10-CM

## 2021-11-23 NOTE — Telephone Encounter (Signed)
See other phone note 3/8 ?

## 2021-12-06 NOTE — Telephone Encounter (Signed)
Spoke with patient and he had messed up couple of the injections but did get another refill.  ?Advised to follow up with PCP regarding increasing dose, verbalized understanding  ?

## 2021-12-17 ENCOUNTER — Other Ambulatory Visit (HOSPITAL_BASED_OUTPATIENT_CLINIC_OR_DEPARTMENT_OTHER): Payer: Self-pay | Admitting: Cardiovascular Disease

## 2021-12-17 NOTE — Telephone Encounter (Signed)
Rx(s) sent to pharmacy electronically.  

## 2021-12-18 ENCOUNTER — Telehealth (HOSPITAL_BASED_OUTPATIENT_CLINIC_OR_DEPARTMENT_OTHER): Payer: Self-pay | Admitting: Cardiovascular Disease

## 2021-12-18 DIAGNOSIS — E1165 Type 2 diabetes mellitus with hyperglycemia: Secondary | ICD-10-CM

## 2021-12-18 DIAGNOSIS — E1140 Type 2 diabetes mellitus with diabetic neuropathy, unspecified: Secondary | ICD-10-CM

## 2021-12-18 MED ORDER — OZEMPIC (0.25 OR 0.5 MG/DOSE) 2 MG/3ML ~~LOC~~ SOPN: 0.5000 mg | PEN_INJECTOR | SUBCUTANEOUS | 0 refills | Status: DC

## 2021-12-18 MED ORDER — OZEMPIC (1 MG/DOSE) 4 MG/3ML ~~LOC~~ SOPN: 1.0000 mg | PEN_INJECTOR | SUBCUTANEOUS | 0 refills | Status: DC

## 2021-12-18 NOTE — Telephone Encounter (Signed)
Patient returning call. He needs to verify what dose he should be taking and know if it is already at the drugstore. He states he is frustrated and cannot keep making trips to the drugstore.  ?

## 2021-12-18 NOTE — Telephone Encounter (Signed)
Oh I misunderstood.  Increased to 1.'0mg'$  and sent to pharmacy ?

## 2021-12-18 NOTE — Telephone Encounter (Signed)
Called patient left message on personal voice mail to call back. 

## 2021-12-18 NOTE — Telephone Encounter (Signed)
-  Pt state he has already completed 4 weeks of 0.5 mg and thought he was supposed to increase to 1 mg.  ?-Will forward to Pharm D ?

## 2021-12-18 NOTE — Telephone Encounter (Signed)
Left message to call back  

## 2021-12-18 NOTE — Telephone Encounter (Signed)
Yes, patient should now be on 0.'5mg'$  once weekly now. Updated Rx sent to pharmacy ? ?

## 2021-12-18 NOTE — Telephone Encounter (Addendum)
Spoke to patient advised our pharmacist sent in Ozempic 1.0 mg prescription to your pharmacy. ?

## 2021-12-18 NOTE — Telephone Encounter (Signed)
Pt c/o medication issue: ? ?1. Name of Medication: OZEMPIC, 0.25 OR 0.5 MG/DOSE, 2 MG/3ML SOPN ? ?2. How are you currently taking this medication (dosage and times per day)? INJECT 0.'5MG'$  UNDER THE SKIN ONCE A WEEK AS DIRECTED ? ?3. Are you having a reaction (difficulty breathing--STAT)? no ? ?4. What is your medication issue? Patient thought this medication is supposed to increase. If so then a new prescription needs to be sent because patient is out of medication. ? ? ?

## 2021-12-25 ENCOUNTER — Encounter: Payer: Self-pay | Admitting: Acute Care

## 2021-12-25 ENCOUNTER — Ambulatory Visit (INDEPENDENT_AMBULATORY_CARE_PROVIDER_SITE_OTHER): Payer: Medicaid Other | Admitting: Acute Care

## 2021-12-25 DIAGNOSIS — F17210 Nicotine dependence, cigarettes, uncomplicated: Secondary | ICD-10-CM | POA: Diagnosis not present

## 2021-12-25 NOTE — Progress Notes (Signed)
?Virtual Visit via Telephone Note ? ?I connected with Miguel Brady on 12/25/21 at  2:30 PM EDT by telephone and verified that I am speaking with the correct person using two identifiers. ? ?Location: ?Patient:  At home ?Provider: [place], [place], [place], [address]  ?  ?I discussed the limitations, risks, security and privacy concerns of performing an evaluation and management service by telephone and the availability of in person appointments. I also discussed with the patient that there may be a patient responsible charge related to this service. The patient expressed understanding and agreed to proceed. ? ? ? ?Shared Decision Making Visit Lung Cancer Screening Program ?[phone]) ? ? ?Eligibility: ?Age 61 y.o. ?Pack Years Smoking History Calculation 20 pack year smoking history ?(# packs/per year x # years smoked) ?Recent History of coughing up blood  no ?Unexplained weight loss? no ?( >Than 15 pounds within the last 6 months ) ?Prior History Lung / other cancer no ?(Diagnosis within the last 5 years already requiring surveillance chest CT Scans). ?Smoking Status Current Smoker ?Former Smokers: Years since quit:  NA ? Quit Date:  NA ? ?Visit Components: ?Discussion included one or more decision making aids. yes ?Discussion included risk/benefits of screening. yes ?Discussion included potential follow up diagnostic testing for abnormal scans. yes ?Discussion included meaning and risk of over diagnosis. yes ?Discussion included meaning and risk of False Positives. yes ?Discussion included meaning of total radiation exposure. yes ? ?Counseling Included: ?Importance of adherence to annual lung cancer LDCT screening. yes ?Impact of comorbidities on ability to participate in the program. yes ?Ability and willingness to under diagnostic treatment. yes ? ?Smoking Cessation Counseling: ?Current Smokers:  ?Discussed importance of smoking cessation. yes ?Information about tobacco cessation classes and  interventions provided to patient. yes ?Patient provided with "ticket" for LDCT Scan. yes ?Symptomatic Patient. no ? Counseling NA ?Diagnosis Code: Tobacco Use Z72.0 ?Asymptomatic Patient yes ? Counseling (Intermediate counseling: > three minutes counseling) [id number] ?Former Smokers:  ?Discussed the importance of maintaining cigarette abstinence. yes ?Diagnosis Code: Personal History of Nicotine Dependence. B34.193 ?Information about tobacco cessation classes and interventions provided to patient. Yes ?Patient provided with "ticket" for LDCT Scan. yes ?Written Order for Lung Cancer Screening with LDCT placed in Epic. Yes ?(CT Chest Lung Cancer Screening Low Dose W/O CM) [id number] ?Z12.2-Screening of respiratory organs ?Z87.891-Personal history of nicotine dependence ? ?I have spent 25 minutes of face to face/ virtual visit   time with  Miguel Brady discussing the risks and benefits of lung cancer screening. We viewed / discussed a power point together that explained in detail the above noted topics. We paused at intervals to allow for questions to be asked and answered to ensure understanding.We discussed that the single most powerful action that he can take to decrease his risk of developing lung cancer is to quit smoking. We discussed whether or not he is ready to commit to setting a quit date. We discussed options for tools to aid in quitting smoking including nicotine replacement therapy, non-nicotine medications, support groups, Quit Smart classes, and behavior modification. We discussed that often times setting smaller, more achievable goals, such as eliminating 1 cigarette a day for a week and then 2 cigarettes a day for a week can be helpful in slowly decreasing the number of cigarettes smoked. This allows for a sense of accomplishment as well as providing a clinical benefit. I provided  him  with smoking cessation  information  with contact information for community resources,  classes, free nicotine replacement  therapy, and access to mobile apps, text messaging, and on-line smoking cessation help. I have also provided  him  the office contact information in the event he needs to contact me, or the screening staff. We discussed the time and location of the scan, and that either Doroteo Glassman RN, Joella Prince, RN  or I will call / send a letter with the results within 24-72 hours of receiving them. The patient verbalized understanding of all of  the above and had no further questions upon leaving the office. They have my contact information in the event they have any further questions. ? ?I spent 3 minutes counseling on smoking cessation and the health risks of continued tobacco abuse. ? ?I explained to the patient that there has been a high incidence of coronary artery disease noted on these exams. I explained that this is a non-gated exam therefore degree or severity cannot be determined. This patient is not on statin therapy. I have asked the patient to follow-up with their PCP regarding any incidental finding of coronary artery disease and management with diet or medication as their PCP  feels is clinically indicated. The patient verbalized understanding of the above and had no further questions upon completion of the visit. ? ?  ? ? ?Magdalen Spatz, NP ?12/25/2021 ? ? ? ? ? ? ?

## 2021-12-25 NOTE — Patient Instructions (Signed)

## 2021-12-26 ENCOUNTER — Other Ambulatory Visit: Payer: Medicaid Other

## 2022-01-09 ENCOUNTER — Ambulatory Visit
Admission: RE | Admit: 2022-01-09 | Discharge: 2022-01-09 | Disposition: A | Discharge status: Home / Self Care | Payer: Medicaid Other | Source: Ambulatory Visit | Attending: Acute Care | Admitting: Acute Care

## 2022-01-09 DIAGNOSIS — Z87891 Personal history of nicotine dependence: Secondary | ICD-10-CM

## 2022-01-09 DIAGNOSIS — F17210 Nicotine dependence, cigarettes, uncomplicated: Secondary | ICD-10-CM

## 2022-01-10 ENCOUNTER — Other Ambulatory Visit: Payer: Self-pay | Admitting: Acute Care

## 2022-01-10 DIAGNOSIS — Z87891 Personal history of nicotine dependence: Secondary | ICD-10-CM

## 2022-01-10 DIAGNOSIS — Z122 Encounter for screening for malignant neoplasm of respiratory organs: Secondary | ICD-10-CM

## 2022-01-10 DIAGNOSIS — F17210 Nicotine dependence, cigarettes, uncomplicated: Secondary | ICD-10-CM

## 2022-01-11 ENCOUNTER — Telehealth: Payer: Self-pay | Admitting: Cardiovascular Disease

## 2022-01-11 DIAGNOSIS — E1140 Type 2 diabetes mellitus with diabetic neuropathy, unspecified: Secondary | ICD-10-CM

## 2022-01-11 DIAGNOSIS — E1165 Type 2 diabetes mellitus with hyperglycemia: Secondary | ICD-10-CM

## 2022-01-11 MED ORDER — OZEMPIC (2 MG/DOSE) 8 MG/3ML ~~LOC~~ SOPN
2.0000 mg | PEN_INJECTOR | SUBCUTANEOUS | 4 refills | Status: DC
Start: 2022-01-11 — End: 2022-04-23

## 2022-01-11 NOTE — Telephone Encounter (Signed)
Pharmacy didn't start pt on Ozempic, looks like Dr Oval Linsey did back in January. Pt has called in asking about dose titrations since then and Gerald Stabs has provided input. If he's tolerating the '1mg'$  dose well, would recommend increasing to Ozempic '2mg'$  once weekly maintenance dose. ?

## 2022-01-11 NOTE — Telephone Encounter (Signed)
Pt c/o medication issue: ? ?1. Name of Medication:  ? Semaglutide, 1 MG/DOSE, (OZEMPIC, 1 MG/DOSE,) 4 MG/3ML SOPN  ? ? ?2. How are you currently taking this medication (dosage and times per day)? Inject 1 mg into the skin once a week ? ?3. Are you having a reaction (difficulty breathing--STAT)? No ? ?4. What is your medication issue? Pt states that doesn't know if he needs to continue taking the prescribed dose or if he is to increase dosage. Please advise ? ?

## 2022-01-11 NOTE — Telephone Encounter (Signed)
Per PharmD team,  ? ?"If he's tolerating the '1mg'$  dose well, would recommend increasing to Ozempic '2mg'$  once weekly maintenance dose."  ? ?Refill sent to pharmacy and updated patient  ?

## 2022-01-11 NOTE — Telephone Encounter (Signed)
It looks like yall helped to get him set up  ?

## 2022-01-11 NOTE — Telephone Encounter (Signed)
?*  STAT* If patient is at the pharmacy, call can be transferred to refill team. ? ? ?1. Which medications need to be refilled? (please list name of each medication and dose if known)  ? Semaglutide, 1 MG/DOSE, (OZEMPIC, 1 MG/DOSE,) 4 MG/3ML SOPN  ? ? ?2. Which pharmacy/location (including street and city if local pharmacy) is medication to be sent to?  ? Semaglutide, 1 MG/DOSE, (OZEMPIC, 1 MG/DOSE,) 4 MG/3ML SOPN  ? ? ?3. Do they need a 30 day or 90 day supply?  ?30 day ? ? ?

## 2022-01-11 NOTE — Telephone Encounter (Signed)
Addressed in other encounter 

## 2022-02-08 ENCOUNTER — Other Ambulatory Visit: Payer: Self-pay | Admitting: Cardiovascular Disease

## 2022-02-08 DIAGNOSIS — I10 Essential (primary) hypertension: Secondary | ICD-10-CM

## 2022-02-08 NOTE — Telephone Encounter (Signed)
Rx(s) sent to pharmacy electronically.  

## 2022-02-12 ENCOUNTER — Other Ambulatory Visit: Payer: Self-pay | Admitting: Urology

## 2022-03-04 ENCOUNTER — Telehealth: Payer: Self-pay | Admitting: *Deleted

## 2022-03-04 ENCOUNTER — Other Ambulatory Visit: Payer: Self-pay | Admitting: Orthopedic Surgery

## 2022-03-04 ENCOUNTER — Telehealth (HOSPITAL_BASED_OUTPATIENT_CLINIC_OR_DEPARTMENT_OTHER): Payer: Self-pay

## 2022-03-04 NOTE — Telephone Encounter (Signed)
S/w the pt about tele appt for pre op clearance. Pt states he wanted to see Dr. Oval Linsey for pre op clearance as well as the 04/2022 appt with her still. I explained to the pt that I did not have anything available with Dr. Oval Linsey before his surgery date. Pt stated to me that I wasn't really looking at the schedule and I just wanted him to set up the tele appt. I assured the pt I was looking at her schedule and I was at the end of July and still nothing available. Pt then said fine for the telephone appt. Pt has been scheduled for tele pre op appt 03/18/22 @ 1 pm. I will send FYI to requesting office the pt has appt 03/18/22.      Patient Consent for Virtual Visit        Miguel Brady has provided verbal consent on 03/04/2022 for a virtual visit (video or telephone).   CONSENT FOR VIRTUAL VISIT FOR:  Miguel Brady  By participating in this virtual visit I agree to the following:  I hereby voluntarily request, consent and authorize [facility] and its employed or contracted physicians, physician assistants, nurse practitioners or other licensed health care professionals (the Practitioner), to provide me with telemedicine health care services (the "Services") as deemed necessary by the treating Practitioner. I acknowledge and consent to receive the Services by the Practitioner via telemedicine. I understand that the telemedicine visit will involve communicating with the Practitioner through live audiovisual communication technology and the disclosure of certain medical information by electronic transmission. I acknowledge that I have been given the opportunity to request an in-person assessment or other available alternative prior to the telemedicine visit and am voluntarily participating in the telemedicine visit.  I understand that I have the right to withhold or withdraw my consent to the use of telemedicine in the course of my care at any time, without affecting my right to future care or  treatment, and that the Practitioner or I may terminate the telemedicine visit at any time. I understand that I have the right to inspect all information obtained and/or recorded in the course of the telemedicine visit and may receive copies of available information for a reasonable fee.  I understand that some of the potential risks of receiving the Services via telemedicine include:  Delay or interruption in medical evaluation due to technological equipment failure or disruption; Information transmitted may not be sufficient (e.g. poor resolution of images) to allow for appropriate medical decision making by the Practitioner; and/or  In rare instances, security protocols could fail, causing a breach of personal health information.  Furthermore, I acknowledge that it is my responsibility to provide information about my medical history, conditions and care that is complete and accurate to the best of my ability. I acknowledge that Practitioner's advice, recommendations, and/or decision may be based on factors not within their control, such as incomplete or inaccurate data provided by me or distortions of diagnostic images or specimens that may result from electronic transmissions. I understand that the practice of medicine is not an exact science and that Practitioner makes no warranties or guarantees regarding treatment outcomes. I acknowledge that a copy of this consent can be made available to me via my patient portal ([facility]), or I can request a printed copy by calling the office of [facility].    I understand that my insurance will be billed for this visit.   I have read or had this  consent read to me. I understand the contents of this consent, which adequately explains the benefits and risks of the Services being provided via telemedicine.  I have been provided ample opportunity to ask questions regarding this consent and the Services and have had my questions answered to my  satisfaction. I give my informed consent for the services to be provided through the use of telemedicine in my medical care

## 2022-03-04 NOTE — Telephone Encounter (Signed)
Preoperative team, please contact this patient and set up a phone call appointment for further cardiac evaluation.  Thank you for your help.  Jossie Ng. Taray Normoyle NP-C    03/04/2022, 3:41 PM [place] [address] Office [phone] Fax [phone]

## 2022-03-04 NOTE — Telephone Encounter (Signed)
   Pre-operative Risk Assessment    Patient Name: Miguel Brady  DOB: 16-Jan-1961 MRN: [id number]      Request for Surgical Clearance    Procedure:   Left Hip Remove Screws and Retained Femoral Nail  Date of Surgery:  Clearance 04/01/22                                 Surgeon:  Frederik Pear, MD Surgeon's Group or Practice Name:  [facility] Phone number:  [phone] Fax number:  [phone]   Type of Clearance Requested:   - Medical    Type of Anesthesia:  General    Additional requests/questions:  Please fax a copy of Medical Notes to the surgeon's office.  Barbaraann Faster   03/04/2022, 2:53 PM

## 2022-03-04 NOTE — Telephone Encounter (Signed)
S/w the pt about tele appt for pre op clearance. Pt states he wanted to see Dr. Oval Linsey for pre op clearance as well as the 04/2022 appt with her still. I explained to the pt that I did not have anything available with Dr. Oval Linsey before his surgery date. Pt stated to me that I wasn't really looking at the schedule and I just wanted him to set up the tele appt. I assured the pt I was looking at her schedule and I was at the end of July and still nothing available. Pt then said fine for the telephone appt. Pt has been scheduled for tele pre op appt 03/18/22 @ 1 pm. I will send FYI to requesting office the pt has appt 03/18/22.

## 2022-03-13 ENCOUNTER — Telehealth (HOSPITAL_BASED_OUTPATIENT_CLINIC_OR_DEPARTMENT_OTHER): Payer: Self-pay | Admitting: Cardiovascular Disease

## 2022-03-13 NOTE — Telephone Encounter (Signed)
Spoke with patient and advised needs to follow up with PCP regarding Ozempic and looks like refills available at [facility], confirmed with [facility]

## 2022-03-13 NOTE — Telephone Encounter (Signed)
*  STAT* If patient is at the pharmacy, call can be transferred to refill team.   1. Which medications need to be refilled? (please list name of each medication and dose if known) Semaglutide, 2 MG/DOSE, (OZEMPIC, 2 MG/DOSE,) 8 MG/3ML SOPN  2. Which pharmacy/location (including street and city if local pharmacy) is medication to be sent to? [place] [place]  3. Do they need a 30 day or 90 day supply? [place]

## 2022-03-18 ENCOUNTER — Encounter: Payer: Self-pay | Admitting: Nurse Practitioner

## 2022-03-18 ENCOUNTER — Ambulatory Visit (INDEPENDENT_AMBULATORY_CARE_PROVIDER_SITE_OTHER): Payer: Medicaid Other | Admitting: Nurse Practitioner

## 2022-03-18 DIAGNOSIS — Z01810 Encounter for preprocedural cardiovascular examination: Secondary | ICD-10-CM

## 2022-03-18 NOTE — Progress Notes (Signed)
Virtual Visit via Telephone Note   Because of Miguel Brady's co-morbid illnesses, he is at least at moderate risk for complications without adequate follow up.  This format is felt to be most appropriate for this patient at this time.  The patient did not have access to video technology/had technical difficulties with video requiring transitioning to audio format only (telephone).  All issues noted in this document were discussed and addressed.  No physical exam could be performed with this format.  Please refer to the patient's chart for his consent to telehealth for [facility].  Evaluation Performed:  Preoperative cardiovascular risk assessment _____________   Date:  03/18/2022   Patient ID:  Miguel Brady, DOB [birth year], MRN [id number] Patient Location:  Home Provider location:   Office  Primary Care Provider:  Lujean Amel, MD Primary Cardiologist:  Skeet Latch, MD  Chief Complaint / Patient Profile   61 y.o. y/o male with a h/o hypertension, hyperlipidemia, OSA, type 2 diabetes, obesity, GERD, and tobacco use who is pending left hip removal of screws and retained femoral nail with Dr.Frank Mayer Camel of [facility] on 04/01/2022 and presents today for telephonic preoperative cardiovascular risk assessment.  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  no inhaler   Substance abuse ([place])    ETOH   TOBACCO ABUSE 12/23/2008   TRIGGER FINGER  05/05/2007   Tuberculosis    pos PPD   Past Surgical History:  Procedure Laterality Date   CHEST TUBE INSERTION  04/03/2012   Procedure: CHEST TUBE INSERTION;  Surgeon: Zenovia Jarred, MD;  Location: [facility];  Service: General;  Laterality: Left;   CHOLECYSTECTOMY  07/28/2012   Procedure: LAPAROSCOPIC CHOLECYSTECTOMY;  Surgeon: Harl Bowie, MD;  Location: [facility];  Service: General;  Laterality: N/A;   EXTERNAL FIXATION LEG  04/03/2012   Procedure: EXTERNAL FIXATION LEG;  Surgeon: Rozanna Box, MD;  Location: [place];  Service: Orthopedics;  Laterality: Left;  Left femur   EXTERNAL FIXATION PELVIS  04/03/2012   Procedure: EXTERNAL FIXATION PELVIS;  Surgeon: Rozanna Box, MD;  Location: [place];  Service: Orthopedics;;   FEMUR IM NAIL  04/07/2012   Procedure: INTRAMEDULLARY (IM) NAIL FEMORAL;  Surgeon: Rozanna Box, MD;  Location: [place];  Service: Orthopedics;  Laterality: Left;   FLEXIBLE BRONCHOSCOPY  04/07/2012   Procedure: FLEXIBLE BRONCHOSCOPY;  Surgeon: Zenovia Jarred, MD;  Location: [place];  Service: General;;  START TIME=1645 END TIME=1700   INCISION AND DRAINAGE OF WOUND  04/03/2012   Procedure: IRRIGATION AND DEBRIDEMENT WOUND;  Surgeon: Otilio Connors, MD;  Location: [place];  Service: Neurosurgery;  Laterality: N/A;  Frontal.   ORIF PATELLA  04/07/2012   Procedure: OPEN REDUCTION INTERNAL (ORIF) FIXATION PATELLA;  Surgeon: Rozanna Box, MD;  Location: [place];  Service: Orthopedics;  Laterality: Left;   ORIF PELVIC FRACTURE  04/07/2012   Procedure: OPEN REDUCTION INTERNAL FIXATION (ORIF) PELVIC FRACTURE;  Surgeon: Rozanna Box, MD;  Location: [place];  Service: Orthopedics;  Laterality: N/A;  Right and left sacroiliac screw pinning,Irrigation and debridebridement open tibia and femur,removal external fixator.   TIBIA IM NAIL INSERTION  04/07/2012   Procedure: INTRAMEDULLARY (IM) NAIL TIBIAL;  Surgeon: Rozanna Box, MD;  Location: [place];  Service: Orthopedics;  Laterality:  Left;    Allergies  Allergies  Allergen Reactions   Shellfish Allergy Anaphylaxis   Iodine Rash   Penicillins Itching    Has patient had a PCN reaction causing immediate rash, facial/tongue/throat swelling, SOB or lightheadedness with hypotension: No Has patient had a PCN reaction causing severe rash involving mucus membranes or skin necrosis: No Has patient had a PCN reaction that required hospitalization No Has patient had a PCN reaction occurring within the last 10 years: No If all of the above answers are "NO", then may proceed with Cephalosporin use.    History of Present Illness    Miguel Brady is a 61 y.o. male who presents via audio/video conferencing for a telehealth visit today.  Pt was last seen in cardiology clinic on 10/16/2021 by Dr. Oval Linsey.  At that time Miguel Brady was doing well from a cardiac standpoint.The patient is now pending procedure as outlined above. Since his last visit, he has been stable from a cardiac standpoint. He notes occasional lightheadedness, he thinks this is related to his blood sugar. His BP has been well controlled. His activity is somewhat limited in the setting of orthopedic issues. He denies chest pain, palpitations, dyspnea, pnd, orthopnea, n, v, dizziness, syncope, edema, weight gain, or early satiety. All other systems reviewed and are otherwise negative except as noted above.   Home Medications    Prior to Admission medications   Medication Sig Start Date End Date Taking? Authorizing Provider  B-D 3CC LUER-LOK SYR 18GX1-1/2 18G X 1-1/2" 3 ML MISC Inject 1 Syringe into the skin See admin instructions. 11/21/20   [provider]  B-D 3CC LUER-LOK SYR 22GX1" 22G X 1" 3 ML MISC See admin instructions. 02/28/20   [provider]  carvedilol (COREG) 12.5 MG tablet Take 12.5 mg by mouth 2 (two) times daily with a meal.    [provider]  chlorthalidone (HYGROTON) 25 MG tablet TAKE 1 TABLET(25 MG) BY MOUTH DAILY.  DISCONTINUED FUROSEMIDE 02/08/22   Skeet Latch, MD  cholecalciferol (VITAMIN D) 1000 UNITS tablet Take 1,000 Units by mouth at bedtime.     [provider]  fluticasone (FLONASE) 50 MCG/ACT nasal spray Place 2 sprays into both nostrils as needed. 01/12/21   [provider]  gabapentin (NEURONTIN) 300 MG capsule Take 900 mg by mouth 3 (three) times daily. 02/07/20   [provider]  lisinopril (ZESTRIL) 10 MG tablet Take 10 mg by mouth daily.    [provider]  metFORMIN (GLUCOPHAGE) 1000 MG tablet Take 1,000 mg by mouth 2 (two) times daily with a meal. 07/07/15   [provider]  methocarbamol (ROBAXIN) 500 MG tablet Take 500 mg by mouth 2 (two) times daily.    [provider]  naloxone [facility]) nasal spray 4 mg/0.1 mL Place 1 spray into the nose daily as needed. 01/08/21   [provider]  pantoprazole (PROTONIX) 40 MG tablet Take 40 mg by mouth at bedtime.     [provider]  pravastatin (PRAVACHOL) 20 MG tablet Take 20 mg by mouth  at bedtime. 09/15/15   [provider]  Semaglutide, 2 MG/DOSE, (OZEMPIC, 2 MG/DOSE,) 8 MG/3ML SOPN Inject 2 mg into the skin once a week. 01/11/22   Skeet Latch, MD  sildenafil (VIAGRA) 100 MG tablet Take 100 mg by mouth daily as needed for erectile dysfunction.    [provider]  spironolactone (ALDACTONE) 25 MG tablet Take 1 tablet (25 mg total) by mouth daily. 01/23/21 03/04/22  Skeet Latch, MD  testosterone cypionate (DEPOTESTOSTERONE CYPIONATE) 200 MG/ML injection Inject 1 mL into the muscle every 28 (twenty-eight) days. 09/21/15   [provider]    Physical Exam    Vital Signs:  Miguel Brady does not have vital signs available for review today.  Given telephonic nature of communication, physical exam is limited. AAOx3. NAD. Normal affect.  Speech and respirations are unlabored.  Accessory Clinical Findings    None  Assessment & Plan     1.  Preoperative Cardiovascular Risk Assessment:  According to the Revised Cardiac Risk Index (RCRI), his Perioperative Risk of Major Cardiac Event is (%): 0.4. His Functional Capacity in METs is: 5.38 according to the [facility] Activity Status Index (DASI). Therefore, based on ACC/AHA guidelines, patient would be at acceptable risk for the planned procedure without further cardiovascular testing.    A copy of this note will be routed to requesting surgeon.  Time:   Today, I have spent 7 minutes with the patient with telehealth technology discussing medical history, symptoms, and management plan.     Lenna Sciara, NP  03/18/2022, 1:12 PM

## 2022-03-27 ENCOUNTER — Encounter (HOSPITAL_COMMUNITY): Payer: Self-pay

## 2022-03-27 ENCOUNTER — Encounter (HOSPITAL_COMMUNITY)
Admission: RE | Admit: 2022-03-27 | Discharge: 2022-03-27 | Disposition: A | Discharge status: Home / Self Care | Payer: Medicaid Other | Source: Ambulatory Visit | Attending: Orthopedic Surgery | Admitting: Orthopedic Surgery

## 2022-03-27 VITALS — BP 111/70 | HR 79 | Temp 98.7°F | Resp 14 | Ht 71.0 in | Wt 266.0 lb

## 2022-03-27 DIAGNOSIS — E119 Type 2 diabetes mellitus without complications: Secondary | ICD-10-CM | POA: Diagnosis not present

## 2022-03-27 DIAGNOSIS — I10 Essential (primary) hypertension: Secondary | ICD-10-CM | POA: Diagnosis not present

## 2022-03-27 DIAGNOSIS — Z01818 Encounter for other preprocedural examination: Secondary | ICD-10-CM | POA: Diagnosis not present

## 2022-03-27 LAB — CBC
HCT: 42.2 % (ref 39.0–52.0)
Hemoglobin: 13.2 g/dL (ref 13.0–17.0)
MCH: 30.8 pg (ref 26.0–34.0)
MCHC: 31.3 g/dL (ref 30.0–36.0)
MCV: 98.6 fL (ref 80.0–100.0)
Platelets: 216 10*3/uL (ref 150–400)
RBC: 4.28 MIL/uL (ref 4.22–5.81)
RDW: 14.4 % (ref 11.5–15.5)
WBC: 8.1 10*3/uL (ref 4.0–10.5)
nRBC: 0 % (ref 0.0–0.2)

## 2022-03-27 LAB — BASIC METABOLIC PANEL
Anion gap: 8 (ref 5–15)
BUN: 17 mg/dL (ref 6–20)
CO2: 25 mmol/L (ref 22–32)
Calcium: 9.5 mg/dL (ref 8.9–10.3)
Chloride: 104 mmol/L (ref 98–111)
Creatinine, Ser: 1.32 mg/dL — ABNORMAL HIGH (ref 0.61–1.24)
GFR, Estimated: >60 mL/min (ref >60)
Glucose, Bld: 84 mg/dL (ref 70–99)
Potassium: 3.9 mmol/L (ref 3.5–5.1)
Sodium: 137 mmol/L (ref 135–145)

## 2022-03-27 LAB — GLUCOSE, CAPILLARY: Glucose-Capillary: 90 mg/dL (ref 70–99)

## 2022-03-27 LAB — HEMOGLOBIN A1C
Hgb A1c MFr Bld: 5.8 % — ABNORMAL HIGH (ref 4.8–5.6)
Mean Plasma Glucose: 119.76 mg/dL

## 2022-03-27 NOTE — Patient Instructions (Addendum)
DUE TO COVID-19 ONLY TWO VISITORS  (aged 61 and older)  ARE ALLOWED TO COME WITH YOU AND STAY IN THE WAITING ROOM ONLY DURING PRE OP AND PROCEDURE.   **NO VISITORS ARE ALLOWED IN THE SHORT STAY AREA OR RECOVERY ROOM!!**   Your procedure is scheduled on: 04/01/22   Report to [facility] Main Entrance    Report to admitting at 5:15 AM   Call this number if you have problems the morning of surgery [phone]   Do not eat food :After Midnight.   After Midnight you may have the following liquids until 4:30 AM DAY OF SURGERY  Water Black Coffee (sugar ok, NO MILK/CREAM OR CREAMERS)  Tea (sugar ok, NO MILK/CREAM OR CREAMERS) regular and decaf                             Plain Jell-O (NO RED)                                           Fruit ices (not with fruit pulp, NO RED)                                     Popsicles (NO RED)                                                                  Juice: apple, WHITE grape, WHITE cranberry Sports drinks like Gatorade (NO RED) Clear broth(vegetable,chicken,beef     The day of surgery:  Drink ONE (1) Pre-Surgery G2 at 4:30 AM the morning of surgery. Drink in one sitting. Do not sip.  This drink was given to you during your hospital  pre-op appointment visit. Nothing else to drink after completing the  Pre-Surgery G2.          If you have questions, please contact your surgeon's office.   FOLLOW BOWEL PREP AND ANY ADDITIONAL PRE OP INSTRUCTIONS YOU RECEIVED FROM YOUR SURGEON'S OFFICE!!!     Oral Hygiene is also important to reduce your risk of infection.                                    Remember - BRUSH YOUR TEETH THE MORNING OF SURGERY WITH YOUR REGULAR TOOTHPASTE   Do NOT smoke after Midnight   Take these medicines the morning of surgery with A SIP OF WATER: Inhalers, Gabapentin, Oxycodone, Pantoprazole, Pravastatin  DO NOT TAKE ANY ORAL DIABETIC MEDICATIONS DAY OF YOUR SURGERY  How to Manage Your Diabetes Before and  After Surgery  Why is it important to control my blood sugar before and after surgery? Improving blood sugar levels before and after surgery helps healing and can limit problems. A way of improving blood sugar control is eating a healthy diet by:  Eating less sugar and carbohydrates  Increasing activity/exercise  Talking with your doctor about reaching your blood sugar goals High blood sugars (greater than 180 mg/dL) can raise your risk  of infections and slow your recovery, so you will need to focus on controlling your diabetes during the weeks before surgery. Make sure that the doctor who takes care of your diabetes knows about your planned surgery including the date and location.  How do I manage my blood sugar before surgery? Check your blood sugar at least 4 times a day, starting 2 days before surgery, to make sure that the level is not too high or low. Check your blood sugar the morning of your surgery when you wake up and every 2 hours until you get to the Short Stay unit. If your blood sugar is less than 70 mg/dL, you will need to treat for low blood sugar: Do not take insulin. Treat a low blood sugar (less than 70 mg/dL) with  cup of clear juice (cranberry or apple), 4 glucose tablets, OR glucose gel. Recheck blood sugar in 15 minutes after treatment (to make sure it is greater than 70 mg/dL). If your blood sugar is not greater than 70 mg/dL on recheck, call [phone] for further instructions. Report your blood sugar to the short stay nurse when you get to Short Stay.  If you are admitted to the hospital after surgery: Your blood sugar will be checked by the staff and you will probably be given insulin after surgery (instead of oral diabetes medicines) to make sure you have good blood sugar levels. The goal for blood sugar control after surgery is 80-180 mg/dL.   WHAT DO I DO ABOUT MY DIABETES MEDICATION?  Do not take oral diabetes medicines (pills) the morning of  surgery.  THE DAY BEFORE SURGERY, take Metformin as prescribed      THE MORNING OF SURGERY, do not take Metformin   The day of surgery, do not take other diabetes injectables, including Byetta (exenatide), Bydureon (exenatide ER), Victoza (liraglutide), or Trulicity (dulaglutide).  If your CBG is greater than 220 mg/dL, you may take  of your sliding scale  (correction) dose of insulin.   Reviewed and Endorsed by [facility] Patient Education Committee, August 2015                               You may not have any metal on your body including jewelry, and body piercing             Do not wear lotions, powders, cologne, or deodorant              Men may shave face and neck.   Do not bring valuables to the hospital. [place].  DO NOT [place]. PHARMACY WILL DISPENSE MEDICATIONS LISTED ON YOUR MEDICATION LIST TO YOU DURING YOUR ADMISSION [place]!    Patients discharged on the day of surgery will not be allowed to drive home.  Someone NEEDS to stay with you for the first 24 hours after anesthesia.              Please read over the following fact sheets you were given: IF YOU HAVE QUESTIONS ABOUT YOUR PRE-OP INSTRUCTIONS PLEASE CALL [place] - Preparing for Surgery Before surgery, you can play an important role.  Because skin is not sterile, your skin needs to be as free of germs as possible.  You can reduce  the number of germs on your skin by washing with CHG (chlorahexidine gluconate) soap before surgery.  CHG is an antiseptic cleaner which kills germs and bonds with the skin to continue killing germs even after washing. Please DO NOT use if you have an allergy to CHG or antibacterial soaps.  If your skin becomes reddened/irritated stop using the CHG and inform your nurse when you arrive at Short Stay. Do not shave (including legs and underarms) for at least 48 hours  prior to the first CHG shower.  You may shave your face/neck.  Please follow these instructions carefully:  1.  Shower with CHG Soap the night before surgery and the  morning of surgery.  2.  If you choose to wash your hair, wash your hair first as usual with your normal  shampoo.  3.  After you shampoo, rinse your hair and body thoroughly to remove the shampoo.                             4.  Use CHG as you would any other liquid soap.  You can apply chg directly to the skin and wash.  Gently with a scrungie or clean washcloth.  5.  Apply the CHG Soap to your body ONLY FROM THE NECK DOWN.   Do   not use on face/ open                           Wound or open sores. Avoid contact with eyes, ears mouth and   genitals (private parts).                       Wash face,  Genitals (private parts) with your normal soap.             6.  Wash thoroughly, paying special attention to the area where your    surgery  will be performed.  7.  Thoroughly rinse your body with warm water from the neck down.  8.  DO NOT shower/wash with your normal soap after using and rinsing off the CHG Soap.                9.  Pat yourself dry with a clean towel.            10.  Wear clean pajamas.            11.  Place clean sheets on your bed the night of your first shower and do not  sleep with pets. Day of Surgery : Do not apply any lotions/deodorants the morning of surgery.  Please wear clean clothes to the hospital/surgery center.  FAILURE TO FOLLOW THESE INSTRUCTIONS MAY RESULT IN THE CANCELLATION OF YOUR SURGERY  PATIENT SIGNATURE_________________________________  NURSE SIGNATURE__________________________________  ________________________________________________________________________   Miguel Brady  An incentive spirometer is a tool that can help keep your lungs clear and active. This tool measures how well you are filling your lungs with each breath. Taking long deep breaths may help reverse or decrease  the chance of developing breathing (pulmonary) problems (especially infection) following: A long period of time when you are unable to move or be active. BEFORE THE PROCEDURE  If the spirometer includes an indicator to show your best effort, your nurse or respiratory therapist will set it to a desired goal. If possible, sit up straight or lean slightly forward. Try not to slouch.  Hold the incentive spirometer in an upright position. INSTRUCTIONS FOR USE  Sit on the edge of your bed if possible, or sit up as far as you can in bed or on a chair. Hold the incentive spirometer in an upright position. Breathe out normally. Place the mouthpiece in your mouth and seal your lips tightly around it. Breathe in slowly and as deeply as possible, raising the piston or the ball toward the top of the column. Hold your breath for 3-5 seconds or for as long as possible. Allow the piston or ball to fall to the bottom of the column. Remove the mouthpiece from your mouth and breathe out normally. Rest for a few seconds and repeat Steps 1 through 7 at least 10 times every 1-2 hours when you are awake. Take your time and take a few normal breaths between deep breaths. The spirometer may include an indicator to show your best effort. Use the indicator as a goal to work toward during each repetition. After each set of 10 deep breaths, practice coughing to be sure your lungs are clear. If you have an incision (the cut made at the time of surgery), support your incision when coughing by placing a pillow or rolled up towels firmly against it. Once you are able to get out of bed, walk around indoors and cough well. You may stop using the incentive spirometer when instructed by your caregiver.  RISKS AND COMPLICATIONS Take your time so you do not get dizzy or light-headed. If you are in pain, you may need to take or ask for pain medication before doing incentive spirometry. It is harder to take a deep breath if you are  having pain. AFTER USE Rest and breathe slowly and easily. It can be helpful to keep track of a log of your progress. Your caregiver can provide you with a simple table to help with this. If you are using the spirometer at home, follow these instructions: [place] IF:  You are having difficultly using the spirometer. You have trouble using the spirometer as often as instructed. Your pain medication is not giving enough relief while using the spirometer. You develop fever of 100.5 F (38.1 C) or higher. SEEK IMMEDIATE MEDICAL CARE IF:  You cough up bloody sputum that had not been present before. You develop fever of 102 F (38.9 C) or greater. You develop worsening pain at or near the incision site. MAKE SURE YOU:  Understand these instructions. Will watch your condition. Will get help right away if you are not doing well or get worse. Document Released: 01/13/2007 Document Revised: 11/25/2011 Document Reviewed: 03/16/2007 [facility] Patient Information 2014 [place], [place].   ________________________________________________________________________

## 2022-03-27 NOTE — Progress Notes (Addendum)
COVID Vaccine Completed: yes  Date of COVID positive in last 90 days: no  PCP - Dibas Koirala, MD Cardiologist - Skeet Latch, MD  Cardiac clearance by Diona Browner 03/18/22 in Epic  Chest x-ray - CT 01/09/22 Epic EKG - 03/27/22 Epic/chart Stress Test - 11/17/20 Epic ECHO - 07/21/20 Epic Cardiac Cath - n/a Pacemaker/ICD device last checked: n/a Spinal Cord Stimulator: n/a  Bowel Prep - no  Sleep Study - yes, negative CPAP -   Fasting Blood Sugar - no checks at home Checks Blood Sugar _____ times a day  Blood Thinner Instructions:  n/a Aspirin Instructions: Last Dose:  Activity level: Can go up a flight of stairs and perform activities of daily living without stopping and without symptoms of chest pain or shortness of breath.   Anesthesia review:   Patient denies shortness of breath, fever, cough and chest pain at PAT appointment  Patient verbalized understanding of instructions that were given to them at the PAT appointment. Patient was also instructed that they will need to review over the PAT instructions again at home before surgery.

## 2022-03-29 DIAGNOSIS — M12552 Traumatic arthropathy, left hip: Secondary | ICD-10-CM | POA: Diagnosis present
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
## 2022-03-31 MED ORDER — VANCOMYCIN HCL 1500 MG/300ML IV SOLN
1500.0000 mg | INTRAVENOUS | Status: DC
  Filled 2022-03-31: qty 300

## 2022-04-01 ENCOUNTER — Encounter (HOSPITAL_COMMUNITY): Payer: Self-pay | Admitting: Orthopedic Surgery

## 2022-04-01 ENCOUNTER — Other Ambulatory Visit: Payer: Self-pay

## 2022-04-01 ENCOUNTER — Ambulatory Visit (HOSPITAL_COMMUNITY): Payer: Medicaid Other

## 2022-04-01 ENCOUNTER — Ambulatory Visit (HOSPITAL_COMMUNITY)
Admission: RE | Admit: 2022-04-01 | Discharge: 2022-04-01 | Disposition: A | Discharge status: Home / Self Care | Payer: Medicaid Other | Attending: Orthopedic Surgery | Admitting: Orthopedic Surgery

## 2022-04-01 ENCOUNTER — Ambulatory Visit (HOSPITAL_BASED_OUTPATIENT_CLINIC_OR_DEPARTMENT_OTHER): Payer: Medicaid Other | Admitting: Anesthesiology

## 2022-04-01 ENCOUNTER — Encounter (HOSPITAL_COMMUNITY)
Admission: RE | Disposition: A | Discharge status: Home / Self Care | Payer: Self-pay | Source: Home / Self Care | Attending: Orthopedic Surgery

## 2022-04-01 ENCOUNTER — Ambulatory Visit (HOSPITAL_COMMUNITY): Payer: Medicaid Other | Admitting: Anesthesiology

## 2022-04-01 DIAGNOSIS — I10 Essential (primary) hypertension: Secondary | ICD-10-CM | POA: Diagnosis not present

## 2022-04-01 DIAGNOSIS — E1140 Type 2 diabetes mellitus with diabetic neuropathy, unspecified: Secondary | ICD-10-CM | POA: Insufficient documentation

## 2022-04-01 DIAGNOSIS — Z9884 Bariatric surgery status: Secondary | ICD-10-CM | POA: Diagnosis not present

## 2022-04-01 DIAGNOSIS — Z472 Encounter for removal of internal fixation device: Secondary | ICD-10-CM | POA: Diagnosis not present

## 2022-04-01 DIAGNOSIS — I129 Hypertensive chronic kidney disease with stage 1 through stage 4 chronic kidney disease, or unspecified chronic kidney disease: Secondary | ICD-10-CM | POA: Diagnosis not present

## 2022-04-01 DIAGNOSIS — E119 Type 2 diabetes mellitus without complications: Secondary | ICD-10-CM

## 2022-04-01 DIAGNOSIS — Z7984 Long term (current) use of oral hypoglycemic drugs: Secondary | ICD-10-CM

## 2022-04-01 DIAGNOSIS — T8484XA Pain due to internal orthopedic prosthetic devices, implants and grafts, initial encounter: Secondary | ICD-10-CM | POA: Diagnosis present

## 2022-04-01 DIAGNOSIS — Z6839 Body mass index (BMI) 39.0-39.9, adult: Secondary | ICD-10-CM | POA: Insufficient documentation

## 2022-04-01 DIAGNOSIS — K219 Gastro-esophageal reflux disease without esophagitis: Secondary | ICD-10-CM | POA: Diagnosis not present

## 2022-04-01 DIAGNOSIS — G4733 Obstructive sleep apnea (adult) (pediatric): Secondary | ICD-10-CM | POA: Insufficient documentation

## 2022-04-01 DIAGNOSIS — E1122 Type 2 diabetes mellitus with diabetic chronic kidney disease: Secondary | ICD-10-CM

## 2022-04-01 DIAGNOSIS — N189 Chronic kidney disease, unspecified: Secondary | ICD-10-CM

## 2022-04-01 DIAGNOSIS — Z87820 Personal history of traumatic brain injury: Secondary | ICD-10-CM | POA: Insufficient documentation

## 2022-04-01 DIAGNOSIS — M1612 Unilateral primary osteoarthritis, left hip: Secondary | ICD-10-CM | POA: Diagnosis not present

## 2022-04-01 DIAGNOSIS — F17210 Nicotine dependence, cigarettes, uncomplicated: Secondary | ICD-10-CM | POA: Diagnosis not present

## 2022-04-01 DIAGNOSIS — M12552 Traumatic arthropathy, left hip: Secondary | ICD-10-CM | POA: Diagnosis present

## 2022-04-01 HISTORY — PX: HARDWARE REMOVAL: SHX979

## 2022-04-01 LAB — GLUCOSE, CAPILLARY
Glucose-Capillary: 97 mg/dL (ref 70–99)
Glucose-Capillary: 121 mg/dL — ABNORMAL HIGH (ref 70–99)

## 2022-04-01 SURGERY — REMOVAL, HARDWARE
Anesthesia: General | Laterality: Left

## 2022-04-01 MED ORDER — ROCURONIUM BROMIDE 10 MG/ML (PF) SYRINGE
PREFILLED_SYRINGE | INTRAVENOUS | Status: DC | PRN
  Administered 2022-04-01: 10 mg via INTRAVENOUS
  Administered 2022-04-01: 50 mg via INTRAVENOUS

## 2022-04-01 MED ORDER — FENTANYL CITRATE (PF) 100 MCG/2ML IJ SOLN
INTRAMUSCULAR | Status: DC | PRN
  Administered 2022-04-01 (×5): 50 ug via INTRAVENOUS
  Administered 2022-04-01: 100 ug via INTRAVENOUS
  Administered 2022-04-01: 50 ug via INTRAVENOUS

## 2022-04-01 MED ORDER — METHOCARBAMOL 750 MG PO TABS: 750.0000 mg | ORAL_TABLET | Freq: Four times a day (QID) | ORAL | 0 refills | Status: DC

## 2022-04-01 MED ORDER — BUPIVACAINE-EPINEPHRINE (PF) 0.25% -1:200000 IJ SOLN
INTRAMUSCULAR | Status: DC | PRN
  Administered 2022-04-01: 30 mL

## 2022-04-01 MED ORDER — SUCCINYLCHOLINE CHLORIDE 200 MG/10ML IV SOSY
PREFILLED_SYRINGE | INTRAVENOUS | Status: DC | PRN
  Administered 2022-04-01: 100 mg via INTRAVENOUS

## 2022-04-01 MED ORDER — PHENYLEPHRINE 80 MCG/ML (10ML) SYRINGE FOR IV PUSH (FOR BLOOD PRESSURE SUPPORT)
PREFILLED_SYRINGE | INTRAVENOUS | Status: AC
  Filled 2022-04-01: qty 10

## 2022-04-01 MED ORDER — KETAMINE HCL 10 MG/ML IJ SOLN
INTRAMUSCULAR | Status: DC | PRN
  Administered 2022-04-01: 50 mg via INTRAVENOUS

## 2022-04-01 MED ORDER — ASPIRIN 81 MG PO TBEC: 81.0000 mg | DELAYED_RELEASE_TABLET | Freq: Two times a day (BID) | ORAL | 0 refills | Status: DC

## 2022-04-01 MED ORDER — OXYCODONE-ACETAMINOPHEN 10-325 MG PO TABS: 1.0000 | ORAL_TABLET | ORAL | 0 refills | Status: DC | PRN

## 2022-04-01 MED ORDER — FENTANYL CITRATE PF 50 MCG/ML IJ SOSY
PREFILLED_SYRINGE | INTRAMUSCULAR | Status: AC
  Administered 2022-04-01: 50 ug via INTRAVENOUS
  Filled 2022-04-01: qty 3

## 2022-04-01 MED ORDER — PHENYLEPHRINE 80 MCG/ML (10ML) SYRINGE FOR IV PUSH (FOR BLOOD PRESSURE SUPPORT)
PREFILLED_SYRINGE | INTRAVENOUS | Status: DC | PRN
  Administered 2022-04-01 (×2): 80 ug via INTRAVENOUS

## 2022-04-01 MED ORDER — POVIDONE-IODINE 10 % EX SWAB: 2.0000 | Freq: Once | CUTANEOUS | Status: DC

## 2022-04-01 MED ORDER — MIDAZOLAM HCL 2 MG/2ML IJ SOLN
INTRAMUSCULAR | Status: AC
  Filled 2022-04-01: qty 2

## 2022-04-01 MED ORDER — LIDOCAINE 2% (20 MG/ML) 5 ML SYRINGE
INTRAMUSCULAR | Status: DC | PRN
  Administered 2022-04-01: 60 mg via INTRAVENOUS

## 2022-04-01 MED ORDER — ACETAMINOPHEN 500 MG PO TABS
1000.0000 mg | ORAL_TABLET | Freq: Once | ORAL | Status: AC
  Administered 2022-04-01: 1000 mg via ORAL
  Filled 2022-04-01: qty 2

## 2022-04-01 MED ORDER — FENTANYL CITRATE (PF) 100 MCG/2ML IJ SOLN
INTRAMUSCULAR | Status: AC
  Filled 2022-04-01: qty 2

## 2022-04-01 MED ORDER — PROPOFOL 1000 MG/100ML IV EMUL
INTRAVENOUS | Status: AC
  Filled 2022-04-01: qty 100

## 2022-04-01 MED ORDER — BUPIVACAINE LIPOSOME 1.3 % IJ SUSP
INTRAMUSCULAR | Status: DC | PRN
  Administered 2022-04-01: 20 mL

## 2022-04-01 MED ORDER — BUPIVACAINE LIPOSOME 1.3 % IJ SUSP
INTRAMUSCULAR | Status: AC
  Filled 2022-04-01: qty 20

## 2022-04-01 MED ORDER — METHOCARBAMOL 500 MG PO TABS: 500.0000 mg | ORAL_TABLET | Freq: Four times a day (QID) | ORAL | Status: DC | PRN

## 2022-04-01 MED ORDER — BUPIVACAINE-EPINEPHRINE (PF) 0.25% -1:200000 IJ SOLN
INTRAMUSCULAR | Status: AC
  Filled 2022-04-01: qty 30

## 2022-04-01 MED ORDER — MIDAZOLAM HCL 2 MG/2ML IJ SOLN
INTRAMUSCULAR | Status: DC | PRN
  Administered 2022-04-01: 2 mg via INTRAVENOUS

## 2022-04-01 MED ORDER — AMISULPRIDE (ANTIEMETIC) 5 MG/2ML IV SOLN: 10.0000 mg | Freq: Once | INTRAVENOUS | Status: DC | PRN

## 2022-04-01 MED ORDER — DEXMEDETOMIDINE HCL IN NACL 80 MCG/20ML IV SOLN
INTRAVENOUS | Status: AC
  Filled 2022-04-01: qty 20

## 2022-04-01 MED ORDER — AMISULPRIDE (ANTIEMETIC) 5 MG/2ML IV SOLN
INTRAVENOUS | Status: AC
  Filled 2022-04-01: qty 4

## 2022-04-01 MED ORDER — DEXAMETHASONE SODIUM PHOSPHATE 10 MG/ML IJ SOLN
INTRAMUSCULAR | Status: AC
  Filled 2022-04-01: qty 1

## 2022-04-01 MED ORDER — PROPOFOL 10 MG/ML IV BOLUS
INTRAVENOUS | Status: DC | PRN
  Administered 2022-04-01: 200 mg via INTRAVENOUS

## 2022-04-01 MED ORDER — STERILE WATER FOR IRRIGATION IR SOLN
Status: DC | PRN
  Administered 2022-04-01: 2000 mL

## 2022-04-01 MED ORDER — FENTANYL CITRATE PF 50 MCG/ML IJ SOSY: 25.0000 ug | PREFILLED_SYRINGE | INTRAMUSCULAR | Status: DC | PRN

## 2022-04-01 MED ORDER — VANCOMYCIN HCL 1000 MG IV SOLR
INTRAVENOUS | Status: AC
  Filled 2022-04-01: qty 20

## 2022-04-01 MED ORDER — LACTATED RINGERS IV SOLN: INTRAVENOUS | Status: DC

## 2022-04-01 MED ORDER — SUGAMMADEX SODIUM 500 MG/5ML IV SOLN
INTRAVENOUS | Status: AC
  Filled 2022-04-01: qty 5

## 2022-04-01 MED ORDER — LIDOCAINE HCL (PF) 2 % IJ SOLN
INTRAMUSCULAR | Status: AC
  Filled 2022-04-01: qty 5

## 2022-04-01 MED ORDER — CHLORHEXIDINE GLUCONATE 0.12 % MT SOLN
15.0000 mL | Freq: Once | OROMUCOSAL | Status: AC
  Administered 2022-04-01: 15 mL via OROMUCOSAL

## 2022-04-01 MED ORDER — ONDANSETRON HCL 4 MG/2ML IJ SOLN
INTRAMUSCULAR | Status: AC
  Filled 2022-04-01: qty 2

## 2022-04-01 MED ORDER — CEFAZOLIN IN SODIUM CHLORIDE 3-0.9 GM/100ML-% IV SOLN
3.0000 g | Freq: Once | INTRAVENOUS | Status: AC
  Administered 2022-04-01: 3 g via INTRAVENOUS

## 2022-04-01 MED ORDER — KETAMINE HCL 50 MG/5ML IJ SOSY
PREFILLED_SYRINGE | INTRAMUSCULAR | Status: AC
  Filled 2022-04-01: qty 5

## 2022-04-01 MED ORDER — ONDANSETRON HCL 4 MG/2ML IJ SOLN
INTRAMUSCULAR | Status: DC | PRN
  Administered 2022-04-01: 4 mg via INTRAVENOUS

## 2022-04-01 MED ORDER — DEXAMETHASONE SODIUM PHOSPHATE 10 MG/ML IJ SOLN
INTRAMUSCULAR | Status: DC | PRN
  Administered 2022-04-01: 10 mg via INTRAVENOUS

## 2022-04-01 MED ORDER — DEXMEDETOMIDINE (PRECEDEX) IN NS 20 MCG/5ML (4 MCG/ML) IV SYRINGE
PREFILLED_SYRINGE | INTRAVENOUS | Status: DC | PRN
  Administered 2022-04-01: 8 ug via INTRAVENOUS
  Administered 2022-04-01: 12 ug via INTRAVENOUS

## 2022-04-01 MED ORDER — 0.9 % SODIUM CHLORIDE (POUR BTL) OPTIME
TOPICAL | Status: DC | PRN
  Administered 2022-04-01: 1000 mL

## 2022-04-01 MED ORDER — CEFAZOLIN IN SODIUM CHLORIDE 3-0.9 GM/100ML-% IV SOLN
INTRAVENOUS | Status: AC
  Filled 2022-04-01: qty 100

## 2022-04-01 MED ORDER — PROPOFOL 10 MG/ML IV BOLUS
INTRAVENOUS | Status: AC
  Filled 2022-04-01: qty 20

## 2022-04-01 MED ORDER — SODIUM CHLORIDE (PF) 0.9 % IJ SOLN
INTRAMUSCULAR | Status: AC
  Filled 2022-04-01: qty 10

## 2022-04-01 MED ORDER — ORAL CARE MOUTH RINSE: 15.0000 mL | Freq: Once | OROMUCOSAL | Status: AC

## 2022-04-01 MED ORDER — METHOCARBAMOL 500 MG IVPB - SIMPLE MED: 500.0000 mg | Freq: Four times a day (QID) | INTRAVENOUS | Status: DC | PRN

## 2022-04-01 SURGICAL SUPPLY — 68 items
APL PRP STRL LF DISP 70% ISPRP (MISCELLANEOUS) ×1
BAG COUNTER SPONGE SURGICOUNT (BAG) IMPLANT
BAG ISL DRAPE 18X18 STRL (DRAPES) ×1
BAG ISOLATION DRAPE 18X18 (DRAPES) IMPLANT
BAG SPEC THK2 15X12 ZIP CLS (MISCELLANEOUS)
BAG SPNG CNTER NS LX DISP (BAG)
BAG ZIPLOCK 12X15 (MISCELLANEOUS) ×2 IMPLANT
BLADE EXTENDED COATED 6.5IN (ELECTRODE) ×1 IMPLANT
BNDG CMPR 5X6 CHSV STRCH STRL (GAUZE/BANDAGES/DRESSINGS) ×2
BNDG COHESIVE 4X5 TAN ST LF (GAUZE/BANDAGES/DRESSINGS) ×1 IMPLANT
BNDG COHESIVE 6X5 TAN ST LF (GAUZE/BANDAGES/DRESSINGS) ×4 IMPLANT
BNDG ELASTIC 6X5.8 VLCR STR LF (GAUZE/BANDAGES/DRESSINGS) ×1 IMPLANT
BNDG GAUZE DERMACEA FLUFF (GAUZE/BANDAGES/DRESSINGS) ×1
BNDG GAUZE DERMACEA FLUFF 4 (GAUZE/BANDAGES/DRESSINGS) ×2 IMPLANT
BNDG GZE DERMACEA 4 6PLY (GAUZE/BANDAGES/DRESSINGS) ×1
BUR CROSS CUT FISSURE 1.6 (BURR) ×1 IMPLANT
BUR OVAL CARBIDE 4.0 (BURR) ×1 IMPLANT
CHLORAPREP W/TINT 26 (MISCELLANEOUS) ×3 IMPLANT
COVER BACK TABLE 60X90IN (DRAPES) ×3 IMPLANT
COVER MAYO STAND STRL (DRAPES) ×3 IMPLANT
COVER SURGICAL LIGHT HANDLE (MISCELLANEOUS) ×3 IMPLANT
CUFF TOURN SGL QUICK 24 (TOURNIQUET CUFF)
CUFF TOURN SGL QUICK 34 (TOURNIQUET CUFF)
CUFF TRNQT CYL 24X4X16.5-23 (TOURNIQUET CUFF) ×2 IMPLANT
CUFF TRNQT CYL 34X4.125X (TOURNIQUET CUFF) ×2 IMPLANT
DRAPE 3/4 80X56 (DRAPES) ×6 IMPLANT
DRAPE C-ARM 42X120 X-RAY (DRAPES) ×3 IMPLANT
DRAPE C-ARMOR (DRAPES) ×3 IMPLANT
DRAPE ISOLATION BAG 18X18 (DRAPES) ×2
DRAPE OEC MINIVIEW 54X84 (DRAPES) ×2 IMPLANT
DRAPE ORTHO SPLIT 77X108 STRL (DRAPES) ×4
DRAPE SURG ORHT 6 SPLT 77X108 (DRAPES) IMPLANT
DRAPE U-SHAPE 47X51 STRL (DRAPES) ×3 IMPLANT
DRSG AQUACEL AG ADV 3.5X 4 (GAUZE/BANDAGES/DRESSINGS) ×2 IMPLANT
DRSG PAD ABDOMINAL 8X10 ST (GAUZE/BANDAGES/DRESSINGS) ×3 IMPLANT
DRSG TEGADERM 6X8 (GAUZE/BANDAGES/DRESSINGS) ×2 IMPLANT
ELECT REM PT RETURN 15FT ADLT (MISCELLANEOUS) ×3 IMPLANT
FACESHIELD WRAPAROUND (MASK) ×12 IMPLANT
FACESHIELD WRAPAROUND OR TEAM (MASK) ×2 IMPLANT
GAUZE SPONGE 4X4 12PLY STRL (GAUZE/BANDAGES/DRESSINGS) ×3 IMPLANT
GLOVE BIO SURGEON STRL SZ7.5 (GLOVE) ×3 IMPLANT
GLOVE BIO SURGEON STRL SZ8.5 (GLOVE) ×3 IMPLANT
GLOVE BIOGEL PI IND STRL 8 (GLOVE) ×2 IMPLANT
GLOVE BIOGEL PI IND STRL 9 (GLOVE) ×2 IMPLANT
GLOVE BIOGEL PI INDICATOR 8 (GLOVE) ×1
GLOVE BIOGEL PI INDICATOR 9 (GLOVE) ×1
GOWN STRL REUS W/ TWL XL LVL3 (GOWN DISPOSABLE) ×4 IMPLANT
GOWN STRL REUS W/TWL XL LVL3 (GOWN DISPOSABLE) ×4
KIT TURNOVER KIT A (KITS) IMPLANT
NDL SPNL 22GX3.5 QUINCKE BK (NEEDLE) ×2 IMPLANT
NEEDLE HYPO 22GX1.5 SAFETY (NEEDLE) ×1 IMPLANT
NEEDLE SPNL 22GX3.5 QUINCKE BK (NEEDLE) ×2 IMPLANT
PACK ORTHO EXTREMITY (CUSTOM PROCEDURE TRAY) ×3 IMPLANT
PAD CAST 3X4 CTTN HI CHSV (CAST SUPPLIES) ×2 IMPLANT
PAD CAST 4YDX4 CTTN HI CHSV (CAST SUPPLIES) ×4 IMPLANT
PADDING CAST COTTON 3X4 STRL (CAST SUPPLIES) ×2
PADDING CAST COTTON 4X4 STRL (CAST SUPPLIES) ×4
PENCIL SMOKE EVACUATOR (MISCELLANEOUS) ×1 IMPLANT
PROTECTOR NERVE ULNAR (MISCELLANEOUS) ×3 IMPLANT
REAMER ONE STEP 12.2MM (BIT) ×1 IMPLANT
SPONGE T-LAP 4X18 ~~LOC~~+RFID (SPONGE) ×3 IMPLANT
STOCKINETTE 8 INCH (MISCELLANEOUS) ×4 IMPLANT
SUT ETHILON 3 0 PS 1 (SUTURE) ×3 IMPLANT
SUT VIC AB 3-0 CT1 27 (SUTURE) ×4
SUT VIC AB 3-0 CT1 TAPERPNT 27 (SUTURE) ×2 IMPLANT
SYR CONTROL 10ML LL (SYRINGE) ×1 IMPLANT
TIP THREADED COCR 32.X460 (TIP) ×1 IMPLANT
TOWEL OR 17X26 10 PK STRL BLUE (TOWEL DISPOSABLE) ×5 IMPLANT

## 2022-04-01 NOTE — Anesthesia Procedure Notes (Signed)
Procedure Name: Intubation Date/Time: 04/01/2022 7:21 AM  Performed by: Sharlette Dense, CRNAPatient Re-evaluated:Patient Re-evaluated prior to induction Oxygen Delivery Method: Circle system utilized Preoxygenation: Pre-oxygenation with 100% oxygen Induction Type: Rapid sequence and Cricoid Pressure applied Laryngoscope Size: Miller and 3 Grade View: Grade I Tube size: 8.0 mm Number of attempts: 1 Airway Equipment and Method: Stylet Placement Confirmation: ETT inserted through vocal cords under direct vision, breath sounds checked- equal and bilateral and positive ETCO2 Secured at: 22 cm Tube secured with: Tape Dental Injury: Teeth and Oropharynx as per pre-operative assessment

## 2022-04-01 NOTE — Transfer of Care (Signed)
Immediate Anesthesia Transfer of Care Note  Patient: Miguel Brady  Procedure(s) Performed: LEFT HIP REMOVE SCREWS AND RETAINED FEMORAL NAIL (Left)  Patient Location: PACU  Anesthesia Type:General  Level of Consciousness: drowsy  Airway & Oxygen Therapy: Patient Spontanous Breathing and Patient connected to face mask oxygen  Post-op Assessment: Report given to RN and Post -op Vital signs reviewed and stable  Post vital signs: Reviewed and stable  Last Vitals:  Vitals Value Taken Time  BP 138/77 04/01/22 1101  Temp    Pulse 114 04/01/22 1104  Resp 20 04/01/22 1104  SpO2 93 % 04/01/22 1104  Vitals shown include unvalidated device data.  Last Pain:  Vitals:   04/01/22 0602  TempSrc: Oral  PainSc:       Patients Stated Pain Goal: 4 (49/20/10 0712)  Complications: No notable events documented.
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
## 2022-04-01 NOTE — Anesthesia Preprocedure Evaluation (Addendum)
Anesthesia Evaluation  Patient identified by MRN, date of birth, ID band Patient awake    Reviewed: Allergy & Precautions, NPO status , Patient's Chart, lab work & pertinent test results  Airway Mallampati: II  TM Distance: >3 FB Neck ROM: Full    Dental  (+) Dental Advisory Given   Pulmonary sleep apnea , Current Smoker,    breath sounds clear to auscultation       Cardiovascular hypertension, Pt. on medications  Rhythm:Regular Rate:Normal     Neuro/Psych  Headaches,  Neuromuscular disease    GI/Hepatic GERD  ,(+)     substance abuse  ,   Endo/Other  diabetes, Type 2, Oral Hypoglycemic Agents  Renal/GU CRFRenal disease     Musculoskeletal  (+) Arthritis ,   Abdominal   Peds  Hematology negative hematology ROS (+)   Anesthesia Other Findings   Reproductive/Obstetrics                             Lab Results  Component Value Date   WBC 8.1 03/27/2022   HGB 13.2 03/27/2022   HCT 42.2 03/27/2022   MCV 98.6 03/27/2022   PLT 216 03/27/2022   Lab Results  Component Value Date   CREATININE 1.32 (H) 03/27/2022   BUN 17 03/27/2022   NA 137 03/27/2022   K 3.9 03/27/2022   CL 104 03/27/2022   CO2 25 03/27/2022    Anesthesia Physical Anesthesia Plan  ASA: 3  Anesthesia Plan: General   Post-op Pain Management: Tylenol PO (pre-op)* and Ketamine IV*   Induction: Intravenous  PONV Risk Score and Plan: 1 and Ondansetron, Dexamethasone, Treatment may vary due to age or medical condition and Midazolam  Airway Management Planned: Oral ETT  Additional Equipment: None  Intra-op Plan:   Post-operative Plan: Extubation in OR  Informed Consent: I have reviewed the patients History and Physical, chart, labs and discussed the procedure including the risks, benefits and alternatives for the proposed anesthesia with the patient or authorized representative who has indicated his/her  understanding and acceptance.     Dental advisory given  Plan Discussed with: CRNA  Anesthesia Plan Comments:        Anesthesia Quick Evaluation

## 2022-04-01 NOTE — Discharge Instructions (Addendum)
INSTRUCTIONS AFTER SURGERY  Remove items at home which could result in a fall. This includes throw rugs or furniture in walking pathways ICE to the affected joint every three hours while awake for 30 minutes at a time, for at least the first 3-5 days, and then as needed for pain and swelling.  Continue to use ice for pain and swelling. You may notice swelling that will progress down to the foot and ankle.  This is normal after surgery.  Elevate your leg when you are not up walking on it.   Continue to use the breathing machine you got in the hospital (incentive spirometer) which will help keep your temperature down.  It is common for your temperature to cycle up and down following surgery, especially at night when you are not up moving around and exerting yourself.  The breathing machine keeps your lungs expanded and your temperature down.   DIET:  As you were doing prior to hospitalization, we recommend a well-balanced diet.  DRESSING / WOUND CARE / SHOWERING  Keep the surgical dressing until follow up.  The dressing is water proof, so you can shower without any extra covering.  IF THE DRESSING FALLS OFF or the wound gets wet inside, change the dressing with sterile gauze.  Please use good hand washing techniques before changing the dressing.  Do not use any lotions or creams on the incision until instructed by your surgeon.    ACTIVITY  Increase activity slowly as tolerated, but follow the weight bearing instructions below.   No driving for 6 weeks or until further direction given by your physician.  You cannot drive while taking narcotics.  No lifting or carrying greater than 10 lbs. until further directed by your surgeon. Avoid periods of inactivity such as sitting longer than an hour when not asleep. This helps prevent blood clots.  You may return to work once you are authorized by your doctor.     WEIGHT BEARING   Weight bearing as tolerated with assist device (walker, cane, etc) as  directed, use it as long as suggested by your surgeon or therapist, typically at least 4-6 weeks.   EXERCISES  Results after joint replacement surgery are often greatly improved when you follow the exercise, range of motion and muscle strengthening exercises prescribed by your doctor. Safety measures are also important to protect the joint from further injury. Any time any of these exercises cause you to have increased pain or swelling, decrease what you are doing until you are comfortable again and then slowly increase them. If you have problems or questions, call your caregiver or physical therapist for advice.   Rehabilitation is important following a joint replacement. After just a few days of immobilization, the muscles of the leg can become weakened and shrink (atrophy).  These exercises are designed to build up the tone and strength of the thigh and leg muscles and to improve motion. Often times heat used for twenty to thirty minutes before working out will loosen up your tissues and help with improving the range of motion but do not use heat for the first two weeks following surgery (sometimes heat can increase post-operative swelling).   These exercises can be done on a training (exercise) mat, on the floor, on a table or on a bed. Use whatever works the best and is most comfortable for you.    Use music or television while you are exercising so that the exercises are a pleasant break in your day. This   will make your life better with the exercises acting as a break in your routine that you can look forward to.   Perform all exercises about fifteen times, three times per day or as directed.  You should exercise both the operative leg and the other leg as well.  Exercises include:   Quad Sets - Tighten up the muscle on the front of the thigh (Quad) and hold for 5-10 seconds.   Straight Leg Raises - With your knee straight (if you were given a brace, keep it on), lift the leg to 60 degrees, hold  for 3 seconds, and slowly lower the leg.  Perform this exercise against resistance later as your leg gets stronger.  Leg Slides: Lying on your back, slowly slide your foot toward your buttocks, bending your knee up off the floor (only go as far as is comfortable). Then slowly slide your foot back down until your leg is flat on the floor again.  Angel Wings: Lying on your back spread your legs to the side as far apart as you can without causing discomfort.  Hamstring Strength:  Lying on your back, push your heel against the floor with your leg straight by tightening up the muscles of your buttocks.  Repeat, but this time bend your knee to a comfortable angle, and push your heel against the floor.  You may put a pillow under the heel to make it more comfortable if necessary.   A rehabilitation program following joint replacement surgery can speed recovery and prevent re-injury in the future due to weakened muscles. Contact your doctor or a physical therapist for more information on knee rehabilitation.    CONSTIPATION  Constipation is defined medically as fewer than three stools per week and severe constipation as less than one stool per week.  Even if you have a regular bowel pattern at home, your normal regimen is likely to be disrupted due to multiple reasons following surgery.  Combination of anesthesia, postoperative narcotics, change in appetite and fluid intake all can affect your bowels.   YOU MUST use at least one of the following options; they are listed in order of increasing strength to get the job done.  They are all available over the counter, and you may need to use some, POSSIBLY even all of these options:    Drink plenty of fluids (prune juice may be helpful) and high fiber foods Colace 100 mg by mouth twice a day  Senokot for constipation as directed and as needed Dulcolax (bisacodyl), take with full glass of water  Miralax (polyethylene glycol) once or twice a day as needed.  If  you have tried all these things and are unable to have a bowel movement in the first 3-4 days after surgery call either your surgeon or your primary doctor.    If you experience loose stools or diarrhea, hold the medications until you stool forms back up.  If your symptoms do not get better within 1 week or if they get worse, check with your doctor.  If you experience "the worst abdominal pain ever" or develop nausea or vomiting, please contact the office immediately for further recommendations for treatment.   ITCHING:  If you experience itching with your medications, try taking only a single pain pill, or even half a pain pill at a time.  You can also use Benadryl over the counter for itching or also to help with sleep.   TED HOSE STOCKINGS:  Use stockings on both legs until   for at least 2 weeks or as directed by physician office. They may be removed at night for sleeping.  MEDICATIONS:  See your medication summary on the "After Visit Summary" that nursing will review with you.  You may have some home medications which will be placed on hold until you complete the course of blood thinner medication.  It is important for you to complete the blood thinner medication as prescribed.  PRECAUTIONS:  If you experience chest pain or shortness of breath - call 911 immediately for transfer to the hospital emergency department.   If you develop a fever greater that 101 F, purulent drainage from wound, increased redness or drainage from wound, foul odor from the wound/dressing, or calf pain - CONTACT YOUR SURGEON.                                                   FOLLOW-UP APPOINTMENTS:  If you do not already have a post-op appointment, please call the office for an appointment to be seen by your surgeon.  Guidelines for how soon to be seen are listed in your "After Visit Summary", but are typically between 1-4 weeks after surgery.  OTHER INSTRUCTIONS:   Knee Replacement:  Do not place pillow under knee,  focus on keeping the knee straight while resting. CPM instructions: 0-90 degrees, 2 hours in the morning, 2 hours in the afternoon, and 2 hours in the evening. Place foam block, curve side up under heel at all times except when in CPM or when walking.  DO NOT modify, tear, cut, or change the foam block in any way.  POST-OPERATIVE OPIOID TAPER INSTRUCTIONS: It is important to wean off of your opioid medication as soon as possible. If you do not need pain medication after your surgery it is ok to stop day one. Opioids include: Codeine, Hydrocodone(Norco, Vicodin), Oxycodone(Percocet, oxycontin) and hydromorphone amongst others.  Long term and even short term use of opiods can cause: Increased pain response Dependence Constipation Depression Respiratory depression And more.  Withdrawal symptoms can include Flu like symptoms Nausea, vomiting And more Techniques to manage these symptoms Hydrate well Eat regular healthy meals Stay active Use relaxation techniques(deep breathing, meditating, yoga) Do Not substitute Alcohol to help with tapering If you have been on opioids for less than two weeks and do not have pain than it is ok to stop all together.  Plan to wean off of opioids This plan should start within one week post op of your joint replacement. Maintain the same interval or time between taking each dose and first decrease the dose.  Cut the total daily intake of opioids by one tablet each day Next start to increase the time between doses. The last dose that should be eliminated is the evening dose.   MAKE SURE YOU:  Understand these instructions.  Get help right away if you are not doing well or get worse.    Thank you for letting us be a part of your medical care team.  It is a privilege we respect greatly.  We hope these instructions will help you stay on track for a fast and full recovery!      
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
## 2022-04-02 ENCOUNTER — Encounter (HOSPITAL_COMMUNITY): Payer: Self-pay | Admitting: Orthopedic Surgery

## 2022-04-03 NOTE — Anesthesia Postprocedure Evaluation (Signed)
Anesthesia Post Note  Patient: ANASTASIO WOGAN  Procedure(s) Performed: LEFT HIP REMOVE SCREWS AND RETAINED FEMORAL NAIL (Left)     Patient location during evaluation: PACU Anesthesia Type: General Level of consciousness: awake and alert Pain management: pain level controlled Vital Signs Assessment: post-procedure vital signs reviewed and stable Respiratory status: spontaneous breathing, nonlabored ventilation, respiratory function stable and patient connected to nasal cannula oxygen Cardiovascular status: blood pressure returned to baseline and stable Postop Assessment: no apparent nausea or vomiting Anesthetic complications: no   No notable events documented.  Last Vitals:  Vitals:   04/01/22 1215 04/01/22 1256  BP: 124/79 124/86  Pulse: 77 88  Resp:  18  Temp:    SpO2: 92% 92%    Last Pain:  Vitals:   04/01/22 1256  TempSrc:   PainSc: 3                  Tiajuana Amass

## 2022-04-08 ENCOUNTER — Telehealth: Payer: Self-pay

## 2022-04-08 DIAGNOSIS — Z0000 Encounter for general adult medical examination without abnormal findings: Secondary | ICD-10-CM

## 2022-04-08 NOTE — Telephone Encounter (Signed)
Patient returned call stating that he tried to return device previously after completing the program and was told that it was his to keep. Patient stated that if it is an issue he will return the device. Patient asked when his next appointment was scheduled with Dr. Oval Linsey. Informed patient that his next appointment is on 8/8 at 4:20pm and that I would update Rip [place]bour and if the device needed to be returned, someone would contact him. Patient expressed verbal understanding.   Kip Cropp Truman Hayward, [facility] Guide, Health Coach [address] [place] [address] Telephone: [phone] Email: [email or web address]

## 2022-04-08 NOTE — Telephone Encounter (Signed)
Called to determine if patient had returned the Vivify cuff upon completion of program or if he still had access to device and need to return it to Dr. Oval Linsey. Left message for patient to return call to provide status update on cuff.   Fallyn Munnerlyn Truman Hayward, [facility] Guide, Health Coach [address], [address] [place] [address] Telephone: [phone] Email: [email or web address]

## 2022-04-09 ENCOUNTER — Other Ambulatory Visit: Payer: Self-pay | Admitting: Cardiovascular Disease

## 2022-04-09 DIAGNOSIS — I10 Essential (primary) hypertension: Secondary | ICD-10-CM

## 2022-04-09 NOTE — Telephone Encounter (Signed)
Rx(s) sent to pharmacy electronically.  

## 2022-04-23 ENCOUNTER — Ambulatory Visit (HOSPITAL_BASED_OUTPATIENT_CLINIC_OR_DEPARTMENT_OTHER): Payer: Medicaid Other | Admitting: Cardiovascular Disease

## 2022-04-23 ENCOUNTER — Encounter (HOSPITAL_BASED_OUTPATIENT_CLINIC_OR_DEPARTMENT_OTHER): Payer: Self-pay | Admitting: Cardiovascular Disease

## 2022-04-23 ENCOUNTER — Other Ambulatory Visit (HOSPITAL_BASED_OUTPATIENT_CLINIC_OR_DEPARTMENT_OTHER): Payer: Self-pay

## 2022-04-23 DIAGNOSIS — I10 Essential (primary) hypertension: Secondary | ICD-10-CM | POA: Diagnosis not present

## 2022-04-23 DIAGNOSIS — F17200 Nicotine dependence, unspecified, uncomplicated: Secondary | ICD-10-CM | POA: Diagnosis not present

## 2022-04-23 MED ORDER — BUPROPION HCL ER (SR) 150 MG PO TB12
150.0000 mg | ORAL_TABLET | Freq: Two times a day (BID) | ORAL | 3 refills | Status: DC
Start: 2022-04-23 — End: 2022-07-24
  Filled 2022-04-23: qty 60, 30d supply, fill #0

## 2022-04-23 MED ORDER — OZEMPIC (2 MG/DOSE) 8 MG/3ML ~~LOC~~ SOPN
2.0000 mg | PEN_INJECTOR | SUBCUTANEOUS | 4 refills | Status: DC
  Filled 2022-04-23: qty 3, 28d supply, fill #0
  Filled 2022-05-21: qty 3, 28d supply, fill #1
  Filled 2022-06-14: qty 3, 28d supply, fill #2
  Filled 2022-07-18: qty 3, 28d supply, fill #3
  Filled 2022-08-16: qty 3, 28d supply, fill #4

## 2022-04-23 MED ORDER — BLOOD PRESSURE KIT: PACK | 0 refills | Status: AC

## 2022-04-23 NOTE — Assessment & Plan Note (Signed)
He continues to smoke.  He is really working to try and quit prior to his next surgery.  We will start him on Wellbutrin 150 mg daily for a week followed by 150 mg twice daily for 3 months.

## 2022-04-23 NOTE — Assessment & Plan Note (Signed)
He was doing a great job with weight loss on Ozempic.  He was unable to get it refilled and has not been on it for the last 3 weeks.  He notes that he is increased his appetite significantly since being off the medicine.  We checked with our pharmacy and they do have it in stock downstairs.  He will get 2 mg syringes filled today.  Continue working on diet.  Exercise is limited after his hip surgery.
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
## 2022-04-23 NOTE — Patient Instructions (Addendum)
Medication Instructions:  START BUPROPION 150 MG DAILY FOR 3 DAYS, THEN INCREASE TO TWICE A DAY   *If you need a refill on your cardiac medications before your next appointment, please call your pharmacy*  Lab Work: NONE   Testing/Procedures: NONE  Follow-Up: At [facility], you and your health needs are our priority.  As part of our continuing mission to provide you with exceptional heart care, we have created designated Provider Care Teams.  These Care Teams include your primary Cardiologist (physician) and Advanced Practice Providers (APPs -  Physician Assistants and Nurse Practitioners) who all work together to provide you with the care you need, when you need it.  We recommend signing up for the patient portal called "MyChart".  Sign up information is provided on this After Visit Summary.  MyChart is used to connect with patients for Virtual Visits (Telemedicine).  Patients are able to view lab/test results, encounter notes, upcoming appointments, etc.  Non-urgent messages can be sent to your provider as well.   To learn more about what you can do with MyChart, go to [email or web address].    Your next appointment:   07/24/2022 AT 11:20 AM WITH DR [place]   BRING YOU WELCH ALLYN BLOOD PRESSURE MACHINE BACK TO OFFICE WHEN YOU CAN

## 2022-04-23 NOTE — Assessment & Plan Note (Signed)
Blood pressures have been much better controlled.  They were running low as he lost 30 pounds since starting Ozempic.  His chlorthalidone and carvedilol had both been discontinued.  He has been taking lisinopril every day and only take spironolactone if his blood pressure is elevated.  Okay to continue this regimen.  His blood pressure was initially elevated but better on repeat.

## 2022-05-21 ENCOUNTER — Other Ambulatory Visit (HOSPITAL_BASED_OUTPATIENT_CLINIC_OR_DEPARTMENT_OTHER): Payer: Self-pay

## 2022-05-22 NOTE — Telephone Encounter (Signed)
Patient returned Vivify device

## 2022-06-07 ENCOUNTER — Telehealth (HOSPITAL_BASED_OUTPATIENT_CLINIC_OR_DEPARTMENT_OTHER): Payer: Self-pay

## 2022-06-07 NOTE — Telephone Encounter (Signed)
   Pre-operative Risk Assessment    Patient Name: Miguel Brady  DOB: 25-Aug-1961 MRN: [id number]      Request for Surgical Clearance    Procedure:   Left Anterior Hip Arthroplasty  Date of Surgery:  Clearance TBD                                 Surgeon:  Kathalene Frames. Mayer Camel, MD Surgeon's Group or Practice Name:  [facility] Phone number:  [phone] Fax number:  [phone]   Type of Clearance Requested:   - Medical  --Need to Hold ASA?   Type of Anesthesia:  Spinal   Additional requests/questions:  Please fax a copy of medical notes to the surgeon's office.  Barbaraann Faster   06/07/2022, 9:34 AM

## 2022-06-10 ENCOUNTER — Telehealth: Payer: Self-pay | Admitting: *Deleted

## 2022-06-10 NOTE — Telephone Encounter (Signed)
Pt agreeable to plan of care for tele pre op apt 06/14/22 @ 10:20. Med rec and consent are done.      Pt states he needs a refill on Ozempic. I assured the pt that I will send a note to Dr. Oval Linsey nurse for a refill.

## 2022-06-10 NOTE — Telephone Encounter (Signed)
Pt agreeable to plan of care for tele pre op apt 06/14/22 @ 10:20. Med rec and consent are done.    Pt states he needs a refill on Ozempic. I assured the pt that I will send a note to Dr. Oval Linsey nurse for a refill.      Patient Consent for Virtual Visit        Miguel Brady has provided verbal consent on 06/10/2022 for a virtual visit (video or telephone).   CONSENT FOR VIRTUAL VISIT FOR:  Miguel Brady  By participating in this virtual visit I agree to the following:  I hereby voluntarily request, consent and authorize [facility] and its employed or contracted physicians, physician assistants, nurse practitioners or other licensed health care professionals (the Practitioner), to provide me with telemedicine health care services (the "Services") as deemed necessary by the treating Practitioner. I acknowledge and consent to receive the Services by the Practitioner via telemedicine. I understand that the telemedicine visit will involve communicating with the Practitioner through live audiovisual communication technology and the disclosure of certain medical information by electronic transmission. I acknowledge that I have been given the opportunity to request an in-person assessment or other available alternative prior to the telemedicine visit and am voluntarily participating in the telemedicine visit.  I understand that I have the right to withhold or withdraw my consent to the use of telemedicine in the course of my care at any time, without affecting my right to future care or treatment, and that the Practitioner or I may terminate the telemedicine visit at any time. I understand that I have the right to inspect all information obtained and/or recorded in the course of the telemedicine visit and may receive copies of available information for a reasonable fee.  I understand that some of the potential risks of receiving the Services via telemedicine include:  Delay or  interruption in medical evaluation due to technological equipment failure or disruption; Information transmitted may not be sufficient (e.g. poor resolution of images) to allow for appropriate medical decision making by the Practitioner; and/or  In rare instances, security protocols could fail, causing a breach of personal health information.  Furthermore, I acknowledge that it is my responsibility to provide information about my medical history, conditions and care that is complete and accurate to the best of my ability. I acknowledge that Practitioner's advice, recommendations, and/or decision may be based on factors not within their control, such as incomplete or inaccurate data provided by me or distortions of diagnostic images or specimens that may result from electronic transmissions. I understand that the practice of medicine is not an exact science and that Practitioner makes no warranties or guarantees regarding treatment outcomes. I acknowledge that a copy of this consent can be made available to me via my patient portal ([facility]), or I can request a printed copy by calling the office of [facility].    I understand that my insurance will be billed for this visit.   I have read or had this consent read to me. I understand the contents of this consent, which adequately explains the benefits and risks of the Services being provided via telemedicine.  I have been provided ample opportunity to ask questions regarding this consent and the Services and have had my questions answered to my satisfaction. I give my informed consent for the services to be provided through the use of telemedicine in my medical care

## 2022-06-10 NOTE — Telephone Encounter (Signed)
   Name: Miguel Brady  DOB: [birth year]  MRN: [id number]  Primary Cardiologist: Skeet Latch, MD   Preoperative team, please contact this patient and set up a phone call appointment for further preoperative risk assessment. Please obtain consent and complete medication review. Thank you for your help.  I confirm that guidance regarding antiplatelet and oral anticoagulation therapy has been completed and, if necessary, noted below.  Requesting party needs to clarify whether or not aspirin needs to be held prior to surgery. If aspirin needs to be held and patient has known new symptoms or concerns at the time of his virtual visit, he may hold aspirin for 5-7 days prior to procedure.    Lenna Sciara, NP 06/10/2022, 11:33 AM [place]

## 2022-06-14 ENCOUNTER — Ambulatory Visit: Payer: Medicaid Other | Attending: Cardiovascular Disease | Admitting: Nurse Practitioner

## 2022-06-14 ENCOUNTER — Other Ambulatory Visit (HOSPITAL_BASED_OUTPATIENT_CLINIC_OR_DEPARTMENT_OTHER): Payer: Self-pay

## 2022-06-14 DIAGNOSIS — Z01810 Encounter for preprocedural cardiovascular examination: Secondary | ICD-10-CM

## 2022-06-14 NOTE — Progress Notes (Signed)
Virtual Visit via Telephone Note   Because of Miguel Brady's co-morbid illnesses, he is at least at moderate risk for complications without adequate follow up.  This format is felt to be most appropriate for this patient at this time.  The patient did not have access to video technology/had technical difficulties with video requiring transitioning to audio format only (telephone).  All issues noted in this document were discussed and addressed.  No physical exam could be performed with this format.  Please refer to the patient's chart for his consent to telehealth for [facility].  Evaluation Performed:  Preoperative cardiovascular risk assessment _____________   Date:  06/14/2022   Patient ID:  Miguel Brady, DOB [birth year], MRN [id number] Patient Location:  Home Provider location:   Office  Primary Care Provider:  Lujean Amel, MD Primary Cardiologist:  Miguel Latch, MD  Chief Complaint / Patient Profile   61 y.o. y/o male with a h/o hypertension, hyperlipidemia, type 2 diabetes, GERD, obesity, and tobacco use who is pending left anterior hip arthroplasty wht Miguel. Frederik Brady of [place] [facility] and presents today for telephonic preoperative cardiovascular risk assessment.  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  no inhaler   Substance abuse ([place])    ETOH   TOBACCO ABUSE 12/23/2008   TRIGGER FINGER 05/05/2007   Tuberculosis    pos  PPD   Past Surgical History:  Procedure Laterality Date   CHEST TUBE INSERTION  04/03/2012   Procedure: CHEST TUBE INSERTION;  Surgeon: Miguel Jarred, MD;  Location: [place];  Service: General;  Laterality: Left;   CHOLECYSTECTOMY  07/28/2012   Procedure: LAPAROSCOPIC CHOLECYSTECTOMY;  Surgeon: Miguel Bowie, MD;  Location: [facility];  Service: General;  Laterality: N/A;   EXTERNAL FIXATION LEG  04/03/2012   Procedure: EXTERNAL FIXATION LEG;  Surgeon: Miguel Box, MD;  Location: [place];  Service: Orthopedics;  Laterality: Left;  Left femur   EXTERNAL FIXATION PELVIS  04/03/2012   Procedure: EXTERNAL FIXATION PELVIS;  Surgeon: Miguel Box, MD;  Location: [place];  Service: Orthopedics;;   FEMUR IM NAIL  04/07/2012   Procedure: INTRAMEDULLARY (IM) NAIL FEMORAL;  Surgeon: Miguel Box, MD;  Location: [place];  Service: Orthopedics;  Laterality: Left;   FLEXIBLE BRONCHOSCOPY  04/07/2012   Procedure: FLEXIBLE BRONCHOSCOPY;  Surgeon: Miguel Jarred, MD;  Location: [place];  Service: General;;  START TIME=1645 END TIME=1700   HARDWARE REMOVAL Left 04/01/2022   Procedure: LEFT HIP REMOVE SCREWS AND RETAINED FEMORAL NAIL;  Surgeon: Miguel Pear, MD;  Location: [facility];  Service: Orthopedics;  Laterality: Left;   INCISION AND DRAINAGE OF WOUND  04/03/2012   Procedure: IRRIGATION AND DEBRIDEMENT WOUND;  Surgeon: Miguel Connors, MD;  Location: [place];  Service: Neurosurgery;  Laterality: N/A;  Frontal.   ORIF PATELLA  04/07/2012   Procedure: OPEN REDUCTION INTERNAL (ORIF) FIXATION  PATELLA;  Surgeon: Miguel Box, MD;  Location: [place];  Service: Orthopedics;  Laterality: Left;   ORIF PELVIC FRACTURE  04/07/2012   Procedure: OPEN REDUCTION INTERNAL FIXATION (ORIF) PELVIC FRACTURE;  Surgeon: Miguel Box, MD;  Location: [place];  Service: Orthopedics;  Laterality: N/A;  Right and left sacroiliac screw pinning,Irrigation and debridebridement open tibia and femur,removal external fixator.   TIBIA IM NAIL  INSERTION  04/07/2012   Procedure: INTRAMEDULLARY (IM) NAIL TIBIAL;  Surgeon: Miguel Box, MD;  Location: [place];  Service: Orthopedics;  Laterality: Left;    Allergies  Allergies  Allergen Reactions   Shellfish Allergy Anaphylaxis   Iodine Rash   Penicillins Itching    Has patient had a PCN reaction causing immediate rash, facial/tongue/throat swelling, SOB or lightheadedness with hypotension: No Has patient had a PCN reaction causing severe rash involving mucus membranes or skin necrosis: No Has patient had a PCN reaction that required hospitalization No Has patient had a PCN reaction occurring within the last 10 years: No If all of the above answers are "NO", then may proceed with Cephalosporin use.    History of Present Illness    Miguel Brady is a 61 y.o. male who presents via audio/video conferencing for a telehealth visit today.  Pt was last seen in cardiology clinic on 04/23/2022 by Miguel Brady.  At that time Miguel Brady was doing well.  The patient is now pending procedure as outlined above. Since his last visit, he has done well from a cardiac standpoint. He denies chest pain, palpitations, dyspnea, pnd, orthopnea, n, v, dizziness, syncope, edema, weight gain, or early satiety. All other systems reviewed and are otherwise negative except as noted above.   Home Medications    Prior to Admission medications   Medication Sig Start Date End Date Taking? Authorizing Provider  albuterol (VENTOLIN HFA) 108 (90 Base) MCG/ACT inhaler Inhale 1-2 puffs into the lungs every 6 (six) hours as needed for wheezing or shortness of breath.    [provider]  aspirin EC 81 MG tablet Take 1 tablet (81 mg total) by mouth 2 (two) times daily. 04/01/22   Miguel Brady K, PA-C  B-D 3CC LUER-LOK SYR 18GX1-1/2 18G X 1-1/2" 3 ML MISC Inject 1 Syringe into the skin See admin instructions. 11/21/20   [provider]  B-D 3CC LUER-LOK SYR 22GX1" 22G X 1" 3 ML MISC See admin  instructions. 02/28/20   [provider]  Blood Pressure KIT MONITOR BLOOD PRESSURE DAILY DX I10. 04/23/22   Miguel Latch, MD  buPROPion [facility] SR) 150 MG 12 hr tablet Take 1 tablet (150 mg total) by mouth 2 (two) times daily. 04/23/22   Miguel Latch, MD  cholecalciferol (VITAMIN D) 1000 UNITS tablet Take 1,000 Units by mouth at bedtime.     [provider]  diclofenac Sodium (VOLTAREN) 1 % GEL Apply 2 g topically daily as needed (pain).    [provider]  fluticasone (FLONASE) 50 MCG/ACT nasal spray Place 2 sprays into both nostrils daily as needed for allergies. 01/12/21   [provider]  gabapentin (NEURONTIN) 300 MG capsule Take 900 mg by mouth 3 (three) times daily. 02/07/20   [provider]  lisinopril (ZESTRIL) 10 MG tablet Take 10 mg by mouth daily.    [provider]  metFORMIN (GLUCOPHAGE) 1000 MG tablet Take 1,000 mg by mouth 2 (two) times daily with a meal. 07/07/15   [provider]  methocarbamol (ROBAXIN-750) 750  MG tablet Take 1 tablet (750 mg total) by mouth 4 (four) times daily. 04/01/22   Leighton Parody, PA-C  naloxone [facility]) nasal spray 4 mg/0.1 mL Place 1 spray into the nose daily as needed. Patient not taking: Reported on 04/23/2022 01/08/21   [provider]  oxyCODONE-acetaminophen (PERCOCET) 10-325 MG tablet Take 1 tablet by mouth every 4 (four) hours as needed for pain. 04/01/22   Leighton Parody, PA-C  pantoprazole (PROTONIX) 40 MG tablet Take 40 mg by mouth daily.    [provider]  potassium chloride (KLOR-CON M) 10 MEQ tablet Take 1 tablet by mouth at bedtime. 04/02/22   [provider]  pravastatin (PRAVACHOL) 20 MG tablet Take 20 mg by mouth at bedtime. 09/15/15   [provider]  Semaglutide, 2 MG/DOSE, (OZEMPIC, 2 MG/DOSE,) 8 MG/3ML SOPN Inject 2 mg into the skin once a week. 04/23/22   Miguel Latch, MD  sildenafil (VIAGRA) 100 MG tablet Take 100 mg by  mouth daily as needed for erectile dysfunction.    [provider]  spironolactone (ALDACTONE) 25 MG tablet TAKE 1 TABLET(25 MG) BY MOUTH DAILY Patient not taking: Reported on 04/23/2022 04/09/22   Miguel Latch, MD  testosterone cypionate (DEPOTESTOSTERONE CYPIONATE) 200 MG/ML injection Inject 1 mL into the muscle every 28 (twenty-eight) days. 09/21/15   [provider]    Physical Exam    Vital Signs:  WISDOM RICKEY does not have vital signs available for review today.  Given telephonic nature of communication, physical exam is limited. AAOx3. NAD. Normal affect.  Speech and respirations are unlabored.  Accessory Clinical Findings    None  Assessment & Plan    1.  Preoperative Cardiovascular Risk Assessment:  According to the Revised Cardiac Risk Index (RCRI), his Perioperative Risk of Major Cardiac Event is (%): 0.4. His Functional Capacity in METs is: 5.38 according to the [place] Activity Status Index (DASI).Therefore, based on ACC/AHA guidelines, patient would be at acceptable risk for the planned procedure without further cardiovascular testing.   The patient was advised that if he develops new symptoms prior to surgery to contact our office to arrange for a follow-up visit, and he verbalized understanding.  Of note, patient states he is no longer taking Aspirin.   A copy of this note will be routed to requesting surgeon.  Time:   Today, I have spent 6 minutes with the patient with telehealth technology discussing medical history, symptoms, and management plan.     Lenna Sciara, NP  06/14/2022, 10:34 AM

## 2022-06-17 ENCOUNTER — Other Ambulatory Visit (HOSPITAL_BASED_OUTPATIENT_CLINIC_OR_DEPARTMENT_OTHER): Payer: Self-pay

## 2022-07-05 ENCOUNTER — Other Ambulatory Visit: Payer: Self-pay | Admitting: Orthopedic Surgery

## 2022-07-12 NOTE — Patient Instructions (Signed)
SURGICAL WAITING ROOM VISITATION Patients having surgery or a procedure may have no more than 2 support people in the waiting area - these visitors may rotate.   Children under the age of 10 must have an adult with them who is not the patient. If the patient needs to stay at the hospital during part of their recovery, the visitor guidelines for inpatient rooms apply. Pre-op nurse will coordinate an appropriate time for 1 support person to accompany patient in pre-op.  This support person may not rotate.    Please refer to the [facility] website for the visitor guidelines for Inpatients (after your surgery is over and you are in a regular room).    Your procedure is scheduled on: 07/29/22   Report to [facility] Main Entr[facility]    Report to admitting at 9:50 AM   Call this number if you have problems the morning of surgery [phone]   Do not eat food :After Midnight.   After Midnight you may have the following liquids until 9:20 AM DAY OF SURGERY  Water Non-Citrus Juices (without pulp, NO RED) Carbonated Beverages Black Coffee (NO MILK/CREAM OR CREAMERS, sugar ok)  Clear Tea (NO MILK/CREAM OR CREAMERS, sugar ok) regular and decaf                             Plain Jell-O (NO RED)                                           Fruit ices (not with fruit pulp, NO RED)                                     Popsicles (NO RED)                                                               Sports drinks like Gatorade (NO RED)    The day of surgery:  Drink ONE (1) Pre-Surgery G2 at 9:20 AM the morning of surgery. Drink in one sitting. Do not sip.  This drink was given to you during your hospital  pre-op appointment visit. Nothing else to drink after completing the  Pre-Surgery G2.          If you have questions, please contact your surgeon's office.   FOLLOW BOWEL PREP AND ANY ADDITIONAL PRE OP INSTRUCTIONS YOU RECEIVED FROM YOUR SURGEON'S OFFICE!!!     Oral Hygiene is also  important to reduce your risk of infection.                                    Remember - BRUSH YOUR TEETH THE MORNING OF SURGERY WITH YOUR REGULAR TOOTHPASTE   Take these medicines the morning of surgery with A SIP OF WATER: Inhalers, Gabapentin, Oxycodone, Pantoprazole   DO NOT TAKE ANY ORAL DIABETIC MEDICATIONS DAY OF YOUR SURGERY  How to Manage Your Diabetes Before and After Surgery  Why is it important to control my  blood sugar before and after surgery? Improving blood sugar levels before and after surgery helps healing and can limit problems. A way of improving blood sugar control is eating a healthy diet by:  Eating less sugar and carbohydrates  Increasing activity/exercise  Talking with your doctor about reaching your blood sugar goals High blood sugars (greater than 180 mg/dL) can raise your risk of infections and slow your recovery, so you will need to focus on controlling your diabetes during the weeks before surgery. Make sure that the doctor who takes care of your diabetes knows about your planned surgery including the date and location.  How do I manage my blood sugar before surgery? Check your blood sugar at least 4 times a day, starting 2 days before surgery, to make sure that the level is not too high or low. Check your blood sugar the morning of your surgery when you wake up and every 2 hours until you get to the Short Stay unit. If your blood sugar is less than 70 mg/dL, you will need to treat for low blood sugar: Do not take insulin. Treat a low blood sugar (less than 70 mg/dL) with  cup of clear juice (cranberry or apple), 4 glucose tablets, OR glucose gel. Recheck blood sugar in 15 minutes after treatment (to make sure it is greater than 70 mg/dL). If your blood sugar is not greater than 70 mg/dL on recheck, call [phone] for further instructions. Report your blood sugar to the short stay nurse when you get to Short Stay.  If you are admitted to the hospital  after surgery: Your blood sugar will be checked by the staff and you will probably be given insulin after surgery (instead of oral diabetes medicines) to make sure you have good blood sugar levels. The goal for blood sugar control after surgery is 80-180 mg/dL.   WHAT DO I DO ABOUT MY DIABETES MEDICATION?  Do not take oral diabetes medicines (pills) the morning of surgery.  Do not take Ozempic 07/25/22.  THE DAY BEFORE SURGERY, take Metformin as prescribed.      THE MORNING OF SURGERY, do not take Metformin.  DO NOT TAKE THE FOLLOWING 7 DAYS PRIOR TO SURGERY: Ozempic, Wegovy, Rybelsus (Semaglutide), Byetta (exenatide), Bydureon (exenatide ER), Victoza, Saxenda (liraglutide), or Trulicity (dulaglutide) Mounjaro (Tirzepatide) Adlyxin (Lixisenatide), Polyethylene Glycol Loxenatide.  Reviewed and Endorsed by [facility] Patient Education Committee, August 2015                               You may not have any metal on your body including jewelry, and body piercing             Do not wear lotions, powders, cologne, or deodorant              Men may shave face and neck.   Do not bring valuables to the hospital. [place].   Bring small overnight bag day of surgery.   DO NOT [place]. PHARMACY WILL DISPENSE MEDICATIONS LISTED ON YOUR MEDICATION LIST TO YOU DURING YOUR ADMISSION [place]!              Please read over the following fact sheets you were given: IF YOU HAVE [place] [phone] Schneiders   If  you received a COVID test during your pre-op visit  it is requested that you wear a mask when out in public, stay away from anyone that may not be feeling well and notify your surgeon if you develop symptoms. If you test positive for Covid or have been in contact with anyone that has tested positive in the last 10 days please notify you surgeon.      [place] - Preparing for Surgery Before surgery, you can play an important role.  Because skin is not sterile, your skin needs to be as free of germs as possible.  You can reduce the number of germs on your skin by washing with CHG (chlorahexidine gluconate) soap before surgery.  CHG is an antiseptic cleaner which kills germs and bonds with the skin to continue killing germs even after washing. Please DO NOT use if you have an allergy to CHG or antibacterial soaps.  If your skin becomes reddened/irritated stop using the CHG and inform your nurse when you arrive at Short Stay. Do not shave (including legs and underarms) for at least 48 hours prior to the first CHG shower.  You may shave your face/neck.  Please follow these instructions carefully:  1.  Shower with CHG Soap the night before surgery and the  morning of surgery.  2.  If you choose to wash your hair, wash your hair first as usual with your normal  shampoo.  3.  After you shampoo, rinse your hair and body thoroughly to remove the shampoo.                             4.  Use CHG as you would any other liquid soap.  You can apply chg directly to the skin and wash.  Gently with a scrungie or clean washcloth.  5.  Apply the CHG Soap to your body ONLY FROM THE NECK DOWN.   Do   not use on face/ open                           Wound or open sores. Avoid contact with eyes, ears mouth and   genitals (private parts).                       Wash face,  Genitals (private parts) with your normal soap.             6.  Wash thoroughly, paying special attention to the area where your    surgery  will be performed.  7.  Thoroughly rinse your body with warm water from the neck down.  8.  DO NOT shower/wash with your normal soap after using and rinsing off the CHG Soap.                9.  Pat yourself dry with a clean towel.            10.  Wear clean pajamas.            11.  Place clean sheets on your bed the night of your first shower and do not  sleep  with pets. Day of Surgery : Do not apply any lotions/deodorants the morning of surgery.  Please wear clean clothes to the hospital/surgery center.  FAILURE TO FOLLOW THESE INSTRUCTIONS MAY RESULT IN THE CANCELLATION OF YOUR SURGERY  PATIENT SIGNATURE_________________________________  NURSE SIGNATURE__________________________________  ________________________________________________________________________  Incentive Spirometer  An incentive spirometer is a tool that can help keep your lungs clear and active. This tool measures how well you are filling your lungs with each breath. Taking long deep breaths may help reverse or decrease the chance of developing breathing (pulmonary) problems (especially infection) following: A long period of time when you are unable to move or be active. BEFORE THE PROCEDURE  If the spirometer includes an indicator to show your best effort, your nurse or respiratory therapist will set it to a desired goal. If possible, sit up straight or lean slightly forward. Try not to slouch. Hold the incentive spirometer in an upright position. INSTRUCTIONS FOR USE  Sit on the edge of your bed if possible, or sit up as far as you can in bed or on a chair. Hold the incentive spirometer in an upright position. Breathe out normally. Place the mouthpiece in your mouth and seal your lips tightly around it. Breathe in slowly and as deeply as possible, raising the piston or the ball toward the top of the column. Hold your breath for 3-5 seconds or for as long as possible. Allow the piston or ball to fall to the bottom of the column. Remove the mouthpiece from your mouth and breathe out normally. Rest for a few seconds and repeat Steps 1 through 7 at least 10 times every 1-2 hours when you are awake. Take your time and take a few normal breaths between deep breaths. The spirometer may include an indicator to show your best effort. Use the indicator as a goal to work toward  during each repetition. After each set of 10 deep breaths, practice coughing to be sure your lungs are clear. If you have an incision (the cut made at the time of surgery), support your incision when coughing by placing a pillow or rolled up towels firmly against it. Once you are able to get out of bed, walk around indoors and cough well. You may stop using the incentive spirometer when instructed by your caregiver.  RISKS AND COMPLICATIONS Take your time so you do not get dizzy or light-headed. If you are in pain, you may need to take or ask for pain medication before doing incentive spirometry. It is harder to take a deep breath if you are having pain. AFTER USE Rest and breathe slowly and easily. It can be helpful to keep track of a log of your progress. Your caregiver can provide you with a simple table to help with this. If you are using the spirometer at home, follow these instructions: [place] IF:  You are having difficultly using the spirometer. You have trouble using the spirometer as often as instructed. Your pain medication is not giving enough relief while using the spirometer. You develop fever of 100.5 F (38.1 C) or higher. SEEK IMMEDIATE MEDICAL CARE IF:  You cough up bloody sputum that had not been present before. You develop fever of 102 F (38.9 C) or greater. You develop worsening pain at or near the incision site. MAKE SURE YOU:  Understand these instructions. Will watch your condition. Will get help right away if you are not doing well or get worse. Document Released: 01/13/2007 Document Revised: 11/25/2011 Document Reviewed: 03/16/2007 Exit[employer] Patient Information 2014 [employer], [place].   ________________________________________________________________________  WHAT IS A BLOOD TRANSFUSION? Blood Transfusion Information  A transfusion is the replacement of blood or some of its parts. Blood is made up of multiple cells which provide different  functions. Red blood  cells carry oxygen and are used for blood loss replacement. White blood cells fight against infection. Platelets control bleeding. Plasma helps clot blood. Other blood products are available for specialized needs, such as hemophilia or other clotting disorders. BEFORE THE TRANSFUSION  Who gives blood for transfusions?  Healthy volunteers who are fully evaluated to make sure their blood is safe. This is blood bank blood. Transfusion therapy is the safest it has ever been in the practice of medicine. Before blood is taken from a donor, a complete history is taken to make sure that person has no history of diseases nor engages in risky social behavior (examples are intravenous drug use or sexual activity with multiple partners). The donor's travel history is screened to minimize risk of transmitting infections, such as malaria. The donated blood is tested for signs of infectious diseases, such as HIV and hepatitis. The blood is then tested to be sure it is compatible with you in order to minimize the chance of a transfusion reaction. If you or a relative donates blood, this is often done in anticipation of surgery and is not appropriate for emergency situations. It takes many days to process the donated blood. RISKS AND COMPLICATIONS Although transfusion therapy is very safe and saves many lives, the main dangers of transfusion include:  Getting an infectious disease. Developing a transfusion reaction. This is an allergic reaction to something in the blood you were given. Every precaution is taken to prevent this. The decision to have a blood transfusion has been considered carefully by your caregiver before blood is given. Blood is not given unless the benefits outweigh the risks. AFTER THE TRANSFUSION Right after receiving a blood transfusion, you will usually feel much better and more energetic. This is especially true if your red blood cells have gotten low (anemic). The  transfusion raises the level of the red blood cells which carry oxygen, and this usually causes an energy increase. The nurse administering the transfusion will monitor you carefully for complications. HOME CARE INSTRUCTIONS  No special instructions are needed after a transfusion. You may find your energy is better. Speak with your caregiver about any limitations on activity for underlying diseases you may have. SEEK MEDICAL CARE IF:  Your condition is not improving after your transfusion. You develop redness or irritation at the intravenous (IV) site. SEEK IMMEDIATE MEDICAL CARE IF:  Any of the following symptoms occur over the next 12 hours: Shaking chills. You have a temperature by mouth above 102 F (38.9 C), not controlled by medicine. Chest, back, or muscle pain. People around you feel you are not acting correctly or are confused. Shortness of breath or difficulty breathing. Dizziness and fainting. You get a rash or develop hives. You have a decrease in urine output. Your urine turns a dark color or changes to pink, red, or brown. Any of the following symptoms occur over the next 10 days: You have a temperature by mouth above 102 F (38.9 C), not controlled by medicine. Shortness of breath. Weakness after normal activity. The white part of the eye turns yellow (jaundice). You have a decrease in the amount of urine or are urinating less often. Your urine turns a dark color or changes to pink, red, or brown. Document Released: 08/30/2000 Document Revised: 11/25/2011 Document Reviewed: 04/18/2008 [facility] Patient Information 2014 [place], [place].  _______________________________________________________________________

## 2022-07-12 NOTE — Progress Notes (Signed)
COVID Vaccine Completed:  Date of COVID positive in last 90 days:  PCP - Lujean Amel, MD Cardiologist - Skeet Latch, MD  Cardiac clearance by Diona Browner 06/14/22 in Epic  Chest x-ray - CT 01/09/22 Epic EKG - 03/27/22 Epic Stress Test - 11/16/20 Epic ECHO - 07/21/20 Epic Cardiac Cath -  Pacemaker/ICD device last checked: Spinal Cord Stimulator:  Bowel Prep -   Sleep Study -  CPAP -   Fasting Blood Sugar -  Checks Blood Sugar _____ times a day  Ozempic   Blood Thinner Instructions: Aspirin Instructions: ASA 81, on hold Last Dose:  Activity level:  Can go up a flight of stairs and perform activities of daily living without stopping and without symptoms of chest pain or shortness of breath.  Able to exercise without symptoms  Unable to go up a flight of stairs without symptoms of     Anesthesia review:   Patient denies shortness of breath, fever, cough and chest pain at PAT appointment  Patient verbalized understanding of instructions that were given to them at the PAT appointment. Patient was also instructed that they will need to review over the PAT instructions again at home before surgery.

## 2022-07-16 ENCOUNTER — Encounter (HOSPITAL_COMMUNITY)
Admission: RE | Admit: 2022-07-16 | Discharge: 2022-07-16 | Disposition: A | Discharge status: Home / Self Care | Payer: Medicaid Other | Source: Ambulatory Visit | Attending: Orthopedic Surgery | Admitting: Orthopedic Surgery

## 2022-07-16 ENCOUNTER — Encounter (HOSPITAL_COMMUNITY): Payer: Self-pay

## 2022-07-16 VITALS — BP 149/80 | HR 65 | Temp 97.6°F | Resp 17 | Wt 280.0 lb

## 2022-07-16 DIAGNOSIS — E119 Type 2 diabetes mellitus without complications: Secondary | ICD-10-CM | POA: Diagnosis not present

## 2022-07-16 DIAGNOSIS — Z01818 Encounter for other preprocedural examination: Secondary | ICD-10-CM

## 2022-07-16 DIAGNOSIS — Z01812 Encounter for preprocedural laboratory examination: Secondary | ICD-10-CM | POA: Diagnosis present

## 2022-07-16 HISTORY — DX: Unspecified osteoarthritis, unspecified site: M19.90

## 2022-07-16 LAB — BASIC METABOLIC PANEL
Anion gap: 7 (ref 5–15)
BUN: 12 mg/dL (ref 6–20)
CO2: 26 mmol/L (ref 22–32)
Calcium: 9 mg/dL (ref 8.9–10.3)
Chloride: 106 mmol/L (ref 98–111)
Creatinine, Ser: 1.1 mg/dL (ref 0.61–1.24)
GFR, Estimated: >60 mL/min (ref >60)
Glucose, Bld: 81 mg/dL (ref 70–99)
Potassium: 3.8 mmol/L (ref 3.5–5.1)
Sodium: 139 mmol/L (ref 135–145)

## 2022-07-16 LAB — CBC
HCT: 37.2 % — ABNORMAL LOW (ref 39.0–52.0)
Hemoglobin: 11.8 g/dL — ABNORMAL LOW (ref 13.0–17.0)
MCH: 29.9 pg (ref 26.0–34.0)
MCHC: 31.7 g/dL (ref 30.0–36.0)
MCV: 94.4 fL (ref 80.0–100.0)
Platelets: 236 10*3/uL (ref 150–400)
RBC: 3.94 MIL/uL — ABNORMAL LOW (ref 4.22–5.81)
RDW: 14 % (ref 11.5–15.5)
WBC: 6.5 10*3/uL (ref 4.0–10.5)
nRBC: 0 % (ref 0.0–0.2)

## 2022-07-16 LAB — HEMOGLOBIN A1C
Hgb A1c MFr Bld: 5.5 % (ref 4.8–5.6)
Mean Plasma Glucose: 111.15 mg/dL

## 2022-07-16 LAB — TYPE AND SCREEN
ABO/RH(D): A POS
Antibody Screen: NEGATIVE

## 2022-07-16 LAB — GLUCOSE, CAPILLARY: Glucose-Capillary: 73 mg/dL (ref 70–99)

## 2022-07-17 LAB — SURGICAL PCR SCREEN
MRSA, PCR: POSITIVE — AB
Staphylococcus aureus: POSITIVE — AB

## 2022-07-17 NOTE — Progress Notes (Signed)
[facility]+ MRSA+ results routed to Dr. Mayer Camel

## 2022-07-18 ENCOUNTER — Other Ambulatory Visit (HOSPITAL_BASED_OUTPATIENT_CLINIC_OR_DEPARTMENT_OTHER): Payer: Self-pay

## 2022-07-24 ENCOUNTER — Ambulatory Visit (HOSPITAL_BASED_OUTPATIENT_CLINIC_OR_DEPARTMENT_OTHER): Payer: Medicaid Other | Admitting: Cardiovascular Disease

## 2022-07-24 ENCOUNTER — Encounter (HOSPITAL_BASED_OUTPATIENT_CLINIC_OR_DEPARTMENT_OTHER): Payer: Self-pay | Admitting: Cardiovascular Disease

## 2022-07-24 VITALS — BP 126/70 | HR 80 | Ht 72.0 in | Wt 276.2 lb

## 2022-07-24 DIAGNOSIS — Z01810 Encounter for preprocedural cardiovascular examination: Secondary | ICD-10-CM

## 2022-07-24 DIAGNOSIS — E7800 Pure hypercholesterolemia, unspecified: Secondary | ICD-10-CM

## 2022-07-24 DIAGNOSIS — I10 Essential (primary) hypertension: Secondary | ICD-10-CM | POA: Diagnosis not present

## 2022-07-24 HISTORY — DX: Encounter for preprocedural cardiovascular examination: Z01.810

## 2022-07-24 MED ORDER — CHLORTHALIDONE 25 MG PO TABS: 25.0000 mg | ORAL_TABLET | Freq: Every day | ORAL | 2 refills | Status: DC

## 2022-07-24 MED ORDER — LISINOPRIL 20 MG PO TABS: 20.0000 mg | ORAL_TABLET | Freq: Every day | ORAL | 3 refills | Status: DC
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
## 2022-07-24 NOTE — Patient Instructions (Addendum)
Medication Instructions:  INCREASE- Chlorthalidone 25 mg by mouth daily  *If you need a refill on your cardiac medications before your next appointment, please call your pharmacy*   Lab Work: None Ordered  If you have labs (blood work) drawn today and your tests are completely normal, you will receive your results only by: [place] (if you have MyChart) OR A paper copy in the mail If you have any lab test that is abnormal or we need to change your treatment, we will call you to review the results.   Testing/Procedures: None Ordered   Follow-Up: At [facility], you and your health needs are our priority.  As part of our continuing mission to provide you with exceptional heart care, we have created designated Provider Care Teams.  These Care Teams include your primary Cardiologist (physician) and Advanced Practice Providers (APPs -  Physician Assistants and Nurse Practitioners) who all work together to provide you with the care you need, when you need it.  We recommend signing up for the patient portal called "MyChart".  Sign up information is provided on this After Visit Summary.  MyChart is used to connect with patients for Virtual Visits (Telemedicine).  Patients are able to view lab/test results, encounter notes, upcoming appointments, etc.  Non-urgent messages can be sent to your provider as well.   To learn more about what you can do with MyChart, go to [email or web address].    Your next appointment:   1 month(s)  The format for your next appointment:   In Person  Provider:   Laurann Montana, NP    Other Instructions 6 Months with Dr Oval Linsey
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
## 2022-07-24 NOTE — Assessment & Plan Note (Signed)
Mr. Schoene is scheduled for hip surgery.  He previously underwent stress testing last year that was negative.  He has not had any new symptoms or any new heart failure symptoms.  No further testing indicated.  He is acceptable risk to proceed with surgery.

## 2022-07-24 NOTE — Assessment & Plan Note (Signed)
Lipids are well controlled on pravastatin.

## 2022-07-24 NOTE — Assessment & Plan Note (Signed)
Continue Ozempic postoperatively.  He will be able to start PT and hopefully increase his exercise which should also help.

## 2022-07-24 NOTE — Assessment & Plan Note (Signed)
Blood pressure was pretty good today in the office.  At home it has been elevated and almost always above his goal of less than 130/80.  His medication list are unclear.  He reports that he has been taking lisinopril and chlorthalidone.  We will increase the chlorthalidone to 25 mg daily and continue lisinopril at 20 mg a day.  He will be in the hospital next week so I am sure he will have a basic metabolic panel drawn at that time and we do not need to have him come back for 1.  Check his blood pressures at home and bring to follow-up in a month.

## 2022-07-25 NOTE — Progress Notes (Signed)
Anesthesia Chart Review   Case: [id number] Date/Time: 07/29/22 1205   Procedure: LEFT TOTAL HIP ARTHROPLASTY ANTERIOR APPROACH (Left: Hip)   Anesthesia type: Spinal   Pre-op diagnosis: LEFT HIP OSTEOARTHRITIS POST TRAMATIC   Location: [facility] / [facility]   Surgeons: Frederik Pear, MD       DISCUSSION:60 y.o. former smoker with h/o DM II (A1C 5.5), OSA, left hip OA scheduled for above procedure 07/29/2022 with Dr. Frederik Pear.   Pt seen by cardiology 07/24/2022 for preoperative evaluation.  Per OV note, "Mr. Puertas is scheduled for hip surgery.  He previously underwent stress testing last year that was negative.  He has not had any new symptoms or any new heart failure symptoms.  No further testing indicated.  He is acceptable risk to proceed with surgery. "  Anticipate pt can proceed with planned procedure barring acute status change.   VS: BP (!) 149/80 (BP Location: Right Arm)   Pulse 65   Temp 36.4 C (Oral)   Resp 17   Wt 127 kg   SpO2 100%   BMI 39.05 kg/m   PROVIDERS: Koirala, Dibas, MD is PCP   Cardiologist:  Skeet Latch, MD   LABS: Labs reviewed: Acceptable for surgery. (all labs ordered are listed, but only abnormal results are displayed)  Labs Reviewed  SURGICAL PCR SCREEN - Abnormal; Notable for the following components:      Result Value   MRSA, PCR POSITIVE (*)    Staphylococcus aureus POSITIVE (*)    All other components within normal limits  CBC - Abnormal; Notable for the following components:   RBC 3.94 (*)    Hemoglobin 11.8 (*)    HCT 37.2 (*)    All other components within normal limits  HEMOGLOBIN K3T  BASIC METABOLIC PANEL  GLUCOSE, CAPILLARY  TYPE AND SCREEN     IMAGES:   EKG:   CV: Myocardial Perfusion 11/17/2020 The left ventricular ejection fraction is moderately decreased (30-44%). Nuclear stress EF: 42%. There was no ST segment deviation noted during stress. The study is normal. This is a low risk study.   Low risk stress  nuclear study with normal perfusion and reduced left ventricular global systolic function. Recommend corroboration with echocardiography.  Echo 07/21/2020 1. Left ventricular ejection fraction, by estimation, is 55 to 60%. The  left ventricle has normal function. The left ventricle has no regional  wall motion abnormalities. There is moderate concentric left ventricular  hypertrophy. Left ventricular  diastolic parameters are consistent with Grade II diastolic dysfunction  (pseudonormalization). Elevated left atrial pressure. The E/e' is 18.1.   2. Right ventricular systolic function is normal. The right ventricular  size is normal.   3. The mitral valve is normal in structure. Trivial mitral valve  regurgitation.   4. The aortic valve is tricuspid. Aortic valve regurgitation is not  visualized.   5. The inferior vena cava is dilated in size with >50% respiratory  variability, suggesting right atrial pressure of 8 mmHg.   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  no inhaler   Substance abuse ([place])    ETOH   TOBACCO ABUSE 12/23/2008   TRIGGER FINGER 05/05/2007   Tuberculosis    pos PPD    Past Surgical History:  Procedure Laterality Date   CHEST TUBE INSERTION  04/03/2012   Procedure: CHEST TUBE INSERTION;  Surgeon: Zenovia Jarred, MD;  Location: [place];  Service: General;  Laterality: Left;   CHOLECYSTECTOMY  07/28/2012    Procedure: LAPAROSCOPIC CHOLECYSTECTOMY;  Surgeon: Harl Bowie, MD;  [facility];  Service: General;  Laterality: N/A;   EXTERNAL FIXATION LEG  04/03/2012   Procedure: EXTERNAL FIXATION LEG;  Surgeon: Rozanna Box, MD;  Location: [place];  Service: Orthopedics;  Laterality: Left;  Left femur   EXTERNAL FIXATION PELVIS  04/03/2012   Procedure: EXTERNAL FIXATION PELVIS;  Surgeon: Rozanna Box, MD;  Location: [place];  Service: Orthopedics;;   FEMUR IM NAIL  04/07/2012   Procedure: INTRAMEDULLARY (IM) NAIL FEMORAL;  Surgeon: Rozanna Box, MD;  Location: [place];  Service: Orthopedics;  Laterality: Left;   FLEXIBLE BRONCHOSCOPY  04/07/2012   Procedure: FLEXIBLE BRONCHOSCOPY;  Surgeon: Zenovia Jarred, MD;  Location: [place];  Service: General;;  START TIME=1645 END TIME=1700   HARDWARE REMOVAL Left 04/01/2022   Procedure: LEFT HIP REMOVE SCREWS AND RETAINED FEMORAL NAIL;  Surgeon: Frederik Pear, MD;  Location[facility];  Service: Orthopedics;  Laterality: Left;   INCISION AND DRAINAGE OF WOUND  04/03/2012   Procedure: IRRIGATION AND DEBRIDEMENT WOUND;  Surgeon: Otilio Connors, MD;  Location: [place];  Service: Neurosurgery;  Laterality: N/A;  Frontal.   ORIF PATELLA  04/07/2012   Procedure: OPEN REDUCTION INTERNAL (ORIF) FIXATION PATELLA;  Surgeon: Rozanna Box, MD;  Location: [place];  Service: Orthopedics;  Laterality: Left;   ORIF PELVIC FRACTURE  04/07/2012   Procedure: OPEN REDUCTION INTERNAL FIXATION (ORIF) PELVIC FRACTURE;  Surgeon: Rozanna Box, MD;  Location: [place];  Service: Orthopedics;  Laterality: N/A;  Right and left sacroiliac screw pinning,Irrigation and debridebridement open tibia and femur,removal external fixator.   TIBIA IM NAIL INSERTION  04/07/2012   Procedure: INTRAMEDULLARY (IM) NAIL TIBIAL;  Surgeon: Rozanna Box, MD;  Location: [place];  Service: Orthopedics;  Laterality: Left;    MEDICATIONS:  albuterol (VENTOLIN HFA) 108 (90 Base) MCG/ACT inhaler   amoxicillin  (AMOXIL) 875 MG tablet   aspirin EC 81 MG tablet   B-D 3CC LUER-LOK SYR 18GX1-1/2 18G X 1-1/2" 3 ML MISC   B-D 3CC LUER-LOK SYR 22GX1" 22G X 1" 3 ML MISC   Blood Pressure KIT   chlorthalidone (HYGROTON) 25 MG tablet   cholecalciferol (VITAMIN D) 1000 UNITS tablet   diclofenac Sodium (VOLTAREN) 1 % GEL   diphenhydrAMINE (BENADRYL) 25 MG tablet   fluticasone (FLONASE) 50 MCG/ACT nasal spray   gabapentin (NEURONTIN) 300 MG capsule   lisinopril (ZESTRIL) 20 MG tablet   metFORMIN (GLUCOPHAGE) 1000 MG tablet   methocarbamol (ROBAXIN) 500 MG tablet   naloxone (NARCAN) nasal spray 4 mg/0.1 mL   oxyCODONE-acetaminophen (PERCOCET) 10-325 MG tablet   pantoprazole (PROTONIX) 40 MG tablet   pravastatin (PRAVACHOL) 20 MG tablet   Semaglutide, 2 MG/DOSE, (OZEMPIC, 2 MG/DOSE,) 8 MG/3ML SOPN   sildenafil (VIAGRA) 100 MG tablet   testosterone cypionate (DEPOTESTOSTERONE CYPIONATE) 200 MG/ML injection   No current facility-administered  medications for this encounter.    Konrad Felix Ward, PA-C [facility] [phone]

## 2022-07-25 NOTE — Anesthesia Preprocedure Evaluation (Addendum)
Anesthesia Evaluation  Patient identified by MRN, date of birth, ID band Patient awake    Reviewed: Allergy & Precautions, H&P , NPO status , Patient's Chart, lab work & pertinent test results  Airway Mallampati: II  TM Distance: >3 FB Neck ROM: Full    Dental no notable dental hx.    Pulmonary sleep apnea , former smoker   Pulmonary exam normal breath sounds clear to auscultation       Cardiovascular hypertension, Pt. on medications Normal cardiovascular exam Rhythm:Regular Rate:Normal     Neuro/Psych  Headaches  Neuromuscular disease  negative psych ROS   GI/Hepatic Neg liver ROS,GERD  ,,  Endo/Other  diabetes, Type 2    Renal/GU negative Renal ROS  negative genitourinary   Musculoskeletal  (+) Arthritis , Osteoarthritis,    Abdominal  (+) + obese  Peds negative pediatric ROS (+)  Hematology negative hematology ROS (+)   Anesthesia Other Findings   Reproductive/Obstetrics negative OB ROS                             Anesthesia Physical Anesthesia Plan  ASA: 3  Anesthesia Plan: General   Post-op Pain Management: Ofirmev IV (intra-op)*   Induction: Intravenous  PONV Risk Score and Plan: 2 and Ondansetron, Treatment may vary due to age or medical condition and Midazolam  Airway Management Planned: Oral ETT  Additional Equipment:   Intra-op Plan:   Post-operative Plan: Extubation in OR  Informed Consent: I have reviewed the patients History and Physical, chart, labs and discussed the procedure including the risks, benefits and alternatives for the proposed anesthesia with the patient or authorized representative who has indicated his/her understanding and acceptance.     Dental advisory given  Plan Discussed with: CRNA  Anesthesia Plan Comments: (See PAT note 07/16/2022)       Anesthesia Quick Evaluation

## 2022-07-29 ENCOUNTER — Inpatient Hospital Stay (HOSPITAL_COMMUNITY): Payer: Medicaid Other

## 2022-07-29 ENCOUNTER — Other Ambulatory Visit: Payer: Self-pay

## 2022-07-29 ENCOUNTER — Encounter (HOSPITAL_COMMUNITY): Payer: Self-pay | Admitting: Orthopedic Surgery

## 2022-07-29 ENCOUNTER — Inpatient Hospital Stay (HOSPITAL_COMMUNITY): Payer: Medicaid Other | Admitting: Certified Registered Nurse Anesthetist

## 2022-07-29 ENCOUNTER — Inpatient Hospital Stay (HOSPITAL_COMMUNITY)
Admission: RE | Admit: 2022-07-29 | Discharge: 2022-07-30 | DRG: 470 | Disposition: A | Discharge status: Home / Self Care | Payer: Medicaid Other | Source: Ambulatory Visit | Attending: Orthopedic Surgery | Admitting: Orthopedic Surgery

## 2022-07-29 ENCOUNTER — Inpatient Hospital Stay (HOSPITAL_COMMUNITY): Payer: Medicaid Other | Admitting: Physician Assistant

## 2022-07-29 ENCOUNTER — Encounter (HOSPITAL_COMMUNITY)
Admission: RE | Disposition: A | Discharge status: Home / Self Care | Payer: Self-pay | Source: Ambulatory Visit | Attending: Orthopedic Surgery

## 2022-07-29 DIAGNOSIS — Z87891 Personal history of nicotine dependence: Secondary | ICD-10-CM

## 2022-07-29 DIAGNOSIS — Z96649 Presence of unspecified artificial hip joint: Secondary | ICD-10-CM

## 2022-07-29 DIAGNOSIS — G894 Chronic pain syndrome: Secondary | ICD-10-CM | POA: Diagnosis present

## 2022-07-29 DIAGNOSIS — G4733 Obstructive sleep apnea (adult) (pediatric): Secondary | ICD-10-CM | POA: Diagnosis present

## 2022-07-29 DIAGNOSIS — Z91013 Allergy to seafood: Secondary | ICD-10-CM | POA: Diagnosis not present

## 2022-07-29 DIAGNOSIS — Z833 Family history of diabetes mellitus: Secondary | ICD-10-CM

## 2022-07-29 DIAGNOSIS — Z880 Allergy status to penicillin: Secondary | ICD-10-CM | POA: Diagnosis not present

## 2022-07-29 DIAGNOSIS — E7800 Pure hypercholesterolemia, unspecified: Secondary | ICD-10-CM | POA: Diagnosis present

## 2022-07-29 DIAGNOSIS — Z96642 Presence of left artificial hip joint: Principal | ICD-10-CM

## 2022-07-29 DIAGNOSIS — G4730 Sleep apnea, unspecified: Secondary | ICD-10-CM | POA: Diagnosis not present

## 2022-07-29 DIAGNOSIS — M1652 Unilateral post-traumatic osteoarthritis, left hip: Secondary | ICD-10-CM

## 2022-07-29 DIAGNOSIS — I10 Essential (primary) hypertension: Secondary | ICD-10-CM | POA: Diagnosis not present

## 2022-07-29 DIAGNOSIS — M1612 Unilateral primary osteoarthritis, left hip: Principal | ICD-10-CM | POA: Diagnosis present

## 2022-07-29 DIAGNOSIS — Z8249 Family history of ischemic heart disease and other diseases of the circulatory system: Secondary | ICD-10-CM

## 2022-07-29 DIAGNOSIS — K219 Gastro-esophageal reflux disease without esophagitis: Secondary | ICD-10-CM | POA: Diagnosis present

## 2022-07-29 DIAGNOSIS — E1140 Type 2 diabetes mellitus with diabetic neuropathy, unspecified: Secondary | ICD-10-CM | POA: Diagnosis present

## 2022-07-29 DIAGNOSIS — M25762 Osteophyte, left knee: Secondary | ICD-10-CM | POA: Diagnosis not present

## 2022-07-29 DIAGNOSIS — E119 Type 2 diabetes mellitus without complications: Secondary | ICD-10-CM

## 2022-07-29 DIAGNOSIS — I110 Hypertensive heart disease with heart failure: Secondary | ICD-10-CM | POA: Diagnosis present

## 2022-07-29 DIAGNOSIS — Z91041 Radiographic dye allergy status: Secondary | ICD-10-CM | POA: Diagnosis not present

## 2022-07-29 HISTORY — PX: TOTAL HIP ARTHROPLASTY: SHX124

## 2022-07-29 LAB — GLUCOSE, CAPILLARY
Glucose-Capillary: 75 mg/dL (ref 70–99)
Glucose-Capillary: 105 mg/dL — ABNORMAL HIGH (ref 70–99)
Glucose-Capillary: 132 mg/dL — ABNORMAL HIGH (ref 70–99)

## 2022-07-29 LAB — ABO/RH: ABO/RH(D): A POS

## 2022-07-29 SURGERY — ARTHROPLASTY, HIP, TOTAL, ANTERIOR APPROACH
Anesthesia: General | Site: Hip | Laterality: Left

## 2022-07-29 MED ORDER — PROPOFOL 10 MG/ML IV BOLUS
INTRAVENOUS | Status: AC
  Filled 2022-07-29: qty 20

## 2022-07-29 MED ORDER — SUGAMMADEX SODIUM 200 MG/2ML IV SOLN
INTRAVENOUS | Status: DC | PRN
  Administered 2022-07-29: 200 mg via INTRAVENOUS

## 2022-07-29 MED ORDER — KCL IN DEXTROSE-NACL 20-5-0.45 MEQ/L-%-% IV SOLN
INTRAVENOUS | Status: DC
  Filled 2022-07-29 (×3): qty 1000

## 2022-07-29 MED ORDER — VANCOMYCIN HCL 1500 MG/300ML IV SOLN
1500.0000 mg | INTRAVENOUS | Status: AC
  Administered 2022-07-29: 1500 mg via INTRAVENOUS
  Filled 2022-07-29: qty 300

## 2022-07-29 MED ORDER — TRANEXAMIC ACID 1000 MG/10ML IV SOLN
INTRAVENOUS | Status: DC | PRN
  Administered 2022-07-29: 2000 mg via TOPICAL

## 2022-07-29 MED ORDER — DIPHENHYDRAMINE HCL 12.5 MG/5ML PO ELIX: 12.5000 mg | ORAL_SOLUTION | ORAL | Status: DC | PRN

## 2022-07-29 MED ORDER — OXYCODONE HCL 5 MG PO TABS
5.0000 mg | ORAL_TABLET | ORAL | Status: DC | PRN
  Administered 2022-07-29 – 2022-07-30 (×2): 10 mg via ORAL
  Filled 2022-07-29 (×2): qty 2

## 2022-07-29 MED ORDER — LACTATED RINGERS IV SOLN: INTRAVENOUS | Status: DC

## 2022-07-29 MED ORDER — 0.9 % SODIUM CHLORIDE (POUR BTL) OPTIME
TOPICAL | Status: DC | PRN
  Administered 2022-07-29: 1000 mL

## 2022-07-29 MED ORDER — FLEET ENEMA 7-19 GM/118ML RE ENEM: 1.0000 | ENEMA | Freq: Once | RECTAL | Status: DC | PRN

## 2022-07-29 MED ORDER — MUPIROCIN 2 % EX OINT
TOPICAL_OINTMENT | CUTANEOUS | Status: AC
  Administered 2022-07-29: 1 via TOPICAL
  Filled 2022-07-29: qty 22

## 2022-07-29 MED ORDER — BUPIVACAINE LIPOSOME 1.3 % IJ SUSP: 10.0000 mL | Freq: Once | INTRAMUSCULAR | Status: DC

## 2022-07-29 MED ORDER — DEXAMETHASONE SODIUM PHOSPHATE 4 MG/ML IJ SOLN
INTRAMUSCULAR | Status: DC | PRN
  Administered 2022-07-29: 4 mg via INTRAVENOUS

## 2022-07-29 MED ORDER — FLUTICASONE PROPIONATE 50 MCG/ACT NA SUSP: 2.0000 | Freq: Every day | NASAL | Status: DC | PRN

## 2022-07-29 MED ORDER — MIDAZOLAM HCL 5 MG/5ML IJ SOLN
INTRAMUSCULAR | Status: DC | PRN
  Administered 2022-07-29: 2 mg via INTRAVENOUS

## 2022-07-29 MED ORDER — TRANEXAMIC ACID 1000 MG/10ML IV SOLN
2000.0000 mg | INTRAVENOUS | Status: DC
  Filled 2022-07-29: qty 20

## 2022-07-29 MED ORDER — PANTOPRAZOLE SODIUM 40 MG PO TBEC
40.0000 mg | DELAYED_RELEASE_TABLET | Freq: Every day | ORAL | Status: DC
  Administered 2022-07-30: 40 mg via ORAL
  Filled 2022-07-29: qty 1

## 2022-07-29 MED ORDER — PROPOFOL 1000 MG/100ML IV EMUL
INTRAVENOUS | Status: AC
  Filled 2022-07-29: qty 100

## 2022-07-29 MED ORDER — MIDAZOLAM HCL 2 MG/2ML IJ SOLN
INTRAMUSCULAR | Status: AC
  Filled 2022-07-29: qty 2

## 2022-07-29 MED ORDER — ESMOLOL HCL 100 MG/10ML IV SOLN
INTRAVENOUS | Status: AC
  Filled 2022-07-29: qty 10

## 2022-07-29 MED ORDER — BUPIVACAINE LIPOSOME 1.3 % IJ SUSP
INTRAMUSCULAR | Status: AC
  Filled 2022-07-29: qty 10

## 2022-07-29 MED ORDER — PHENOL 1.4 % MT LIQD: 1.0000 | OROMUCOSAL | Status: DC | PRN

## 2022-07-29 MED ORDER — FENTANYL CITRATE (PF) 100 MCG/2ML IJ SOLN
INTRAMUSCULAR | Status: DC | PRN
  Administered 2022-07-29 (×3): 50 ug via INTRAVENOUS
  Administered 2022-07-29: 100 ug via INTRAVENOUS

## 2022-07-29 MED ORDER — METHOCARBAMOL 500 MG PO TABS
500.0000 mg | ORAL_TABLET | Freq: Four times a day (QID) | ORAL | Status: DC | PRN
  Administered 2022-07-29: 500 mg via ORAL
  Filled 2022-07-29: qty 1

## 2022-07-29 MED ORDER — ONDANSETRON HCL 4 MG/2ML IJ SOLN: 4.0000 mg | Freq: Four times a day (QID) | INTRAMUSCULAR | Status: DC | PRN

## 2022-07-29 MED ORDER — SODIUM CHLORIDE (PF) 0.9 % IJ SOLN
INTRAMUSCULAR | Status: AC
  Filled 2022-07-29: qty 50

## 2022-07-29 MED ORDER — VITAMIN D 25 MCG (1000 UNIT) PO TABS
1000.0000 [IU] | ORAL_TABLET | Freq: Every day | ORAL | Status: DC
  Administered 2022-07-29: 1000 [IU] via ORAL
  Filled 2022-07-29: qty 1

## 2022-07-29 MED ORDER — BUPIVACAINE-EPINEPHRINE (PF) 0.5% -1:200000 IJ SOLN
INTRAMUSCULAR | Status: AC
  Filled 2022-07-29: qty 30

## 2022-07-29 MED ORDER — HYDROMORPHONE HCL 2 MG PO TABS
2.0000 mg | ORAL_TABLET | ORAL | Status: DC | PRN
  Administered 2022-07-30: 2 mg via ORAL
  Filled 2022-07-29: qty 1

## 2022-07-29 MED ORDER — ESMOLOL HCL 100 MG/10ML IV SOLN
INTRAVENOUS | Status: DC | PRN
  Administered 2022-07-29: 20 mg via INTRAVENOUS

## 2022-07-29 MED ORDER — OXYCODONE HCL 5 MG PO TABS: 5.0000 mg | ORAL_TABLET | Freq: Once | ORAL | Status: DC | PRN

## 2022-07-29 MED ORDER — BUPIVACAINE LIPOSOME 1.3 % IJ SUSP
INTRAMUSCULAR | Status: DC | PRN
  Administered 2022-07-29: 10 mL

## 2022-07-29 MED ORDER — METHOCARBAMOL 1000 MG/10ML IJ SOLN: 500.0000 mg | Freq: Four times a day (QID) | INTRAVENOUS | Status: DC | PRN

## 2022-07-29 MED ORDER — METOCLOPRAMIDE HCL 5 MG/ML IJ SOLN: 5.0000 mg | Freq: Three times a day (TID) | INTRAMUSCULAR | Status: DC | PRN

## 2022-07-29 MED ORDER — HYDROMORPHONE HCL 1 MG/ML IJ SOLN
0.5000 mg | INTRAMUSCULAR | Status: DC | PRN
  Administered 2022-07-29 – 2022-07-30 (×3): 1 mg via INTRAVENOUS
  Filled 2022-07-29 (×3): qty 1

## 2022-07-29 MED ORDER — CHLORTHALIDONE 25 MG PO TABS
25.0000 mg | ORAL_TABLET | Freq: Every day | ORAL | Status: DC
  Administered 2022-07-30: 25 mg via ORAL
  Filled 2022-07-29: qty 1

## 2022-07-29 MED ORDER — POVIDONE-IODINE 10 % EX SWAB: 2.0000 | Freq: Once | CUTANEOUS | Status: DC

## 2022-07-29 MED ORDER — GABAPENTIN 300 MG PO CAPS
900.0000 mg | ORAL_CAPSULE | Freq: Three times a day (TID) | ORAL | Status: DC
  Administered 2022-07-29 – 2022-07-30 (×3): 900 mg via ORAL
  Filled 2022-07-29 (×3): qty 3

## 2022-07-29 MED ORDER — CHLORHEXIDINE GLUCONATE 0.12 % MT SOLN
15.0000 mL | Freq: Once | OROMUCOSAL | Status: AC
  Administered 2022-07-29: 15 mL via OROMUCOSAL

## 2022-07-29 MED ORDER — LISINOPRIL 20 MG PO TABS
20.0000 mg | ORAL_TABLET | Freq: Every day | ORAL | Status: DC
  Administered 2022-07-30: 20 mg via ORAL
  Filled 2022-07-29: qty 1

## 2022-07-29 MED ORDER — BUPIVACAINE-EPINEPHRINE 0.5% -1:200000 IJ SOLN
INTRAMUSCULAR | Status: DC | PRN
  Administered 2022-07-29: 30 mL

## 2022-07-29 MED ORDER — TRANEXAMIC ACID-NACL 1000-0.7 MG/100ML-% IV SOLN
1000.0000 mg | INTRAVENOUS | Status: AC
  Administered 2022-07-29: 1000 mg via INTRAVENOUS
  Filled 2022-07-29: qty 100

## 2022-07-29 MED ORDER — ONDANSETRON HCL 4 MG PO TABS: 4.0000 mg | ORAL_TABLET | Freq: Four times a day (QID) | ORAL | Status: DC | PRN

## 2022-07-29 MED ORDER — PROPOFOL 10 MG/ML IV BOLUS
INTRAVENOUS | Status: DC | PRN
  Administered 2022-07-29: 160 mg via INTRAVENOUS

## 2022-07-29 MED ORDER — FENTANYL CITRATE (PF) 250 MCG/5ML IJ SOLN
INTRAMUSCULAR | Status: AC
  Filled 2022-07-29: qty 5

## 2022-07-29 MED ORDER — METFORMIN HCL 500 MG PO TABS
1000.0000 mg | ORAL_TABLET | Freq: Two times a day (BID) | ORAL | Status: DC
  Administered 2022-07-30: 1000 mg via ORAL
  Filled 2022-07-29: qty 2

## 2022-07-29 MED ORDER — BISACODYL 5 MG PO TBEC: 5.0000 mg | DELAYED_RELEASE_TABLET | Freq: Every day | ORAL | Status: DC | PRN

## 2022-07-29 MED ORDER — METOCLOPRAMIDE HCL 5 MG PO TABS: 5.0000 mg | ORAL_TABLET | Freq: Three times a day (TID) | ORAL | Status: DC | PRN

## 2022-07-29 MED ORDER — ALBUTEROL SULFATE (2.5 MG/3ML) 0.083% IN NEBU: 2.5000 mg | INHALATION_SOLUTION | Freq: Four times a day (QID) | RESPIRATORY_TRACT | Status: DC | PRN

## 2022-07-29 MED ORDER — PHENYLEPHRINE 80 MCG/ML (10ML) SYRINGE FOR IV PUSH (FOR BLOOD PRESSURE SUPPORT)
PREFILLED_SYRINGE | INTRAVENOUS | Status: DC | PRN
  Administered 2022-07-29 (×3): 80 ug via INTRAVENOUS

## 2022-07-29 MED ORDER — SODIUM CHLORIDE 0.9% FLUSH
INTRAVENOUS | Status: DC | PRN
  Administered 2022-07-29: 50 mL

## 2022-07-29 MED ORDER — ACETAMINOPHEN 500 MG PO TABS
1000.0000 mg | ORAL_TABLET | Freq: Four times a day (QID) | ORAL | Status: AC
  Administered 2022-07-29 – 2022-07-30 (×4): 1000 mg via ORAL
  Filled 2022-07-29 (×4): qty 2

## 2022-07-29 MED ORDER — INSULIN ASPART 100 UNIT/ML IJ SOLN
0.0000 [IU] | Freq: Three times a day (TID) | INTRAMUSCULAR | Status: DC
  Administered 2022-07-30: 3 [IU] via SUBCUTANEOUS

## 2022-07-29 MED ORDER — PHENYLEPHRINE 80 MCG/ML (10ML) SYRINGE FOR IV PUSH (FOR BLOOD PRESSURE SUPPORT)
PREFILLED_SYRINGE | INTRAVENOUS | Status: AC
  Filled 2022-07-29: qty 10

## 2022-07-29 MED ORDER — HYDROMORPHONE HCL 1 MG/ML IJ SOLN
0.2500 mg | INTRAMUSCULAR | Status: DC | PRN
  Administered 2022-07-29 (×2): 0.5 mg via INTRAVENOUS

## 2022-07-29 MED ORDER — DOCUSATE SODIUM 100 MG PO CAPS
100.0000 mg | ORAL_CAPSULE | Freq: Two times a day (BID) | ORAL | Status: DC
  Filled 2022-07-29 (×2): qty 1

## 2022-07-29 MED ORDER — OXYCODONE HCL 5 MG/5ML PO SOLN: 5.0000 mg | Freq: Once | ORAL | Status: DC | PRN

## 2022-07-29 MED ORDER — ASPIRIN 81 MG PO CHEW
81.0000 mg | CHEWABLE_TABLET | Freq: Two times a day (BID) | ORAL | Status: DC
  Administered 2022-07-30: 81 mg via ORAL
  Filled 2022-07-29: qty 1

## 2022-07-29 MED ORDER — DEXAMETHASONE SODIUM PHOSPHATE 10 MG/ML IJ SOLN
10.0000 mg | Freq: Once | INTRAMUSCULAR | Status: AC
  Administered 2022-07-30: 10 mg via INTRAVENOUS
  Filled 2022-07-29: qty 1

## 2022-07-29 MED ORDER — ONDANSETRON HCL 4 MG/2ML IJ SOLN
INTRAMUSCULAR | Status: AC
  Filled 2022-07-29: qty 2

## 2022-07-29 MED ORDER — LIDOCAINE 2% (20 MG/ML) 5 ML SYRINGE
INTRAMUSCULAR | Status: DC | PRN
  Administered 2022-07-29: 100 mg via INTRAVENOUS

## 2022-07-29 MED ORDER — TRANEXAMIC ACID-NACL 1000-0.7 MG/100ML-% IV SOLN
1000.0000 mg | Freq: Once | INTRAVENOUS | Status: AC
  Administered 2022-07-29: 1000 mg via INTRAVENOUS
  Filled 2022-07-29: qty 100

## 2022-07-29 MED ORDER — POLYETHYLENE GLYCOL 3350 17 G PO PACK: 17.0000 g | PACK | Freq: Every day | ORAL | Status: DC | PRN

## 2022-07-29 MED ORDER — MUPIROCIN 2 % EX OINT: 1.0000 | TOPICAL_OINTMENT | Freq: Two times a day (BID) | CUTANEOUS | Status: DC

## 2022-07-29 MED ORDER — HYDROMORPHONE HCL 1 MG/ML IJ SOLN
INTRAMUSCULAR | Status: AC
  Filled 2022-07-29: qty 1

## 2022-07-29 MED ORDER — MENTHOL 3 MG MT LOZG: 1.0000 | LOZENGE | OROMUCOSAL | Status: DC | PRN

## 2022-07-29 MED ORDER — ROCURONIUM BROMIDE 10 MG/ML (PF) SYRINGE
PREFILLED_SYRINGE | INTRAVENOUS | Status: DC | PRN
  Administered 2022-07-29: 100 mg via INTRAVENOUS

## 2022-07-29 MED ORDER — ORAL CARE MOUTH RINSE: 15.0000 mL | Freq: Once | OROMUCOSAL | Status: AC

## 2022-07-29 MED ORDER — ALBUTEROL SULFATE HFA 108 (90 BASE) MCG/ACT IN AERS: 1.0000 | INHALATION_SPRAY | Freq: Four times a day (QID) | RESPIRATORY_TRACT | Status: DC | PRN

## 2022-07-29 MED ORDER — ACETAMINOPHEN 325 MG PO TABS: 325.0000 mg | ORAL_TABLET | Freq: Four times a day (QID) | ORAL | Status: DC | PRN

## 2022-07-29 MED ORDER — DEXAMETHASONE SODIUM PHOSPHATE 10 MG/ML IJ SOLN
INTRAMUSCULAR | Status: AC
  Filled 2022-07-29: qty 1

## 2022-07-29 MED ORDER — PROMETHAZINE HCL 25 MG/ML IJ SOLN: 6.2500 mg | INTRAMUSCULAR | Status: DC | PRN

## 2022-07-29 SURGICAL SUPPLY — 49 items
BAG COUNTER SPONGE SURGICOUNT (BAG) ×2 IMPLANT
BAG DECANTER FOR FLEXI CONT (MISCELLANEOUS) ×4 IMPLANT
BAG SPNG CNTER NS LX DISP (BAG) ×1
BLADE SAW SGTL 18X1.27X75 (BLADE) ×2 IMPLANT
CNTNR URN SCR LID CUP LEK RST (MISCELLANEOUS) IMPLANT
CONT SPEC 4OZ STRL OR WHT (MISCELLANEOUS) ×1
COVER PERINEAL POST (MISCELLANEOUS) ×2 IMPLANT
COVER SURGICAL LIGHT HANDLE (MISCELLANEOUS) ×2 IMPLANT
DRAPE STERI IOBAN 125X83 (DRAPES) ×2 IMPLANT
DRAPE U-SHAPE 47X51 STRL (DRAPES) ×4 IMPLANT
DRSG AQUACEL AG ADV 3.5X10 (GAUZE/BANDAGES/DRESSINGS) ×2 IMPLANT
DURAPREP 26ML APPLICATOR (WOUND CARE) ×2 IMPLANT
ELECT BLADE TIP CTD 4 INCH (ELECTRODE) ×2 IMPLANT
ELECT REM PT RETURN 15FT ADLT (MISCELLANEOUS) ×2 IMPLANT
ELIMINATOR HOLE APEX DEPUY (Hips) IMPLANT
FACESHIELD WRAPAROUND (MASK) ×5 IMPLANT
FACESHIELD WRAPAROUND OR TEAM (MASK) IMPLANT
GLOVE BIO SURGEON STRL SZ7.5 (GLOVE) ×2 IMPLANT
GLOVE BIO SURGEON STRL SZ8.5 (GLOVE) ×2 IMPLANT
GLOVE BIOGEL PI IND STRL 8 (GLOVE) ×2 IMPLANT
GLOVE BIOGEL PI IND STRL 9 (GLOVE) ×2 IMPLANT
GOWN STRL REUS W/ TWL XL LVL3 (GOWN DISPOSABLE) ×4 IMPLANT
GOWN STRL REUS W/TWL XL LVL3 (GOWN DISPOSABLE) ×2
HEAD CERAMIC 36 PLUS 8.5 12 14 (Hips) IMPLANT
HOLDER FOLEY CATH W/STRAP (MISCELLANEOUS) ×2 IMPLANT
HOOD PEEL AWAY FLYTE STAYCOOL (MISCELLANEOUS) IMPLANT
KIT TURNOVER KIT A (KITS) IMPLANT
MANIFOLD NEPTUNE II (INSTRUMENTS) ×2 IMPLANT
NDL HYPO 21X1.5 SAFETY (NEEDLE) ×4 IMPLANT
NEEDLE HYPO 21X1.5 SAFETY (NEEDLE) ×2 IMPLANT
NS IRRIG 1000ML POUR BTL (IV SOLUTION) ×2 IMPLANT
PACK ANTERIOR HIP CUSTOM (KITS) ×2 IMPLANT
PIN SECT CUP 56MM (Hips) IMPLANT
PINNACLE ALTRX PLUS 4 N 36X56 (Hips) IMPLANT
SPIKE FLUID TRANSFER (MISCELLANEOUS) ×2 IMPLANT
STEM FEMORAL SZ9 HIGH ACTIS (Stem) IMPLANT
SUT ETHIBOND NAB CT1 #1 30IN (SUTURE) IMPLANT
SUT VIC AB 0 CT1 27 (SUTURE)
SUT VIC AB 0 CT1 27XBRD ANBCTR (SUTURE) IMPLANT
SUT VIC AB 1 CTX 36 (SUTURE) ×1
SUT VIC AB 1 CTX36XBRD ANBCTR (SUTURE) ×2 IMPLANT
SUT VIC AB 2-0 CT1 27 (SUTURE) ×1
SUT VIC AB 2-0 CT1 TAPERPNT 27 (SUTURE) ×2 IMPLANT
SUT VIC AB 3-0 CT1 27 (SUTURE) ×1
SUT VIC AB 3-0 CT1 TAPERPNT 27 (SUTURE) ×2 IMPLANT
SYR CONTROL 10ML LL (SYRINGE) ×6 IMPLANT
TRAY FOLEY MTR SLVR 16FR STAT (SET/KITS/TRAYS/PACK) IMPLANT
TUBE SUCTION HIGH CAP CLEAR NV (SUCTIONS) IMPLANT
WATER STERILE IRR 1000ML POUR (IV SOLUTION) IMPLANT

## 2022-07-29 NOTE — Transfer of Care (Signed)
Immediate Anesthesia Transfer of Care Note  Patient: Miguel Brady  Procedure(s) Performed: Procedure(s): LEFT TOTAL HIP ARTHROPLASTY ANTERIOR APPROACH (Left)  Patient Location: PACU  Anesthesia Type:General  Level of Consciousness: Alert, Awake, Oriented  Airway & Oxygen Therapy: Patient Spontanous Breathing  Post-op Assessment: Report given to RN  Post vital signs: Reviewed and stable  Last Vitals:  Vitals:   07/29/22 1039  BP: 133/74  Pulse: 68  Resp: 17  Temp: 36.6 C  SpO2: 409%    Complications: No apparent anesthesia complications

## 2022-07-29 NOTE — Discharge Instructions (Signed)
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
## 2022-07-29 NOTE — Anesthesia Postprocedure Evaluation (Signed)
Anesthesia Post Note  Patient: Miguel Brady  Procedure(s) Performed: LEFT TOTAL HIP ARTHROPLASTY ANTERIOR APPROACH (Left: Hip)     Patient location during evaluation: PACU Anesthesia Type: General Level of consciousness: awake and alert Pain management: pain level controlled Vital Signs Assessment: post-procedure vital signs reviewed and stable Respiratory status: spontaneous breathing, nonlabored ventilation, respiratory function stable and patient connected to nasal cannula oxygen Cardiovascular status: blood pressure returned to baseline and stable Postop Assessment: no apparent nausea or vomiting Anesthetic complications: no   No notable events documented.  Last Vitals:  Vitals:   07/29/22 1645 07/29/22 1700  BP: (!) 140/80 (!) 153/95  Pulse: 100 94  Resp: 11 14  Temp:    SpO2: 90% 97%    Last Pain:  Vitals:   07/29/22 1700  TempSrc:   PainSc: 0-No pain    LLE Motor Response: Purposeful movement (07/29/22 1700) LLE Sensation: Full sensation (07/29/22 1700)          Hasset Chaviano S
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
## 2022-07-29 NOTE — Interval H&P Note (Signed)
History and Physical Interval Note:  07/29/2022 12:13 PM  Miguel Brady  has presented today for surgery, with the diagnosis of LEFT HIP OSTEOARTHRITIS POST TRAMATIC.  The various methods of treatment have been discussed with the patient and family. After consideration of risks, benefits and other options for treatment, the patient has consented to  Procedure(s): LEFT TOTAL HIP ARTHROPLASTY ANTERIOR APPROACH (Left) as a surgical intervention.  The patient's history has been reviewed, patient examined, no change in status, stable for surgery.  I have reviewed the patient's chart and labs.  Questions were answered to the patient's satisfaction.     Kerin Salen

## 2022-07-29 NOTE — Anesthesia Procedure Notes (Signed)
Procedure Name: Intubation Date/Time: 07/29/2022 1:07 PM  Performed by: Deliah Boston, CRNAPre-anesthesia Checklist: Patient identified, Emergency Drugs available, Suction available and Patient being monitored Patient Re-evaluated:Patient Re-evaluated prior to induction Oxygen Delivery Method: Circle system utilized Preoxygenation: Pre-oxygenation with 100% oxygen Induction Type: IV induction Ventilation: Mask ventilation without difficulty Laryngoscope Size: Mac and 4 Grade View: Grade II Tube type: Oral Tube size: 7.5 mm Number of attempts: 1 Airway Equipment and Method: Stylet Placement Confirmation: ETT inserted through vocal cords under direct vision, positive ETCO2 and breath sounds checked- equal and bilateral Secured at: 23 cm Tube secured with: Tape Dental Injury: Teeth and Oropharynx as per pre-operative assessment

## 2022-07-30 ENCOUNTER — Other Ambulatory Visit: Payer: Self-pay

## 2022-07-30 ENCOUNTER — Encounter (HOSPITAL_COMMUNITY): Payer: Self-pay | Admitting: Orthopedic Surgery

## 2022-07-30 LAB — GLUCOSE, CAPILLARY
Glucose-Capillary: 116 mg/dL — ABNORMAL HIGH (ref 70–99)
Glucose-Capillary: 158 mg/dL — ABNORMAL HIGH (ref 70–99)

## 2022-07-30 MED ORDER — TIZANIDINE HCL 2 MG PO TABS: 2.0000 mg | ORAL_TABLET | Freq: Four times a day (QID) | ORAL | 0 refills | Status: DC | PRN

## 2022-07-30 MED ORDER — OXYCODONE HCL 5 MG PO TABS: 5.0000 mg | ORAL_TABLET | ORAL | 0 refills | Status: DC | PRN

## 2022-07-30 MED ORDER — ASPIRIN 81 MG PO TBEC: 81.0000 mg | DELAYED_RELEASE_TABLET | Freq: Two times a day (BID) | ORAL | 0 refills | Status: DC

## 2022-07-30 NOTE — Progress Notes (Signed)
Discharge package printed and instructions given to patient. Patient verbalizes understanding. 
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
## 2022-07-30 NOTE — Progress Notes (Signed)
PATIENT ID: Miguel Brady  MRN: [id number]  DOB/AGE:  [birth year] / 61 y.o.  1 Day Post-Op Procedure(s) (LRB): LEFT TOTAL HIP ARTHROPLASTY ANTERIOR APPROACH (Left)    PROGRESS NOTE Subjective: Patient is alert, oriented, no Nausea, no Vomiting, yes passing gas, . Taking PO well. Denies SOB, Chest or Calf Pain. Using Incentive Spirometer, PAS in place. Ambulate 74' Patient reports pain as  2/10  .    Objective: Vital signs in last 24 hours: Vitals:   07/29/22 1804 07/29/22 1944 07/29/22 2355 07/30/22 0351  BP: (!) 142/87 (!) 143/79 (!) 139/90 131/83  Pulse: 93 99 92 84  Resp: '15 17 17 17  '$ Temp: 98.5 F (36.9 C) 98.3 F (36.8 C) 98.2 F (36.8 C) 98.7 F (37.1 C)  TempSrc: Oral Oral Oral Oral  SpO2: 98% 99% 100% 96%  Weight: 125.3 kg     Height: 6' (1.829 m)         Intake/Output from previous day: I/O last 3 completed shifts: In: 3378.9 [P.O.:600; I.V.:2678.9; IV Piggyback:100] Out: 4600 [Urine:3950; Blood:650]   Intake/Output this shift: No intake/output data recorded.   LABORATORY DATA: Recent Labs    07/29/22 1053 07/29/22 1556 07/29/22 2113  GLUCAP 75 105* 132*    Examination: Neurologically intact ABD soft Neurovascular intact Sensation intact distally Intact pulses distally Dorsiflexion/Plantar flexion intact Incision: dressing C/D/I No cellulitis present Compartment soft} XR AP&Lat of hip shows well placed\fixed THA  Assessment:   1 Day Post-Op Procedure(s) (LRB): LEFT TOTAL HIP ARTHROPLASTY ANTERIOR APPROACH (Left) ADDITIONAL DIAGNOSIS:  DM, GERD, HTN, hx TBI, Hx neuropathy, OSA, hx ETOH abuse, morbid obesity Anticipated LOS equal to or greater than 2 midnights due to - Age 61 and older with one or more of the following:  - Obesity  - Expected need for hospital services (PT, OT, Nursing) required for safe  discharge  - Anticipated need for postoperative skilled nursing care or inpatient rehab  - Active co-morbidities: Diabetes, TBI, OSA,  morbid obesity, deconditioning   Plan: PT/OT WBA  DVT Prophylaxis: SCDx72 hrs, ASA 81 mg BID x 2 weeks  DISCHARGE PLAN: Home, When he passes physical therapy  DISCHARGE NEEDS: HHPT, Walker, and 3-in-1 comode seatPatient ID: Miguel Brady, male   DOB: 1961/03/21, 61 y.o.   MRN: [id number]

## 2022-07-30 NOTE — Plan of Care (Signed)
  Problem: Fluid Volume: Goal: Ability to maintain a balanced intake and output will improve Outcome: Progressing   
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
## 2022-07-30 NOTE — TOC Transition Note (Signed)
Transition of Care [facility]) - CM/SW Discharge Note   Patient Details  Name: DARRIEL UTTER MRN: [id number] Date of Birth: 02-01-1961  Transition of Care [facility]) CM/SW Contact:  Lennart Pall, LCSW Phone Number: 07/30/2022, 2:48 PM   Clinical Narrative:    Met with pt to review possible dc needs.  Pt has needed DME at home.  He does request HHPT follow up if possible, however, have informed him that CSW unable to secure any agency to accept (due to insurance coverage of [place]).   Pt aware will need to follow HEP.  No further TOC needs.   Final next level of care: Home/Self Care Barriers to Discharge: No [facility] will accept this patient   Patient Goals and CMS Choice Patient states their goals for this hospitalization and ongoing recovery are:: return home      Discharge Placement                       Discharge Plan and Services                DME Arranged: N/A DME Agency: NA                  Social Determinants of Health (SDOH) Interventions     Readmission Risk Interventions    07/30/2022    2:47 PM  Readmission Risk Prevention Plan  Post Dischage Appt Complete  Medication Screening Complete  Transportation Screening Complete

## 2022-07-30 NOTE — Plan of Care (Signed)
  Problem: Activity: Goal: Ability to avoid complications of mobility impairment will improve Outcome: Progressing Goal: Ability to tolerate increased activity will improve Outcome: Progressing   Problem: Skin Integrity: Goal: Will show signs of wound healing Outcome: Progressing

## 2022-08-16 ENCOUNTER — Other Ambulatory Visit (HOSPITAL_BASED_OUTPATIENT_CLINIC_OR_DEPARTMENT_OTHER): Payer: Self-pay

## 2022-08-20 ENCOUNTER — Other Ambulatory Visit (HOSPITAL_BASED_OUTPATIENT_CLINIC_OR_DEPARTMENT_OTHER): Payer: Self-pay

## 2022-08-20 ENCOUNTER — Other Ambulatory Visit (HOSPITAL_COMMUNITY): Payer: Self-pay

## 2022-08-21 ENCOUNTER — Other Ambulatory Visit (HOSPITAL_COMMUNITY): Payer: Self-pay

## 2022-08-21 MED ORDER — OXYCODONE-ACETAMINOPHEN 10-325 MG PO TABS
1.0000 | ORAL_TABLET | Freq: Four times a day (QID) | ORAL | 0 refills | Status: DC | PRN
  Filled 2022-08-21: qty 120, 30d supply, fill #0

## 2022-08-23 ENCOUNTER — Encounter (HOSPITAL_COMMUNITY): Payer: Self-pay

## 2022-08-23 ENCOUNTER — Telehealth (HOSPITAL_BASED_OUTPATIENT_CLINIC_OR_DEPARTMENT_OTHER): Payer: Self-pay | Admitting: Cardiovascular Disease

## 2022-08-23 ENCOUNTER — Emergency Department (HOSPITAL_COMMUNITY): Payer: Medicaid Other

## 2022-08-23 ENCOUNTER — Other Ambulatory Visit: Payer: Self-pay

## 2022-08-23 ENCOUNTER — Emergency Department (HOSPITAL_COMMUNITY)
Admission: EM | Admit: 2022-08-23 | Discharge: 2022-08-23 | Disposition: A | Discharge status: Home / Self Care | Payer: Medicaid Other | Attending: Emergency Medicine | Admitting: Emergency Medicine

## 2022-08-23 DIAGNOSIS — R079 Chest pain, unspecified: Secondary | ICD-10-CM | POA: Diagnosis not present

## 2022-08-23 DIAGNOSIS — R0602 Shortness of breath: Secondary | ICD-10-CM | POA: Diagnosis present

## 2022-08-23 DIAGNOSIS — R7989 Other specified abnormal findings of blood chemistry: Secondary | ICD-10-CM | POA: Insufficient documentation

## 2022-08-23 LAB — CBC WITH DIFFERENTIAL/PLATELET
Abs Immature Granulocytes: 0.01 10*3/uL (ref 0.00–0.07)
Basophils Absolute: 0 10*3/uL (ref 0.0–0.1)
Basophils Relative: 0 %
Eosinophils Absolute: 0.1 10*3/uL (ref 0.0–0.5)
Eosinophils Relative: 2 %
HCT: 34.7 % — ABNORMAL LOW (ref 39.0–52.0)
Hemoglobin: 10.8 g/dL — ABNORMAL LOW (ref 13.0–17.0)
Immature Granulocytes: 0 %
Lymphocytes Relative: 33 %
Lymphs Abs: 2.4 10*3/uL (ref 0.7–4.0)
MCH: 29 pg (ref 26.0–34.0)
MCHC: 31.1 g/dL (ref 30.0–36.0)
MCV: 93 fL (ref 80.0–100.0)
Monocytes Absolute: 0.7 10*3/uL (ref 0.1–1.0)
Monocytes Relative: 9 %
Neutro Abs: 4 10*3/uL (ref 1.7–7.7)
Neutrophils Relative %: 56 %
Platelets: 309 10*3/uL (ref 150–400)
RBC: 3.73 MIL/uL — ABNORMAL LOW (ref 4.22–5.81)
RDW: 14 % (ref 11.5–15.5)
WBC: 7.2 10*3/uL (ref 4.0–10.5)
nRBC: 0 % (ref 0.0–0.2)

## 2022-08-23 LAB — COMPREHENSIVE METABOLIC PANEL
ALT: 10 U/L (ref 0–44)
AST: 16 U/L (ref 15–41)
Albumin: 3.5 g/dL (ref 3.5–5.0)
Alkaline Phosphatase: 89 U/L (ref 38–126)
Anion gap: 8 (ref 5–15)
BUN: 28 mg/dL — ABNORMAL HIGH (ref 6–20)
CO2: 29 mmol/L (ref 22–32)
Calcium: 9 mg/dL (ref 8.9–10.3)
Chloride: 101 mmol/L (ref 98–111)
Creatinine, Ser: 1.77 mg/dL — ABNORMAL HIGH (ref 0.61–1.24)
GFR, Estimated: 43 mL/min — ABNORMAL LOW (ref >60)
Glucose, Bld: 71 mg/dL (ref 70–99)
Potassium: 4 mmol/L (ref 3.5–5.1)
Sodium: 138 mmol/L (ref 135–145)
Total Bilirubin: 0.8 mg/dL (ref 0.3–1.2)
Total Protein: 8.8 g/dL — ABNORMAL HIGH (ref 6.5–8.1)

## 2022-08-23 LAB — TROPONIN I (HIGH SENSITIVITY)
Troponin I (High Sensitivity): 34 ng/L — ABNORMAL HIGH (ref <18)
Troponin I (High Sensitivity): 32 ng/L — ABNORMAL HIGH (ref <18)

## 2022-08-23 LAB — BRAIN NATRIURETIC PEPTIDE: B Natriuretic Peptide: 610.8 pg/mL — ABNORMAL HIGH (ref 0.0–100.0)

## 2022-08-23 LAB — D-DIMER, QUANTITATIVE: D-Dimer, Quant: 7.5 ug/mL-FEU — ABNORMAL HIGH (ref 0.00–0.50)

## 2022-08-23 MED ORDER — SODIUM CHLORIDE 0.9 % IV BOLUS
1000.0000 mL | Freq: Once | INTRAVENOUS | Status: AC
  Administered 2022-08-23: 1000 mL via INTRAVENOUS

## 2022-08-23 MED ORDER — HYDROMORPHONE HCL 1 MG/ML IJ SOLN
1.0000 mg | Freq: Once | INTRAMUSCULAR | Status: AC | PRN
  Administered 2022-08-23: 1 mg via INTRAVENOUS
  Filled 2022-08-23: qty 1

## 2022-08-23 MED ORDER — IOHEXOL 350 MG/ML SOLN
75.0000 mL | Freq: Once | INTRAVENOUS | Status: AC | PRN
  Administered 2022-08-23: 75 mL via INTRAVENOUS

## 2022-08-23 MED ORDER — METHOCARBAMOL 500 MG PO TABS
500.0000 mg | ORAL_TABLET | Freq: Once | ORAL | Status: AC
  Administered 2022-08-23: 500 mg via ORAL
  Filled 2022-08-23: qty 1

## 2022-08-23 MED ORDER — DIPHENHYDRAMINE HCL 50 MG/ML IJ SOLN
50.0000 mg | Freq: Once | INTRAMUSCULAR | Status: AC
  Administered 2022-08-23: 50 mg via INTRAVENOUS
  Filled 2022-08-23: qty 1

## 2022-08-23 NOTE — Telephone Encounter (Signed)
Call transferred from call center,   Told caller that he recently had hip surgery.   Patient states that he coughing up phlem that is dark brown and black. No shortness of breath, but chest is tight as can be he states. He is short with activity. He states it feels like a 600 pound weight sitting on his chest.   Advised patient to be seen urgently in the ED.

## 2022-08-23 NOTE — Telephone Encounter (Signed)
Pt c/o of Chest Pain: STAT if CP now or developed within 24 hours  1. Are you having CP right now? Yes, but states it is light right now   2. Are you experiencing any other symptoms (ex. SOB, nausea, vomiting, sweating)? SOB on exertion   3. How long have you been experiencing CP? Started on Monday   4. Is your CP continuous or coming and going? Continuous, but severity is inconsistent   5. Have you taken Nitroglycerin? No    Patient described pain as feeling as if he has a 600 lbs weight on his chest.

## 2022-08-23 NOTE — ED Provider Notes (Signed)
[facility] DEPT Provider Note   CSN: [id number] Arrival date & time: 08/23/22  1245     History {Add pertinent medical, surgical, social history, OB history to HPI:1} Chief Complaint  Patient presents with   Chest Pain   Weakness    Miguel Brady is a 61 y.o. male.  Patient complains of dyspnea on exertion and chest tightness for the last couple days.  It was worse last night. when he walks short distances he gets short of breath.  Patient has a history of diabetes and hyperlipidemia and had a left hip replacement done on November 13  The history is provided by the patient and medical records.  Shortness of Breath Severity:  Moderate Onset quality:  Sudden Timing:  Intermittent Progression:  Waxing and waning Chronicity:  New Context: activity   Relieved by:  Nothing Worsened by:  Nothing Ineffective treatments:  None tried Associated symptoms: no abdominal pain, no chest pain, no cough, no headaches and no rash        Home Medications Prior to Admission medications   Medication Sig Start Date End Date Taking? Authorizing Provider  albuterol (VENTOLIN HFA) 108 (90 Base) MCG/ACT inhaler Inhale 1-2 puffs into the lungs every 6 (six) hours as needed for wheezing or shortness of breath.    [provider]  aspirin EC 81 MG tablet Take 1 tablet (81 mg total) by mouth 2 (two) times daily. 07/30/22   Joanell Rising K, PA-C  B-D 3CC LUER-LOK SYR 18GX1-1/2 18G X 1-1/2" 3 ML MISC Inject 1 Syringe into the skin See admin instructions. 11/21/20   [provider]  B-D 3CC LUER-LOK SYR 22GX1" 22G X 1" 3 ML MISC See admin instructions. 02/28/20   [provider]  Blood Pressure KIT MONITOR BLOOD PRESSURE DAILY DX I10. 04/23/22   Skeet Latch, MD  chlorthalidone (HYGROTON) 25 MG tablet Take 1 tablet (25 mg total) by mouth daily. 07/24/22   Skeet Latch, MD  cholecalciferol (VITAMIN D) 1000 UNITS tablet Take 1,000 Units by  mouth at bedtime.     [provider]  diclofenac Sodium (VOLTAREN) 1 % GEL Apply 2 g topically daily as needed (pain).    [provider]  diphenhydrAMINE (BENADRYL) 25 MG tablet Take 25 mg by mouth every 6 (six) hours as needed for allergies.    [provider]  fluticasone (FLONASE) 50 MCG/ACT nasal spray Place 2 sprays into both nostrils daily as needed for allergies. 01/12/21   [provider]  gabapentin (NEURONTIN) 300 MG capsule Take 900 mg by mouth 3 (three) times daily. 02/07/20   [provider]  lisinopril (ZESTRIL) 20 MG tablet Take 1 tablet (20 mg total) by mouth daily. 07/24/22 07/19/23  Skeet Latch, MD  metFORMIN (GLUCOPHAGE) 1000 MG tablet Take 1,000 mg by mouth 2 (two) times daily with a meal. 07/07/15   [provider]  methocarbamol (ROBAXIN) 500 MG tablet Take 500 mg by mouth 2 (two) times daily as needed for muscle spasms. 07/04/22   [provider]  naloxone [facility]) nasal spray 4 mg/0.1 mL Place 1 spray into the nose daily as needed. 01/08/21   [provider]  oxyCODONE (ROXICODONE) 5 MG immediate release tablet Take 1 tablet (5 mg total) by mouth every 4 (four) hours as needed for severe pain. 07/30/22   Leighton Parody, PA-C  oxyCODONE-acetaminophen (PERCOCET) 10-325 MG tablet Take 1 tablet by mouth every 4 (four) hours as needed for pain. 04/01/22  Leighton Parody, PA-C  oxyCODONE-acetaminophen (PERCOCET) 10-325 MG tablet Take 1 tablet by mouth every 6-12 hours as needed for chronic pain 08/21/22     pantoprazole (PROTONIX) 40 MG tablet Take 40 mg by mouth daily.    [provider]  pravastatin (PRAVACHOL) 20 MG tablet Take 20 mg by mouth at bedtime. 09/15/15   [provider]  Semaglutide, 2 MG/DOSE, (OZEMPIC, 2 MG/DOSE,) 8 MG/3ML SOPN Inject 2 mg into the skin once a week. 04/23/22   Skeet Latch, MD  sildenafil (VIAGRA) 100 MG tablet Take 100 mg by mouth daily as needed for  erectile dysfunction.    [provider]  testosterone cypionate (DEPOTESTOSTERONE CYPIONATE) 200 MG/ML injection Inject 200 mg into the muscle every 28 (twenty-eight) days. Patient not taking: Reported on 07/24/2022 09/21/15   [provider]  tiZANidine (ZANAFLEX) 2 MG tablet Take 1 tablet (2 mg total) by mouth every 6 (six) hours as needed for muscle spasms. 07/30/22   Leighton Parody, PA-C      Allergies    Shellfish allergy, Iodine, and Penicillins    Review of Systems   Review of Systems  Constitutional:  Negative for appetite change and fatigue.  HENT:  Negative for congestion, ear discharge and sinus pressure.   Eyes:  Negative for discharge.  Respiratory:  Positive for shortness of breath. Negative for cough.   Cardiovascular:  Negative for chest pain.  Gastrointestinal:  Negative for abdominal pain and diarrhea.  Genitourinary:  Negative for frequency and hematuria.  Musculoskeletal:  Negative for back pain.  Skin:  Negative for rash.  Neurological:  Negative for seizures and headaches.  Psychiatric/Behavioral:  Negative for hallucinations.     Physical Exam Updated Vital Signs BP 119/68   Pulse 81   Resp 14   Ht _0  (1.854 m)   Wt 123.8 kg   SpO2 100%   BMI 36.02 kg/m  Physical Exam Vitals and nursing note reviewed.  Constitutional:      Appearance: He is well-developed.  HENT:     Head: Normocephalic.     Nose: Nose normal.  Eyes:     General: No scleral icterus.    Conjunctiva/sclera: Conjunctivae normal.  Neck:     Thyroid: No thyromegaly.  Cardiovascular:     Rate and Rhythm: Normal rate and regular rhythm.     Heart sounds: No murmur heard.    No friction rub. No gallop.  Pulmonary:     Breath sounds: No stridor. No wheezing or rales.  Chest:     Chest wall: No tenderness.  Abdominal:     General: There is no distension.     Tenderness: There is no abdominal tenderness. There is no rebound.  Musculoskeletal:        General:  Normal range of motion.     Cervical back: Neck supple.  Lymphadenopathy:     Cervical: No cervical adenopathy.  Skin:    Findings: No erythema or rash.  Neurological:     Mental Status: He is oriented to person, place, and time.     Motor: No abnormal muscle tone.     Coordination: Coordination normal.  Psychiatric:        Behavior: Behavior normal.     ED Results / Procedures / Treatments   Labs (all labs ordered are listed, but only abnormal results are displayed) Labs Reviewed  CBC WITH DIFFERENTIAL/PLATELET  COMPREHENSIVE METABOLIC PANEL  D-DIMER, QUANTITATIVE  TROPONIN I (HIGH SENSITIVITY)    EKG  None  Radiology DG Chest 2 View  Result Date: 08/23/2022 CLINICAL DATA:  Shortness of breath EXAM: CHEST - 2 VIEW COMPARISON:  06/22/20 CXR FINDINGS: Low lung volumes. No pleural effusion. No pneumothorax. Unchanged cardiac and mediastinal contours. No focal airspace opacity. Vertebral body heights are maintained. No displaced rib fractures. Visualized upper abdomen is unremarkable. IMPRESSION: Low lung volumes. No acute cardiopulmonary disease. Electronically Signed   By: Marin Roberts M.D.   On: 08/23/2022 14:04    Procedures Procedures  {Document cardiac monitor, telemetry assessment procedure when appropriate:1}  Medications Ordered in ED Medications - No data to display  ED Course/ Medical Decision Making/ A&P  Patient with chest discomfort and dyspnea on exertion.                         Medical Decision Making Amount and/or Complexity of Data Reviewed Labs: ordered. Radiology: ordered. ECG/medicine tests: ordered.     {Document critical care time when appropriate:1} {Document review of labs and clinical decision tools ie heart score, Chads2Vasc2 etc:1}  {Document your independent review of radiology images, and any outside records:1} {Document your discussion with family members, caretakers, and with consultants:1} {Document social determinants of health  affecting pt's care:1} {Document your decision making why or why not admission, treatments were needed:1} Final Clinical Impression(s) / ED Diagnoses Final diagnoses:  None    Rx / DC Orders ED Discharge Orders     None

## 2022-08-23 NOTE — ED Provider Notes (Signed)
3:29 PM Care assumed from Dr. Roderic Palau.  At time of transfer of care, patient is awaiting for results of CT PE study and delta troponin to rule out concerning etiologies of exertional and pleuritic chest pain and shortness of breath.  Initial troponin elevated at 34, will trend.  Anticipate disposition based on workup results and reassessment.  4:34 PM Patient initially reported to nursing that he was going to refuse a CT scan because of the contrast as he reports it makes "has not hurt".  He reports that he is willing to get some pain medicine with Dilaudid before getting his CT scan and then he will get it.  Will order Dilaudid to be given right for CT scan.  He will still need his delta troponin as well.  Will also give a dose of Benadryl after speaking with CT tech although he does not have a true contrast allergy and there is no documentation that he had any complication with his last treatment with his testicular burning we will give a dose of Benadryl to prevent this as well.   Workup continue to return.  CT scan did not show evidence of acute pulmonary embolism.  Lungs were clear.  Metabolic panel showed mild anemia but no leukocytosis.  Metabolic panel showed AKI compared to prior with a creatinine 1.77 up from prior.  Troponin remained elevated but flat at 32 from 34.  BNP is however elevated at 16 although there is not a previous to compare.  Chart review shows that patient did see a cardiologist last month and was found to have some degree of diastolic dysfunction.  Due to this exertional and pleuritic chest pain and shortness of breath I did call cardiology in consultation and spoke to them.  They felt that it was reasonable to admit the patient to the hospital for echo and reassessment in the morning to determine if he needs a stress test for this chest pain in the setting of elevated BNP, positive troponins, and exertional chest pain.  Patient reports that he does not want to be admitted.   He reports he has an appointment with his cardiologist with Skeet Latch on Tuesday morning and he understands extremely strict return precautions.  I spoke with the patient's family on the phone as well who all agree with this plan of discharge given his improvement in symptoms, well appearance, and stable appearing troponin.  Patient will be discharged with plans to follow-up with cardiology and PCP.  He will also maintain his hydration due to the AKI.  Patient discharged in stable condition.  Clinical Impression: 1. Exertional shortness of breath   2. Exertional chest pain   3. Elevated troponin   4. Elevated brain natriuretic peptide (BNP) level     Disposition: Discharge  Condition: Good  I have discussed the results, Dx and Tx plan with the pt(& family if present). He/she/they expressed understanding and agree(s) with the plan. Discharge instructions discussed at great length. Strict return precautions discussed and pt &/or family have verbalized understanding of the instructions. No further questions at time of discharge.    New Prescriptions   No medications on file    Follow Up: your cardiologist on tuesday        Humaira Sculley, Gwenyth Allegra, MD 08/23/22 2206

## 2022-08-23 NOTE — ED Triage Notes (Signed)
Pt BIB EMS with c/o anterior chest pain. L hip replaced on 07/29/2022. Having a productive cough x2weeks. Ambulatory, pain worsens when coughing and ambulating since Monday. Pt also having muscle  "Feels like someone is sitting on my chest." BP 136/90 HR 88 CBG 136 SpO2 100 RA

## 2022-08-23 NOTE — Discharge Instructions (Signed)
Your history, exam and workup today led [place] to do a large workup to find the cause of your chest pain and shortness of breath.  The CT scan did not show evidence of acute blood clot however your cardiac enzymes and BNP were elevated.  We spoke to cardiology who initially recommended admission however given your improvement in symptoms and well appearance and reassuring vital signs, we felt it was reasonable to have you get discharged and follow-up with your cardiologist in the next several days and observe extremely strict return precautions if any symptoms were to change or worsen.  Your kidney function was also elevated so please maintain hydration at home.  If any symptoms change or worsen, please return to the nearest emergency department.

## 2022-08-23 NOTE — Telephone Encounter (Signed)
Pt arrived at [facility] ED at 1245pm. Will remove from [facility] triage pool as care will be addressed by ED team.   Loel Dubonnet, NP

## 2022-08-27 ENCOUNTER — Other Ambulatory Visit (HOSPITAL_COMMUNITY): Payer: Self-pay

## 2022-08-27 ENCOUNTER — Encounter (HOSPITAL_BASED_OUTPATIENT_CLINIC_OR_DEPARTMENT_OTHER): Payer: Self-pay | Admitting: Family

## 2022-08-27 ENCOUNTER — Ambulatory Visit (HOSPITAL_BASED_OUTPATIENT_CLINIC_OR_DEPARTMENT_OTHER): Payer: Medicaid Other | Admitting: Family

## 2022-08-27 ENCOUNTER — Other Ambulatory Visit (HOSPITAL_BASED_OUTPATIENT_CLINIC_OR_DEPARTMENT_OTHER): Payer: Self-pay

## 2022-08-27 VITALS — BP 120/72 | HR 75 | Ht 73.0 in | Wt 274.0 lb

## 2022-08-27 DIAGNOSIS — E876 Hypokalemia: Secondary | ICD-10-CM | POA: Diagnosis not present

## 2022-08-27 DIAGNOSIS — R7989 Other specified abnormal findings of blood chemistry: Secondary | ICD-10-CM

## 2022-08-27 DIAGNOSIS — I10 Essential (primary) hypertension: Secondary | ICD-10-CM

## 2022-08-27 DIAGNOSIS — R079 Chest pain, unspecified: Secondary | ICD-10-CM

## 2022-08-27 DIAGNOSIS — R0609 Other forms of dyspnea: Secondary | ICD-10-CM | POA: Diagnosis not present

## 2022-08-27 MED ORDER — POTASSIUM CHLORIDE ER 10 MEQ PO TBCR
10.0000 meq | EXTENDED_RELEASE_TABLET | Freq: Two times a day (BID) | ORAL | 0 refills | Status: DC
  Filled 2022-08-27: qty 180, 90d supply, fill #0

## 2022-08-27 MED ORDER — FUROSEMIDE 20 MG PO TABS
20.0000 mg | ORAL_TABLET | Freq: Every day | ORAL | 0 refills | Status: DC
  Filled 2022-08-27: qty 90, 90d supply, fill #0

## 2022-08-27 MED ORDER — OXYCODONE-ACETAMINOPHEN 10-325 MG PO TABS: 1.0000 | ORAL_TABLET | Freq: Four times a day (QID) | ORAL | 0 refills | Status: DC

## 2022-08-27 NOTE — Patient Instructions (Addendum)
Medication Instructions:  Your physician has recommended you make the following change in your medication:   STOP Chlorthalidone  START Furosemide (Lasix) one '20mg'$  tablet daily  CHANGE Potassium to 75mq one tablet twice per day  *If you need a refill on your cardiac medications before your next appointment, please call your pharmacy*   Lab Work: Your physician recommends that you return for lab work in one week for BMP, BNP   Please return for Lab work. You may come to the...   [facility] (3rd floor) [address] [place] [id number] Open: 8am-Noon and 1pm-4:30pm  Please ring the doorbell on the small table when you exit the elevator and the Lab Tech will come get you  [facility] [place] [id number]: 8am-1pm, then 2pm-4:30pm   [place] Please see attached locations sheet stapled to your lab work with address and hours.    If you have labs (blood work) drawn today and your tests are completely normal, you will receive your results only by: [place](if you have MyChart) OR A paper copy in the mail If you have any lab test that is abnormal or we need to change your treatment, we will call you to review the results.   Testing/Procedures: Your physician has requested that you have an echocardiogram. Echocardiography is a painless test that uses sound waves to create images of your heart. It provides your doctor with information about the size and shape of your heart and how well your heart's chambers and valves are working. This procedure takes approximately one hour. There are no restrictions for this procedure. Please do NOT wear cologne, perfume, aftershave, or lotions (deodorant is allowed). Please arrive 15 minutes prior to your appointment time.    Follow-Up: [facility] you and your health needs are our priority.  As part of our continuing mission to  provide you with exceptional heart care, we have created designated Provider Care Teams.  These Care Teams include your primary Cardiologist (physician) and Advanced Practice Providers (APPs -  Physician Assistants and Nurse Practitioners) who all work together to provide you with the care you need, when you need it.  We recommend signing up for the patient portal called "MyChart".  Sign up information is provided on this After Visit Summary.  MyChart is used to connect with patients for Virtual Visits (Telemedicine).  Patients are able to view lab/test results, encounter notes, upcoming appointments, etc.  Non-urgent messages can be sent to your provider as well.   To learn more about what you can do with MyChart, go to [email or web address]    Your next appointment:   After echocardiogram with Dr. ROval Linseyor CLoel Dubonnet NP
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
## 2022-08-29 ENCOUNTER — Other Ambulatory Visit: Payer: Self-pay

## 2022-08-29 ENCOUNTER — Encounter: Payer: Self-pay | Admitting: Physical Therapy

## 2022-08-29 ENCOUNTER — Ambulatory Visit: Payer: Medicaid Other | Attending: Orthopedic Surgery | Admitting: Physical Therapy

## 2022-08-29 DIAGNOSIS — Z96642 Presence of left artificial hip joint: Secondary | ICD-10-CM | POA: Diagnosis present

## 2022-08-29 DIAGNOSIS — R262 Difficulty in walking, not elsewhere classified: Secondary | ICD-10-CM | POA: Insufficient documentation

## 2022-08-29 DIAGNOSIS — M6281 Muscle weakness (generalized): Secondary | ICD-10-CM | POA: Insufficient documentation

## 2022-08-29 DIAGNOSIS — M25552 Pain in left hip: Secondary | ICD-10-CM | POA: Insufficient documentation
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
## 2022-09-05 ENCOUNTER — Ambulatory Visit: Payer: Medicaid Other | Admitting: Physical Therapy

## 2022-09-05 ENCOUNTER — Telehealth (HOSPITAL_BASED_OUTPATIENT_CLINIC_OR_DEPARTMENT_OTHER): Payer: Self-pay

## 2022-09-05 DIAGNOSIS — M6281 Muscle weakness (generalized): Secondary | ICD-10-CM

## 2022-09-05 DIAGNOSIS — M25552 Pain in left hip: Secondary | ICD-10-CM

## 2022-09-05 DIAGNOSIS — Z96642 Presence of left artificial hip joint: Secondary | ICD-10-CM | POA: Diagnosis not present

## 2022-09-05 DIAGNOSIS — R262 Difficulty in walking, not elsewhere classified: Secondary | ICD-10-CM

## 2022-09-05 LAB — BASIC METABOLIC PANEL
BUN/Creatinine Ratio: 15 (ref 10–24)
BUN: 28 mg/dL — ABNORMAL HIGH (ref 8–27)
CO2: 25 mmol/L (ref 20–29)
Calcium: 9.1 mg/dL (ref 8.6–10.2)
Chloride: 102 mmol/L (ref 96–106)
Creatinine, Ser: 1.9 mg/dL — ABNORMAL HIGH (ref 0.76–1.27)
Glucose: 73 mg/dL (ref 70–99)
Potassium: 4.2 mmol/L (ref 3.5–5.2)
Sodium: 142 mmol/L (ref 134–144)
eGFR: 40 mL/min/{1.73_m2} — ABNORMAL LOW (ref >59)

## 2022-09-05 LAB — BRAIN NATRIURETIC PEPTIDE: BNP: 8.8 pg/mL (ref 0.0–100.0)
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
## 2022-09-06 ENCOUNTER — Other Ambulatory Visit (HOSPITAL_COMMUNITY): Payer: Self-pay

## 2022-09-11 ENCOUNTER — Telehealth (HOSPITAL_BASED_OUTPATIENT_CLINIC_OR_DEPARTMENT_OTHER): Payer: Self-pay

## 2022-09-11 DIAGNOSIS — R0609 Other forms of dyspnea: Secondary | ICD-10-CM

## 2022-09-11 NOTE — Telephone Encounter (Addendum)
Seen by patient Miguel Brady on 09/08/2022 10:58 PM; orders placed, follow up mychart message sent to patient   ----- Message from Loel Dubonnet, NP sent at 09/05/2022  4:48 PM EST ----- BNP shows volume overload has resolved. Normal electrolytes. Kidney function decreased from previous. Stop furosemide and potassium. Recommend weighing daily and reporting weight gain of 2 pounds overnight or 5 pounds in one week. Repeat BMP in 1 week.

## 2022-09-18 ENCOUNTER — Other Ambulatory Visit (HOSPITAL_BASED_OUTPATIENT_CLINIC_OR_DEPARTMENT_OTHER): Payer: Self-pay | Admitting: Cardiovascular Disease

## 2022-09-18 ENCOUNTER — Other Ambulatory Visit (HOSPITAL_BASED_OUTPATIENT_CLINIC_OR_DEPARTMENT_OTHER): Payer: Self-pay

## 2022-09-18 ENCOUNTER — Ambulatory Visit (INDEPENDENT_AMBULATORY_CARE_PROVIDER_SITE_OTHER): Payer: Medicaid Other

## 2022-09-18 ENCOUNTER — Telehealth (HOSPITAL_BASED_OUTPATIENT_CLINIC_OR_DEPARTMENT_OTHER): Payer: Self-pay | Admitting: Cardiovascular Disease

## 2022-09-18 ENCOUNTER — Ambulatory Visit: Payer: Medicaid Other | Attending: Orthopedic Surgery

## 2022-09-18 DIAGNOSIS — R262 Difficulty in walking, not elsewhere classified: Secondary | ICD-10-CM

## 2022-09-18 DIAGNOSIS — M25552 Pain in left hip: Secondary | ICD-10-CM | POA: Diagnosis present

## 2022-09-18 DIAGNOSIS — M6281 Muscle weakness (generalized): Secondary | ICD-10-CM

## 2022-09-18 DIAGNOSIS — R7989 Other specified abnormal findings of blood chemistry: Secondary | ICD-10-CM | POA: Diagnosis not present

## 2022-09-18 DIAGNOSIS — R0609 Other forms of dyspnea: Secondary | ICD-10-CM

## 2022-09-18 LAB — ECHOCARDIOGRAM COMPLETE
Area-P 1/2: 3.77 cm2
S' Lateral: 2.9 cm

## 2022-09-18 MED ORDER — OZEMPIC (2 MG/DOSE) 8 MG/3ML ~~LOC~~ SOPN
2.0000 mg | PEN_INJECTOR | SUBCUTANEOUS | 3 refills | Status: DC
  Filled 2022-09-18: qty 9, 84d supply, fill #0
  Filled 2022-11-20: qty 9, 84d supply, fill #1

## 2022-09-18 NOTE — Telephone Encounter (Signed)
*  STAT* If patient is at the pharmacy, call can be transferred to refill team.   1. Which medications need to be refilled? (please list name of each medication and dose if known) Ozempic '2mg'$   2. Which pharmacy/location (including street and city if local pharmacy) is medication to be sent to?[facility]   3. Do they need a 30 day or 90 day supply? 90 is preferred.

## 2022-09-18 NOTE — Telephone Encounter (Signed)
Refilled as requested  

## 2022-09-18 NOTE — Telephone Encounter (Signed)
Please review for refill. Thank you! 
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
## 2022-09-20 ENCOUNTER — Other Ambulatory Visit (HOSPITAL_COMMUNITY): Payer: Self-pay

## 2022-09-21 ENCOUNTER — Other Ambulatory Visit (HOSPITAL_COMMUNITY): Payer: Self-pay

## 2022-09-23 ENCOUNTER — Other Ambulatory Visit (HOSPITAL_COMMUNITY): Payer: Self-pay

## 2022-09-23 ENCOUNTER — Ambulatory Visit: Payer: Medicaid Other

## 2022-09-23 DIAGNOSIS — M6281 Muscle weakness (generalized): Secondary | ICD-10-CM | POA: Diagnosis not present

## 2022-09-23 DIAGNOSIS — M25552 Pain in left hip: Secondary | ICD-10-CM

## 2022-09-23 DIAGNOSIS — R262 Difficulty in walking, not elsewhere classified: Secondary | ICD-10-CM

## 2022-09-23 MED ORDER — OXYCODONE-ACETAMINOPHEN 10-325 MG PO TABS
1.0000 | ORAL_TABLET | Freq: Four times a day (QID) | ORAL | 0 refills | Status: DC | PRN
  Filled 2022-09-23: qty 120, 30d supply, fill #0

## 2022-09-23 MED ORDER — PANTOPRAZOLE SODIUM 40 MG PO TBEC
40.0000 mg | DELAYED_RELEASE_TABLET | Freq: Two times a day (BID) | ORAL | 3 refills | Status: DC
  Filled 2022-09-23 – 2022-09-24 (×4): qty 60, 30d supply, fill #0
  Filled 2022-10-19: qty 60, 30d supply, fill #1
  Filled 2022-11-20: qty 60, 30d supply, fill #2
  Filled 2022-12-23: qty 60, 30d supply, fill #3
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
## 2022-09-24 ENCOUNTER — Other Ambulatory Visit (HOSPITAL_COMMUNITY): Payer: Self-pay

## 2022-09-25 ENCOUNTER — Other Ambulatory Visit (HOSPITAL_BASED_OUTPATIENT_CLINIC_OR_DEPARTMENT_OTHER): Payer: Self-pay

## 2022-09-25 ENCOUNTER — Ambulatory Visit: Payer: Medicaid Other | Admitting: Physical Therapy

## 2022-09-25 DIAGNOSIS — M25552 Pain in left hip: Secondary | ICD-10-CM

## 2022-09-25 DIAGNOSIS — M6281 Muscle weakness (generalized): Secondary | ICD-10-CM | POA: Diagnosis not present

## 2022-09-25 DIAGNOSIS — R262 Difficulty in walking, not elsewhere classified: Secondary | ICD-10-CM
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
## 2022-09-26 ENCOUNTER — Other Ambulatory Visit (HOSPITAL_COMMUNITY): Payer: Self-pay

## 2022-09-27 ENCOUNTER — Ambulatory Visit (HOSPITAL_BASED_OUTPATIENT_CLINIC_OR_DEPARTMENT_OTHER): Payer: Medicaid Other | Admitting: Family

## 2022-09-27 ENCOUNTER — Encounter (HOSPITAL_BASED_OUTPATIENT_CLINIC_OR_DEPARTMENT_OTHER): Payer: Self-pay | Admitting: Family

## 2022-09-27 ENCOUNTER — Other Ambulatory Visit (HOSPITAL_BASED_OUTPATIENT_CLINIC_OR_DEPARTMENT_OTHER): Payer: Self-pay

## 2022-09-27 VITALS — BP 132/80 | HR 85 | Ht 73.0 in | Wt 290.0 lb

## 2022-09-27 DIAGNOSIS — I10 Essential (primary) hypertension: Secondary | ICD-10-CM

## 2022-09-27 DIAGNOSIS — R0609 Other forms of dyspnea: Secondary | ICD-10-CM

## 2022-09-27 DIAGNOSIS — R052 Subacute cough: Secondary | ICD-10-CM

## 2022-09-27 MED ORDER — FUROSEMIDE 20 MG PO TABS
20.0000 mg | ORAL_TABLET | Freq: Every day | ORAL | 3 refills | Status: DC
  Filled 2022-09-27 – 2022-11-20 (×2): qty 90, 90d supply, fill #0

## 2022-09-27 MED ORDER — GUAIFENESIN ER 600 MG PO TB12
600.0000 mg | ORAL_TABLET | Freq: Two times a day (BID) | ORAL | 2 refills | Status: DC
  Filled 2022-09-27: qty 60, 30d supply, fill #0
  Filled 2022-11-12: qty 20, 10d supply, fill #0

## 2022-09-27 MED ORDER — POTASSIUM CHLORIDE ER 10 MEQ PO TBCR
10.0000 meq | EXTENDED_RELEASE_TABLET | Freq: Two times a day (BID) | ORAL | 3 refills | Status: DC
  Filled 2022-09-27 – 2022-11-20 (×2): qty 180, 90d supply, fill #0
  Filled 2023-02-19: qty 180, 90d supply, fill #1
  Filled 2023-05-28: qty 180, 90d supply, fill #2
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
## 2022-09-27 NOTE — Patient Instructions (Addendum)
Medication Instructions:  Your physician has recommended you make the following change in your medication:    Recommend Guafenesin (Muccinex) as needed for chest congestion. Try to avoid Muccinex-DM as it can make your blood pressure higher.   CONTINUE Furosemide one '20mg'$  tablet daily  CONTINUE  Potassium 36mq twice daily  Remain OFF Spironolactone, Chlorthalidone  *If you need a refill on your cardiac medications before your next appointment, please call your pharmacy*   Lab Work/Testing/Procedures: Your echocardiogram showed normal heart muscle function and normal heart valves which is great!  Follow-Up: [facility] are our [facility].  As part of our continuing mission to provide you with exceptional heart care, we have created designated Provider Care Teams.  These Care Teams include your primary Cardiologist (physician) and Advanced Practice Providers (APPs -  Physician Assistants and Nurse Practitioners) who all work together to provide you with the care you need, when you need it.  We recommend signing up for the patient portal called "MyChart".  Sign up information is provided on this After Visit Summary.  MyChart is used to connect with patients for Virtual Visits (Telemedicine).  Patients are able to view lab/test results, encounter notes, upcoming appointments, etc.  Non-urgent messages can be sent to your provider as well.   To learn more about what you can do with MyChart, go to [email or web address]    Your next appointment:   May 1st at 11:20 AM with Dr. ROval Linsey  Other Instructions  To prevent or reduce lower extremity swelling: Eat a low salt diet. Salt makes the body hold onto extra fluid which causes swelling. Sit with legs elevated. For example, in the recliner or on an [place]  Wear knee-high compression stockings during the daytime. Ones labeled 15-20 mmHg provide good compression.  Heart Healthy Diet  Recommendations: A low-salt diet is recommended. Meats should be grilled, baked, or boiled. Avoid fried foods. Focus on lean protein sources like fish or chicken with vegetables and fruits. The American Heart Association is a GMicrobiologist  American Heart Association Diet and Lifeystyle Recommendations   Exercise recommendations: The American Heart Association recommends 150 minutes of moderate intensity exercise weekly. Try 30 minutes of moderate intensity exercise 4-5 times per week. This could include walking, jogging, or swimming.

## 2022-10-01 ENCOUNTER — Ambulatory Visit: Payer: Medicaid Other

## 2022-10-01 DIAGNOSIS — M6281 Muscle weakness (generalized): Secondary | ICD-10-CM | POA: Diagnosis not present

## 2022-10-01 DIAGNOSIS — M25552 Pain in left hip: Secondary | ICD-10-CM

## 2022-10-01 DIAGNOSIS — R262 Difficulty in walking, not elsewhere classified: Secondary | ICD-10-CM
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
## 2022-10-03 ENCOUNTER — Ambulatory Visit: Payer: Medicaid Other | Admitting: Physical Therapy

## 2022-10-03 DIAGNOSIS — R262 Difficulty in walking, not elsewhere classified: Secondary | ICD-10-CM

## 2022-10-03 DIAGNOSIS — M6281 Muscle weakness (generalized): Secondary | ICD-10-CM

## 2022-10-03 DIAGNOSIS — M25552 Pain in left hip: Secondary | ICD-10-CM
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
## 2022-10-08 ENCOUNTER — Ambulatory Visit: Payer: Medicaid Other

## 2022-10-08 DIAGNOSIS — M6281 Muscle weakness (generalized): Secondary | ICD-10-CM

## 2022-10-08 DIAGNOSIS — R262 Difficulty in walking, not elsewhere classified: Secondary | ICD-10-CM

## 2022-10-08 DIAGNOSIS — M25552 Pain in left hip: Secondary | ICD-10-CM
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
## 2022-10-09 ENCOUNTER — Other Ambulatory Visit (HOSPITAL_COMMUNITY): Payer: Self-pay

## 2022-10-09 MED ORDER — GABAPENTIN 300 MG PO CAPS
900.0000 mg | ORAL_CAPSULE | Freq: Three times a day (TID) | ORAL | 0 refills | Status: DC
  Filled 2022-11-05: qty 540, 60d supply, fill #0

## 2022-10-09 MED ORDER — OXYCODONE-ACETAMINOPHEN 10-325 MG PO TABS
ORAL_TABLET | ORAL | 0 refills | Status: DC
  Filled 2022-10-24: qty 120, 30d supply, fill #0

## 2022-10-09 MED ORDER — OXYCODONE-ACETAMINOPHEN 10-325 MG PO TABS
1.0000 | ORAL_TABLET | Freq: Four times a day (QID) | ORAL | 0 refills | Status: DC | PRN
  Filled 2022-11-23: qty 120, 30d supply, fill #0

## 2022-10-10 ENCOUNTER — Other Ambulatory Visit (HOSPITAL_COMMUNITY): Payer: Self-pay

## 2022-10-10 ENCOUNTER — Ambulatory Visit: Payer: Medicaid Other | Admitting: Physical Therapy

## 2022-10-10 DIAGNOSIS — R262 Difficulty in walking, not elsewhere classified: Secondary | ICD-10-CM

## 2022-10-10 DIAGNOSIS — M25552 Pain in left hip: Secondary | ICD-10-CM

## 2022-10-10 DIAGNOSIS — M6281 Muscle weakness (generalized): Secondary | ICD-10-CM | POA: Diagnosis not present
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
## 2022-10-11 ENCOUNTER — Other Ambulatory Visit (HOSPITAL_COMMUNITY): Payer: Self-pay

## 2022-10-11 MED ORDER — GABAPENTIN 300 MG PO CAPS
900.0000 mg | ORAL_CAPSULE | Freq: Three times a day (TID) | ORAL | 0 refills | Status: DC
  Filled 2022-10-11: qty 270, 30d supply, fill #0

## 2022-10-14 ENCOUNTER — Ambulatory Visit: Payer: Medicaid Other

## 2022-10-14 DIAGNOSIS — M25552 Pain in left hip: Secondary | ICD-10-CM

## 2022-10-14 DIAGNOSIS — R262 Difficulty in walking, not elsewhere classified: Secondary | ICD-10-CM

## 2022-10-14 DIAGNOSIS — M6281 Muscle weakness (generalized): Secondary | ICD-10-CM
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
## 2022-10-17 ENCOUNTER — Ambulatory Visit: Payer: Medicaid Other | Attending: Orthopedic Surgery | Admitting: Physical Therapy

## 2022-10-17 DIAGNOSIS — R252 Cramp and spasm: Secondary | ICD-10-CM | POA: Diagnosis present

## 2022-10-17 DIAGNOSIS — M25552 Pain in left hip: Secondary | ICD-10-CM | POA: Diagnosis present

## 2022-10-17 DIAGNOSIS — R262 Difficulty in walking, not elsewhere classified: Secondary | ICD-10-CM

## 2022-10-17 DIAGNOSIS — M6281 Muscle weakness (generalized): Secondary | ICD-10-CM
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
## 2022-10-21 ENCOUNTER — Ambulatory Visit: Payer: Medicaid Other

## 2022-10-21 DIAGNOSIS — M6281 Muscle weakness (generalized): Secondary | ICD-10-CM

## 2022-10-21 DIAGNOSIS — M25552 Pain in left hip: Secondary | ICD-10-CM

## 2022-10-21 DIAGNOSIS — R262 Difficulty in walking, not elsewhere classified: Secondary | ICD-10-CM
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
## 2022-10-24 ENCOUNTER — Other Ambulatory Visit (HOSPITAL_COMMUNITY): Payer: Self-pay

## 2022-10-28 ENCOUNTER — Encounter: Payer: Self-pay | Admitting: Physical Therapy

## 2022-10-28 ENCOUNTER — Ambulatory Visit: Payer: Medicaid Other | Admitting: Physical Therapy

## 2022-10-28 DIAGNOSIS — R262 Difficulty in walking, not elsewhere classified: Secondary | ICD-10-CM

## 2022-10-28 DIAGNOSIS — M6281 Muscle weakness (generalized): Secondary | ICD-10-CM | POA: Diagnosis not present

## 2022-10-28 DIAGNOSIS — M25552 Pain in left hip: Secondary | ICD-10-CM
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
## 2022-10-30 ENCOUNTER — Ambulatory Visit: Payer: Medicaid Other

## 2022-10-30 DIAGNOSIS — M25552 Pain in left hip: Secondary | ICD-10-CM

## 2022-10-30 DIAGNOSIS — M6281 Muscle weakness (generalized): Secondary | ICD-10-CM

## 2022-10-30 DIAGNOSIS — R262 Difficulty in walking, not elsewhere classified: Secondary | ICD-10-CM

## 2022-10-30 DIAGNOSIS — R252 Cramp and spasm: Secondary | ICD-10-CM
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
## 2022-11-04 ENCOUNTER — Ambulatory Visit: Payer: Medicaid Other

## 2022-11-04 DIAGNOSIS — M6281 Muscle weakness (generalized): Secondary | ICD-10-CM

## 2022-11-04 DIAGNOSIS — R252 Cramp and spasm: Secondary | ICD-10-CM

## 2022-11-04 DIAGNOSIS — R262 Difficulty in walking, not elsewhere classified: Secondary | ICD-10-CM

## 2022-11-04 DIAGNOSIS — M25552 Pain in left hip: Secondary | ICD-10-CM
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
## 2022-11-05 ENCOUNTER — Other Ambulatory Visit (HOSPITAL_COMMUNITY): Payer: Self-pay

## 2022-11-12 ENCOUNTER — Other Ambulatory Visit (HOSPITAL_BASED_OUTPATIENT_CLINIC_OR_DEPARTMENT_OTHER): Payer: Self-pay

## 2022-11-13 ENCOUNTER — Encounter: Payer: Medicaid Other | Admitting: Physical Therapy

## 2022-11-19 ENCOUNTER — Other Ambulatory Visit (HOSPITAL_BASED_OUTPATIENT_CLINIC_OR_DEPARTMENT_OTHER): Payer: Self-pay

## 2022-11-20 ENCOUNTER — Other Ambulatory Visit: Payer: Self-pay

## 2022-11-20 ENCOUNTER — Encounter: Payer: Medicaid Other | Admitting: Physical Therapy

## 2022-11-20 ENCOUNTER — Other Ambulatory Visit (HOSPITAL_COMMUNITY): Payer: Self-pay

## 2022-11-20 ENCOUNTER — Other Ambulatory Visit (HOSPITAL_BASED_OUTPATIENT_CLINIC_OR_DEPARTMENT_OTHER): Payer: Self-pay

## 2022-11-20 ENCOUNTER — Other Ambulatory Visit (HOSPITAL_BASED_OUTPATIENT_CLINIC_OR_DEPARTMENT_OTHER): Payer: Self-pay | Admitting: Cardiovascular Disease

## 2022-11-20 MED ORDER — LISINOPRIL 20 MG PO TABS
20.0000 mg | ORAL_TABLET | Freq: Every day | ORAL | 1 refills | Status: DC
  Filled 2022-11-20: qty 90, 90d supply, fill #0
  Filled 2023-02-19: qty 90, 90d supply, fill #1

## 2022-11-20 NOTE — Telephone Encounter (Signed)
Rx request sent to pharmacy.  

## 2022-11-22 ENCOUNTER — Other Ambulatory Visit (HOSPITAL_BASED_OUTPATIENT_CLINIC_OR_DEPARTMENT_OTHER): Payer: Self-pay

## 2022-11-23 ENCOUNTER — Other Ambulatory Visit (HOSPITAL_COMMUNITY): Payer: Self-pay

## 2022-11-23 ENCOUNTER — Other Ambulatory Visit: Payer: Self-pay | Admitting: Acute Care

## 2022-11-23 DIAGNOSIS — Z87891 Personal history of nicotine dependence: Secondary | ICD-10-CM

## 2022-11-23 DIAGNOSIS — F17210 Nicotine dependence, cigarettes, uncomplicated: Secondary | ICD-10-CM

## 2022-11-23 DIAGNOSIS — Z122 Encounter for screening for malignant neoplasm of respiratory organs: Secondary | ICD-10-CM

## 2022-11-30 ENCOUNTER — Other Ambulatory Visit (HOSPITAL_COMMUNITY): Payer: Self-pay

## 2022-12-02 ENCOUNTER — Other Ambulatory Visit (HOSPITAL_BASED_OUTPATIENT_CLINIC_OR_DEPARTMENT_OTHER): Payer: Self-pay

## 2022-12-02 ENCOUNTER — Other Ambulatory Visit (HOSPITAL_COMMUNITY): Payer: Self-pay

## 2022-12-02 MED ORDER — METFORMIN HCL 1000 MG PO TABS
1000.0000 mg | ORAL_TABLET | Freq: Two times a day (BID) | ORAL | 3 refills | Status: DC
  Filled 2022-12-02: qty 180, 90d supply, fill #0
  Filled 2023-02-26: qty 180, 90d supply, fill #1
  Filled 2023-05-28: qty 180, 90d supply, fill #2
  Filled 2023-09-18: qty 180, 90d supply, fill #3

## 2022-12-03 ENCOUNTER — Other Ambulatory Visit (HOSPITAL_COMMUNITY): Payer: Self-pay

## 2022-12-05 ENCOUNTER — Other Ambulatory Visit (HOSPITAL_COMMUNITY): Payer: Self-pay

## 2022-12-05 MED ORDER — OXYCODONE-ACETAMINOPHEN 10-325 MG PO TABS
1.0000 | ORAL_TABLET | Freq: Four times a day (QID) | ORAL | 0 refills | Status: DC | PRN
  Filled 2022-12-23: qty 120, 30d supply, fill #0

## 2022-12-05 MED ORDER — GABAPENTIN 300 MG PO CAPS: 900.0000 mg | ORAL_CAPSULE | Freq: Three times a day (TID) | ORAL | 0 refills | Status: DC

## 2022-12-05 MED ORDER — OXYCODONE-ACETAMINOPHEN 10-325 MG PO TABS
1.0000 | ORAL_TABLET | Freq: Four times a day (QID) | ORAL | 0 refills | Status: DC | PRN
  Filled 2023-01-22: qty 120, 30d supply, fill #0

## 2022-12-18 ENCOUNTER — Other Ambulatory Visit (HOSPITAL_COMMUNITY): Payer: Self-pay

## 2022-12-19 ENCOUNTER — Other Ambulatory Visit (HOSPITAL_COMMUNITY): Payer: Self-pay

## 2022-12-19 MED ORDER — METHOCARBAMOL 500 MG PO TABS
500.0000 mg | ORAL_TABLET | Freq: Two times a day (BID) | ORAL | 1 refills | Status: DC | PRN
  Filled 2022-12-19: qty 60, 30d supply, fill #0
  Filled 2023-01-22: qty 60, 30d supply, fill #1
  Filled 2023-03-24: qty 60, 30d supply, fill #2
  Filled 2023-05-01: qty 60, 30d supply, fill #3
  Filled 2023-05-28: qty 60, 30d supply, fill #4
  Filled 2023-07-11: qty 60, 30d supply, fill #5

## 2022-12-23 ENCOUNTER — Other Ambulatory Visit (HOSPITAL_COMMUNITY): Payer: Self-pay

## 2022-12-28 ENCOUNTER — Other Ambulatory Visit (HOSPITAL_COMMUNITY): Payer: Self-pay

## 2022-12-30 ENCOUNTER — Other Ambulatory Visit (HOSPITAL_COMMUNITY): Payer: Self-pay

## 2022-12-30 MED ORDER — GABAPENTIN 300 MG PO CAPS
900.0000 mg | ORAL_CAPSULE | Freq: Three times a day (TID) | ORAL | 5 refills | Status: DC
  Filled 2022-12-30: qty 270, 30d supply, fill #0

## 2022-12-31 ENCOUNTER — Other Ambulatory Visit (HOSPITAL_COMMUNITY): Payer: Self-pay

## 2023-01-01 ENCOUNTER — Other Ambulatory Visit (HOSPITAL_COMMUNITY): Payer: Self-pay

## 2023-01-01 MED ORDER — TESTOSTERONE CYPIONATE 200 MG/ML IM SOLN
100.0000 mg | INTRAMUSCULAR | 1 refills | Status: AC
  Filled 2023-01-01: qty 1, 28d supply, fill #0
  Filled 2023-01-27: qty 1, 28d supply, fill #1
  Filled 2023-02-26: qty 1, 28d supply, fill #2
  Filled 2023-03-29 (×2): qty 1, 28d supply, fill #3
  Filled 2023-05-19: qty 1, 28d supply, fill #4

## 2023-01-01 MED ORDER — GABAPENTIN 300 MG PO CAPS
900.0000 mg | ORAL_CAPSULE | Freq: Three times a day (TID) | ORAL | 1 refills | Status: DC
  Filled 2023-01-01: qty 810, 90d supply, fill #0

## 2023-01-08 ENCOUNTER — Other Ambulatory Visit (HOSPITAL_COMMUNITY): Payer: Self-pay

## 2023-01-13 ENCOUNTER — Ambulatory Visit
Admission: RE | Admit: 2023-01-13 | Discharge: 2023-01-13 | Disposition: A | Discharge status: Home / Self Care | Payer: Medicaid Other | Source: Ambulatory Visit | Attending: Family Medicine | Admitting: Family Medicine

## 2023-01-13 DIAGNOSIS — Z87891 Personal history of nicotine dependence: Secondary | ICD-10-CM

## 2023-01-13 DIAGNOSIS — F17210 Nicotine dependence, cigarettes, uncomplicated: Secondary | ICD-10-CM

## 2023-01-13 DIAGNOSIS — Z122 Encounter for screening for malignant neoplasm of respiratory organs: Secondary | ICD-10-CM

## 2023-01-15 ENCOUNTER — Telehealth: Payer: Self-pay

## 2023-01-15 ENCOUNTER — Encounter (HOSPITAL_BASED_OUTPATIENT_CLINIC_OR_DEPARTMENT_OTHER): Payer: Self-pay | Admitting: Cardiovascular Disease

## 2023-01-15 ENCOUNTER — Encounter (HOSPITAL_BASED_OUTPATIENT_CLINIC_OR_DEPARTMENT_OTHER): Payer: Self-pay | Admitting: *Deleted

## 2023-01-15 ENCOUNTER — Other Ambulatory Visit (HOSPITAL_BASED_OUTPATIENT_CLINIC_OR_DEPARTMENT_OTHER): Payer: Self-pay

## 2023-01-15 ENCOUNTER — Other Ambulatory Visit (HOSPITAL_COMMUNITY): Payer: Self-pay

## 2023-01-15 ENCOUNTER — Ambulatory Visit (HOSPITAL_BASED_OUTPATIENT_CLINIC_OR_DEPARTMENT_OTHER): Payer: Medicaid Other | Admitting: Cardiovascular Disease

## 2023-01-15 VITALS — BP 157/99 | HR 80 | Ht 73.0 in | Wt 292.3 lb

## 2023-01-15 DIAGNOSIS — E119 Type 2 diabetes mellitus without complications: Secondary | ICD-10-CM | POA: Diagnosis not present

## 2023-01-15 DIAGNOSIS — I10 Essential (primary) hypertension: Secondary | ICD-10-CM | POA: Diagnosis not present

## 2023-01-15 DIAGNOSIS — M79606 Pain in leg, unspecified: Secondary | ICD-10-CM

## 2023-01-15 DIAGNOSIS — Z5181 Encounter for therapeutic drug level monitoring: Secondary | ICD-10-CM

## 2023-01-15 DIAGNOSIS — M7989 Other specified soft tissue disorders: Secondary | ICD-10-CM

## 2023-01-15 DIAGNOSIS — M1612 Unilateral primary osteoarthritis, left hip: Secondary | ICD-10-CM

## 2023-01-15 DIAGNOSIS — R600 Localized edema: Secondary | ICD-10-CM | POA: Insufficient documentation

## 2023-01-15 HISTORY — DX: Localized edema: R60.0

## 2023-01-15 LAB — BASIC METABOLIC PANEL
BUN/Creatinine Ratio: 8 — ABNORMAL LOW (ref 10–24)
BUN: 9 mg/dL (ref 8–27)
CO2: 23 mmol/L (ref 20–29)
Calcium: 8.8 mg/dL (ref 8.6–10.2)
Chloride: 101 mmol/L (ref 96–106)
Creatinine, Ser: 1.2 mg/dL (ref 0.76–1.27)
Glucose: 98 mg/dL (ref 70–99)
Potassium: 4.4 mmol/L (ref 3.5–5.2)
Sodium: 138 mmol/L (ref 134–144)
eGFR: 69 mL/min/{1.73_m2} (ref >59)

## 2023-01-15 MED ORDER — MOUNJARO 10 MG/0.5ML ~~LOC~~ SOAJ
10.0000 mg | SUBCUTANEOUS | 1 refills | Status: DC
  Filled 2023-01-15: qty 2, 28d supply, fill #0

## 2023-01-15 MED ORDER — CHLORTHALIDONE 25 MG PO TABS
25.0000 mg | ORAL_TABLET | Freq: Every day | ORAL | 3 refills | Status: DC
  Filled 2023-01-15: qty 90, 90d supply, fill #0
  Filled 2023-04-18: qty 90, 90d supply, fill #1
  Filled 2023-07-11: qty 90, 90d supply, fill #2
  Filled 2023-10-21: qty 90, 90d supply, fill #3

## 2023-01-15 NOTE — Assessment & Plan Note (Signed)
He has L>R LE edema.  Will check LE venous Dopplers.  Recommend compression socks.  He was given information on the [employer].

## 2023-01-15 NOTE — Assessment & Plan Note (Addendum)
Blood pressure is uncontrolled again.  He was on chlorthalidone, amloidpine, carvedilol and lisinopril.  These mediation swere reduced and discontinued other than lisinopril.  Recommend resuming chlorthalidone 25mg  daily.  Check BMP now and in one week. Track BP at home and bring to follow up in 6 weeks.  BP goal <130/80.  He reports migraine and daily headache that is likely due to uncontrolled HTN.  Stop lasix.  He was only taking it rarely given increased urination but now has mild volume overload on exam.

## 2023-01-15 NOTE — Telephone Encounter (Signed)
Pharmacy Patient Advocate Encounter  Prior Authorization for [facility] has been approved.    PA# [id number] Effective dates: 01/15/23 through 01/15/24     Received notification from [employer] that prior authorization for [facility] is needed.    PA submitted on 01/15/23 Key [id number] Status is pending  Haze Rushing, CPhT Pharmacy Patient Advocate Specialist Direct Number: [phone] Fax: [phone]
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
## 2023-01-15 NOTE — Patient Instructions (Addendum)
Medication Instructions:  START CHLORTHALIDONE 25 MG DAILY   STOP OZEMPIC  START MOUNJARO 10 MG WEEKLY   *If you need a refill on your cardiac medications before your next appointment, please call your pharmacy*  Lab Work: BMET TODAY   BMET 1 WEEK AFTER STARTING CHLORTHALIDONE  If you have labs (blood work) drawn today and your tests are completely normal, you will receive your results only by: MyChart Message (if you have MyChart) OR A paper copy in the mail If you have any lab test that is abnormal or we need to change your treatment, we will call you to review the results.  Testing/Procedures: Your physician has requested that you have a lower extremity venous duplex. This test is an ultrasound of the veins in the legs or arms. It looks at venous blood flow that carries blood from the heart to the legs or arms. Allow one hour for a Lower Venous exam. Allow thirty minutes for an Upper Venous exam. There are no restrictions or special instructions.  Follow-Up: At [facility], you and your health needs are our priority.  As part of our continuing mission to provide you with exceptional heart care, we have created designated Provider Care Teams.  These Care Teams include your primary Cardiologist (physician) and Advanced Practice Providers (APPs -  Physician Assistants and Nurse Practitioners) who all work together to provide you with the care you need, when you need it.  We recommend signing up for the patient portal called "MyChart".  Sign up information is provided on this After Visit Summary.  MyChart is used to connect with patients for Virtual Visits (Telemedicine).  Patients are able to view lab/test results, encounter notes, upcoming appointments, etc.  Non-urgent messages can be sent to your provider as well.   To learn more about what you can do with MyChart, go to [email or web address].    Your next appointment:   1-2 month(s)  Provider:   Ronn Melena OR PHARM D    6 MONTHS WITH DR [facility]   Other Instructions  WEAR COMPRESSION SOCKS DURING THE DAY, MEDIUM STRENGTH

## 2023-01-15 NOTE — Telephone Encounter (Signed)
Advised patient, verbalized understanding  

## 2023-01-15 NOTE — Telephone Encounter (Signed)
Patient started on [facility] today, will forward to prior auth department   Discontinuing Ozempic

## 2023-01-15 NOTE — Assessment & Plan Note (Signed)
He is frustrated that he is gaining weight again and doesn't think that Ozempic is working.  We will try Mounjaro 10mg  instead.

## 2023-01-15 NOTE — Assessment & Plan Note (Signed)
S/p L hip replacement.  OK to use diclofenac for pain.

## 2023-01-16 ENCOUNTER — Other Ambulatory Visit: Payer: Self-pay | Admitting: Acute Care

## 2023-01-16 ENCOUNTER — Other Ambulatory Visit: Payer: Self-pay

## 2023-01-16 DIAGNOSIS — Z122 Encounter for screening for malignant neoplasm of respiratory organs: Secondary | ICD-10-CM

## 2023-01-16 DIAGNOSIS — Z87891 Personal history of nicotine dependence: Secondary | ICD-10-CM

## 2023-01-20 NOTE — Telephone Encounter (Signed)
This encounter was created in error - please disregard.

## 2023-01-21 ENCOUNTER — Ambulatory Visit (HOSPITAL_COMMUNITY)
Admission: RE | Admit: 2023-01-21 | Discharge: 2023-01-21 | Disposition: A | Discharge status: Home / Self Care | Payer: Medicaid Other | Source: Ambulatory Visit | Attending: Cardiology | Admitting: Cardiology

## 2023-01-21 DIAGNOSIS — M79606 Pain in leg, unspecified: Secondary | ICD-10-CM | POA: Insufficient documentation

## 2023-01-21 DIAGNOSIS — M7989 Other specified soft tissue disorders: Secondary | ICD-10-CM | POA: Diagnosis not present

## 2023-01-22 ENCOUNTER — Other Ambulatory Visit (HOSPITAL_COMMUNITY): Payer: Self-pay

## 2023-01-23 ENCOUNTER — Other Ambulatory Visit (HOSPITAL_COMMUNITY): Payer: Self-pay

## 2023-01-23 MED ORDER — AMOXICILLIN-POT CLAVULANATE 875-125 MG PO TABS
ORAL_TABLET | ORAL | 0 refills | Status: DC
  Filled 2023-01-23: qty 20, 10d supply, fill #0

## 2023-01-27 ENCOUNTER — Other Ambulatory Visit (HOSPITAL_COMMUNITY): Payer: Self-pay

## 2023-01-29 ENCOUNTER — Other Ambulatory Visit: Payer: Self-pay

## 2023-01-29 ENCOUNTER — Other Ambulatory Visit (HOSPITAL_COMMUNITY): Payer: Self-pay

## 2023-01-29 MED ORDER — OXYCODONE-ACETAMINOPHEN 10-325 MG PO TABS
1.0000 | ORAL_TABLET | Freq: Four times a day (QID) | ORAL | 0 refills | Status: DC | PRN
  Filled 2023-02-21: qty 120, 30d supply, fill #0

## 2023-01-29 MED ORDER — GABAPENTIN 300 MG PO CAPS
900.0000 mg | ORAL_CAPSULE | Freq: Three times a day (TID) | ORAL | 0 refills | Status: DC
  Filled 2023-03-29 (×2): qty 540, 60d supply, fill #0

## 2023-01-29 MED ORDER — OXYCODONE-ACETAMINOPHEN 10-325 MG PO TABS
1.0000 | ORAL_TABLET | Freq: Four times a day (QID) | ORAL | 0 refills | Status: DC | PRN
  Filled 2023-03-24: qty 120, 30d supply, fill #0

## 2023-02-11 ENCOUNTER — Other Ambulatory Visit (HOSPITAL_COMMUNITY): Payer: Self-pay

## 2023-02-11 MED ORDER — PRAVASTATIN SODIUM 20 MG PO TABS
20.0000 mg | ORAL_TABLET | Freq: Every day | ORAL | 1 refills | Status: DC
  Filled 2023-02-11: qty 90, 90d supply, fill #0
  Filled 2023-05-28: qty 90, 90d supply, fill #1

## 2023-02-17 ENCOUNTER — Ambulatory Visit (HOSPITAL_BASED_OUTPATIENT_CLINIC_OR_DEPARTMENT_OTHER): Payer: Medicaid Other | Admitting: Family

## 2023-02-17 ENCOUNTER — Telehealth (HOSPITAL_BASED_OUTPATIENT_CLINIC_OR_DEPARTMENT_OTHER): Payer: Self-pay

## 2023-02-17 ENCOUNTER — Encounter (HOSPITAL_BASED_OUTPATIENT_CLINIC_OR_DEPARTMENT_OTHER): Payer: Self-pay | Admitting: Cardiovascular Disease

## 2023-02-17 ENCOUNTER — Encounter (HOSPITAL_BASED_OUTPATIENT_CLINIC_OR_DEPARTMENT_OTHER): Payer: Self-pay | Admitting: Family

## 2023-02-17 ENCOUNTER — Other Ambulatory Visit (HOSPITAL_BASED_OUTPATIENT_CLINIC_OR_DEPARTMENT_OTHER): Payer: Self-pay

## 2023-02-17 VITALS — BP 118/68 | HR 73 | Ht 73.0 in | Wt 286.0 lb

## 2023-02-17 DIAGNOSIS — E119 Type 2 diabetes mellitus without complications: Secondary | ICD-10-CM | POA: Diagnosis not present

## 2023-02-17 DIAGNOSIS — R600 Localized edema: Secondary | ICD-10-CM | POA: Diagnosis not present

## 2023-02-17 DIAGNOSIS — I10 Essential (primary) hypertension: Secondary | ICD-10-CM

## 2023-02-17 DIAGNOSIS — Z7984 Long term (current) use of oral hypoglycemic drugs: Secondary | ICD-10-CM

## 2023-02-17 DIAGNOSIS — M1612 Unilateral primary osteoarthritis, left hip: Secondary | ICD-10-CM | POA: Diagnosis not present

## 2023-02-17 DIAGNOSIS — R0609 Other forms of dyspnea: Secondary | ICD-10-CM

## 2023-02-17 DIAGNOSIS — E782 Mixed hyperlipidemia: Secondary | ICD-10-CM

## 2023-02-17 MED ORDER — MOUNJARO 12.5 MG/0.5ML ~~LOC~~ SOAJ
12.5000 mg | SUBCUTANEOUS | 1 refills | Status: DC
Start: 2023-02-17 — End: 2023-08-19
  Filled 2023-02-17 – 2023-03-24 (×2): qty 2, 28d supply, fill #0
  Filled 2023-04-18: qty 2, 28d supply, fill #1
  Filled 2023-05-19: qty 2, 28d supply, fill #2
  Filled 2023-06-13: qty 2, 28d supply, fill #3
  Filled 2023-07-11: qty 2, 28d supply, fill #4

## 2023-02-17 NOTE — Patient Instructions (Addendum)
Medication Instructions:  Your physician has recommended you make the following change in your medication:   INCREASE Mounjaro to 12.5mg  once per week.  Recommend following up with primary care regarding your Augmentin.  *If you need a refill on your cardiac medications before your next appointment, please call your pharmacy*  Follow-Up: At [facility], you and your health needs are our priority.  As part of our continuing mission to provide you with exceptional heart care, we have created designated Provider Care Teams.  These Care Teams include your primary Cardiologist (physician) and Advanced Practice Providers (APPs -  Physician Assistants and Nurse Practitioners) who all work together to provide you with the care you need, when you need it.  We recommend signing up for the patient portal called "MyChart".  Sign up information is provided on this After Visit Summary.  MyChart is used to connect with patients for Virtual Visits (Telemedicine).  Patients are able to view lab/test results, encounter notes, upcoming appointments, etc.  Non-urgent messages can be sent to your provider as well.   To learn more about what you can do with MyChart, go to [email or web address].    Your next appointment:   As scheduled in November with Dr. Duke Salvia   Other Instructions  Heart Healthy Diet Recommendations: A low-salt diet is recommended. Meats should be grilled, baked, or boiled. Avoid fried foods. Focus on lean protein sources like fish or chicken with vegetables and fruits. The American Heart Association is a Chief Technology Officer!  American Heart Association Diet and Lifeystyle Recommendations   Exercise recommendations: The American Heart Association recommends 150 minutes of moderate intensity exercise weekly. Try 30 minutes of moderate intensity exercise 4-5 times per week. This could include walking, jogging, or swimming.
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
## 2023-02-17 NOTE — Telephone Encounter (Signed)
Patient dose increased to 12.5mg  today in clinic, may need updated prior auth

## 2023-02-18 NOTE — Telephone Encounter (Signed)
Mounjaro, sorry that might be helpful

## 2023-02-19 ENCOUNTER — Other Ambulatory Visit (HOSPITAL_COMMUNITY): Payer: Self-pay

## 2023-02-19 MED ORDER — PANTOPRAZOLE SODIUM 40 MG PO TBEC
40.0000 mg | DELAYED_RELEASE_TABLET | Freq: Two times a day (BID) | ORAL | 3 refills | Status: DC
  Filled 2023-02-19: qty 60, 30d supply, fill #0
  Filled 2023-03-24: qty 60, 30d supply, fill #1
  Filled 2023-04-18: qty 60, 30d supply, fill #2
  Filled 2023-05-28: qty 60, 30d supply, fill #3

## 2023-02-21 ENCOUNTER — Other Ambulatory Visit (HOSPITAL_COMMUNITY): Payer: Self-pay

## 2023-02-24 ENCOUNTER — Other Ambulatory Visit (HOSPITAL_COMMUNITY): Payer: Self-pay

## 2023-02-24 NOTE — Telephone Encounter (Signed)
PER TEST CLAIM NO PA REQUIRED. PHARMACY FILLED ON 02/17/23, REFILL TOO SOON

## 2023-02-26 ENCOUNTER — Other Ambulatory Visit: Payer: Self-pay

## 2023-03-24 ENCOUNTER — Other Ambulatory Visit (HOSPITAL_COMMUNITY): Payer: Self-pay

## 2023-03-24 MED ORDER — OXYCODONE-ACETAMINOPHEN 10-325 MG PO TABS
ORAL_TABLET | ORAL | 0 refills | Status: DC
  Filled 2023-04-23: qty 120, 30d supply, fill #0

## 2023-03-24 MED ORDER — GABAPENTIN 300 MG PO CAPS
900.0000 mg | ORAL_CAPSULE | Freq: Three times a day (TID) | ORAL | 0 refills | Status: DC
  Filled 2023-05-28: qty 270, 30d supply, fill #0

## 2023-03-29 ENCOUNTER — Other Ambulatory Visit (HOSPITAL_COMMUNITY): Payer: Self-pay

## 2023-03-30 ENCOUNTER — Other Ambulatory Visit (HOSPITAL_COMMUNITY): Payer: Self-pay

## 2023-04-18 ENCOUNTER — Other Ambulatory Visit (HOSPITAL_COMMUNITY): Payer: Self-pay

## 2023-04-21 ENCOUNTER — Other Ambulatory Visit (HOSPITAL_COMMUNITY): Payer: Self-pay

## 2023-04-22 ENCOUNTER — Other Ambulatory Visit (HOSPITAL_COMMUNITY): Payer: Self-pay

## 2023-04-23 ENCOUNTER — Other Ambulatory Visit (HOSPITAL_COMMUNITY): Payer: Self-pay

## 2023-05-01 ENCOUNTER — Other Ambulatory Visit: Payer: Medicaid Other

## 2023-05-01 ENCOUNTER — Other Ambulatory Visit: Payer: Self-pay

## 2023-05-01 NOTE — Patient Outreach (Signed)
Aging Gracefully Program  05/01/2023  Miguel Brady October 30, 1960 [id number]   [facility] Evaluation Interviewer attempted to call patient on today regarding Aging Gracefully referral. No answer from patient after multiple rings. CMA unable to leave confidential voicemail for patient to return call.  Will attempt to call back within 1 week.   Vanice Sarah Care Management Assistant [phone]

## 2023-05-02 NOTE — Patient Outreach (Signed)
Aging Gracefully Program  05/02/2023  Miguel Brady 07-26-1961 [id number]   [facility] Evaluation Interviewer made contact with patient. Aging Gracefully survey completed.   Interviewer will send referral to RN and OT for follow up.   Vanice Sarah Care Management Assistant [phone]

## 2023-05-06 ENCOUNTER — Other Ambulatory Visit: Payer: Self-pay | Admitting: Occupational Therapy
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
## 2023-05-19 ENCOUNTER — Other Ambulatory Visit (HOSPITAL_BASED_OUTPATIENT_CLINIC_OR_DEPARTMENT_OTHER): Payer: Self-pay | Admitting: Cardiovascular Disease

## 2023-05-20 ENCOUNTER — Other Ambulatory Visit (HOSPITAL_COMMUNITY): Payer: Self-pay

## 2023-05-20 ENCOUNTER — Other Ambulatory Visit: Payer: Self-pay

## 2023-05-20 MED ORDER — LISINOPRIL 20 MG PO TABS
20.0000 mg | ORAL_TABLET | Freq: Every day | ORAL | 1 refills | Status: DC
  Filled 2023-05-20: qty 90, 90d supply, fill #0
  Filled 2023-08-14: qty 90, 90d supply, fill #1

## 2023-05-20 NOTE — Telephone Encounter (Signed)
Rx request sent to pharmacy.  

## 2023-05-21 ENCOUNTER — Other Ambulatory Visit (HOSPITAL_COMMUNITY): Payer: Self-pay

## 2023-05-23 ENCOUNTER — Telehealth: Payer: Self-pay

## 2023-05-23 NOTE — Patient Outreach (Signed)
Aging Gracefully Program  05/23/2023  Miguel Brady 10-10-1960 [id number]  Placed call to patient to schedule RN home visit.  Offered 05/30/2023 and patient accepted. Confirmed address.   Rowe Pavy RN, BSN, Careers adviser for [name] Mobile: [phone]

## 2023-05-24 ENCOUNTER — Other Ambulatory Visit (HOSPITAL_COMMUNITY): Payer: Self-pay

## 2023-05-27 ENCOUNTER — Other Ambulatory Visit (HOSPITAL_COMMUNITY): Payer: Self-pay

## 2023-05-27 MED ORDER — OXYCODONE-ACETAMINOPHEN 10-325 MG PO TABS
1.0000 | ORAL_TABLET | Freq: Four times a day (QID) | ORAL | 0 refills | Status: DC | PRN
  Filled 2023-05-27: qty 120, 30d supply, fill #0

## 2023-05-27 MED ORDER — GABAPENTIN 300 MG PO CAPS
900.0000 mg | ORAL_CAPSULE | Freq: Three times a day (TID) | ORAL | 0 refills | Status: DC
  Filled 2023-06-24: qty 810, 90d supply, fill #0

## 2023-05-27 MED ORDER — OXYCODONE-ACETAMINOPHEN 10-325 MG PO TABS
1.0000 | ORAL_TABLET | Freq: Four times a day (QID) | ORAL | 0 refills | Status: DC | PRN
  Filled 2023-06-25: qty 120, 30d supply, fill #0

## 2023-05-28 ENCOUNTER — Other Ambulatory Visit (HOSPITAL_COMMUNITY): Payer: Self-pay

## 2023-05-29 ENCOUNTER — Other Ambulatory Visit (HOSPITAL_COMMUNITY): Payer: Self-pay

## 2023-05-30 ENCOUNTER — Other Ambulatory Visit: Payer: Self-pay
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
## 2023-06-13 ENCOUNTER — Other Ambulatory Visit (HOSPITAL_COMMUNITY): Payer: Self-pay

## 2023-06-22 ENCOUNTER — Other Ambulatory Visit (HOSPITAL_COMMUNITY): Payer: Self-pay

## 2023-06-23 ENCOUNTER — Other Ambulatory Visit (HOSPITAL_COMMUNITY): Payer: Self-pay

## 2023-06-23 MED ORDER — PANTOPRAZOLE SODIUM 40 MG PO TBEC
DELAYED_RELEASE_TABLET | ORAL | 3 refills | Status: DC
  Filled 2023-06-23: qty 60, 30d supply, fill #0
  Filled 2023-08-21: qty 60, 30d supply, fill #1
  Filled 2023-11-14 – 2023-11-15 (×2): qty 60, 30d supply, fill #2
  Filled 2023-12-12: qty 60, 30d supply, fill #3

## 2023-06-24 ENCOUNTER — Other Ambulatory Visit: Payer: Self-pay

## 2023-06-24 ENCOUNTER — Other Ambulatory Visit (HOSPITAL_COMMUNITY): Payer: Self-pay

## 2023-06-25 ENCOUNTER — Other Ambulatory Visit (HOSPITAL_COMMUNITY): Payer: Self-pay

## 2023-07-01 ENCOUNTER — Other Ambulatory Visit: Payer: Self-pay

## 2023-07-01 ENCOUNTER — Other Ambulatory Visit (HOSPITAL_COMMUNITY): Payer: Self-pay

## 2023-07-01 MED ORDER — TESTOSTERONE 20.25 MG/ACT (1.62%) TD GEL
TRANSDERMAL | 3 refills | Status: AC
  Filled 2023-07-01: qty 75, 30d supply, fill #0
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
## 2023-07-02 ENCOUNTER — Other Ambulatory Visit (HOSPITAL_COMMUNITY): Payer: Self-pay
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
## 2023-07-12 ENCOUNTER — Other Ambulatory Visit (HOSPITAL_COMMUNITY): Payer: Self-pay

## 2023-07-18 ENCOUNTER — Encounter (HOSPITAL_BASED_OUTPATIENT_CLINIC_OR_DEPARTMENT_OTHER): Payer: Self-pay | Admitting: Student

## 2023-07-18 ENCOUNTER — Ambulatory Visit (HOSPITAL_BASED_OUTPATIENT_CLINIC_OR_DEPARTMENT_OTHER): Payer: Medicaid Other | Admitting: Student

## 2023-07-18 ENCOUNTER — Ambulatory Visit (HOSPITAL_BASED_OUTPATIENT_CLINIC_OR_DEPARTMENT_OTHER): Payer: Medicaid Other | Admitting: Cardiovascular Disease

## 2023-07-18 ENCOUNTER — Encounter (HOSPITAL_BASED_OUTPATIENT_CLINIC_OR_DEPARTMENT_OTHER): Payer: Self-pay | Admitting: Cardiovascular Disease

## 2023-07-18 ENCOUNTER — Ambulatory Visit (HOSPITAL_BASED_OUTPATIENT_CLINIC_OR_DEPARTMENT_OTHER): Payer: Medicaid Other

## 2023-07-18 VITALS — BP 110/62 | HR 80 | Ht 72.0 in | Wt 281.6 lb

## 2023-07-18 DIAGNOSIS — S99921A Unspecified injury of right foot, initial encounter: Secondary | ICD-10-CM | POA: Insufficient documentation

## 2023-07-18 DIAGNOSIS — G4733 Obstructive sleep apnea (adult) (pediatric): Secondary | ICD-10-CM

## 2023-07-18 DIAGNOSIS — M79671 Pain in right foot: Secondary | ICD-10-CM

## 2023-07-18 DIAGNOSIS — S92424A Nondisplaced fracture of distal phalanx of right great toe, initial encounter for closed fracture: Secondary | ICD-10-CM | POA: Diagnosis not present

## 2023-07-18 DIAGNOSIS — S99921D Unspecified injury of right foot, subsequent encounter: Secondary | ICD-10-CM | POA: Diagnosis not present

## 2023-07-18 DIAGNOSIS — I10 Essential (primary) hypertension: Secondary | ICD-10-CM | POA: Diagnosis not present

## 2023-07-18 NOTE — Patient Instructions (Addendum)
Medication Instructions:  HOLD YOUR PRAVASTATIN FOR 2 WEEKS TO SEE IF PAINS IMPROVE, CALL OR MYCHART WITH UPDATE   *If you need a refill on your cardiac medications before your next appointment, please call your pharmacy*  Lab Work: NONE  Testing/Procedures: XRAY OF RIGHT FOOT  Follow-Up: At [facility], you and your health needs are our priority.  As part of our continuing mission to provide you with exceptional heart care, we have created designated Provider Care Teams.  These Care Teams include your primary Cardiologist (physician) and Advanced Practice Providers (APPs -  Physician Assistants and Nurse Practitioners) who all work together to provide you with the care you need, when you need it.  We recommend signing up for the patient portal called "MyChart".  Sign up information is provided on this After Visit Summary.  MyChart is used to connect with patients for Virtual Visits (Telemedicine).  Patients are able to view lab/test results, encounter notes, upcoming appointments, etc.  Non-urgent messages can be sent to your provider as well.   To learn more about what you can do with MyChart, go to [email or web address].    Your next appointment:   6 month(s)  The format for your next appointment:   In Person  Provider:   Chilton Si, MD or Ronn Melena NP
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
## 2023-07-18 NOTE — Progress Notes (Unsigned)
Chief Complaint: Right big toe pain     History of Present Illness:    Miguel Brady is a 62 y.o. male presenting for evaluation after an injury to his right big toe.  He states that 2 days ago he was moving a 55 gallon drum on the hand truck when it slipped off and fell on his toe.  Today he reports moderate pain particularly while walking.  He says that his toe is swollen and bruised.  He has tried icing and soaking in Epsom salt.  Has taken oxycodone 10 mg for pain and is followed by pain management.  Denies any numbness or tingling.   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  no inhaler   Substance abuse (HCC)    ETOH   TOBACCO ABUSE 12/23/2008   TRIGGER FINGER 05/05/2007   Tuberculosis    pos PPD   Past Surgical History:  Procedure Laterality Date   CHEST TUBE INSERTION  04/03/2012   Procedure: CHEST TUBE INSERTION;  Surgeon: Liz Malady, MD;  Location: [facility] OR;  Service: General;  Laterality: Left;   CHOLECYSTECTOMY  07/28/2012   Procedure: LAPAROSCOPIC CHOLECYSTECTOMY;  Surgeon: Shelly Rubenstein, MD;  Location: [facility];  Service: General;  Laterality: N/A;   EXTERNAL FIXATION  LEG  04/03/2012   Procedure: EXTERNAL FIXATION LEG;  Surgeon: Budd Palmer, MD;  Location: [facility];  Service: Orthopedics;  Laterality: Left;  Left femur   EXTERNAL FIXATION PELVIS  04/03/2012   Procedure: EXTERNAL FIXATION PELVIS;  Surgeon: Budd Palmer, MD;  Location: [facility];  Service: Orthopedics;;   FEMUR IM NAIL  04/07/2012   Procedure: INTRAMEDULLARY (IM) NAIL FEMORAL;  Surgeon: Budd Palmer, MD;  Location: [facility];  Service: Orthopedics;  Laterality: Left;   FLEXIBLE BRONCHOSCOPY  04/07/2012   Procedure: FLEXIBLE BRONCHOSCOPY;  Surgeon: Liz Malady, MD;  Location: [facility] OR;  Service: General;;  START TIME=1645 END TIME=1700   HARDWARE REMOVAL Left 04/01/2022   Procedure: LEFT HIP REMOVE SCREWS AND RETAINED FEMORAL NAIL;  Surgeon: Gean Birchwood, MD;  Location: [facility];  Service: Orthopedics;  Laterality: Left;   INCISION AND DRAINAGE OF WOUND  04/03/2012   Procedure: IRRIGATION AND DEBRIDEMENT WOUND;  Surgeon: Clydene Fake, MD;  Location[facility] OR;  Service: Neurosurgery;  Laterality: N/A;  Frontal.   ORIF PATELLA  04/07/2012   Procedure: OPEN REDUCTION INTERNAL (ORIF) FIXATION PATELLA;  Surgeon: Budd Palmer, MD;  [facility]: [facility];  Service: Orthopedics;  Laterality: Left;   ORIF PELVIC  FRACTURE  04/07/2012   Procedure: OPEN REDUCTION INTERNAL FIXATION (ORIF) PELVIC FRACTURE;  Surgeon: Budd Palmer, MD;  Location: [facility];  Service: Orthopedics;  Laterality: N/A;  Right and left sacroiliac screw pinning,Irrigation and debridebridement open tibia and femur,removal external fixator.   TIBIA IM NAIL INSERTION  04/07/2012   Procedure: INTRAMEDULLARY (IM) NAIL TIBIAL;  Surgeon: Budd Palmer, MD;  Location: [facility];  Service: Orthopedics;  Laterality: Left;   TOTAL HIP ARTHROPLASTY Left 07/29/2022   Procedure: LEFT TOTAL HIP ARTHROPLASTY ANTERIOR APPROACH;  Surgeon: Gean Birchwood, MD;  Location: [facility];  Service: Orthopedics;  Laterality: Left;   Social History   Socioeconomic History    Marital status: Single    Spouse name: Not on file   Number of children: 0   Years of education: college   Highest education level: Not on file  Occupational History   Occupation: disabled    Comment: disabled  Tobacco Use   Smoking status: Former    Current packs/day: 0.50    Average packs/day: 0.5 packs/day for 40.0 years (20.0 ttl pk-yrs)    Types: Cigarettes    Start date: 06/25/2020   Smokeless tobacco: Never  Vaping Use   Vaping status: Never Used  Substance and Sexual Activity   Alcohol use: Not Currently    Comment: occ   Drug use: Not Currently    Comment: pt denies marijuana use for couple of years   Sexual activity: Not on file  Other Topics Concern   Not on file  Social History Narrative   Patient lives at home alone. Patient is single.   Disabled.   Education college.   Left handed.   Caffeine one soda daily.   Social Determinants of Health   Financial Resource Strain: Medium Risk (10/23/2020)   Overall Financial Resource Strain (CARDIA)    Difficulty of Paying Living Expenses: Somewhat hard  Food Insecurity: No Food Insecurity (07/29/2022)   Hunger Vital Sign    Worried About Running Out of Food in the Last Year: Never true    Ran Out of Food in the Last Year: Never true  Transportation Needs: No Transportation Needs (07/29/2022)   PRAPARE - Administrator, Civil Service (Medical): No    Lack of Transportation (Non-Medical): No  Physical Activity: Inactive (10/23/2020)   Exercise Vital Sign    Days of Exercise per Week: 0 days    Minutes of Exercise per Session: 0 min  Stress: Not on file  Social Connections: Unknown (01/28/2022)   [facility] from [facility]   Social Network    Social Network: Not on file   Family History  Problem Relation Age of Onset   Cancer Father    Colon cancer Father    Heart disease Mother    CAD Mother    Hypertension Mother    Diabetes Mother    Hypertension Brother    Colon cancer Paternal  Uncle        pt thinks 8 pat uncles   Esophageal cancer Neg Hx    Rectal cancer Neg Hx    Stomach cancer Neg Hx    Allergies  Allergen Reactions   Shellfish Allergy Anaphylaxis   Iodine Rash   Penicillins Itching    Has patient had a PCN reaction causing immediate rash, facial/tongue/throat swelling, SOB or lightheadedness with hypotension: No Has patient had a PCN reaction causing severe rash involving mucus membranes or skin necrosis: No Has patient had a PCN reaction  that required hospitalization No Has patient had a PCN reaction occurring within the last 10 years: No If all of the above answers are "NO", then may proceed with Cephalosporin use.   Current Outpatient Medications  Medication Sig Dispense Refill   albuterol (VENTOLIN HFA) 108 (90 Base) MCG/ACT inhaler Inhale 1-2 puffs into the lungs every 6 (six) hours as needed for wheezing or shortness of breath.     B-D 3CC LUER-LOK SYR 18GX1-1/2 18G X 1-1/2" 3 ML MISC Inject 1 Syringe into the skin See admin instructions.     B-D 3CC LUER-LOK SYR 22GX1" 22G X 1" 3 ML MISC See admin instructions.     Blood Pressure KIT MONITOR BLOOD PRESSURE DAILY DX I10. 1 kit 0   chlorthalidone (HYGROTON) 25 MG tablet Take 1 tablet (25 mg total) by mouth daily. 90 tablet 3   cholecalciferol (VITAMIN D) 1000 UNITS tablet Take 1,000 Units by mouth at bedtime.      diclofenac Sodium (VOLTAREN) 1 % GEL Apply 2 g topically daily as needed (pain).     diphenhydrAMINE (BENADRYL) 25 MG tablet Take 25 mg by mouth every 6 (six) hours as needed for allergies.     fluticasone (FLONASE) 50 MCG/ACT nasal spray Place 2 sprays into both nostrils daily as needed for allergies.     gabapentin (NEURONTIN) 300 MG capsule Take 3 capsules (900 mg total) by mouth 3 (three) times daily. 810 capsule 0   lisinopril (ZESTRIL) 20 MG tablet Take 1 tablet (20 mg total) by mouth daily. 90 tablet 1   metFORMIN (GLUCOPHAGE) 1000 MG tablet Take 1 tablet (1,000 mg total) by mouth 2  (two) times daily with a meal. 180 tablet 3   methocarbamol (ROBAXIN) 500 MG tablet Take 1 tablet (500 mg total) by mouth 2 (two) times daily as needed for spasms. 180 tablet 1   oxyCODONE-acetaminophen (PERCOCET) 10-325 MG tablet Take 1 tablet by mouth every 6-12 (six-twelve) hours as needed for chronic pain. 120 tablet 0   pantoprazole (PROTONIX) 40 MG tablet Take 1 tablet (40 mg) by mouth 2 times daily. 60 tablet 3   potassium chloride (KLOR-CON) 10 MEQ tablet Take 1 tablet (10 mEq total) by mouth 2 (two) times daily. 180 tablet 3   pravastatin (PRAVACHOL) 20 MG tablet Take 1 tablet (20 mg total) by mouth daily. 90 tablet 1   sildenafil (VIAGRA) 100 MG tablet Take 100 mg by mouth daily as needed for erectile dysfunction.     Testosterone (ANDROGEL PUMP) 20.25 MG/ACT (1.62%) GEL Apply 2 pumps to clean/intact skin on shoulder, upper arms, or stomach every morning 88 g 3   testosterone cypionate (DEPOTESTOSTERONE CYPIONATE) 200 MG/ML injection Inject 0.5 mLs (100 mg total) into the muscle every 14 (fourteen) days. **DISCARD 28 DAYS AFTER FIRST USE.** (Patient not taking: Reported on 07/01/2023) 10 mL 1   tirzepatide (MOUNJARO) 12.5 MG/0.5ML Pen Inject 12.5 mg into the skin once a week. 6 mL 1   No current facility-administered medications for this visit.   No results found.  Review of Systems:   A ROS was performed including pertinent positives and negatives as documented in the HPI.  Physical Exam :   Constitutional: NAD and appears stated age Neurological: Alert and oriented Psych: Appropriate affect and cooperative There were no vitals taken for this visit.   Comprehensive Musculoskeletal Exam:    Swelling and ecchymosis of the right big toe with a hematoma noted over the dorsal toe extending from the IP joint distally to  the base of the nail, but does not involve the nailbed.  Flexion and extension of the first toe intact.  Tenderness over the distal phalanx.  Neurosensory exam  intact.  Imaging:   Xray (right foot 3 views): Nondisplaced comminuted fracture of the first toe distal phalanx   I personally reviewed and interpreted the radiographs.   Assessment:   62 y.o. male with a comminuted distal phalanx fracture of the right great toe.  There is no significant displacement therefore I believe this will heal well with immobilization and nonoperative management.  I will place him in a postop shoe today.  Patient would like the hematoma addressed today as this is causing him discomfort.   I performed a needle I&D of the skin and was able to remove some blood significantly reducing the size of the hematoma.  I have recommended follow-up with pain management.  Will plan to see him back in 4 weeks for x-ray and reevaluation.  Plan :    -Placed in postop shoe today -Return to clinic in 4 weeks for reassessment     I personally saw and evaluated the patient, and participated in the management and treatment plan.  Hazle Nordmann, PA-C Orthopedics

## 2023-07-24 ENCOUNTER — Other Ambulatory Visit (HOSPITAL_COMMUNITY): Payer: Self-pay

## 2023-07-24 MED ORDER — OXYCODONE-ACETAMINOPHEN 10-325 MG PO TABS
1.0000 | ORAL_TABLET | Freq: Four times a day (QID) | ORAL | 0 refills | Status: DC | PRN
  Filled 2023-08-25: qty 120, 30d supply, fill #0

## 2023-07-24 MED ORDER — OXYCODONE-ACETAMINOPHEN 10-325 MG PO TABS
1.0000 | ORAL_TABLET | Freq: Four times a day (QID) | ORAL | 0 refills | Status: DC | PRN
  Filled 2023-07-25: qty 120, 30d supply, fill #0

## 2023-07-25 ENCOUNTER — Other Ambulatory Visit (HOSPITAL_COMMUNITY): Payer: Self-pay

## 2023-07-25 ENCOUNTER — Other Ambulatory Visit: Payer: Self-pay | Admitting: Occupational Therapy

## 2023-07-26 ENCOUNTER — Other Ambulatory Visit (HOSPITAL_COMMUNITY): Payer: Self-pay

## 2023-07-26 NOTE — Patient Instructions (Signed)
Name: Michael Dafoe       Date: 07/25/2023   OT ACTION PLAN-DRESSING  Target Problem Area:   Difficulty with lace up shoes    Why Problem May Occur:     Painful hip that has not done well since surgery    Target Goal(s):   Independence with lace up shoes      STRATEGIES   Saving Your Energy DO:  Sit down to do tasks  Use appropriate adaptive equipment: elastic laces, reacher, long shoe horn     Keep all items you'll need within easy reach    Simplifying the way you set up tasks or daily routines DO:  Wear sturdy shoes that grip the floor     Gather everything you will need before beginning  Wear clothing that is easily manipulated (elastic waistband, etc.)    PRACTICE  Based on what we have talked about, you are willing to try: Using elastic laces and needed If an idea does not work the first time, try it again (and again). We may make some changes over the next few sessions, based on how they work. Ignacia Palma, OTR/L      07/25/2023 Occupational Therapist      Date

## 2023-07-26 NOTE — Patient Outreach (Signed)
Aging Gracefully Program  OT Follow-Up Visit  07/26/2023  ZAYDIN AUGHENBAUGH [birth year] [id number]  Visit:  2- Second Visit  Start Time:  1600 End Time:  1630 Total Minutes:  30  Readiness to Change Score :  Readiness to Change Score: 8  Durable Medical Equipment: Adaptive Equipment: Elastic Shoelaces Adaptive Equipment Distribution Date: 07/25/23   Goals:   Goals Addressed               This Visit's Progress     Patient Stated (pt-stated)        Would like for lower body dressing to be easier (he already has a sock aid and reacher), will look at elastic laces. MET  Name: Barton Canlas       Date: 07/25/2023   OT ACTION PLAN-DRESSING  Target Problem Area:   Difficulty with lace up shoes    Why Problem May Occur:     Painful hip that has not done well since surgery    Target Goal(s):   Independence with lace up shoes      STRATEGIES   Saving Your Energy DO:  Sit down to do tasks  Use appropriate adaptive equipment: elastic laces, reacher, long shoe horn     Keep all items you'll need within easy reach    Simplifying the way you set up tasks or daily routines DO:  Wear sturdy shoes that grip the floor     Gather everything you will need before beginning  Wear clothing that is easily manipulated (elastic waistband, etc.)    PRACTICE  Based on what we have talked about, you are willing to try: Using elastic laces and needed If an idea does not work the first time, try it again (and again). We may make some changes over the next few sessions, based on how they work. Ignacia Palma, OTR/L      07/25/2023 Occupational Therapist      Date        Post Clinical Reasoning: Client Action (Goal) Three Interventions: Pt reported  this visit that he only wear velcto shoes, but that perhaps he does have dress shoes that are lace ups so he wanted a pair of the regular strechy laces Did Client Try?: No Reason Client Did Not Try?:  (was going to look for his  dress shoes later) Clinician View Of Client Situation:: Mr. Grenier stays active despite his painful hip and now he has painful toes (4) due to breaking them recently when something fell on them. Client View Of His/Her Situation:: He is patient but ready to get the home modifications completed Next Visit Plan:: waiting on home modifications  [place], [place] Aging Gracefully [phone]

## 2023-07-29 ENCOUNTER — Encounter: Payer: Self-pay | Admitting: *Deleted

## 2023-07-29 ENCOUNTER — Other Ambulatory Visit: Payer: Self-pay | Admitting: *Deleted
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
## 2023-08-11 ENCOUNTER — Telehealth: Payer: Self-pay | Admitting: Cardiovascular Disease

## 2023-08-11 ENCOUNTER — Other Ambulatory Visit (HOSPITAL_COMMUNITY): Payer: Self-pay

## 2023-08-11 NOTE — Telephone Encounter (Signed)
Patient was seen by orthopedics and treated day he had Xray

## 2023-08-11 NOTE — Telephone Encounter (Signed)
Office of [employer] calling to make sure Dr. Duke Salvia is aware of results from XRAY taken 11/01 - DG Foot Complete Right.

## 2023-08-12 ENCOUNTER — Other Ambulatory Visit (HOSPITAL_COMMUNITY): Payer: Self-pay

## 2023-08-12 MED ORDER — METHOCARBAMOL 500 MG PO TABS
500.0000 mg | ORAL_TABLET | Freq: Two times a day (BID) | ORAL | 1 refills | Status: DC | PRN
Start: 2023-08-12 — End: 2024-04-27
  Filled 2023-08-12: qty 60, 30d supply, fill #0
  Filled 2023-09-18: qty 60, 30d supply, fill #1
  Filled 2023-10-21: qty 60, 30d supply, fill #2
  Filled 2023-11-27: qty 60, 30d supply, fill #3
  Filled 2024-02-13: qty 60, 30d supply, fill #4
  Filled 2024-03-20: qty 60, 30d supply, fill #5

## 2023-08-14 ENCOUNTER — Other Ambulatory Visit (HOSPITAL_COMMUNITY): Payer: Self-pay

## 2023-08-14 ENCOUNTER — Other Ambulatory Visit (HOSPITAL_BASED_OUTPATIENT_CLINIC_OR_DEPARTMENT_OTHER): Payer: Self-pay | Admitting: Family

## 2023-08-14 DIAGNOSIS — E119 Type 2 diabetes mellitus without complications: Secondary | ICD-10-CM

## 2023-08-15 ENCOUNTER — Other Ambulatory Visit: Payer: Self-pay

## 2023-08-19 ENCOUNTER — Other Ambulatory Visit (HOSPITAL_COMMUNITY): Payer: Self-pay

## 2023-08-19 MED ORDER — MOUNJARO 15 MG/0.5ML ~~LOC~~ SOAJ
15.0000 mg | SUBCUTANEOUS | 5 refills | Status: DC
  Filled 2023-08-19: qty 2, 28d supply, fill #0
  Filled 2023-09-18: qty 2, 28d supply, fill #1
  Filled 2023-10-11: qty 2, 28d supply, fill #2
  Filled 2023-11-14: qty 2, 28d supply, fill #3
  Filled 2023-12-03 – 2023-12-12 (×2): qty 2, 28d supply, fill #4
  Filled 2023-12-31 – 2024-01-13 (×3): qty 2, 28d supply, fill #5

## 2023-08-21 ENCOUNTER — Other Ambulatory Visit (HOSPITAL_COMMUNITY): Payer: Self-pay

## 2023-08-22 ENCOUNTER — Other Ambulatory Visit: Payer: Self-pay

## 2023-08-23 ENCOUNTER — Other Ambulatory Visit (HOSPITAL_COMMUNITY): Payer: Self-pay

## 2023-08-25 ENCOUNTER — Other Ambulatory Visit (HOSPITAL_COMMUNITY): Payer: Self-pay

## 2023-09-02 ENCOUNTER — Encounter: Payer: Self-pay | Admitting: *Deleted

## 2023-09-04 ENCOUNTER — Encounter: Payer: Self-pay | Admitting: *Deleted

## 2023-09-04 ENCOUNTER — Other Ambulatory Visit: Payer: Self-pay | Admitting: *Deleted
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
## 2023-09-18 ENCOUNTER — Other Ambulatory Visit: Payer: Self-pay

## 2023-09-20 ENCOUNTER — Other Ambulatory Visit (HOSPITAL_COMMUNITY): Payer: Self-pay

## 2023-09-23 ENCOUNTER — Other Ambulatory Visit (HOSPITAL_COMMUNITY): Payer: Self-pay

## 2023-09-23 MED ORDER — OXYCODONE-ACETAMINOPHEN 10-325 MG PO TABS
1.0000 | ORAL_TABLET | Freq: Four times a day (QID) | ORAL | 0 refills | Status: DC | PRN
  Filled 2023-09-24 – 2023-09-25 (×2): qty 120, 30d supply, fill #0

## 2023-09-23 MED ORDER — GABAPENTIN 300 MG PO CAPS
900.0000 mg | ORAL_CAPSULE | Freq: Three times a day (TID) | ORAL | 0 refills | Status: DC
  Filled 2023-09-23: qty 810, 90d supply, fill #0

## 2023-09-23 MED ORDER — OXYCODONE-ACETAMINOPHEN 10-325 MG PO TABS
1.0000 | ORAL_TABLET | Freq: Four times a day (QID) | ORAL | 0 refills | Status: DC | PRN
  Filled 2023-10-24: qty 120, 30d supply, fill #0

## 2023-09-24 ENCOUNTER — Other Ambulatory Visit (HOSPITAL_COMMUNITY): Payer: Self-pay

## 2023-09-25 ENCOUNTER — Other Ambulatory Visit (HOSPITAL_COMMUNITY): Payer: Self-pay

## 2023-10-01 ENCOUNTER — Other Ambulatory Visit (HOSPITAL_COMMUNITY): Payer: Self-pay

## 2023-10-01 MED ORDER — CIPROFLOXACIN HCL 500 MG PO TABS
500.0000 mg | ORAL_TABLET | Freq: Two times a day (BID) | ORAL | 0 refills | Status: DC
  Filled 2023-10-01: qty 20, 10d supply, fill #0

## 2023-10-09 ENCOUNTER — Other Ambulatory Visit (HOSPITAL_COMMUNITY): Payer: Self-pay

## 2023-10-09 MED ORDER — NITROFURANTOIN MONOHYD MACRO 100 MG PO CAPS
100.0000 mg | ORAL_CAPSULE | Freq: Two times a day (BID) | ORAL | 0 refills | Status: AC
  Filled 2023-10-09: qty 14, 7d supply, fill #0

## 2023-10-11 ENCOUNTER — Other Ambulatory Visit (HOSPITAL_COMMUNITY): Payer: Self-pay

## 2023-10-13 ENCOUNTER — Other Ambulatory Visit (HOSPITAL_COMMUNITY): Payer: Self-pay

## 2023-10-13 ENCOUNTER — Other Ambulatory Visit (HOSPITAL_BASED_OUTPATIENT_CLINIC_OR_DEPARTMENT_OTHER): Payer: Self-pay | Admitting: Family

## 2023-10-13 DIAGNOSIS — I10 Essential (primary) hypertension: Secondary | ICD-10-CM

## 2023-10-13 DIAGNOSIS — R0609 Other forms of dyspnea: Secondary | ICD-10-CM

## 2023-10-17 ENCOUNTER — Other Ambulatory Visit (HOSPITAL_COMMUNITY): Payer: Self-pay

## 2023-10-21 ENCOUNTER — Other Ambulatory Visit: Payer: Self-pay

## 2023-10-21 ENCOUNTER — Other Ambulatory Visit (HOSPITAL_BASED_OUTPATIENT_CLINIC_OR_DEPARTMENT_OTHER): Payer: Self-pay | Admitting: Family

## 2023-10-21 ENCOUNTER — Other Ambulatory Visit (HOSPITAL_COMMUNITY): Payer: Self-pay

## 2023-10-21 DIAGNOSIS — I10 Essential (primary) hypertension: Secondary | ICD-10-CM

## 2023-10-21 DIAGNOSIS — R0609 Other forms of dyspnea: Secondary | ICD-10-CM

## 2023-10-21 MED ORDER — POTASSIUM CHLORIDE CRYS ER 10 MEQ PO TBCR
10.0000 meq | EXTENDED_RELEASE_TABLET | Freq: Every day | ORAL | 1 refills | Status: DC
  Filled 2023-10-21: qty 90, 90d supply, fill #0
  Filled 2023-12-24: qty 90, 90d supply, fill #1

## 2023-10-24 ENCOUNTER — Other Ambulatory Visit (HOSPITAL_COMMUNITY): Payer: Self-pay

## 2023-11-07 ENCOUNTER — Other Ambulatory Visit: Payer: Self-pay | Admitting: Occupational Therapy

## 2023-11-07 NOTE — Patient Outreach (Signed)
 Aging Gracefully Program  OT Follow-Up Visit  11/07/2023  Miguel Brady May 08, 1961 [id number]  Visit:  3- Third Visit  Start Time:  0905 End Time:  1015 Total Minutes:  70  Readiness to Change Score :  Readiness to Change Score: 7    Goals:   Goals Addressed               This Visit's Progress     COMPLETED: Patient Stated (pt-stated)        He would like to feel safer in his bathrooms. He would like a full tub to shower conversion in the master bathroom. He will also need a hand held shower head with lower holder and grab bars. Grab bar/bars at both toilets. MET  ACTION PLANNING - BATHING  Target Problem Area: Decreased safety with showering  Why Problem May Occur: Due to painful hip that makes stepping into tub hard   Target Goal: Safety and independence with showering   STRATEGIES Saving Your Energy: DO: DON'T:  Keep all items you'll need within easy reach Rush   Modifying your home environment and making it safe: DO: DON'T:  Use grab bars in the shower    Place a rubber mat the size you need in the shower Place loose rugs in the bathroom- they can trip you or your walker/cane can get caught on them  Make sure the bathroom is well lit    Use handheld shower head    Simplifying the way you set up tasks or daily routines: DO: DON'T:  Plan to shower before you're overly tired [employer] through [employer] all items before getting started    Practice It is important to practice the strategies so we can determine if they will be effective in helping to reach your goal. Follow these specific recommendations: Use a non-skid mat or not self-adhesive no skid strips on shower tray when in the shower. Use handheld shower and grab bars as needed to maintain safety in shower.  If a strategy does not work the first time, try it again and again (and maybe again). Ignacia Palma, OTR/L       11/07/2023        Post Clinical Reasoning: Client Action  (Goal) One Interventions: Client happy about his walk in shower, not so much about his comfort height toilet--says it is smaller than is original one width wise. There are a couple of things that need to be fixed (piece sliding away from wall where shower head connects to wall and where water cut on.off is there is a gap where water can get behind the shower wall. Did Client Try?: Yes Targeted Problem Area Status: A Lot Better Clinician View Of Client Situation:: Mr. Taha continues to be active despite the pain in his hip, He is happy with the walk in shower and is hoping that they get the roof fixed sooner than later and then the ramp Client View Of His/Her Situation:: Thankful for the walk in shower, still wishing his hip pain was better (sees a doctor in [place]). Next Visit Plan:: Waiting on the rampt ot be completed

## 2023-11-08 ENCOUNTER — Other Ambulatory Visit (HOSPITAL_COMMUNITY): Payer: Self-pay

## 2023-11-14 ENCOUNTER — Other Ambulatory Visit (HOSPITAL_BASED_OUTPATIENT_CLINIC_OR_DEPARTMENT_OTHER): Payer: Self-pay | Admitting: Family

## 2023-11-14 ENCOUNTER — Other Ambulatory Visit (HOSPITAL_BASED_OUTPATIENT_CLINIC_OR_DEPARTMENT_OTHER): Payer: Self-pay | Admitting: Cardiovascular Disease

## 2023-11-14 ENCOUNTER — Other Ambulatory Visit (HOSPITAL_COMMUNITY): Payer: Self-pay

## 2023-11-14 DIAGNOSIS — I10 Essential (primary) hypertension: Secondary | ICD-10-CM

## 2023-11-14 DIAGNOSIS — R0609 Other forms of dyspnea: Secondary | ICD-10-CM

## 2023-11-15 ENCOUNTER — Other Ambulatory Visit (HOSPITAL_COMMUNITY): Payer: Self-pay

## 2023-11-17 ENCOUNTER — Other Ambulatory Visit (HOSPITAL_COMMUNITY): Payer: Self-pay

## 2023-11-17 ENCOUNTER — Other Ambulatory Visit: Payer: Self-pay

## 2023-11-17 MED ORDER — LISINOPRIL 20 MG PO TABS
20.0000 mg | ORAL_TABLET | Freq: Every day | ORAL | 1 refills | Status: DC
  Filled 2023-11-17: qty 90, 90d supply, fill #0
  Filled 2024-02-13: qty 90, 90d supply, fill #1

## 2023-11-17 MED ORDER — POTASSIUM CHLORIDE ER 10 MEQ PO TBCR
10.0000 meq | EXTENDED_RELEASE_TABLET | Freq: Two times a day (BID) | ORAL | 3 refills | Status: DC
  Filled 2023-11-17 – 2023-12-19 (×3): qty 180, 90d supply, fill #0

## 2023-11-20 ENCOUNTER — Other Ambulatory Visit (HOSPITAL_COMMUNITY): Payer: Self-pay

## 2023-11-20 MED ORDER — OXYCODONE-ACETAMINOPHEN 10-325 MG PO TABS
1.0000 | ORAL_TABLET | Freq: Four times a day (QID) | ORAL | 0 refills | Status: DC | PRN
  Filled 2023-11-24: qty 120, 30d supply, fill #0

## 2023-11-20 MED ORDER — GABAPENTIN 300 MG PO CAPS
900.0000 mg | ORAL_CAPSULE | Freq: Three times a day (TID) | ORAL | 0 refills | Status: DC
  Filled 2023-12-19: qty 810, 90d supply, fill #0

## 2023-11-20 MED ORDER — OXYCODONE-ACETAMINOPHEN 10-325 MG PO TABS
1.0000 | ORAL_TABLET | Freq: Four times a day (QID) | ORAL | 0 refills | Status: DC | PRN
  Filled 2023-12-23: qty 120, 30d supply, fill #0

## 2023-11-22 ENCOUNTER — Other Ambulatory Visit (HOSPITAL_COMMUNITY): Payer: Self-pay

## 2023-11-24 ENCOUNTER — Other Ambulatory Visit (HOSPITAL_COMMUNITY): Payer: Self-pay

## 2023-11-26 ENCOUNTER — Other Ambulatory Visit (HOSPITAL_COMMUNITY): Payer: Self-pay

## 2023-12-03 ENCOUNTER — Other Ambulatory Visit (HOSPITAL_COMMUNITY): Payer: Self-pay

## 2023-12-12 ENCOUNTER — Other Ambulatory Visit: Payer: Self-pay

## 2023-12-12 ENCOUNTER — Other Ambulatory Visit (HOSPITAL_COMMUNITY): Payer: Self-pay

## 2023-12-12 MED ORDER — METFORMIN HCL 1000 MG PO TABS
1000.0000 mg | ORAL_TABLET | Freq: Two times a day (BID) | ORAL | 3 refills | Status: AC
Start: 2023-12-12 — End: ?
  Filled 2023-12-12: qty 180, 90d supply, fill #0
  Filled 2024-07-07: qty 180, 90d supply, fill #1

## 2023-12-16 ENCOUNTER — Other Ambulatory Visit (HOSPITAL_COMMUNITY): Payer: Self-pay

## 2023-12-16 ENCOUNTER — Other Ambulatory Visit: Payer: Self-pay

## 2023-12-16 MED ORDER — "BD DISP NEEDLES 22G X 1-1/2"" MISC"
11 refills | Status: DC
  Filled 2023-12-16: qty 4, 28d supply, fill #0
  Filled 2024-02-13: qty 4, 28d supply, fill #1
  Filled 2024-03-08: qty 4, 28d supply, fill #2
  Filled 2024-05-24: qty 4, 28d supply, fill #3
  Filled 2024-07-07: qty 4, 28d supply, fill #4

## 2023-12-16 MED ORDER — TESTOSTERONE CYPIONATE 200 MG/ML IM SOLN
100.0000 mg | INTRAMUSCULAR | 0 refills | Status: AC
  Filled 2023-12-16: qty 4, 28d supply, fill #0
  Filled 2024-02-13: qty 4, 28d supply, fill #1

## 2023-12-16 MED ORDER — "BD DISP NEEDLES 18G X 1-1/2"" MISC"
11 refills | Status: DC
  Filled 2023-12-16: qty 4, 28d supply, fill #0
  Filled 2024-02-13: qty 4, 28d supply, fill #1
  Filled 2024-03-08: qty 4, 28d supply, fill #2
  Filled 2024-05-24: qty 4, 28d supply, fill #3
  Filled 2024-07-07: qty 4, 28d supply, fill #4

## 2023-12-19 ENCOUNTER — Other Ambulatory Visit: Payer: Self-pay

## 2023-12-19 ENCOUNTER — Other Ambulatory Visit (HOSPITAL_COMMUNITY): Payer: Self-pay

## 2023-12-20 ENCOUNTER — Other Ambulatory Visit (HOSPITAL_COMMUNITY): Payer: Self-pay

## 2023-12-22 ENCOUNTER — Other Ambulatory Visit: Payer: Self-pay

## 2023-12-22 ENCOUNTER — Other Ambulatory Visit (HOSPITAL_COMMUNITY): Payer: Self-pay

## 2023-12-23 ENCOUNTER — Other Ambulatory Visit: Payer: Self-pay

## 2023-12-23 ENCOUNTER — Other Ambulatory Visit (HOSPITAL_COMMUNITY): Payer: Self-pay

## 2023-12-24 ENCOUNTER — Other Ambulatory Visit (HOSPITAL_COMMUNITY): Payer: Self-pay

## 2023-12-29 ENCOUNTER — Other Ambulatory Visit (HOSPITAL_COMMUNITY): Payer: Self-pay

## 2023-12-29 MED ORDER — POTASSIUM CHLORIDE ER 10 MEQ PO TBCR
10.0000 meq | EXTENDED_RELEASE_TABLET | Freq: Every day | ORAL | 1 refills | Status: DC
  Filled 2023-12-29 – 2024-03-08 (×2): qty 90, 90d supply, fill #0
  Filled 2024-06-23 (×2): qty 90, 90d supply, fill #1

## 2023-12-31 ENCOUNTER — Other Ambulatory Visit (HOSPITAL_COMMUNITY): Payer: Self-pay

## 2024-01-12 ENCOUNTER — Telehealth: Payer: Self-pay | Admitting: Pharmacy Technician

## 2024-01-12 ENCOUNTER — Other Ambulatory Visit (HOSPITAL_COMMUNITY): Payer: Self-pay

## 2024-01-12 NOTE — Telephone Encounter (Signed)
 Pharmacy Patient Advocate Encounter   Received notification from CoverMyMeds that prior authorization for Mounjaro  is required/requested.   Insurance verification completed.   The patient is insured through [facility] .   Per test claim: PA required; PA submitted to above mentioned insurance via CoverMyMeds Key/confirmation #/EOC  [id number] Status is pending

## 2024-01-12 NOTE — Telephone Encounter (Signed)
 Pharmacy Patient Advocate Encounter  Received notification from [facility] that Prior Authorization for mounjaro  has been CANCELLED due to being available without PA- $4.00 copay   PA #/Case ID/Reference #:  [id number]

## 2024-01-13 ENCOUNTER — Other Ambulatory Visit: Payer: Self-pay | Admitting: Acute Care

## 2024-01-13 ENCOUNTER — Other Ambulatory Visit: Payer: Self-pay

## 2024-01-13 ENCOUNTER — Other Ambulatory Visit (HOSPITAL_COMMUNITY): Payer: Self-pay

## 2024-01-13 ENCOUNTER — Other Ambulatory Visit (HOSPITAL_BASED_OUTPATIENT_CLINIC_OR_DEPARTMENT_OTHER): Payer: Self-pay | Admitting: Cardiovascular Disease

## 2024-01-13 DIAGNOSIS — Z122 Encounter for screening for malignant neoplasm of respiratory organs: Secondary | ICD-10-CM

## 2024-01-13 DIAGNOSIS — Z87891 Personal history of nicotine dependence: Secondary | ICD-10-CM

## 2024-01-13 MED ORDER — PANTOPRAZOLE SODIUM 40 MG PO TBEC
DELAYED_RELEASE_TABLET | ORAL | 3 refills | Status: AC
  Filled 2024-01-13: qty 60, 30d supply, fill #0
  Filled 2024-03-08: qty 60, 30d supply, fill #1
  Filled 2024-05-24: qty 60, 30d supply, fill #2

## 2024-01-14 ENCOUNTER — Other Ambulatory Visit (HOSPITAL_COMMUNITY): Payer: Self-pay

## 2024-01-14 ENCOUNTER — Other Ambulatory Visit: Payer: Self-pay | Admitting: Occupational Therapy

## 2024-01-14 MED ORDER — CHLORTHALIDONE 25 MG PO TABS
25.0000 mg | ORAL_TABLET | Freq: Every day | ORAL | 3 refills | Status: DC
  Filled 2024-01-14: qty 90, 90d supply, fill #0
  Filled 2024-02-13 – 2024-03-08 (×2): qty 90, 90d supply, fill #1
  Filled 2024-03-13: qty 30, 30d supply, fill #1
  Filled 2024-03-20: qty 30, 30d supply, fill #2
  Filled 2024-05-25: qty 30, 30d supply, fill #3
  Filled 2024-06-11: qty 90, 90d supply, fill #4
  Filled 2024-06-17: qty 30, 30d supply, fill #4
  Filled 2024-08-19: qty 30, 30d supply, fill #5

## 2024-01-18 NOTE — Patient Outreach (Signed)
 Aging Gracefully Program  OT FINAL Visit  01/18/2024  Miguel Brady Aug 14, 1961 [id number]  Visit:  4- Fourth Visit  Start Time:  1030 End Time:  1130 Total Minutes:  60  Readiness to Change:  Readiness to Change Score: 7  Patient Education: Education Provided: Yes Education Details: Getting up from a fall and safety at home  Goals:  Goals Addressed               This Visit's Progress     COMPLETED: Patient Stated (pt-stated)        He would like to feel safer getting in and out of the house. Hope that stairs at front can be fixed. Fix or replace deck at back and add ramp.MET  [name] has been using his new ramp for a couple of weeks now and is really happy with it. He is in the process of making sure the ground under it is covered in such a way as to keep grass from growing under it and is putting up wire and lattice fence to keep "critters" out from under it. He is really happy with how it turned out.      COMPLETED: Patient Stated (pt-stated)        Would like for lower body dressing to be easier (he already has a sock aid and reacher), will look at elastic laces. MET  Name: Miguel Brady       Date: 07/25/2023   OT ACTION PLAN-DRESSING  Target Problem Area:   Difficulty with lace up shoes    Why Problem May Occur:     Painful hip that has not done well since surgery    Target Goal(s):   Independence with lace up shoes      STRATEGIES   Saving Your Energy DO:  Sit down to do tasks  Use appropriate adaptive equipment: elastic laces, reacher, long shoe horn     Keep all items you'll need within easy reach    Simplifying the way you set up tasks or daily routines DO:  Wear sturdy shoes that grip the floor     Gather everything you will need before beginning  Wear clothing that is easily manipulated (elastic waistband, etc.)    PRACTICE  Based on what we have talked about, you are willing to try: Using elastic laces and needed If an idea  does not work the first time, try it again (and again). We may make some changes over the next few sessions, based on how they work. Fletcher Humble, OTR/L      07/25/2023 Occupational Therapist      Date        Post Clinical Reasoning: Client Action (Goal) Two Interventions: New deck with ramp Did Client Try?: Yes Targeted Problem Area Status: A Lot Better Clinician View Of Client Situation:: [name] is doing well, walking without his cane at times now even on uneven ground. He continues to be busy. Client View Of His/Her Situation:: [name] is very thankful for what [facility] and the Aging Gracefully program has done for him. He reports the couple of issues he had before have been fixed by [facility]. Next Visit Plan:: This was the last AG visit  Caryl Clas, [place] Aging Gracefully [phone]

## 2024-01-21 ENCOUNTER — Other Ambulatory Visit (HOSPITAL_COMMUNITY): Payer: Self-pay

## 2024-01-21 MED ORDER — OXYCODONE-ACETAMINOPHEN 10-325 MG PO TABS
1.0000 | ORAL_TABLET | ORAL | 0 refills | Status: DC
Start: 2024-02-21 — End: 2024-03-23
  Filled 2024-02-23: qty 70, 18d supply, fill #0
  Filled 2024-02-23: qty 50, 12d supply, fill #0

## 2024-01-21 MED ORDER — GABAPENTIN 300 MG PO CAPS
900.0000 mg | ORAL_CAPSULE | Freq: Three times a day (TID) | ORAL | 0 refills | Status: DC
  Filled 2024-03-20 (×2): qty 810, 90d supply, fill #0

## 2024-01-21 MED ORDER — OXYCODONE-ACETAMINOPHEN 10-325 MG PO TABS
1.0000 | ORAL_TABLET | ORAL | 0 refills | Status: DC
  Filled 2024-01-22: qty 120, 30d supply, fill #0

## 2024-01-22 ENCOUNTER — Other Ambulatory Visit (HOSPITAL_COMMUNITY): Payer: Self-pay

## 2024-02-03 ENCOUNTER — Ambulatory Visit
Admission: RE | Admit: 2024-02-03 | Discharge: 2024-02-03 | Disposition: A | Discharge status: Home / Self Care | Source: Ambulatory Visit | Attending: Family Medicine | Admitting: Family Medicine

## 2024-02-03 DIAGNOSIS — Z87891 Personal history of nicotine dependence: Secondary | ICD-10-CM

## 2024-02-03 DIAGNOSIS — Z122 Encounter for screening for malignant neoplasm of respiratory organs: Secondary | ICD-10-CM

## 2024-02-13 ENCOUNTER — Other Ambulatory Visit (HOSPITAL_BASED_OUTPATIENT_CLINIC_OR_DEPARTMENT_OTHER): Payer: Self-pay | Admitting: Cardiovascular Disease

## 2024-02-13 ENCOUNTER — Other Ambulatory Visit: Payer: Self-pay

## 2024-02-13 ENCOUNTER — Other Ambulatory Visit (HOSPITAL_COMMUNITY): Payer: Self-pay

## 2024-02-13 DIAGNOSIS — E119 Type 2 diabetes mellitus without complications: Secondary | ICD-10-CM

## 2024-02-13 MED ORDER — MOUNJARO 15 MG/0.5ML ~~LOC~~ SOAJ
15.0000 mg | SUBCUTANEOUS | 0 refills | Status: DC
  Filled 2024-02-13: qty 2, 28d supply, fill #0

## 2024-02-14 ENCOUNTER — Other Ambulatory Visit (HOSPITAL_COMMUNITY): Payer: Self-pay

## 2024-02-17 ENCOUNTER — Other Ambulatory Visit (HOSPITAL_COMMUNITY): Payer: Self-pay

## 2024-02-23 ENCOUNTER — Other Ambulatory Visit (HOSPITAL_COMMUNITY): Payer: Self-pay

## 2024-02-27 ENCOUNTER — Other Ambulatory Visit: Payer: Self-pay | Admitting: Acute Care

## 2024-02-27 DIAGNOSIS — Z87891 Personal history of nicotine dependence: Secondary | ICD-10-CM

## 2024-02-27 DIAGNOSIS — Z122 Encounter for screening for malignant neoplasm of respiratory organs: Secondary | ICD-10-CM

## 2024-03-08 ENCOUNTER — Other Ambulatory Visit: Payer: Self-pay

## 2024-03-08 ENCOUNTER — Other Ambulatory Visit (HOSPITAL_BASED_OUTPATIENT_CLINIC_OR_DEPARTMENT_OTHER): Payer: Self-pay | Admitting: Cardiovascular Disease

## 2024-03-08 ENCOUNTER — Other Ambulatory Visit (HOSPITAL_COMMUNITY): Payer: Self-pay

## 2024-03-08 DIAGNOSIS — E119 Type 2 diabetes mellitus without complications: Secondary | ICD-10-CM

## 2024-03-09 ENCOUNTER — Other Ambulatory Visit: Payer: Self-pay

## 2024-03-09 ENCOUNTER — Other Ambulatory Visit (HOSPITAL_COMMUNITY): Payer: Self-pay

## 2024-03-09 MED ORDER — MOUNJARO 15 MG/0.5ML ~~LOC~~ SOAJ
15.0000 mg | SUBCUTANEOUS | 0 refills | Status: DC
  Filled 2024-03-09: qty 6, 84d supply, fill #0

## 2024-03-09 NOTE — Patient Outreach (Signed)
 Aging Gracefully Program  03/09/2024  Miguel Brady 07/24/1961 [id number]   [facility] Evaluation Interviewer made contact with patient. Aging Gracefully 5 month survey completed.      Shereen [facility]  Population Health Care Management Assistant  Direct Dial: [phone]  Fax: [phone] Website: [email or web address]

## 2024-03-13 ENCOUNTER — Other Ambulatory Visit (HOSPITAL_COMMUNITY): Payer: Self-pay

## 2024-03-20 ENCOUNTER — Other Ambulatory Visit (HOSPITAL_COMMUNITY): Payer: Self-pay

## 2024-03-23 ENCOUNTER — Other Ambulatory Visit (HOSPITAL_COMMUNITY): Payer: Self-pay

## 2024-03-23 MED ORDER — OXYCODONE-ACETAMINOPHEN 10-325 MG PO TABS
1.0000 | ORAL_TABLET | ORAL | 0 refills | Status: DC
  Filled 2024-03-23: qty 120, 30d supply, fill #0

## 2024-03-23 MED ORDER — GABAPENTIN 300 MG PO CAPS
900.0000 mg | ORAL_CAPSULE | Freq: Three times a day (TID) | ORAL | 0 refills | Status: DC
  Filled 2024-06-23: qty 810, 90d supply, fill #0

## 2024-04-02 ENCOUNTER — Emergency Department (HOSPITAL_BASED_OUTPATIENT_CLINIC_OR_DEPARTMENT_OTHER)

## 2024-04-02 ENCOUNTER — Other Ambulatory Visit (HOSPITAL_BASED_OUTPATIENT_CLINIC_OR_DEPARTMENT_OTHER): Payer: Self-pay

## 2024-04-02 ENCOUNTER — Other Ambulatory Visit: Payer: Self-pay

## 2024-04-02 ENCOUNTER — Encounter (HOSPITAL_BASED_OUTPATIENT_CLINIC_OR_DEPARTMENT_OTHER): Payer: Self-pay

## 2024-04-02 ENCOUNTER — Emergency Department (HOSPITAL_BASED_OUTPATIENT_CLINIC_OR_DEPARTMENT_OTHER)
Admission: EM | Admit: 2024-04-02 | Discharge: 2024-04-02 | Disposition: A | Discharge status: Home / Self Care | Source: Ambulatory Visit | Attending: Emergency Medicine | Admitting: Emergency Medicine

## 2024-04-02 DIAGNOSIS — Z7984 Long term (current) use of oral hypoglycemic drugs: Secondary | ICD-10-CM | POA: Insufficient documentation

## 2024-04-02 DIAGNOSIS — S91312A Laceration without foreign body, left foot, initial encounter: Secondary | ICD-10-CM | POA: Insufficient documentation

## 2024-04-02 DIAGNOSIS — S9032XA Contusion of left foot, initial encounter: Secondary | ICD-10-CM

## 2024-04-02 DIAGNOSIS — E119 Type 2 diabetes mellitus without complications: Secondary | ICD-10-CM | POA: Diagnosis not present

## 2024-04-02 DIAGNOSIS — S99922A Unspecified injury of left foot, initial encounter: Secondary | ICD-10-CM | POA: Diagnosis present

## 2024-04-02 MED ORDER — DOXYCYCLINE HYCLATE 100 MG PO CAPS
100.0000 mg | ORAL_CAPSULE | Freq: Two times a day (BID) | ORAL | 0 refills | Status: DC
  Filled 2024-04-02: qty 20, 10d supply, fill #0

## 2024-04-02 MED ORDER — OXYCODONE HCL 5 MG PO TABS
5.0000 mg | ORAL_TABLET | Freq: Four times a day (QID) | ORAL | 0 refills | Status: AC | PRN
  Filled 2024-04-02: qty 10, 3d supply, fill #0

## 2024-04-02 MED ORDER — BACITRACIN ZINC 500 UNIT/GM EX OINT
TOPICAL_OINTMENT | Freq: Two times a day (BID) | CUTANEOUS | Status: DC
  Administered 2024-04-02: 31.5 via TOPICAL
  Filled 2024-04-02: qty 28.35
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
## 2024-04-02 NOTE — Discharge Instructions (Addendum)
 Use walking boot for comfort.  Use bacitracin  ointment twice daily to the wound to the back of your left foot.  Keep this area clean and dry.  Wash with soap and water .  Make sure this area stays clean.  Take antibiotics as prescribed.  Follow-up closely with your primary care doctor for wound recheck.  I prescribed you narcotic pain medicine called Roxicodone  for breakthrough pain.  This medication is sedating so please be careful with its use.

## 2024-04-05 ENCOUNTER — Encounter (HOSPITAL_BASED_OUTPATIENT_CLINIC_OR_DEPARTMENT_OTHER): Payer: Self-pay

## 2024-04-05 ENCOUNTER — Other Ambulatory Visit: Payer: Self-pay

## 2024-04-05 ENCOUNTER — Emergency Department (HOSPITAL_BASED_OUTPATIENT_CLINIC_OR_DEPARTMENT_OTHER): Admission: EM | Admit: 2024-04-05 | Discharge: 2024-04-05 | Disposition: A | Discharge status: Home / Self Care

## 2024-04-05 DIAGNOSIS — S91312D Laceration without foreign body, left foot, subsequent encounter: Secondary | ICD-10-CM | POA: Diagnosis present

## 2024-04-05 DIAGNOSIS — Z7984 Long term (current) use of oral hypoglycemic drugs: Secondary | ICD-10-CM | POA: Insufficient documentation

## 2024-04-05 DIAGNOSIS — E119 Type 2 diabetes mellitus without complications: Secondary | ICD-10-CM | POA: Diagnosis not present

## 2024-04-05 DIAGNOSIS — Z5181 Encounter for therapeutic drug level monitoring: Secondary | ICD-10-CM | POA: Insufficient documentation

## 2024-04-05 DIAGNOSIS — X58XXXD Exposure to other specified factors, subsequent encounter: Secondary | ICD-10-CM | POA: Insufficient documentation

## 2024-04-05 DIAGNOSIS — Z5189 Encounter for other specified aftercare: Secondary | ICD-10-CM

## 2024-04-05 DIAGNOSIS — Z79899 Other long term (current) drug therapy: Secondary | ICD-10-CM
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
## 2024-04-05 NOTE — ED Triage Notes (Signed)
 Pt here for follow up of L foot injury; seen for same approx 3 days ago, states he has questions about prescriptions & wants to make sure foot is healing as it should be. No compliant

## 2024-04-05 NOTE — Discharge Instructions (Addendum)
 You were seen in the emergency department today for follow-up on the wound to your left heel.  It appears that this is healing appropriately.  Please continue to use the oxycodone  that was prescribed to you a few days ago as needed for severe pain.  This is an appropriate use of this medication but please be sure to inform your pain management group regarding the addition of this medication to your current regimen.  For any concerns or new or worsening symptoms, return to the emergency department.

## 2024-04-22 ENCOUNTER — Other Ambulatory Visit (HOSPITAL_COMMUNITY): Payer: Self-pay

## 2024-04-22 MED ORDER — OXYCODONE-ACETAMINOPHEN 10-325 MG PO TABS
1.0000 | ORAL_TABLET | ORAL | 0 refills | Status: DC
  Filled 2024-04-22: qty 120, 30d supply, fill #0

## 2024-04-22 MED ORDER — OXYCODONE-ACETAMINOPHEN 10-325 MG PO TABS
1.0000 | ORAL_TABLET | ORAL | 0 refills | Status: DC
  Filled 2024-05-24: qty 112, 28d supply, fill #0
  Filled 2024-05-24: qty 8, 2d supply, fill #0
  Filled 2024-05-24 (×2): qty 120, 30d supply, fill #0

## 2024-04-26 ENCOUNTER — Other Ambulatory Visit (HOSPITAL_COMMUNITY): Payer: Self-pay

## 2024-04-27 ENCOUNTER — Other Ambulatory Visit (HOSPITAL_COMMUNITY): Payer: Self-pay

## 2024-04-27 MED ORDER — METHOCARBAMOL 500 MG PO TABS
500.0000 mg | ORAL_TABLET | Freq: Two times a day (BID) | ORAL | 1 refills | Status: AC | PRN
  Filled 2024-04-27 – 2024-05-08 (×2): qty 60, 30d supply, fill #0
  Filled 2024-06-11 (×3): qty 60, 30d supply, fill #1
  Filled 2024-07-25: qty 60, 30d supply, fill #2
  Filled 2024-08-19: qty 60, 30d supply, fill #3
  Filled 2024-10-18: qty 60, 30d supply, fill #4

## 2024-04-27 MED ORDER — LISINOPRIL 10 MG PO TABS
10.0000 mg | ORAL_TABLET | Freq: Every day | ORAL | 1 refills | Status: DC
  Filled 2024-04-27: qty 30, 30d supply, fill #0
  Filled 2024-05-08: qty 90, 90d supply, fill #0
  Filled 2024-08-19: qty 90, 90d supply, fill #1

## 2024-04-27 MED ORDER — METFORMIN HCL 1000 MG PO TABS
1000.0000 mg | ORAL_TABLET | Freq: Two times a day (BID) | ORAL | 3 refills | Status: AC
  Filled 2024-04-27: qty 60, 30d supply, fill #0
  Filled 2024-04-27: qty 120, 60d supply, fill #0
  Filled 2024-05-08: qty 180, 90d supply, fill #0
  Filled 2024-08-19: qty 180, 90d supply, fill #1

## 2024-04-27 MED ORDER — CLOTRIMAZOLE-BETAMETHASONE 1-0.05 % EX CREA
1.0000 | TOPICAL_CREAM | Freq: Two times a day (BID) | CUTANEOUS | 1 refills | Status: AC
  Filled 2024-04-27 – 2024-05-08 (×2): qty 15, 8d supply, fill #0
  Filled 2024-05-24: qty 15, 8d supply, fill #1

## 2024-05-04 ENCOUNTER — Other Ambulatory Visit: Payer: Self-pay | Admitting: Family Medicine

## 2024-05-04 DIAGNOSIS — N289 Disorder of kidney and ureter, unspecified: Secondary | ICD-10-CM

## 2024-05-06 ENCOUNTER — Ambulatory Visit: Admitting: Plastic Surgery

## 2024-05-06 VITALS — BP 90/59 | HR 88 | Ht 72.0 in | Wt 255.6 lb

## 2024-05-06 DIAGNOSIS — R21 Rash and other nonspecific skin eruption: Secondary | ICD-10-CM

## 2024-05-06 DIAGNOSIS — M793 Panniculitis, unspecified: Secondary | ICD-10-CM

## 2024-05-06 DIAGNOSIS — G894 Chronic pain syndrome: Secondary | ICD-10-CM

## 2024-05-06 DIAGNOSIS — M542 Cervicalgia: Secondary | ICD-10-CM

## 2024-05-06 DIAGNOSIS — G8929 Other chronic pain: Secondary | ICD-10-CM

## 2024-05-06 DIAGNOSIS — M546 Pain in thoracic spine: Secondary | ICD-10-CM

## 2024-05-06 DIAGNOSIS — Z9884 Bariatric surgery status: Secondary | ICD-10-CM

## 2024-05-06 DIAGNOSIS — M549 Dorsalgia, unspecified: Secondary | ICD-10-CM | POA: Insufficient documentation

## 2024-05-06 NOTE — Progress Notes (Signed)
 Patient ID: Miguel Brady, male    DOB: [birth year], 63 y.o.   MRN: [id number]   Chief Complaint  Patient presents with   Advice Only    The patient is a 63 year old male here for evaluation of his abdomen.  He is 255 pounds and 6 feet tall.  He has been very diligent about weight loss over the past several years.  He has lost over 100 pounds.  He did have weight loss surgery with gastric bypass.  He does not remember exactly when that was.  He has quite a bit of a pannus that hangs over.  He complains about skin breakdown and rashes the skin folds.  These are worse in the summer but occur all year-round.  He has tried different treatments to try and improve it but nothing seems to alleviate it completely.  He had a rash today but I could not get the camera to pick it up due to the lighting and the contrast between that and his skin color.  He also complains of back and neck pain.  He has had some rough habits in the past with drugs and alcohol.  He is clean now and trying to be healthy and take care of himself.  He had a inguinal hernia from what he describes that was repaired when he had his hip replaced.  He has no known hernias and I did not feel any on exam today.  The patient has a history of diabetes, arthritis, hyperlipidemia, neuromuscular disorder, alcohol and drug abuse.  His previous surgeries are listed below.  The patient is part of a pain management group and understands that he will not get pain medicine from us .  The patient quit smoking several years ago.    Review of Systems  Constitutional:  Positive for activity change. Negative for appetite change.  Eyes: Negative.   Respiratory: Negative.    Cardiovascular: Negative.   Gastrointestinal: Negative.   Endocrine: Negative.   Genitourinary: Negative.   Musculoskeletal:  Positive for back pain and neck pain.  Skin:  Positive for rash.  Hematological: Negative.     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  no inhaler   Substance abuse (HCC)    ETOH   TOBACCO ABUSE 12/23/2008   TRIGGER FINGER 05/05/2007   Tuberculosis    pos PPD    Past Surgical History:  Procedure Laterality Date   CHEST TUBE INSERTION  04/03/2012   Procedure: CHEST TUBE INSERTION;  Surgeon: Dann FORBES Hummer, MD;  Location: [facility] OR;  Service: General;  Laterality: Left;   CHOLECYSTECTOMY  07/28/2012   Procedure: LAPAROSCOPIC CHOLECYSTECTOMY;  Surgeon: Vicenta DELENA Poli, MD;  Location: [facility];  Service: General;  Laterality: N/A;   EXTERNAL FIXATION LEG  04/03/2012   Procedure:  EXTERNAL FIXATION LEG;  Surgeon: Ozell VEAR Bruch, MD;  Location: [facility] OR;  Service: Orthopedics;  Laterality: Left;  Left femur   EXTERNAL FIXATION PELVIS  04/03/2012   Procedure: EXTERNAL FIXATION PELVIS;  Surgeon: Ozell VEAR Bruch, MD;  Location: [facility] OR;  Service: Orthopedics;;   FEMUR IM NAIL  04/07/2012   Procedure: INTRAMEDULLARY (IM) NAIL FEMORAL;  Surgeon: Ozell VEAR Bruch, MD;  Location: [facility] OR;  Service: Orthopedics;  Laterality: Left;   FLEXIBLE BRONCHOSCOPY  04/07/2012   Procedure: FLEXIBLE BRONCHOSCOPY;  Surgeon: Dann FORBES Hummer, MD;  Location: [facility] OR;  Service: General;;  START TIME=1645 END TIME=1700   HARDWARE REMOVAL Left 04/01/2022   Procedure: LEFT HIP REMOVE SCREWS AND RETAINED FEMORAL NAIL;  Surgeon:  Liam Lerner, MD;  Location: [facility];  Service: Orthopedics;  Laterality: Left;   INCISION AND DRAINAGE OF WOUND  04/03/2012   Procedure: IRRIGATION AND DEBRIDEMENT WOUND;  Surgeon: Lynwood JONELLE Mill, MD;  Location[facility];  Service: Neurosurgery;  Laterality: N/A;  Frontal.   ORIF PATELLA  04/07/2012   Procedure: OPEN REDUCTION INTERNAL (ORIF) FIXATION PATELLA;  Surgeon: Ozell VEAR Bruch, MD;  Location: [facility] OR;  Service: Orthopedics;  Laterality: Left;   ORIF PELVIC FRACTURE  04/07/2012   Procedure: OPEN REDUCTION INTERNAL FIXATION (ORIF) PELVIC FRACTURE;  Surgeon: Ozell VEAR Bruch, MD;  Location: [facility] OR;  Service: Orthopedics;  Laterality: N/A;  Right and left sacroiliac screw pinning,Irrigation and debridebridement open tibia and femur,removal external fixator.   TIBIA IM NAIL INSERTION  04/07/2012   Procedure: INTRAMEDULLARY (IM) NAIL TIBIAL;  Surgeon: Ozell VEAR Bruch, MD;  Location: [facility] OR;  Service: Orthopedics;  Laterality: Left;   TOTAL HIP ARTHROPLASTY Left 07/29/2022   Procedure: LEFT TOTAL HIP ARTHROPLASTY ANTERIOR APPROACH;  Surgeon: Liam Lerner, MD;  Location: [facility];  Service: Orthopedics;  Laterality: Left;      Current Outpatient Medications:    chlorthalidone  (HYGROTON ) 25 MG tablet, Take 1 tablet (25 mg total) by mouth daily., Disp: 90 tablet, Rfl: 3   cholecalciferol  (VITAMIN D ) 1000 UNITS tablet, Take 1,000 Units by mouth at bedtime. , Disp: , Rfl:    ciprofloxacin  (CIPRO ) 500 MG tablet, Take 1 tablet (500 mg total) by mouth every 12 (twelve) hours for 10 days, Disp: 20 tablet, Rfl: 0   clotrimazole -betamethasone  (LOTRISONE ) cream, Apply topically 2 (two) times daily., Disp: 15 g, Rfl: 1   diclofenac Sodium (VOLTAREN) 1 % GEL, Apply 2 g topically daily as needed (pain)., Disp: , Rfl:    diphenhydrAMINE  (BENADRYL ) 25 MG tablet, Take 25 mg by mouth every 6 (six) hours as needed for allergies., Disp: , Rfl:    doxycycline  (VIBRAMYCIN ) 100 MG capsule, Take 1 capsule (100 mg total) by mouth 2  (two) times daily., Disp: 20 capsule, Rfl: 0   fluticasone  (FLONASE ) 50 MCG/ACT nasal spray, Place 2 sprays into both nostrils daily as needed for allergies., Disp: , Rfl:    gabapentin  (NEURONTIN ) 300 MG capsule, Take 3 capsules (900 mg total) by mouth 3 (three) times daily., Disp: 810 capsule, Rfl: 0   gabapentin  (NEURONTIN ) 300 MG capsule, Take 3 capsules (900 mg total) by mouth 3 (three) times daily., Disp: 810 capsule, Rfl: 0   gabapentin  (NEURONTIN ) 300 MG capsule, Take 3 capsules (900 mg total) by mouth 3 (three) times daily., Disp: 810 capsule, Rfl: 0   gabapentin  (NEURONTIN ) 300 MG capsule, Take 3 capsules (900 mg total) by mouth 3 (three) times daily., Disp: 810 capsule, Rfl: 0   [START ON  06/18/2024] gabapentin  (NEURONTIN ) 300 MG capsule, Take 3 capsules (900 mg total) by mouth 3 (three) times daily., Disp: 810 capsule, Rfl: 0   lisinopril  (ZESTRIL ) 10 MG tablet, Take 1 tablet (10 mg total) by mouth daily., Disp: 90 tablet, Rfl: 1   lisinopril  (ZESTRIL ) 20 MG tablet, Take 1 tablet (20 mg total) by mouth daily., Disp: 90 tablet, Rfl: 1   metFORMIN  (GLUCOPHAGE ) 1000 MG tablet, Take 1 tablet (1,000 mg total) by mouth 2 (two) times daily with a meal., Disp: 180 tablet, Rfl: 3   metFORMIN  (GLUCOPHAGE ) 1000 MG tablet, Take 1 tablet (1,000 mg total) by mouth 2 (two) times daily with a meal, Disp: 180 tablet, Rfl: 3   methocarbamol  (ROBAXIN ) 500 MG tablet, Take 1 tablet (500 mg total) by mouth 2 (two) times daily as needed for spasms, Disp: 180 tablet, Rfl: 1   NEEDLE, DISP, 18 G (BD DISP NEEDLES) 18G X 1-1/2 MISC, Use as directed to draw up medication, Disp: 90 each, Rfl: 11   NEEDLE, DISP, 22 G (BD DISP NEEDLES) 22G X 1-1/2 MISC, Use to inject medication as instructed, Disp: 90 each, Rfl: 11   nitrofurantoin , macrocrystal-monohydrate, (MACROBID ) 100 MG capsule, Take 1 capsule (100 mg total) by mouth 2 (two) times daily for 7 days, Disp: 14 capsule, Rfl: 0   oxyCODONE  (ROXICODONE ) 5 MG immediate  release tablet, Take 1 tablet (5 mg total) by mouth every 6 (six) hours as needed for up to 10 doses., Disp: 10 tablet, Rfl: 0   oxyCODONE -acetaminophen  (PERCOCET) 10-325 MG tablet, Take 1 tablet by mouth every 6-12 (six-twelve) hours as needed for chronic pain., Disp: 120 tablet, Rfl: 0   oxyCODONE -acetaminophen  (PERCOCET) 10-325 MG tablet, Take 1 tablet by mouth every 6-12 hours as needed for chronic pain,, Disp: 120 tablet, Rfl: 0   oxyCODONE -acetaminophen  (PERCOCET) 10-325 MG tablet, Take 1 tablet by mouth every 6-12 hours as needed for  chronic pain,, Disp: 120 tablet, Rfl: 0   oxyCODONE -acetaminophen  (PERCOCET) 10-325 MG tablet, Take 1 tablet by mouth every 6 to 12 hours as needed for chronic pain, Disp: 120 tablet, Rfl: 0   oxyCODONE -acetaminophen  (PERCOCET) 10-325 MG tablet, Take 1 tablet by mouth every 6 (six) to 12 (twelve) hours as needed for chronic pain., Disp: 120 tablet, Rfl: 0   oxyCODONE -acetaminophen  (PERCOCET) 10-325 MG tablet, Take 1 tablet by mouth every 6-12 hours as needed for chronic pain, Disp: 120 tablet, Rfl: 0   oxyCODONE -acetaminophen  (PERCOCET) 10-325 MG tablet, Take 1 tablet by mouth every 6-12 hours as needed for chronic pain, ., Disp: 120 tablet, Rfl: 0   oxyCODONE -acetaminophen  (PERCOCET) 10-325 MG tablet, Take 1 tablet by mouth every 6-12 hours as needed for chronic pain., Disp: 120 tablet, Rfl: 0   oxyCODONE -acetaminophen  (PERCOCET) 10-325 MG tablet, Take 1 tablet by mouth every 6 to 12 hours as needed for chronic pain, Disp: 120 tablet, Rfl: 0   [START ON 05/22/2024] oxyCODONE -acetaminophen  (PERCOCET) 10-325 MG tablet, Take 1 tablet by mouth every 6-12 hours for chronic pain. 05/22/24, Disp: 120 tablet, Rfl: 0   oxyCODONE -acetaminophen  (PERCOCET) 10-325 MG tablet, Take 1 tablet by mouth every 6-12 hours for chronic pain., Disp: 120 tablet, Rfl: 0   pantoprazole  (PROTONIX ) 40 MG tablet, Take 1 tablet (40 mg) by mouth 2 times daily, Disp: 60 tablet, Rfl: 3   potassium  chloride (KLOR-CON  M) 10 MEQ tablet, Take 1 tablet (10 mEq total) by mouth daily with food, Disp: 90 tablet, Rfl: 1   potassium chloride  (KLOR-CON ) 10 MEQ tablet,  Take 1 tablet (10 mEq total) by mouth 2 (two) times daily., Disp: 180 tablet, Rfl: 3   potassium chloride  (KLOR-CON ) 10 MEQ tablet, Take 1 tablet (10 mEq total) by mouth daily with food, Disp: 90 tablet, Rfl: 1   sildenafil (VIAGRA) 100 MG tablet, Take 100 mg by mouth daily as needed for erectile dysfunction., Disp: , Rfl:    Testosterone  (ANDROGEL  PUMP) 20.25 MG/ACT (1.62%) GEL, Apply 2 pumps to clean/intact skin on shoulder, upper arms, or stomach every morning, Disp: 88 g, Rfl: 3   testosterone  cypionate (DEPOTESTOSTERONE CYPIONATE) 200 MG/ML injection, Inject 0.5 mLs (100 mg total) into the skin once a week., Disp: 10 mL, Rfl: 0   tirzepatide  (MOUNJARO ) 15 MG/0.5ML Pen, Inject 15 mg into the skin once a week. Patient due for appointment, needs to call [phone] to schedule, Disp: 6 mL, Rfl: 0   albuterol  (VENTOLIN  HFA) 108 (90 Base) MCG/ACT inhaler, Inhale 1-2 puffs into the lungs every 6 (six) hours as needed for wheezing or shortness of breath. (Patient not taking: Reported on 05/06/2024), Disp: , Rfl:    B-D 3CC LUER-LOK SYR 18GX1-1/2 18G X 1-1/2 3 ML MISC, Inject 1 Syringe into the skin See admin instructions. (Patient not taking: Reported on 05/06/2024), Disp: , Rfl:    B-D 3CC LUER-LOK SYR 22GX1 22G X 1 3 ML MISC, See admin instructions. (Patient not taking: Reported on 05/06/2024), Disp: , Rfl:    Blood Pressure KIT, MONITOR BLOOD PRESSURE DAILY DX I10. (Patient not taking: Reported on 05/06/2024), Disp: 1 kit, Rfl: 0   pravastatin  (PRAVACHOL ) 20 MG tablet, Take 1 tablet (20 mg total) by mouth daily. (Patient not taking: Reported on 05/06/2024), Disp: 90 tablet, Rfl: 1   testosterone  cypionate (DEPOTESTOSTERONE CYPIONATE) 200 MG/ML injection, Inject 0.5 mLs (100 mg total) into the muscle every 14 (fourteen) days. **DISCARD 28 DAYS  AFTER FIRST USE.** (Patient not taking: Reported on 05/06/2024), Disp: 10 mL, Rfl: 1   Objective:   Vitals:   05/06/24 1356 05/06/24 1403  BP: (!) 84/50 (!) 90/59  Pulse: 88   SpO2: 98%     Physical Exam Vitals reviewed.  Constitutional:      Appearance: Normal appearance.  HENT:     Head: Atraumatic.  Cardiovascular:     Rate and Rhythm: Normal rate.     Pulses: Normal pulses.  Pulmonary:     Effort: Pulmonary effort is normal.  Abdominal:     General: There is no distension.     Palpations: Abdomen is soft.     Tenderness: There is no abdominal tenderness.  Musculoskeletal:        General: No swelling or deformity.  Skin:    Coloration: Skin is not jaundiced.     Findings: Rash present. No bruising or lesion.  Neurological:     Mental Status: He is alert and oriented to person, place, and time.  Psychiatric:        Mood and Affect: Mood normal.        Behavior: Behavior normal.        Thought Content: Thought content normal.        Judgment: Judgment normal.     Assessment & Plan:  Chronic pain syndrome  Panniculitis  Chronic bilateral thoracic back pain  The patient is a candidate for panniculectomy.  He seems to understand the risks and complications.  He is at a very high risk of skin breakdown and slow healing due to his comorbidities.  Removing the excess tissue  would certainly be of assistance.  I have requested a preop clearance from his pain doc and his PCP.  Pictures were obtained of the patient and placed in the chart with the patient's or guardian's permission.   Estefana RAMAN Areana Kosanke, DO

## 2024-05-07 ENCOUNTER — Other Ambulatory Visit (HOSPITAL_COMMUNITY): Payer: Self-pay

## 2024-05-07 ENCOUNTER — Telehealth: Payer: Self-pay

## 2024-05-07 NOTE — Telephone Encounter (Signed)
 Sent surgical clearance form to Dr. Elfrieda Koirala [phone]) PCP. Confirmation of receipt.

## 2024-05-08 ENCOUNTER — Other Ambulatory Visit (HOSPITAL_COMMUNITY): Payer: Self-pay

## 2024-05-10 ENCOUNTER — Ambulatory Visit
Admission: RE | Admit: 2024-05-10 | Discharge: 2024-05-10 | Disposition: A | Discharge status: Home / Self Care | Source: Ambulatory Visit | Attending: Family Medicine | Admitting: Family Medicine

## 2024-05-10 DIAGNOSIS — N289 Disorder of kidney and ureter, unspecified: Secondary | ICD-10-CM

## 2024-05-24 ENCOUNTER — Other Ambulatory Visit: Payer: Self-pay

## 2024-05-24 ENCOUNTER — Other Ambulatory Visit (HOSPITAL_BASED_OUTPATIENT_CLINIC_OR_DEPARTMENT_OTHER): Payer: Self-pay

## 2024-05-24 ENCOUNTER — Other Ambulatory Visit (HOSPITAL_COMMUNITY): Payer: Self-pay

## 2024-05-25 ENCOUNTER — Other Ambulatory Visit (HOSPITAL_BASED_OUTPATIENT_CLINIC_OR_DEPARTMENT_OTHER): Payer: Self-pay

## 2024-05-25 ENCOUNTER — Other Ambulatory Visit: Payer: Self-pay

## 2024-05-28 ENCOUNTER — Ambulatory Visit: Admitting: Podiatry

## 2024-05-28 DIAGNOSIS — L600 Ingrowing nail: Secondary | ICD-10-CM | POA: Diagnosis not present

## 2024-05-28 NOTE — Progress Notes (Signed)
 Subjective:  Patient ID: Miguel Brady, male    DOB: [birth year],  MRN: [id number]  Chief Complaint  Patient presents with   Nail Problem    63 y.o. male presents with the above complaint.  Patient presents with right hallux medial border ingrown painful to touch is progressing and worse worse with ambulation and shoe pressure has not seen anyone as part of same he denies any other acute complaints pain scale 7 out of 10 dull aching nature.   Review of Systems: Negative except as noted in the HPI. Denies N/V/F/Ch.  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  no inhaler   Substance abuse (HCC)    ETOH   TOBACCO ABUSE 12/23/2008   TRIGGER FINGER 05/05/2007   Tuberculosis    pos PPD    Current Outpatient Medications:    albuterol  (VENTOLIN  HFA) 108 (90 Base) MCG/ACT inhaler, Inhale 1-2 puffs into the lungs every 6 (six) hours as needed for wheezing or shortness of breath. (Patient not taking: Reported on 05/06/2024), Disp: , Rfl:    B-D 3CC LUER-LOK SYR 18GX1-1/2 18G X 1-1/2 3 ML MISC, Inject 1 Syringe into the skin See admin instructions. (Patient not taking: Reported on 05/06/2024), Disp: , Rfl:    B-D 3CC LUER-LOK SYR 22GX1 22G X 1 3 ML MISC, See admin instructions. (Patient not taking: Reported on 05/06/2024), Disp: , Rfl:    Blood  Pressure KIT, MONITOR BLOOD PRESSURE DAILY DX I10. (Patient not taking: Reported on 05/06/2024), Disp: 1 kit, Rfl: 0   chlorthalidone  (HYGROTON ) 25 MG tablet, Take 1 tablet (25 mg total) by mouth daily., Disp: 90 tablet, Rfl: 3   cholecalciferol  (VITAMIN D ) 1000 UNITS tablet, Take 1,000 Units by mouth at bedtime. , Disp: , Rfl:    ciprofloxacin  (CIPRO ) 500 MG tablet, Take 1 tablet (500 mg total) by mouth every 12 (twelve) hours for 10 days, Disp: 20 tablet, Rfl: 0   clotrimazole -betamethasone  (LOTRISONE ) cream, Apply topically 2 (two) times daily., Disp: 15 g, Rfl: 1   diclofenac Sodium (VOLTAREN) 1 % GEL, Apply 2 g topically daily as needed (pain)., Disp: , Rfl:    diphenhydrAMINE  (BENADRYL ) 25 MG tablet, Take 25 mg by mouth every 6 (six) hours as needed for allergies., Disp: , Rfl:    doxycycline  (VIBRAMYCIN ) 100 MG capsule, Take 1 capsule (100 mg total) by mouth 2 (two) times daily., Disp: 20 capsule, Rfl: 0   fluticasone  (FLONASE ) 50 MCG/ACT nasal spray, Place 2 sprays into both nostrils daily as needed for allergies., Disp: , Rfl:    gabapentin  (NEURONTIN ) 300 MG capsule, Take 3 capsules (900 mg total) by mouth 3 (three) times daily., Disp: 810 capsule, Rfl: 0   gabapentin  (NEURONTIN ) 300 MG capsule, Take 3 capsules (900 mg total) by mouth 3 (three) times daily., Disp: 810  capsule, Rfl: 0   gabapentin  (NEURONTIN ) 300 MG capsule, Take 3 capsules (900 mg total) by mouth 3 (three) times daily., Disp: 810 capsule, Rfl: 0   gabapentin  (NEURONTIN ) 300 MG capsule, Take 3 capsules (900 mg total) by mouth 3 (three) times daily., Disp: 810 capsule, Rfl: 0   [START ON 06/18/2024] gabapentin  (NEURONTIN ) 300 MG capsule, Take 3 capsules (900 mg total) by mouth 3 (three) times daily., Disp: 810 capsule, Rfl: 0   lisinopril  (ZESTRIL ) 10 MG tablet, Take 1 tablet (10 mg total) by mouth daily., Disp: 90 tablet, Rfl: 1   lisinopril  (ZESTRIL ) 20 MG tablet, Take 1 tablet (20 mg total) by mouth daily., Disp: 90 tablet,  Rfl: 1   metFORMIN  (GLUCOPHAGE ) 1000 MG tablet, Take 1 tablet (1,000 mg total) by mouth 2 (two) times daily with a meal., Disp: 180 tablet, Rfl: 3   metFORMIN  (GLUCOPHAGE ) 1000 MG tablet, Take 1 tablet (1,000 mg total) by mouth 2 (two) times daily with a meal, Disp: 180 tablet, Rfl: 3   methocarbamol  (ROBAXIN ) 500 MG tablet, Take 1 tablet (500 mg total) by mouth 2 (two) times daily as needed for spasms, Disp: 180 tablet, Rfl: 1   NEEDLE, DISP, 18 G (BD DISP NEEDLES) 18G X 1-1/2 MISC, Use as directed to draw up medication, Disp: 90 each, Rfl: 11   NEEDLE, DISP, 22 G (BD DISP NEEDLES) 22G X 1-1/2 MISC, Use to inject medication as instructed, Disp: 90 each, Rfl: 11   nitrofurantoin , macrocrystal-monohydrate, (MACROBID ) 100 MG capsule, Take 1 capsule (100 mg total) by mouth 2 (two) times daily for 7 days, Disp: 14 capsule, Rfl: 0   oxyCODONE  (ROXICODONE ) 5 MG immediate release tablet, Take 1 tablet (5 mg total) by mouth every 6 (six) hours as needed for up to 10 doses., Disp: 10 tablet, Rfl: 0   oxyCODONE -acetaminophen  (PERCOCET) 10-325 MG tablet, Take 1 tablet by mouth every 6-12 (six-twelve) hours as needed for chronic pain., Disp: 120 tablet, Rfl: 0   oxyCODONE -acetaminophen  (PERCOCET) 10-325 MG tablet, Take 1 tablet by mouth every 6-12 hours as needed for chronic pain,, Disp: 120 tablet, Rfl: 0   oxyCODONE -acetaminophen  (PERCOCET) 10-325 MG tablet, Take 1 tablet by mouth every 6-12 hours as needed for  chronic pain,, Disp: 120 tablet, Rfl: 0   oxyCODONE -acetaminophen  (PERCOCET) 10-325 MG tablet, Take 1 tablet by mouth every 6 to 12 hours as needed for chronic pain, Disp: 120 tablet, Rfl: 0   oxyCODONE -acetaminophen  (PERCOCET) 10-325 MG tablet, Take 1 tablet by mouth every 6 (six) to 12 (twelve) hours as needed for chronic pain., Disp: 120 tablet, Rfl: 0   oxyCODONE -acetaminophen  (PERCOCET) 10-325 MG tablet, Take 1 tablet by mouth every 6-12 hours as needed for chronic pain, Disp: 120 tablet, Rfl: 0    oxyCODONE -acetaminophen  (PERCOCET) 10-325 MG tablet, Take 1 tablet by mouth every 6-12 hours as needed for chronic pain, ., Disp: 120 tablet, Rfl: 0   oxyCODONE -acetaminophen  (PERCOCET) 10-325 MG tablet, Take 1 tablet by mouth every 6-12 hours as needed for chronic pain., Disp: 120 tablet, Rfl: 0   oxyCODONE -acetaminophen  (PERCOCET) 10-325 MG tablet, Take 1 tablet by mouth every 6 to 12 hours as needed for chronic pain, Disp: 120 tablet, Rfl: 0   oxyCODONE -acetaminophen  (PERCOCET) 10-325 MG tablet, Take 1 tablet by mouth every 6-12 hours as needed for chronic pain. 05/22/24, Disp: 120 tablet, Rfl: 0   oxyCODONE -acetaminophen  (PERCOCET) 10-325 MG tablet, Take 1 tablet by mouth every 6-12 hours for chronic pain., Disp: 120 tablet, Rfl: 0  pantoprazole  (PROTONIX ) 40 MG tablet, Take 1 tablet (40 mg) by mouth 2 times daily, Disp: 60 tablet, Rfl: 3   potassium chloride  (KLOR-CON  M) 10 MEQ tablet, Take 1 tablet (10 mEq total) by mouth daily with food, Disp: 90 tablet, Rfl: 1   potassium chloride  (KLOR-CON ) 10 MEQ tablet, Take 1 tablet (10 mEq total) by mouth 2 (two) times daily., Disp: 180 tablet, Rfl: 3   potassium chloride  (KLOR-CON ) 10 MEQ tablet, Take 1 tablet (10 mEq total) by mouth daily with food, Disp: 90 tablet, Rfl: 1   pravastatin  (PRAVACHOL ) 20 MG tablet, Take 1 tablet (20 mg total) by mouth daily. (Patient not taking: Reported on 05/06/2024), Disp: 90 tablet, Rfl: 1   sildenafil (VIAGRA) 100 MG tablet, Take 100 mg by mouth daily as needed for erectile dysfunction., Disp: , Rfl:    Testosterone  (ANDROGEL  PUMP) 20.25 MG/ACT (1.62%) GEL, Apply 2 pumps to clean/intact skin on shoulder, upper arms, or stomach every morning, Disp: 88 g, Rfl: 3   testosterone  cypionate (DEPOTESTOSTERONE CYPIONATE) 200 MG/ML injection, Inject 0.5 mLs (100 mg total) into the muscle every 14 (fourteen) days. **DISCARD 28 DAYS AFTER FIRST USE.** (Patient not taking: Reported on 05/06/2024), Disp: 10 mL, Rfl: 1   testosterone   cypionate (DEPOTESTOSTERONE CYPIONATE) 200 MG/ML injection, Inject 0.5 mLs (100 mg total) into the skin once a week., Disp: 10 mL, Rfl: 0   tirzepatide  (MOUNJARO ) 15 MG/0.5ML Pen, Inject 15 mg into the skin once a week. Patient due for appointment, needs to call [phone] to schedule, Disp: 6 mL, Rfl: 0  Social History   Tobacco Use  Smoking Status Former   Current packs/day: 0.50   Average packs/day: 0.5 packs/day for 40.0 years (20.0 ttl pk-yrs)   Types: Cigarettes   Start date: 06/25/2020  Smokeless Tobacco Never    Allergies  Allergen Reactions   Shellfish Allergy Anaphylaxis   Iodine  Rash   Penicillins Itching    Has patient had a PCN reaction causing immediate rash, facial/tongue/throat swelling, SOB or lightheadedness with hypotension: No Has patient had a PCN reaction causing severe rash involving mucus membranes or skin necrosis: No Has patient had a PCN reaction that required hospitalization No Has patient had a PCN reaction occurring within the last 10 years: No If all of the above answers are NO, then may proceed with Cephalosporin use.   Objective:  There were no vitals filed for this visit. There is no height or weight on file to calculate BMI. Constitutional Well developed. Well nourished.  Vascular Dorsalis pedis pulses palpable bilaterally. Posterior tibial pulses palpable bilaterally. Capillary refill normal to all digits.  No cyanosis or clubbing noted. Pedal hair growth normal.  Neurologic Normal speech. Oriented to person, place, and time. Epicritic sensation to light touch grossly present bilaterally.  Dermatologic Painful ingrowing nail at medial nail borders of the hallux nail right. No other open wounds. No skin lesions.  Orthopedic: Normal joint ROM without pain or crepitus bilaterally. No visible deformities. No bony tenderness.   Radiographs: None Assessment:   1. Ingrown toenail of right foot    Plan:  Patient was evaluated and  treated and all questions answered.  Ingrown Nail, right -Patient elects to proceed with minor surgery to remove ingrown toenail removal today. Consent reviewed and signed by patient. -Ingrown nail excised. See procedure note. -Educated on post-procedure care including soaking. Written instructions provided and reviewed. -Patient to follow up in 2 weeks for nail check.  Procedure: Excision of Ingrown Toenail Location: Right 1st  toe medial nail borders. Anesthesia: Lidocaine  1% plain; 1.5 mL and Marcaine  0.5% plain; 1.5 mL, digital block. Skin Prep: Betadine . Dressing: Silvadene; telfa; dry, sterile, compression dressing. Technique: Following skin prep, the toe was exsanguinated and a tourniquet was secured at the base of the toe. The affected nail border was freed, split with a nail splitter, and excised. Chemical matrixectomy was then performed with phenol and irrigated out with alcohol. The tourniquet was then removed and sterile dressing applied. Disposition: Patient tolerated procedure well. Patient to return in 2 weeks for follow-up.   No follow-ups on file.

## 2024-05-28 NOTE — Patient Instructions (Addendum)
 Long Term Care Instructions-Post Nail Surgery  You have had your ingrown toenail and root treated with a chemical.  This chemical causes a burn that will drain and ooze like a blister.  This can drain for 6-8 weeks or longer.  It is important to keep this area clean, covered, and follow the soaking instructions dispensed at the time of your surgery.  This area will eventually dry and form a scab.  Once the scab forms you no longer need to soak or apply a dressing.  If at any time you experience an increase in pain, redness, swelling, or drainage, you should contact the office as soon as possible.  Place 1/4 cup of epsom salts in a quart of warm tap water.  Submerge your foot or feet in the solution and soak for 20 minutes.  This soak should be done twice a day.  Next, remove your foot or feet from solution, blot dry the affected area. Apply ointment and cover if instructed by your doctor.   IF YOUR SKIN BECOMES IRRITATED WHILE USING THESE INSTRUCTIONS, IT IS OKAY TO SWITCH TO  WHITE VINEGAR AND WATER.  As another alternative soak, you may use antibacterial soap and water.  Monitor for any signs/symptoms of infection. Call the office immediately if any occur or go directly to the emergency room. Call with any questions/concerns.

## 2024-06-11 ENCOUNTER — Other Ambulatory Visit (HOSPITAL_COMMUNITY): Payer: Self-pay

## 2024-06-11 ENCOUNTER — Other Ambulatory Visit (HOSPITAL_BASED_OUTPATIENT_CLINIC_OR_DEPARTMENT_OTHER): Payer: Self-pay | Admitting: Cardiovascular Disease

## 2024-06-11 DIAGNOSIS — E119 Type 2 diabetes mellitus without complications: Secondary | ICD-10-CM

## 2024-06-12 ENCOUNTER — Other Ambulatory Visit (HOSPITAL_COMMUNITY): Payer: Self-pay

## 2024-06-14 ENCOUNTER — Other Ambulatory Visit (HOSPITAL_COMMUNITY): Payer: Self-pay

## 2024-06-14 MED ORDER — MOUNJARO 15 MG/0.5ML ~~LOC~~ SOAJ
15.0000 mg | SUBCUTANEOUS | 0 refills | Status: DC
  Filled 2024-06-14: qty 2, 28d supply, fill #0

## 2024-06-22 ENCOUNTER — Other Ambulatory Visit (HOSPITAL_COMMUNITY): Payer: Self-pay

## 2024-06-22 MED ORDER — OXYCODONE-ACETAMINOPHEN 10-325 MG PO TABS
1.0000 | ORAL_TABLET | Freq: Four times a day (QID) | ORAL | 0 refills | Status: DC
  Filled 2024-07-24 – 2024-07-27 (×2): qty 120, 30d supply, fill #0

## 2024-06-22 MED ORDER — OXYCODONE-ACETAMINOPHEN 10-325 MG PO TABS
1.0000 | ORAL_TABLET | ORAL | 0 refills | Status: DC
  Filled 2024-06-23: qty 120, 30d supply, fill #0

## 2024-06-23 ENCOUNTER — Other Ambulatory Visit (HOSPITAL_COMMUNITY): Payer: Self-pay

## 2024-06-23 ENCOUNTER — Other Ambulatory Visit: Payer: Self-pay

## 2024-06-23 ENCOUNTER — Other Ambulatory Visit (HOSPITAL_BASED_OUTPATIENT_CLINIC_OR_DEPARTMENT_OTHER): Payer: Self-pay

## 2024-06-24 ENCOUNTER — Other Ambulatory Visit (HOSPITAL_BASED_OUTPATIENT_CLINIC_OR_DEPARTMENT_OTHER): Payer: Self-pay | Admitting: Cardiovascular Disease

## 2024-06-24 ENCOUNTER — Telehealth: Payer: Self-pay | Admitting: Plastic Surgery

## 2024-06-24 ENCOUNTER — Other Ambulatory Visit (HOSPITAL_COMMUNITY): Payer: Self-pay

## 2024-06-24 DIAGNOSIS — E119 Type 2 diabetes mellitus without complications: Secondary | ICD-10-CM

## 2024-06-24 MED ORDER — MOUNJARO 15 MG/0.5ML ~~LOC~~ SOAJ
15.0000 mg | SUBCUTANEOUS | 3 refills | Status: DC
  Filled 2024-06-24 – 2024-07-07 (×2): qty 6, 84d supply, fill #0

## 2024-06-24 NOTE — Telephone Encounter (Signed)
Patient to schedule surgery.

## 2024-06-24 NOTE — Telephone Encounter (Signed)
 errr

## 2024-06-25 ENCOUNTER — Telehealth: Payer: Self-pay

## 2024-06-25 NOTE — Telephone Encounter (Signed)
   Pre-operative Risk Assessment    Patient Name: Miguel Brady  DOB: September 24, 1960 MRN: [id number]   Date of last office visit: 07/18/23- Dr. Annabella Scarce MD Date of next office visit: 09/30/2024- Dr. Annabella Scarce MD   Request for Surgical Clearance    Procedure:  Panniculectomy with liposuction   Date of Surgery:  Clearance TBD                                Surgeon:  Dr. Estefana Fritter Surgeon's Group or Practice Name:  [facility]  Phone number:  [phone] Fax number:  [phone]   Type of Clearance Requested:   - Medical    Type of Anesthesia:  General    Additional requests/questions:    SignedJolan Prosper Paff   06/25/2024, 12:07 PM

## 2024-06-25 NOTE — Telephone Encounter (Signed)
 Surgical clearance faxed to Dr. Annabella Scarce [phone]) for cardiac clearance. Confirmation of receipt.

## 2024-06-25 NOTE — Telephone Encounter (Signed)
 Called patient to set up an televisit appointment for per-op clearance. Patient was a little agitated, and mention he wanted a office visit instead, informed him, he has a f/u with Dr. Raford on 09/30/24 @ 11:00 and she could do it that day. Patient agreed and understood.

## 2024-06-25 NOTE — Telephone Encounter (Signed)
   Name: Miguel Brady  DOB: 01/09/1961  MRN: [id number]  Primary Cardiologist: Annabella Scarce, MD   Preoperative team, please contact this patient and set up a phone call appointment for further preoperative risk assessment. Please obtain consent and complete medication review. Thank you for your help.  I confirm that guidance regarding antiplatelet and oral anticoagulation therapy has been completed and, if necessary, noted below.  None requested.  I also confirmed the patient resides in the state of [place] . As per [facility] telemedicine laws, the patient must reside in the state in which the provider is licensed.   [name], NP 06/25/2024, 12:58 PM [facility]

## 2024-06-30 ENCOUNTER — Other Ambulatory Visit (HOSPITAL_COMMUNITY): Payer: Self-pay

## 2024-07-07 ENCOUNTER — Other Ambulatory Visit (HOSPITAL_COMMUNITY): Payer: Self-pay

## 2024-07-08 ENCOUNTER — Other Ambulatory Visit: Payer: Self-pay

## 2024-07-08 ENCOUNTER — Other Ambulatory Visit (HOSPITAL_COMMUNITY): Payer: Self-pay

## 2024-07-13 ENCOUNTER — Other Ambulatory Visit: Payer: Self-pay

## 2024-07-24 ENCOUNTER — Other Ambulatory Visit (HOSPITAL_BASED_OUTPATIENT_CLINIC_OR_DEPARTMENT_OTHER): Payer: Self-pay

## 2024-07-24 ENCOUNTER — Other Ambulatory Visit (HOSPITAL_COMMUNITY): Payer: Self-pay

## 2024-07-25 ENCOUNTER — Other Ambulatory Visit (HOSPITAL_BASED_OUTPATIENT_CLINIC_OR_DEPARTMENT_OTHER): Payer: Self-pay | Admitting: Cardiovascular Disease

## 2024-07-26 ENCOUNTER — Other Ambulatory Visit (HOSPITAL_BASED_OUTPATIENT_CLINIC_OR_DEPARTMENT_OTHER): Payer: Self-pay

## 2024-07-26 ENCOUNTER — Other Ambulatory Visit: Payer: Self-pay

## 2024-07-26 ENCOUNTER — Other Ambulatory Visit (HOSPITAL_COMMUNITY): Payer: Self-pay

## 2024-07-27 ENCOUNTER — Other Ambulatory Visit (HOSPITAL_BASED_OUTPATIENT_CLINIC_OR_DEPARTMENT_OTHER): Payer: Self-pay

## 2024-07-28 ENCOUNTER — Other Ambulatory Visit (HOSPITAL_COMMUNITY): Payer: Self-pay

## 2024-07-28 MED ORDER — TESTOSTERONE CYPIONATE 200 MG/ML IM SOLN
100.0000 mg | INTRAMUSCULAR | 0 refills | Status: AC
  Filled 2024-07-28: qty 4, 28d supply, fill #0

## 2024-08-05 ENCOUNTER — Other Ambulatory Visit: Payer: Self-pay

## 2024-08-05 NOTE — Patient Outreach (Signed)
 Aging Gracefully Program  08/05/2024  Miguel Brady [birth year] [id number]   [facility] Evaluation Interviewer made contact with patient. Aging Gracefully 9 month survey completed.    [facility]  Population Health Care Management Assistant  Direct Dial: [phone]  Fax: [phone] Website: [email or web address]

## 2024-08-05 NOTE — Patient Outreach (Signed)
 Aging Gracefully Program  08/05/2024  Miguel Brady 02/24/1961 [id number]   [facility] Evaluation Interviewer attempted to call patient on today regarding Aging Gracefully referral. No answer from patient after multiple rings. CMA unable to leave confidential voicemail for patient to return call.  Will attempt to call back within 1 week.    [facility]  Population Health Care Management Assistant  Direct Dial: [phone]  Fax: [phone] Website: [email or web address]

## 2024-08-09 ENCOUNTER — Other Ambulatory Visit: Payer: Self-pay

## 2024-08-19 ENCOUNTER — Other Ambulatory Visit (HOSPITAL_COMMUNITY): Payer: Self-pay

## 2024-08-25 ENCOUNTER — Other Ambulatory Visit (HOSPITAL_COMMUNITY): Payer: Self-pay

## 2024-08-25 ENCOUNTER — Other Ambulatory Visit (HOSPITAL_BASED_OUTPATIENT_CLINIC_OR_DEPARTMENT_OTHER): Payer: Self-pay

## 2024-08-25 MED ORDER — OXYCODONE-ACETAMINOPHEN 10-325 MG PO TABS
1.0000 | ORAL_TABLET | Freq: Four times a day (QID) | ORAL | 0 refills | Status: AC | PRN
  Filled 2024-08-26: qty 120, 30d supply, fill #0

## 2024-08-25 MED ORDER — GABAPENTIN 300 MG PO CAPS: 900.0000 mg | ORAL_CAPSULE | Freq: Three times a day (TID) | ORAL | 0 refills | Status: DC

## 2024-08-25 MED ORDER — OXYCODONE-ACETAMINOPHEN 10-325 MG PO TABS
1.0000 | ORAL_TABLET | Freq: Four times a day (QID) | ORAL | 0 refills | Status: AC | PRN
  Filled 2024-09-25 – 2024-09-30 (×3): qty 120, 30d supply, fill #0

## 2024-08-26 ENCOUNTER — Other Ambulatory Visit (HOSPITAL_COMMUNITY): Payer: Self-pay

## 2024-08-27 ENCOUNTER — Other Ambulatory Visit (HOSPITAL_COMMUNITY): Payer: Self-pay

## 2024-08-31 ENCOUNTER — Other Ambulatory Visit (HOSPITAL_COMMUNITY): Payer: Self-pay

## 2024-09-08 ENCOUNTER — Other Ambulatory Visit (HOSPITAL_COMMUNITY): Payer: Self-pay

## 2024-09-15 ENCOUNTER — Other Ambulatory Visit (HOSPITAL_COMMUNITY): Payer: Self-pay

## 2024-09-25 ENCOUNTER — Other Ambulatory Visit (HOSPITAL_COMMUNITY): Payer: Self-pay

## 2024-09-27 ENCOUNTER — Other Ambulatory Visit (HOSPITAL_COMMUNITY): Payer: Self-pay

## 2024-09-27 MED ORDER — PANTOPRAZOLE SODIUM 40 MG PO TBEC
40.0000 mg | DELAYED_RELEASE_TABLET | Freq: Two times a day (BID) | ORAL | 3 refills | Status: AC
  Filled 2024-09-27: qty 60, 30d supply, fill #0

## 2024-09-27 MED ORDER — POTASSIUM CHLORIDE CRYS ER 10 MEQ PO TBCR
10.0000 meq | EXTENDED_RELEASE_TABLET | Freq: Every day | ORAL | 1 refills | Status: AC
  Filled 2024-09-27: qty 90, 90d supply, fill #0

## 2024-09-27 MED ORDER — LISINOPRIL 10 MG PO TABS
10.0000 mg | ORAL_TABLET | Freq: Every day | ORAL | 1 refills | Status: AC
  Filled 2024-09-27: qty 90, 90d supply, fill #0

## 2024-09-28 ENCOUNTER — Other Ambulatory Visit (HOSPITAL_COMMUNITY): Payer: Self-pay

## 2024-09-29 ENCOUNTER — Other Ambulatory Visit: Payer: Self-pay

## 2024-09-30 ENCOUNTER — Other Ambulatory Visit (HOSPITAL_COMMUNITY): Payer: Self-pay

## 2024-09-30 ENCOUNTER — Ambulatory Visit (HOSPITAL_BASED_OUTPATIENT_CLINIC_OR_DEPARTMENT_OTHER): Admitting: Cardiovascular Disease

## 2024-09-30 ENCOUNTER — Telehealth (HOSPITAL_COMMUNITY): Payer: Self-pay | Admitting: *Deleted

## 2024-09-30 ENCOUNTER — Other Ambulatory Visit (HOSPITAL_BASED_OUTPATIENT_CLINIC_OR_DEPARTMENT_OTHER): Payer: Self-pay

## 2024-09-30 ENCOUNTER — Other Ambulatory Visit (HOSPITAL_BASED_OUTPATIENT_CLINIC_OR_DEPARTMENT_OTHER): Payer: Self-pay | Admitting: Cardiovascular Disease

## 2024-09-30 ENCOUNTER — Encounter (HOSPITAL_BASED_OUTPATIENT_CLINIC_OR_DEPARTMENT_OTHER): Payer: Self-pay | Admitting: Cardiovascular Disease

## 2024-09-30 VITALS — BP 108/68 | HR 69 | Ht 72.0 in | Wt 244.0 lb

## 2024-09-30 DIAGNOSIS — E119 Type 2 diabetes mellitus without complications: Secondary | ICD-10-CM

## 2024-09-30 DIAGNOSIS — Z5181 Encounter for therapeutic drug level monitoring: Secondary | ICD-10-CM | POA: Diagnosis not present

## 2024-09-30 DIAGNOSIS — R0602 Shortness of breath: Secondary | ICD-10-CM | POA: Diagnosis not present

## 2024-09-30 DIAGNOSIS — R079 Chest pain, unspecified: Secondary | ICD-10-CM

## 2024-09-30 DIAGNOSIS — I10 Essential (primary) hypertension: Secondary | ICD-10-CM | POA: Diagnosis not present

## 2024-09-30 DIAGNOSIS — G4733 Obstructive sleep apnea (adult) (pediatric): Secondary | ICD-10-CM

## 2024-09-30 LAB — COMPREHENSIVE METABOLIC PANEL WITH GFR
ALT: 8 IU/L (ref 0–44)
AST: 14 IU/L (ref 0–40)
Albumin: 4 g/dL (ref 3.9–4.9)
Alkaline Phosphatase: 77 IU/L (ref 47–123)
BUN/Creatinine Ratio: 14 (ref 10–24)
BUN: 23 mg/dL (ref 8–27)
Bilirubin Total: 0.7 mg/dL (ref 0.0–1.2)
CO2: 24 mmol/L (ref 20–29)
Calcium: 9.1 mg/dL (ref 8.6–10.2)
Chloride: 102 mmol/L (ref 96–106)
Creatinine, Ser: 1.62 mg/dL — ABNORMAL HIGH (ref 0.76–1.27)
Globulin, Total: 3.7 g/dL (ref 1.5–4.5)
Glucose: 65 mg/dL — ABNORMAL LOW (ref 70–99)
Potassium: 3.9 mmol/L (ref 3.5–5.2)
Sodium: 141 mmol/L (ref 134–144)
Total Protein: 7.7 g/dL (ref 6.0–8.5)
eGFR: 47 mL/min/1.73 — ABNORMAL LOW

## 2024-09-30 LAB — LIPID PANEL
Chol/HDL Ratio: 2.7 ratio (ref 0.0–5.0)
Cholesterol, Total: 165 mg/dL (ref 100–199)
HDL: 62 mg/dL
LDL Chol Calc (NIH): 94 mg/dL (ref 0–99)
Triglycerides: 39 mg/dL (ref 0–149)
VLDL Cholesterol Cal: 9 mg/dL (ref 5–40)

## 2024-09-30 MED ORDER — ROSUVASTATIN CALCIUM 10 MG PO TABS
10.0000 mg | ORAL_TABLET | Freq: Every day | ORAL | 3 refills | Status: AC
  Filled 2024-09-30: qty 90, 90d supply, fill #0

## 2024-09-30 MED ORDER — GABAPENTIN 300 MG PO CAPS
900.0000 mg | ORAL_CAPSULE | Freq: Three times a day (TID) | ORAL | 0 refills | Status: AC
  Filled 2024-09-30: qty 270, 30d supply, fill #0

## 2024-09-30 MED ORDER — AMOXICILLIN 875 MG PO TABS
875.0000 mg | ORAL_TABLET | Freq: Three times a day (TID) | ORAL | 0 refills | Status: AC
  Filled 2024-09-30: qty 21, 7d supply, fill #0

## 2024-09-30 MED ORDER — MOUNJARO 15 MG/0.5ML ~~LOC~~ SOAJ
15.0000 mg | SUBCUTANEOUS | 3 refills | Status: AC
  Filled 2024-09-30 (×2): qty 6, 84d supply, fill #0

## 2024-09-30 NOTE — Telephone Encounter (Signed)
.  err

## 2024-09-30 NOTE — Telephone Encounter (Signed)
 Patient given detailed instructions per Myocardial Perfusion Study Information Sheet for the test on 10/04/2024 at 1:00. Patient notified to arrive 15 minutes early and that it is imperative to arrive on time for appointment to keep from having the test rescheduled.  If you need to cancel or reschedule your appointment, please call the office within 24 hours of your appointment. . Patient verbalized understanding.Miguel Brady
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
## 2024-09-30 NOTE — Patient Instructions (Addendum)
 Medication Instructions:  START ROSUVASTATIN  10 MG DAILY   STOP PRAVASTATIN    START AMOXIL  875 MG EVERY 8 HOURS FOR 7 DAYS   *If you need a refill on your cardiac medications before your next appointment, please call your pharmacy*  Lab Work: FASTING LP/CMET SOON   If you have labs (blood work) drawn today and your tests are completely normal, you will receive your results only by: MyChart Message (if you have MyChart) OR A paper copy in the mail If you have any lab test that is abnormal or we need to change your treatment, we will call you to review the results.  Testing/Procedures: Your physician has requested that you have a lexiscan  myoview . For further information please visit [email or web address] Please follow instruction sheet, as given.  Follow-Up: At [facility], you and your health needs are our priority.  As part of our continuing mission to provide you with exceptional heart care, our providers are all part of one team.  This team includes your primary Cardiologist (physician) and Advanced Practice Providers or APPs (Physician Assistants and Nurse Practitioners) who all work together to provide you with the care you need, when you need it.  Your next appointment:   3 month(s)  Provider:   Annabella Scarce, MD, Rosaline Bane, NP, or Reche Finder, NP    We recommend signing up for the patient portal called MyChart.  Sign up information is provided on this After Visit Summary.  MyChart is used to connect with patients for Virtual Visits (Telemedicine).  Patients are able to view lab/test results, encounter notes, upcoming appointments, etc.  Non-urgent messages can be sent to your provider as well.   To learn more about what you can do with MyChart, go to [email or web address].   Other Instructions Lexiscan  Myocardial Perfusion Imaging Study.  Please arrive 15 minutes prior to your appointment time for registration and insurance purposes.   The test  will take approximately 3 to 4 hours to complete; you may bring reading material.  If someone comes with you to your appointment, they will need to remain in the main lobby due to limited space in the testing area. **If you are pregnant or breastfeeding, please notify the nuclear lab prior to your appointment**   How to prepare for your Myocardial Perfusion Test: Do not eat or drink 3 hours prior to your test, except you may have water . Do not consume products containing caffeine (regular or decaffeinated) 12 hours prior to your test. (ex: coffee, chocolate, sodas, tea). Do wear comfortable clothes (no dresses or overalls) and walking shoes, tennis shoes preferred (No heels or open toe shoes are allowed). Do NOT wear cologne, perfume, aftershave, or lotions (deodorant is allowed). If you use an inhaler, use it the AM of your test and bring it with you.  If you use a nebulizer, use it the AM of your test.  If these instructions are not followed, your test will have to be rescheduled.

## 2024-10-04 ENCOUNTER — Other Ambulatory Visit: Payer: Self-pay

## 2024-10-04 ENCOUNTER — Ambulatory Visit (HOSPITAL_COMMUNITY)
Admission: RE | Admit: 2024-10-04 | Discharge: 2024-10-04 | Disposition: A | Discharge status: Home / Self Care | Source: Ambulatory Visit | Attending: Cardiovascular Disease | Admitting: Cardiovascular Disease

## 2024-10-04 ENCOUNTER — Ambulatory Visit: Payer: Self-pay | Admitting: Cardiovascular Disease

## 2024-10-04 DIAGNOSIS — R0602 Shortness of breath: Secondary | ICD-10-CM | POA: Diagnosis not present

## 2024-10-04 DIAGNOSIS — R079 Chest pain, unspecified: Secondary | ICD-10-CM | POA: Diagnosis not present

## 2024-10-04 LAB — MYOCARDIAL PERFUSION IMAGING
LV dias vol: 94 mL (ref 62–150)
LV sys vol: 24 mL
Nuc Stress EF: 74 %
Peak HR: 113 {beats}/min
Rest HR: 67 {beats}/min
Rest Nuclear Isotope Dose: 10.9 mCi
SDS: 0
SRS: 0
SSS: 0
ST Depression (mm): 0 mm
Stress Nuclear Isotope Dose: 31.5 mCi
TID: 1.19

## 2024-10-04 MED ORDER — TECHNETIUM TC 99M TETROFOSMIN IV KIT
31.5000 | PACK | Freq: Once | INTRAVENOUS | Status: AC | PRN
  Administered 2024-10-04: 31.5 via INTRAVENOUS

## 2024-10-04 MED ORDER — REGADENOSON 0.4 MG/5ML IV SOLN
0.4000 mg | Freq: Once | INTRAVENOUS | Status: AC
  Administered 2024-10-04: 0.4 mg via INTRAVENOUS

## 2024-10-04 MED ORDER — TECHNETIUM TC 99M TETROFOSMIN IV KIT
10.9000 | PACK | Freq: Once | INTRAVENOUS | Status: AC | PRN
  Administered 2024-10-04: 10.9 via INTRAVENOUS

## 2024-10-04 MED ORDER — REGADENOSON 0.4 MG/5ML IV SOLN
INTRAVENOUS | Status: AC
  Filled 2024-10-04: qty 5

## 2024-10-11 ENCOUNTER — Ambulatory Visit: Payer: Self-pay | Admitting: Cardiovascular Disease

## 2024-10-12 ENCOUNTER — Encounter (HOSPITAL_BASED_OUTPATIENT_CLINIC_OR_DEPARTMENT_OTHER): Payer: Self-pay | Admitting: *Deleted

## 2024-10-13 ENCOUNTER — Telehealth (HOSPITAL_BASED_OUTPATIENT_CLINIC_OR_DEPARTMENT_OTHER): Payer: Self-pay | Admitting: *Deleted

## 2024-10-13 DIAGNOSIS — I10 Essential (primary) hypertension: Secondary | ICD-10-CM

## 2024-10-13 MED ORDER — CHLORTHALIDONE 25 MG PO TABS: ORAL_TABLET | ORAL | Status: AC

## 2024-10-13 NOTE — Addendum Note (Signed)
 Addended by: FREDIRICK NEWELL NOVAK on: 10/13/2024 06:49 PM   Modules accepted: Orders

## 2024-10-13 NOTE — Telephone Encounter (Signed)
 Patient returned RN's call regarding results.

## 2024-10-13 NOTE — Telephone Encounter (Signed)
" °  Annabella Scarce, MD 10/04/2024  3:53 PM EST     Kidney function going in the wrong direction.  Make sure to stay hydrated.  Reduce chlorthalidone  to 12.5 mg.  Check blood pressures at home to make sure that they are staying less than 130/80.  Repeat BMP in 2 months.     Annabella Scarce, MD 10/11/2024 10:36 AM EST     Stress test was normal.  There were some abnormalities in the kidney that are probably the cysts that were seen on prior CT and renal ultrasound.  Continue to follow up with your primary care provider about this.    Patient called back today  Advised of results and sent to PCP   He is concerned that the Chlorthalidone  is too small to cut in half. Will discuss with Dr Scarce and call patient back    "

## 2024-10-13 NOTE — Addendum Note (Signed)
 Addended by: FREDIRICK NEWELL NOVAK on: 10/13/2024 06:29 PM   Modules accepted: Orders

## 2024-10-13 NOTE — Telephone Encounter (Signed)
 Reviewed result with patient  Patient stated the Chlorthalidone  was so small didn't think he could cut in half  Will forward to Dr Raford for review

## 2024-10-13 NOTE — Telephone Encounter (Signed)
 Discussed with Dr Raford and ok to hold Chlorthalidone  if unable to cut in half  Spoke with patient he was able to cut in half today and will continue to try but will hold completely if he unable to continue to break   Mailed lab orders for recheck in 2 months   Patient following up with PCP on 2/9

## 2024-10-17 ENCOUNTER — Other Ambulatory Visit: Payer: Self-pay

## 2024-10-18 ENCOUNTER — Other Ambulatory Visit (HOSPITAL_COMMUNITY): Payer: Self-pay

## 2025-01-06 ENCOUNTER — Ambulatory Visit (HOSPITAL_BASED_OUTPATIENT_CLINIC_OR_DEPARTMENT_OTHER): Admitting: Cardiovascular Disease
